# Patient Record
Sex: Female | Born: 1990
Health system: Southern US, Community
[De-identification: ages and names within clinical notes are randomized; demographics above are authoritative.]

## PROBLEM LIST (undated history)

## (undated) ENCOUNTER — Inpatient Hospital Stay (HOSPITAL_COMMUNITY): Payer: Self-pay

## (undated) DIAGNOSIS — L680 Hirsutism: Secondary | ICD-10-CM

## (undated) DIAGNOSIS — E249 Cushing's syndrome, unspecified: Secondary | ICD-10-CM

## (undated) DIAGNOSIS — E282 Polycystic ovarian syndrome: Secondary | ICD-10-CM

## (undated) DIAGNOSIS — A549 Gonococcal infection, unspecified: Secondary | ICD-10-CM

## (undated) HISTORY — PX: ABSCESS DRAINAGE: SHX1119

## (undated) HISTORY — DX: Hirsutism: L68.0

## (undated) HISTORY — PX: TOOTH EXTRACTION: SUR596

## (undated) HISTORY — PX: THERAPEUTIC ABORTION: SHX798

---
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
## 2009-10-31 ENCOUNTER — Emergency Department (HOSPITAL_COMMUNITY): Admission: EM | Admit: 2009-10-31 | Discharge: 2009-10-31 | Payer: Self-pay | Admitting: Emergency Medicine

## 2009-11-30 ENCOUNTER — Emergency Department (HOSPITAL_COMMUNITY): Admission: EM | Admit: 2009-11-30 | Discharge: 2009-11-30 | Payer: Self-pay | Admitting: Emergency Medicine

## 2010-08-02 ENCOUNTER — Emergency Department (HOSPITAL_COMMUNITY)
Admission: EM | Admit: 2010-08-02 | Discharge: 2010-08-02 | Payer: Self-pay | Source: Home / Self Care | Admitting: Emergency Medicine

## 2010-08-28 ENCOUNTER — Emergency Department (HOSPITAL_COMMUNITY)
Admission: EM | Admit: 2010-08-28 | Discharge: 2010-08-28 | Disposition: A | Discharge status: Home / Self Care | Payer: Self-pay | Attending: Emergency Medicine | Admitting: Emergency Medicine

## 2010-08-28 DIAGNOSIS — M549 Dorsalgia, unspecified: Secondary | ICD-10-CM | POA: Insufficient documentation

## 2010-08-28 DIAGNOSIS — E860 Dehydration: Secondary | ICD-10-CM | POA: Insufficient documentation

## 2010-08-28 DIAGNOSIS — R51 Headache: Secondary | ICD-10-CM | POA: Insufficient documentation

## 2010-08-28 DIAGNOSIS — E669 Obesity, unspecified: Secondary | ICD-10-CM | POA: Insufficient documentation

## 2010-08-28 LAB — URINALYSIS, ROUTINE W REFLEX MICROSCOPIC
Hgb urine dipstick: NEGATIVE
Nitrite: NEGATIVE
Specific Gravity, Urine: 1.029 (ref 1.005–1.030)
pH: 6 (ref 5.0–8.0)

## 2010-08-28 LAB — POCT PREGNANCY, URINE: Preg Test, Ur: NEGATIVE

## 2010-09-21 LAB — RAPID STREP SCREEN (MED CTR MEBANE ONLY): Streptococcus, Group A Screen (Direct): NEGATIVE

## 2010-09-21 LAB — STREP A DNA PROBE

## 2010-10-22 ENCOUNTER — Emergency Department (HOSPITAL_COMMUNITY)
Admission: EM | Admit: 2010-10-22 | Discharge: 2010-10-22 | Discharge status: Against Medical Advice | Payer: Self-pay | Attending: Emergency Medicine | Admitting: Emergency Medicine

## 2010-10-22 DIAGNOSIS — M549 Dorsalgia, unspecified: Secondary | ICD-10-CM | POA: Insufficient documentation

## 2010-10-22 DIAGNOSIS — M545 Low back pain, unspecified: Secondary | ICD-10-CM | POA: Insufficient documentation

## 2010-10-22 LAB — URINALYSIS, ROUTINE W REFLEX MICROSCOPIC
Bilirubin Urine: NEGATIVE
Glucose, UA: NEGATIVE mg/dL
Hgb urine dipstick: NEGATIVE
Ketones, ur: NEGATIVE mg/dL
Nitrite: NEGATIVE
pH: 6 (ref 5.0–8.0)

## 2010-10-22 LAB — POCT PREGNANCY, URINE: Preg Test, Ur: NEGATIVE

## 2010-11-17 NOTE — Op Note (Signed)
NAME:  EEVIE, LAPP               ACCOUNT NO.:  [id number]   MEDICAL RECORD NO.:  [id number]          PATIENT TYPE:  AMB   LOCATION:  DAY                          FACILITY:  [facility]   PHYSICIAN:  Anselm Pancoast. Weatherly, M.D.DATE OF BIRTH:  01-05-1991   DATE OF PROCEDURE:  05/02/2008  DATE OF DISCHARGE:                               OPERATIVE REPORT   PREOPERATIVE DIAGNOSIS:  Bilateral axillary hidradenitis, right worse  than left.   OPERATION:  Excision of bilateral hidradenitis with closure with Blake  drains.   ANESTHESIA:  General anesthesia.   SURGEON:  Dr. Anselm Pancoast. Weatherly.   HISTORY:  [name] [name] is an about 12 pound 20 year old black female  who I saw approximately a month ago.  She has had problems with drain  and hidradenitis on each axilla.  The area on the right has drained.  The area on the left has never actually drained, but has been tender and  swollen, and she has been on several rounds of antibiotics.  The last  culture that I did in the office screw a Proteus that was sensitive to  Keflex and basically all antibiotics.  I first saw her and she would not  let you do any type of I and D in the office, but I was able to get a  little culture that was spontaneously draining and this showed the  Proteus.  She has been on Septra.  The area has gotten better, but not  completely well and her mother desires that the area be completely  excised.  She is here today for the planned procedure.  I had her  scheduled for Tuesday, but because of an emergency case at [facility], I  had to cancel her and added her to the schedule for today.  She is  otherwise in good health with exception of being overweight.  She is not  a diabetic and her lab studies preoperatively showed a white count of  6800, hematocrit 36, and her sugar is normal.   DESCRIPTION OF PROCEDURE:  The patient preoperatively was given a gram  of Ancef and taken to the operative suite.  Induction of general  anesthesia, endotracheal tube.  We had her up with her arms extended  bilaterally.  She is difficult to position because of her size.  We  prepped both sides with Betadine solution and draped in a sterile  manner.  On the right, she has got 4-5 little spontaneously draining  areas with no actual big fluctuant areas.  On the left, there is an area  about the size of a walnut and then two smaller areas that look like  that it has drained in the past, but I have no history of exactly when  it actually had drained spontaneously in the past.  We started on the  right side.  I made sort of a smile incision.  The total length of the  incision was about 4 inches on the right, probably about 3 on the left,  and then with elevating the skin went ahead and excised all the palpable  area.  It is a deep incision, but it is only because of her size.  We  were not down in the true axillary contents, but just the skin and  subcutaneous tissue in the superficial areas.  Hemostasis was obtained  with cautery and also 4-0 Vicryl sutures.  After the area had been  completely excised, I then closed the skin after closing the  subcutaneous tissue with 4-0 Vicryl.  I placed a Blake drain that comes  out laterally and then closed the skin with mattress sutures of 3-0  nylon and then skin staples on the skin.  Will get the staples out in  the office next week.  The Blake drain was sutured to the skin with 3-0  nylon.  Next on the left side was similar, the incision is just a little  bit shorter and the area that was palpable was the size of a big walnut  area.  Then after it had been completely excised, there was a little  more vascularity on the left and more sutures of 3-0 Vicryl for  hemostasis, and then closed the skin in a similar manner with 4-0 Vicryl  subcutaneous.  Blake drain brought out laterally and then the skin  sutures, mattress of 3-0 nylon and then the staples.  Sterile occlusive  dressing  complied of Vaseline gauze, 4x4s and Hypafix was placed.  We  then opened the abscess on the back and there was probably an ounce of  frank pus that smells to me sort of like Proteus, which is what she has  cultured previously.  As far as on her antibiotics, if this is Proteus,  Keflex would probably be more sensitive.  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  We will pick the best antibiotic after we  see the results of this culture.  She will be released after a short  stay in the recovery room.  Her mother will be managing the drains.  She  was referred to [place] by [facility].           ______________________________  Anselm Pancoast. Zachery Dakins, M.D.     WJW/MEDQ  D:  05/02/2008  T:  05/02/2008  Job:  [id number]

## 2011-01-21 ENCOUNTER — Emergency Department (HOSPITAL_COMMUNITY)
Admission: EM | Admit: 2011-01-21 | Discharge: 2011-01-21 | Disposition: A | Discharge status: Home / Self Care | Payer: Self-pay | Attending: Emergency Medicine | Admitting: Emergency Medicine

## 2011-01-21 DIAGNOSIS — B9689 Other specified bacterial agents as the cause of diseases classified elsewhere: Secondary | ICD-10-CM | POA: Insufficient documentation

## 2011-01-21 DIAGNOSIS — N760 Acute vaginitis: Secondary | ICD-10-CM | POA: Insufficient documentation

## 2011-01-21 DIAGNOSIS — A499 Bacterial infection, unspecified: Secondary | ICD-10-CM | POA: Insufficient documentation

## 2011-01-21 LAB — URINALYSIS, ROUTINE W REFLEX MICROSCOPIC
Glucose, UA: NEGATIVE mg/dL
Leukocytes, UA: NEGATIVE
Nitrite: NEGATIVE
Specific Gravity, Urine: 1.014 (ref 1.005–1.030)
pH: 7.5 (ref 5.0–8.0)

## 2011-01-21 LAB — WET PREP, GENITAL
Trich, Wet Prep: NONE SEEN
Yeast Wet Prep HPF POC: NONE SEEN

## 2011-04-06 LAB — CBC
HCT: 36
MCHC: 33.7
MCV: 93.1
Platelets: 226

## 2011-04-06 LAB — CULTURE, ROUTINE-ABSCESS

## 2011-04-06 LAB — DIFFERENTIAL
Basophils Relative: 1
Eosinophils Absolute: 0.1
Eosinophils Relative: 2
Neutrophils Relative %: 57

## 2011-04-06 LAB — PREGNANCY, URINE: Preg Test, Ur: NEGATIVE

## 2011-04-09 LAB — RAPID STREP SCREEN (MED CTR MEBANE ONLY): Streptococcus, Group A Screen (Direct): NEGATIVE

## 2011-07-15 ENCOUNTER — Encounter (HOSPITAL_COMMUNITY): Payer: Self-pay | Admitting: *Deleted

## 2011-07-15 ENCOUNTER — Emergency Department (HOSPITAL_COMMUNITY)
Admission: EM | Admit: 2011-07-15 | Discharge: 2011-07-15 | Disposition: A | Discharge status: Home / Self Care | Payer: Self-pay | Attending: Emergency Medicine | Admitting: Emergency Medicine

## 2011-07-15 DIAGNOSIS — N390 Urinary tract infection, site not specified: Secondary | ICD-10-CM | POA: Insufficient documentation

## 2011-07-15 DIAGNOSIS — M545 Low back pain, unspecified: Secondary | ICD-10-CM | POA: Insufficient documentation

## 2011-07-15 LAB — URINALYSIS, ROUTINE W REFLEX MICROSCOPIC
Glucose, UA: NEGATIVE mg/dL
Ketones, ur: NEGATIVE mg/dL
Nitrite: NEGATIVE
Protein, ur: NEGATIVE mg/dL
pH: 6 (ref 5.0–8.0)

## 2011-07-15 LAB — URINE MICROSCOPIC-ADD ON

## 2011-07-15 LAB — POCT PREGNANCY, URINE: Preg Test, Ur: NEGATIVE

## 2011-07-15 MED ORDER — NAPROXEN 500 MG PO TABS: 500.0000 mg | ORAL_TABLET | Freq: Two times a day (BID) | ORAL | Status: DC

## 2011-07-15 MED ORDER — KETOROLAC TROMETHAMINE 60 MG/2ML IM SOLN
60.0000 mg | Freq: Once | INTRAMUSCULAR | Status: AC
  Administered 2011-07-15: 60 mg via INTRAMUSCULAR
  Filled 2011-07-15: qty 2

## 2011-07-15 MED ORDER — METHOCARBAMOL 500 MG PO TABS: 500.0000 mg | ORAL_TABLET | Freq: Two times a day (BID) | ORAL | Status: AC | PRN

## 2011-07-15 MED ORDER — CIPROFLOXACIN HCL 500 MG PO TABS: 500.0000 mg | ORAL_TABLET | Freq: Two times a day (BID) | ORAL | Status: AC

## 2011-07-15 NOTE — ED Provider Notes (Signed)
History     CSN: [id number]  Arrival date & time 07/15/11  0244   First MD Initiated Contact with Patient 07/15/11 0544      Chief Complaint  Patient presents with  . Back Pain    (Consider location/radiation/quality/duration/timing/severity/associated sxs/prior treatment) HPI Comments: 21 year old female with no significant past medical history presents after having approximately 3 days of gradual onset, persistent low back pain. This is worse when she bends over and worse when she tries to sit up straight but is able to ambulate without difficulty. She has no red flags for pathologic back pain including IV drug use, fevers, numbness weakness or incontinence. Symptoms are persistent, mild at this time. She has had over the counter medication such as Tylenol and ibuprofen prior to arrival with minimal relief.    Patient is a 21 y.o. female presenting with back pain. The history is provided by the patient.  Back Pain  Pertinent negatives include no fever, no numbness and no weakness.    History reviewed. No pertinent past medical history.  History reviewed. No pertinent past surgical history.  No family history on file.  History  Substance Use Topics  . Smoking status: Passive Smoker  . Smokeless tobacco: Not on file  . Alcohol Use: Yes     occasionally    OB History    Grav Para Term Preterm Abortions TAB SAB Ect Mult Living                  Review of Systems  Constitutional: Negative for fever and chills.  HENT: Negative for neck pain.   Gastrointestinal: Negative for nausea, vomiting and diarrhea.  Genitourinary: Negative for difficulty urinating.  Musculoskeletal: Positive for back pain.  Skin: Negative for rash.  Neurological: Negative for weakness and numbness.    Allergies  Review of patient's allergies indicates no known allergies.  Home Medications   Current Outpatient Rx  Name Route Sig Dispense Refill  . METHOCARBAMOL 500 MG PO TABS Oral Take 1  tablet (500 mg total) by mouth 2 (two) times daily as needed. 20 tablet 0  . NAPROXEN 500 MG PO TABS Oral Take 1 tablet (500 mg total) by mouth 2 (two) times daily with a meal. 30 tablet 0    BP 125/68  Pulse 95  Temp(Src) 99.5 F (37.5 C) (Oral)  Resp 16  LMP 03/06/2011  Physical Exam  Nursing note and vitals reviewed. Constitutional: She appears well-developed and well-nourished. No distress.  HENT:  Head: Normocephalic and atraumatic.  Eyes: Conjunctivae are normal. No scleral icterus.  Cardiovascular: Normal rate, regular rhythm and intact distal pulses.   Pulmonary/Chest: Effort normal and breath sounds normal.  Musculoskeletal: She exhibits tenderness ( Minimal tenderness in the lower back paraspinal muscles.). She exhibits no edema.  Neurological: She is alert.       Normal gait, reflexes of the lower extremities, sensation and motor of the lower extremities.  Skin: Skin is warm and dry. No rash noted. She is not diaphoretic.    ED Course  Procedures (including critical care time)  Labs Reviewed  URINALYSIS, ROUTINE W REFLEX MICROSCOPIC - Abnormal; Notable for the following:    Leukocytes, UA SMALL (*)    All other components within normal limits  URINE MICROSCOPIC-ADD ON - Abnormal; Notable for the following:    Squamous Epithelial / LPF FEW (*)    Bacteria, UA FEW (*)    All other components within normal limits  POCT PREGNANCY, URINE  POCT PREGNANCY, URINE  No results found.   1. Low back pain       MDM  Patient has ongoing mild low back pain, no significant abnormal vital signs,  also has what appears to be a mild urinary tract infection. I will treat for UTI, have patient followup as needed. Intramuscular Toradol given prior to discharge  Discharge prescriptions  #1 ciprofloxacin #2 Robaxin #3 Naprosyn  Patient appears stable for discharge        Vida Roller, MD 07/15/11 [phone]

## 2011-07-15 NOTE — ED Notes (Signed)
Pt states her middle/lower back started hurting 2-3 days ago. Denies nausea, vomiting with the pain. Denies burning or painful urination. Does admit to unprotected sex and was diagnosed with an STD back in July. However, pt states back pain feels different than when she was here in July. States pain is 10/10 at this time.

## 2011-07-15 NOTE — ED Notes (Signed)
Pt reports lower back pain x2-3 days - states she has been taking OTC pain medications w/o relief - pain is worse when she lays down or tries to get up. Pt in no acute distress on assessment. Pt denies any known mechanism of injury.

## 2011-09-20 ENCOUNTER — Emergency Department (HOSPITAL_COMMUNITY)
Admission: EM | Admit: 2011-09-20 | Discharge: 2011-09-20 | Disposition: A | Discharge status: Home / Self Care | Payer: BC Managed Care – PPO | Attending: Emergency Medicine | Admitting: Emergency Medicine

## 2011-09-20 ENCOUNTER — Encounter (HOSPITAL_COMMUNITY): Payer: Self-pay | Admitting: *Deleted

## 2011-09-20 DIAGNOSIS — L0291 Cutaneous abscess, unspecified: Secondary | ICD-10-CM

## 2011-09-20 DIAGNOSIS — N61 Mastitis without abscess: Secondary | ICD-10-CM | POA: Insufficient documentation

## 2011-09-20 MED ORDER — SULFAMETHOXAZOLE-TRIMETHOPRIM 800-160 MG PO TABS: 1.0000 | ORAL_TABLET | Freq: Two times a day (BID) | ORAL | Status: AC

## 2011-09-20 NOTE — Discharge Instructions (Signed)
WARM COMPRESSES TO THE AREA. TAKE ANTIBIOTICS AS PRESCRIBED. RETURN IN 48 HOURS FOR RECHECK AND ANY FURTHER NEEDED TREATMENT.   Abscess An abscess (boil or furuncle) is an infected area that contains a collection of pus.  SYMPTOMS Signs and symptoms of an abscess include pain, tenderness, redness, or hardness. You may feel a moveable soft area under your skin. An abscess can occur anywhere in the body.  TREATMENT  A surgical cut (incision) may be made over your abscess to drain the pus. Gauze may be packed into the space or a drain may be looped through the abscess cavity (pocket). This provides a drain that will allow the cavity to heal from the inside outwards. The abscess may be painful for a few days, but should feel much better if it was drained.  Your abscess, if seen early, may not have localized and may not have been drained. If not, another appointment may be required if it does not get better on its own or with medications. HOME CARE INSTRUCTIONS   Only take over-the-counter or prescription medicines for pain, discomfort, or fever as directed by your caregiver.   Take your antibiotics as directed if they were prescribed. Finish them even if you start to feel better.   Keep the skin and clothes clean around your abscess.   If the abscess was drained, you will need to use gauze dressing to collect any draining pus. Dressings will typically need to be changed 3 or more times a day.   The infection may spread by skin contact with others. Avoid skin contact as much as possible.   Practice good hygiene. This includes regular hand washing, cover any draining skin lesions, and do not share personal care items.   If you participate in sports, do not share athletic equipment, towels, whirlpools, or personal care items. Shower after every practice or tournament.   If a draining area cannot be adequately covered:   Do not participate in sports.   Children should not participate in day care  until the wound has healed or drainage stops.   If your caregiver has given you a follow-up appointment, it is very important to keep that appointment. Not keeping the appointment could result in a much worse infection, chronic or permanent injury, pain, and disability. If there is any problem keeping the appointment, you must call back to this facility for assistance.  SEEK MEDICAL CARE IF:   You develop increased pain, swelling, redness, drainage, or bleeding in the wound site.   You develop signs of generalized infection including muscle aches, chills, fever, or a general ill feeling.   You have an oral temperature above 102 F (38.9 C).  MAKE SURE YOU:   Understand these instructions.   Will watch your condition.   Will get help right away if you are not doing well or get worse.  Document Released: 03/31/2005 Document Revised: 06/10/2011 Document Reviewed: 01/23/2008 [facility] Patient Information 2012 [place], [place].

## 2011-09-20 NOTE — ED Provider Notes (Signed)
History     CSN: [id number]  Arrival date & time 09/20/11  1800   First MD Initiated Contact with Patient 09/20/11 2115      Chief Complaint  Patient presents with  . Abscess    (Consider location/radiation/quality/duration/timing/severity/associated sxs/prior treatment) Patient is a 21 y.o. female presenting with abscess. The history is provided by the patient.  Abscess  This is a new problem. The current episode started more than one week ago. Affected Location: Abscess to right breast that has intermittently opened and drained over two weeks. The abscess is characterized by painfulness and draining. Pertinent negatives include no fever. There were no sick contacts.    History reviewed. No pertinent past medical history.  History reviewed. No pertinent past surgical history.  No family history on file.  History  Substance Use Topics  . Smoking status: Passive Smoker  . Smokeless tobacco: Not on file  . Alcohol Use: Yes     occasionally    OB History    Grav Para Term Preterm Abortions TAB SAB Ect Mult Living                  Review of Systems  Constitutional: Negative for fever and chills.  HENT: Negative.   Respiratory: Negative.   Cardiovascular: Negative.   Gastrointestinal: Negative.  Negative for nausea.  Musculoskeletal: Negative.   Skin: Negative.        C/O Abscess.  Neurological: Negative.     Allergies  Review of patient's allergies indicates no known allergies.  Home Medications   Current Outpatient Rx  Name Route Sig Dispense Refill  . NAPROXEN 500 MG PO TABS Oral Take 1 tablet (500 mg total) by mouth 2 (two) times daily with a meal. 30 tablet 0    BP 119/70  Pulse 81  Temp(Src) 99.6 F (37.6 C) (Oral)  Resp 20  Wt 312 lb (141.522 kg)  SpO2 100%  LMP 07/16/2010  Physical Exam  Constitutional: She is oriented to person, place, and time. She appears well-developed and well-nourished.  Neck: Normal range of motion.  Pulmonary/Chest:  Effort normal.  Neurological: She is alert and oriented to person, place, and time.  Skin: Skin is warm and dry.       2cm circular area medial right breast at sternum with active drainage. Minimally tender. No induration.     ED Course  Procedures (including critical care time)  Labs Reviewed - No data to display No results found.   No diagnosis found.  1. Cutaneous abscess.    MDM  Site is opened and draining, minimal tenderness. Do not feel I&D would provide greater benefit. Abx, warm compresses and recheck if no better in 48 hours.        Rodena Medin, PA-C 09/20/11 2155

## 2011-09-20 NOTE — ED Notes (Signed)
Pt states she has an abscess on her right breast. Pt states abscess comes and goes. Redness and drainage noted to the abscess.

## 2011-09-20 NOTE — ED Notes (Signed)
Pt returned to window stating she had stepped outside.

## 2011-09-21 NOTE — ED Provider Notes (Signed)
Medical screening examination/treatment/procedure(s) were performed by non-physician practitioner and as supervising physician I was immediately available for consultation/collaboration. Devoria Albe, MD, FACEP   Ward Givens, MD 09/21/11 [phone]

## 2011-09-23 ENCOUNTER — Encounter (HOSPITAL_COMMUNITY): Payer: Self-pay | Admitting: Emergency Medicine

## 2011-09-23 ENCOUNTER — Emergency Department (HOSPITAL_COMMUNITY)
Admission: EM | Admit: 2011-09-23 | Discharge: 2011-09-23 | Disposition: A | Discharge status: Home / Self Care | Payer: BC Managed Care – PPO | Attending: Emergency Medicine | Admitting: Emergency Medicine

## 2011-09-23 DIAGNOSIS — F17200 Nicotine dependence, unspecified, uncomplicated: Secondary | ICD-10-CM | POA: Insufficient documentation

## 2011-09-23 DIAGNOSIS — K0889 Other specified disorders of teeth and supporting structures: Secondary | ICD-10-CM

## 2011-09-23 DIAGNOSIS — K089 Disorder of teeth and supporting structures, unspecified: Secondary | ICD-10-CM | POA: Insufficient documentation

## 2011-09-23 MED ORDER — PENICILLIN V POTASSIUM 500 MG PO TABS: 500.0000 mg | ORAL_TABLET | Freq: Four times a day (QID) | ORAL | Status: AC

## 2011-09-23 MED ORDER — OXYCODONE-ACETAMINOPHEN 5-325 MG PO TABS: 1.0000 | ORAL_TABLET | Freq: Four times a day (QID) | ORAL | Status: AC | PRN

## 2011-09-23 NOTE — ED Provider Notes (Signed)
Medical screening examination/treatment/procedure(s) were performed by non-physician practitioner and as supervising physician I was immediately available for consultation/collaboration.  Ethelda Chick, MD 09/23/11 [phone]

## 2011-09-23 NOTE — Discharge Instructions (Signed)
You have a dental injury. Use the resource guide listed below to help you find a dentist if you do not already have one to followup with. It is very important that you get evaluated by a dentist as soon as possible. Call tomorrow to schedule an appointment. Use your pain medication as prescribed and do not operate heavy machinery while on pain medication. Note that your pain medication contains acetaminophen (Tylenol) & its is not recommended that you use additional acetaminophen (Tylenol) while taking this medication. Take your full course of antibiotics. Read the instructions below. ° °Eat a soft or liquid diet and rinse your mouth out after meals with warm water. You should see a dentist or return here at once if you have increased swelling, increased pain or uncontrolled bleeding from the site of your injury. ° ° °SEEK MEDICAL CARE IF:  °· You have increased pain not controlled with medicines.  °· You have swelling around your tooth, in your face or neck.  °· You have bleeding which starts, continues, or gets worse.  °· You have a fever >101 °· If you are unable to open your mouth ° °RESOURCE GUIDE ° °Dental Problems ° °Patients with Medicaid: °[facility]                     [facility] °[address]                                           [address] °Phone:  [phone]                                                  Phone:  510-2600 ° °If unable to pay or uninsured, contact:  [facility] or [facility] to become qualified for the adult dental clinic. ° °Chronic Pain Problems °Contact [facility]  297-2271 °Patients need to be referred by their primary care doctor. ° °Insufficient Money for Medicine °Contact [employer]:  call "211" or [facility] 271-5999. ° °No Primary Care Doctor °Call [facility]  832-8000 °Other agencies that provide inexpensive medical care °   [facility]  [phone] °   [facility]  832-7272 °   [facility]  [phone] °   [facility]  832-4777 °   [facility]  373-0678 °   [facility]  272-1050 ° °Psychological Services °[facility]  [phone] °[facility]  [phone] °[facility]   800 853-5163 (emergency services 641-4993) ° °Substance Abuse Resources °[facility]  336-882-2125 °[facility] 336-784-9470 °[facility] 336-285-9073 °[facility] 336-845-3988 °Residential & Outpatient Substance Abuse Program  800-659-3381 ° °Abuse/Neglect °[place] [facility] (336) 641-3795 °[place] [facility] Hotline 800-378-5315 (After Hours) ° °Emergency Shelter °[facility] (336) 271-5985 ° °Maternity Homes °[facility] (336) 275-9566 °[facility] (704) 372-4663 ° °MRSA Hotline #:   832-7006 ° ° ° °[place] [facility] ° °[facility] [place]     [employer]                            [facility] °[address] [place]                       [address]      [address]  °[place]                                                [place]                            [place] °Phone:  349-3220                                   Phone:  [phone]                 Phone:  342-8140 ° °[facility] °Phone:  342-8316 ° °[place] [facility] Hotline °(336) 342-1394 °(336) 342-3537 (After Hours) ° ° ° ° °

## 2011-09-23 NOTE — ED Provider Notes (Signed)
History     CSN: [id number]  Arrival date & time 09/23/11  1448   First MD Initiated Contact with Patient 09/23/11 1508      Chief Complaint  Patient presents with  . Dental Injury    (Consider location/radiation/quality/duration/timing/severity/associated sxs/prior treatment) HPI Comments: Patient reports that her tooth had been broken for months.  Today when she was eating potato chips another piece of the tooth had broken off.  She is now having pain in that tooth.  Tooth pain located on the left upper molar tooth.  She describes the pain as a throbbing pain.  Patient is a 21 y.o. female presenting with dental injury. The history is provided by the patient.  Dental Injury This is a new problem. The current episode started today. The problem occurs constantly. Pertinent negatives include no chills, fever, neck pain, numbness or vomiting. Exacerbated by: palpation. She has tried nothing for the symptoms.    History reviewed. No pertinent past medical history.  History reviewed. No pertinent past surgical history.  No family history on file.  History  Substance Use Topics  . Smoking status: Passive Smoker  . Smokeless tobacco: Not on file  . Alcohol Use: Yes     occasionally    OB History    Grav Para Term Preterm Abortions TAB SAB Ect Mult Living                  Review of Systems  Constitutional: Negative for fever and chills.  HENT: Negative for facial swelling, drooling, trouble swallowing, neck pain and neck stiffness.   Gastrointestinal: Negative for vomiting.  Neurological: Negative for numbness.    Allergies  Review of patient's allergies indicates no known allergies.  Home Medications   Current Outpatient Rx  Name Route Sig Dispense Refill  . SULFAMETHOXAZOLE-TRIMETHOPRIM 800-160 MG PO TABS Oral Take 1 tablet by mouth every 12 (twelve) hours. 14 tablet 0    BP 123/77  Pulse 86  Temp(Src) 98.6 F (37 C) (Oral)  Resp 18  SpO2 95%  LMP  07/16/2010  Physical Exam  Nursing note and vitals reviewed. Constitutional: She appears well-developed and well-nourished. No distress.  HENT:  Head: Normocephalic and atraumatic. No trismus in the jaw.  Mouth/Throat: Uvula is midline, oropharynx is clear and moist and mucous membranes are normal. Abnormal dentition. No dental abscesses or uvula swelling. No oropharyngeal exudate, posterior oropharyngeal edema, posterior oropharyngeal erythema or tonsillar abscesses.         Poor dental hygiene. Pt able to open and close mouth with out difficulty. Airway intact. Uvula midline. Mild gingival swelling with tenderness over affected area, but no fluctuance. No swelling or tenderness of submental and submandibular regions. No facial swelling.  Eyes: Conjunctivae and EOM are normal.  Neck: Normal range of motion and full passive range of motion without pain. Neck supple.  Cardiovascular: Normal rate, regular rhythm and normal heart sounds.   Pulmonary/Chest: Effort normal and breath sounds normal. No respiratory distress. She has no wheezes.  Lymphadenopathy:       Head (right side): No submental, no submandibular, no tonsillar, no preauricular and no posterior auricular adenopathy present.       Head (left side): No submental, no submandibular, no tonsillar, no preauricular and no posterior auricular adenopathy present.    She has no cervical adenopathy.  Neurological: She is alert.  Skin: Skin is warm and dry. No rash noted. She is not diaphoretic.    ED Course  Procedures (including critical care  time)  Labs Reviewed - No data to display No results found.   1. Pain, dental       MDM  Patient with toothache.  No obvious distress.  No gross abscess.  Exam unconcerning for Ludwig's angina or spread of infection.  Will treat with penicillin and pain medicine.  Urged patient to follow-up with dentist.          Pascal Lux [place], PA-C 09/23/11 1554

## 2011-09-23 NOTE — ED Notes (Signed)
Pt was eating chips today and a tooth on her Upper R side that had a hole in it before broke off and now she is in pain because of this.

## 2011-11-29 ENCOUNTER — Emergency Department (HOSPITAL_COMMUNITY)
Admission: EM | Admit: 2011-11-29 | Discharge: 2011-11-29 | Disposition: A | Discharge status: Home / Self Care | Payer: BC Managed Care – PPO | Attending: Emergency Medicine | Admitting: Emergency Medicine

## 2011-11-29 ENCOUNTER — Encounter (HOSPITAL_COMMUNITY): Payer: Self-pay

## 2011-11-29 DIAGNOSIS — M545 Low back pain, unspecified: Secondary | ICD-10-CM | POA: Insufficient documentation

## 2011-11-29 DIAGNOSIS — E669 Obesity, unspecified: Secondary | ICD-10-CM | POA: Insufficient documentation

## 2011-11-29 DIAGNOSIS — N898 Other specified noninflammatory disorders of vagina: Secondary | ICD-10-CM

## 2011-11-29 DIAGNOSIS — A499 Bacterial infection, unspecified: Secondary | ICD-10-CM | POA: Insufficient documentation

## 2011-11-29 DIAGNOSIS — N760 Acute vaginitis: Secondary | ICD-10-CM | POA: Insufficient documentation

## 2011-11-29 DIAGNOSIS — B9689 Other specified bacterial agents as the cause of diseases classified elsewhere: Secondary | ICD-10-CM | POA: Insufficient documentation

## 2011-11-29 DIAGNOSIS — R109 Unspecified abdominal pain: Secondary | ICD-10-CM | POA: Insufficient documentation

## 2011-11-29 DIAGNOSIS — M549 Dorsalgia, unspecified: Secondary | ICD-10-CM

## 2011-11-29 LAB — URINALYSIS, ROUTINE W REFLEX MICROSCOPIC
Leukocytes, UA: NEGATIVE
Nitrite: NEGATIVE
Specific Gravity, Urine: 1.029 (ref 1.005–1.030)
Urobilinogen, UA: 2 mg/dL — ABNORMAL HIGH (ref 0.0–1.0)
pH: 6.5 (ref 5.0–8.0)

## 2011-11-29 LAB — COMPREHENSIVE METABOLIC PANEL
AST: 13 U/L (ref 0–37)
Albumin: 3.8 g/dL (ref 3.5–5.2)
Alkaline Phosphatase: 91 U/L (ref 39–117)
Chloride: 100 mEq/L (ref 96–112)
Potassium: 4.3 mEq/L (ref 3.5–5.1)
Sodium: 136 mEq/L (ref 135–145)
Total Bilirubin: 0.3 mg/dL (ref 0.3–1.2)
Total Protein: 8 g/dL (ref 6.0–8.3)

## 2011-11-29 LAB — CBC
HCT: 37.6 % (ref 36.0–46.0)
MCH: 30.7 pg (ref 26.0–34.0)
MCV: 93.1 fL (ref 78.0–100.0)
Platelets: 173 10*3/uL (ref 150–400)
RDW: 12.3 % (ref 11.5–15.5)

## 2011-11-29 LAB — WET PREP, GENITAL: Yeast Wet Prep HPF POC: NONE SEEN

## 2011-11-29 MED ORDER — NAPROXEN 500 MG PO TABS: 500.0000 mg | ORAL_TABLET | Freq: Two times a day (BID) | ORAL | Status: DC

## 2011-11-29 MED ORDER — KETOROLAC TROMETHAMINE 60 MG/2ML IM SOLN
60.0000 mg | Freq: Once | INTRAMUSCULAR | Status: AC
  Administered 2011-11-29: 60 mg via INTRAMUSCULAR
  Filled 2011-11-29: qty 2

## 2011-11-29 MED ORDER — METRONIDAZOLE 500 MG PO TABS: 500.0000 mg | ORAL_TABLET | Freq: Two times a day (BID) | ORAL | Status: AC

## 2011-11-29 NOTE — ED Notes (Signed)
Pt presents with no acute distress.  Rt flank pain and vaginal discharge for several days .  Denies pain with urination. Denies N/V/D and fever

## 2011-11-29 NOTE — Discharge Instructions (Signed)
Pelvic Infection ° °If you have been diagnosed with a pelvic infection such as a sexually transmitted disease, you will need to be treated with antibiotics. Please take the medicines as prescribed. Some of these tests do not come back for 1-2 days in which case if they turn positive you will receive a phone call to let you know. If you are contacted and do have an infection consistent with a sexually transmitted disease, then you will need to tell any and all sexual partners that you have had in the last 6 months no so that they can be tested and treated as well. If you should develop severe or worsening pain in your abdomen or the pelvis or develop severe fevers,nausea or vomiting that prevent you from taking your medications, return to the emergency department immediately. Otherwise contact your local physician or county health department for a follow up appointment to complete STD testing including HIV and syphilis.  See the list of phone numbers below. ° °RESOURCE GUIDE ° °Dental Problems ° °Patients with Medicaid: °[facility]                     [facility] °[address]                                           [address] °Phone:  [phone]                                                  Phone:  [phone] ° °If unable to pay or uninsured, contact:  [facility] or [facility] to become qualified for the adult dental clinic. ° °Chronic Pain Problems °Contact [facility]  [phone] °Patients need to be referred by their primary care doctor. ° °Insufficient Money for Medicine °Contact [employer]:  call "211" or [facility] 271-5999. ° °No Primary Care Doctor °Call [facility]  832-8000 °Other agencies that provide inexpensive medical care °   [facility]  [phone] °   [facility]  [phone] °   [facility]  [phone] °   [facility]  [phone] °   [facility]  [phone] ° [facility]  [phone] ° °Psychological Services °[facility]  [phone] °[facility]  [phone] °[facility]   [phone] (emergency services [phone]) ° °Substance Abuse Resources °[facility]  336-882-2125 °[facility] 336-784-9470 °[facility] 336-285-9073 °[facility] 336-845-3988 °Residential & Outpatient Substance Abuse Program  800-659-3381 ° °Abuse/Neglect °[place] [facility] (336) 641-3795 °[place] [facility] 800-378-5315 (After Hours) ° °Emergency Shelter °[facility] (336) 271-5985 ° °Maternity Homes °[facility] (336) 275-9566 °[facility] (704) 372-4663 ° °MRSA Hotline #:   832-7006 ° ° ° °[place] [facility] ° °[facility] [facility]     [employer]                          [facility] °[address] [place]                         [address]      [address]  °                                                [place]                            [place] °Phone:  [phone]                                   Phone:  [phone]                 Phone:  342-8140 ° °[facility] °Phone:  342-8316 ° °[place] [facility] °(336) 342-1394 °(336) 342-3537 (After Hours) ° ° ° °

## 2011-11-29 NOTE — ED Provider Notes (Signed)
History     CSN: [id number]  Arrival date & time 11/29/11  0003   First MD Initiated Contact with Patient 11/29/11 [phone]      Chief Complaint  Patient presents with  . Back Pain  . Flank Pain  . Vaginal Discharge    odor with clear discharge    (Consider location/radiation/quality/duration/timing/severity/associated sxs/prior treatment) HPI Comments: 21 year old female with history of obesity and intermittent back pain as well as a history of gonorrhea one year ago presents with 2 complaints  #1 back pain - 3 of waxing and waning back pain over the last several years, over the last 2 days she has had lower back pain which is nonspecific, not persistent, intermittent, worse with movement and palpation and not associated with red flags for bad back pain. She said no medication for this prior to arrival  #2 vaginal discharge - as noted approximately 4 days of increased vaginal discharge which is malodorous and light in color. She does have a sexual partner, is not using any protection and is not on any birth control. She denies fevers chills abdominal pain dysuria diarrhea.  Patient is a 21 y.o. female presenting with back pain, flank pain, and vaginal discharge. The history is provided by the patient.  Back Pain  Pertinent negatives include no fever and no dysuria.  Flank Pain  Vaginal Discharge    History reviewed. No pertinent past medical history.  History reviewed. No pertinent past surgical history.  No family history on file.  History  Substance Use Topics  . Smoking status: Passive Smoker  . Smokeless tobacco: Not on file  . Alcohol Use: Yes     occasionally    OB History    Grav Para Term Preterm Abortions TAB SAB Ect Mult Living                  Review of Systems  Constitutional: Negative for fever.  Gastrointestinal: Negative for nausea and vomiting.  Genitourinary: Positive for vaginal discharge. Negative for dysuria.  Musculoskeletal: Positive for  back pain.    Allergies  Review of patient's allergies indicates no known allergies.  Home Medications   Current Outpatient Rx  Name Route Sig Dispense Refill  . METRONIDAZOLE 500 MG PO TABS Oral Take 1 tablet (500 mg total) by mouth 2 (two) times daily. 14 tablet 0  . NAPROXEN 500 MG PO TABS Oral Take 1 tablet (500 mg total) by mouth 2 (two) times daily with a meal. 30 tablet 0    BP 121/70  Pulse 82  Temp(Src) 98.2 F (36.8 C) (Oral)  Resp 22  Ht 5\' 6"  (1.676 m)  Wt 318 lb (144.244 kg)  BMI 51.33 kg/m2  SpO2 99%  LMP 11/29/2011  Physical Exam  Nursing note and vitals reviewed. Constitutional: She appears well-developed and well-nourished. No distress.  HENT:  Head: Normocephalic and atraumatic.  Mouth/Throat: Oropharynx is clear and moist. No oropharyngeal exudate.  Eyes: Conjunctivae and EOM are normal. Pupils are equal, round, and reactive to light. Right eye exhibits no discharge. Left eye exhibits no discharge. No scleral icterus.  Neck: Normal range of motion. Neck supple. No JVD present. No thyromegaly present.  Cardiovascular: Normal rate, regular rhythm, normal heart sounds and intact distal pulses.  Exam reveals no gallop and no friction rub.   No murmur heard. Pulmonary/Chest: Effort normal and breath sounds normal. No respiratory distress. She has no wheezes. She has no rales.  Abdominal: Soft. Bowel sounds are normal. She exhibits no  distension and no mass. There is no tenderness.  Genitourinary:       Chaperone present for vaginal exam, no acute findings other than increased white colored vaginal discharge, cervix normal, no motion tenderness, no adnexal tenderness or masses, no bleeding,   Musculoskeletal: Normal range of motion. She exhibits no edema and no tenderness.  Lymphadenopathy:    She has no cervical adenopathy.  Neurological: She is alert. Coordination normal.  Skin: Skin is warm and dry. No rash noted. No erythema.  Psychiatric: She has a normal  mood and affect. Her behavior is normal.    ED Course  Procedures (including critical care time)  Labs Reviewed  URINALYSIS, ROUTINE W REFLEX MICROSCOPIC - Abnormal; Notable for the following:    APPearance CLOUDY (*)    Urobilinogen, UA 2.0 (*)    All other components within normal limits  COMPREHENSIVE METABOLIC PANEL - Abnormal; Notable for the following:    Glucose, Bld 221 (*)    All other components within normal limits  WET PREP, GENITAL - Abnormal; Notable for the following:    Clue Cells Wet Prep HPF POC FEW (*)    WBC, Wet Prep HPF POC FEW (*)    All other components within normal limits  CBC  POCT PREGNANCY, URINE  GC/CHLAMYDIA PROBE AMP, GENITAL   No results found.   1. Back pain   2. Vaginal Discharge   3. Bacterial vaginosis       MDM  Soft abdomen, normal vital signs, increased discharge, test for infection, urinalysis shows no signs of infection, not pregnant, normal labs otherwise appearance is benign. Patient informed to followup by phone for gonorrhea Chlamydia if positive.  Urinalysis is negative for infection, white blood cell 7.5, cooperative metabolic panel slight hyperglycemia otherwise normal and the wet prep shows clue cells but no signs of Trichomonas or yeast. Patient informed of need for followup, will discharge home with Flagyl and Naprosyn for back pain, red flags for back pain explained to the patient. The patient has expressed her understanding.  Discharge Prescriptions include:  Naprosyn Flagyl    Vida Roller, MD 11/29/11 [phone]

## 2011-11-29 NOTE — ED Notes (Signed)
Patient given discharge instructions, information, prescriptions, and diet order. Patient states that they adequately understand discharge information given and to return to ED if symptoms return or worsen.     

## 2011-11-29 NOTE — ED Notes (Signed)
Pt sts she has been having right flank pain for several months. Also c/o vaginal discharge starting 4 days ago. No pain, some odor. No new sexual partners or antibiotics. Sts no color to the discharge.

## 2011-11-30 LAB — GC/CHLAMYDIA PROBE AMP, GENITAL: Chlamydia, DNA Probe: NEGATIVE

## 2012-04-10 ENCOUNTER — Encounter (HOSPITAL_COMMUNITY): Payer: Self-pay | Admitting: *Deleted

## 2012-04-10 ENCOUNTER — Emergency Department (HOSPITAL_COMMUNITY)
Admission: EM | Admit: 2012-04-10 | Discharge: 2012-04-10 | Disposition: A | Discharge status: Home / Self Care | Payer: BC Managed Care – PPO | Attending: Emergency Medicine | Admitting: Emergency Medicine

## 2012-04-10 DIAGNOSIS — R739 Hyperglycemia, unspecified: Secondary | ICD-10-CM

## 2012-04-10 DIAGNOSIS — E119 Type 2 diabetes mellitus without complications: Secondary | ICD-10-CM | POA: Insufficient documentation

## 2012-04-10 DIAGNOSIS — R55 Syncope and collapse: Secondary | ICD-10-CM | POA: Insufficient documentation

## 2012-04-10 LAB — GLUCOSE, CAPILLARY
Glucose-Capillary: 401 mg/dL — ABNORMAL HIGH (ref 70–99)
Glucose-Capillary: 451 mg/dL — ABNORMAL HIGH (ref 70–99)

## 2012-04-10 LAB — COMPREHENSIVE METABOLIC PANEL
ALT: 35 U/L (ref 0–35)
AST: 21 U/L (ref 0–37)
Albumin: 3.8 g/dL (ref 3.5–5.2)
Alkaline Phosphatase: 77 U/L (ref 39–117)
CO2: 23 mEq/L (ref 19–32)
Chloride: 96 mEq/L (ref 96–112)
GFR calc non Af Amer: >90 mL/min (ref >90)
Potassium: 3.8 mEq/L (ref 3.5–5.1)
Total Bilirubin: 0.3 mg/dL (ref 0.3–1.2)

## 2012-04-10 LAB — CBC WITH DIFFERENTIAL/PLATELET
Basophils Absolute: 0 10*3/uL (ref 0.0–0.1)
Basophils Relative: 0 % (ref 0–1)
HCT: 35.1 % — ABNORMAL LOW (ref 36.0–46.0)
Hemoglobin: 12.1 g/dL (ref 12.0–15.0)
Lymphocytes Relative: 28 % (ref 12–46)
MCHC: 34.5 g/dL (ref 30.0–36.0)
Monocytes Absolute: 0.4 10*3/uL (ref 0.1–1.0)
Neutro Abs: 4.2 10*3/uL (ref 1.7–7.7)
Neutrophils Relative %: 63 % (ref 43–77)
RDW: 12.3 % (ref 11.5–15.5)
WBC: 6.6 10*3/uL (ref 4.0–10.5)

## 2012-04-10 LAB — URINALYSIS, ROUTINE W REFLEX MICROSCOPIC
Bilirubin Urine: NEGATIVE
Glucose, UA: >1000 mg/dL — AB
Ketones, ur: NEGATIVE mg/dL
Leukocytes, UA: NEGATIVE
Protein, ur: NEGATIVE mg/dL
pH: 6 (ref 5.0–8.0)

## 2012-04-10 LAB — URINE MICROSCOPIC-ADD ON

## 2012-04-10 MED ORDER — SODIUM CHLORIDE 0.9 % IV BOLUS (SEPSIS)
1000.0000 mL | Freq: Once | INTRAVENOUS | Status: AC
  Administered 2012-04-10: 1000 mL via INTRAVENOUS

## 2012-04-10 MED ORDER — INSULIN ASPART 100 UNIT/ML ~~LOC~~ SOLN
12.0000 [IU] | Freq: Once | SUBCUTANEOUS | Status: AC
  Administered 2012-04-10: 12 [IU] via SUBCUTANEOUS
  Filled 2012-04-10: qty 1

## 2012-04-10 MED ORDER — METFORMIN HCL 500 MG PO TABS
500.0000 mg | ORAL_TABLET | Freq: Once | ORAL | Status: AC
  Administered 2012-04-10: 500 mg via ORAL
  Filled 2012-04-10: qty 1

## 2012-04-10 MED ORDER — METFORMIN HCL 500 MG PO TABS: 500.0000 mg | ORAL_TABLET | Freq: Two times a day (BID) | ORAL | Status: DC

## 2012-04-10 MED ORDER — INSULIN ASPART 100 UNIT/ML ~~LOC~~ SOLN
10.0000 [IU] | Freq: Once | SUBCUTANEOUS | Status: AC
  Administered 2012-04-10: 10 [IU] via INTRAVENOUS
  Filled 2012-04-10: qty 1

## 2012-04-10 MED ORDER — INSULIN ASPART 100 UNIT/ML ~~LOC~~ SOLN
6.0000 [IU] | Freq: Once | SUBCUTANEOUS | Status: AC
  Administered 2012-04-10: 6 [IU] via SUBCUTANEOUS

## 2012-04-10 MED ORDER — INSULIN ASPART 100 UNIT/ML ~~LOC~~ SOLN
SUBCUTANEOUS | Status: AC
  Filled 2012-04-10: qty 1

## 2012-04-10 NOTE — ED Notes (Signed)
Pt c/o feeling over heated after work, near syncope, and dry-mouth. Pt is new-onset diabetic, dx x 5 days ago and has not seen a PCP yet, scheduled appt in 2 days. Pt presents to ED w/ additional c/o numbness in hands and feet and is hyperglycemic.

## 2012-04-10 NOTE — ED Provider Notes (Signed)
History     CSN: [id number]  Arrival date & time 04/10/12  0046   First MD Initiated Contact with Patient 04/10/12 0126      Chief Complaint  Patient presents with  . Near Syncope  . Hyperglycemia    (Consider location/radiation/quality/duration/timing/severity/associated sxs/prior treatment) HPI 21 year old female presents to emergency department with near syncope this evening. Patient reports when she got home from work she felt hot, took a cool shower but still felt hot afterwards, and felt like she was going to pass out. Patient was recently told that she was a diabetic. She was seen at her OB/GYN office earlier in the week, had blood work done. She was called at home and told that should her blood glucose was "very high" and followup with her primary care Dr. She has followup on Tuesday. She has not started any medication or started on a diabetic diet. She reports dry mouth, thirst and increased urination. She complains of numbness and tingling to her hands and feet. No family history of diabetes. Patient denies any recent weight loss or illnesses.  Past Medical History  Diagnosis Date  . Diabetes mellitus     Past Surgical History  Procedure Date  . Abscess drainage     History reviewed. No pertinent family history.  History  Substance Use Topics  . Smoking status: Passive Smoke Exposure - Never Smoker    Types: Cigarettes  . Smokeless tobacco: Not on file  . Alcohol Use: Yes     occasionally    OB History    Grav Para Term Preterm Abortions TAB SAB Ect Mult Living                  Review of Systems  All other systems reviewed and are negative.    Allergies  Review of patient's allergies indicates no known allergies.  Home Medications   Current Outpatient Rx  Name Route Sig Dispense Refill  . NAPROXEN 500 MG PO TABS Oral Take 1 tablet (500 mg total) by mouth 2 (two) times daily with a meal. 30 tablet 0    BP 121/69  Pulse 69  Temp 98.8 F (37.1 C)   Resp 20  SpO2 98%  LMP 03/19/2012  Physical Exam  Nursing note and vitals reviewed. Constitutional: She is oriented to person, place, and time. She appears well-developed and well-nourished.  HENT:  Head: Normocephalic and atraumatic.  Nose: Nose normal.  Mouth/Throat: Oropharynx is clear and moist.  Eyes: Conjunctivae normal and EOM are normal. Pupils are equal, round, and reactive to light.  Neck: Normal range of motion. Neck supple. No JVD present. No tracheal deviation present. No thyromegaly present.  Cardiovascular: Normal rate, regular rhythm, normal heart sounds and intact distal pulses.  Exam reveals no gallop and no friction rub.   No murmur heard. Pulmonary/Chest: Effort normal and breath sounds normal. No stridor. No respiratory distress. She has no wheezes. She has no rales. She exhibits no tenderness.  Abdominal: Soft. Bowel sounds are normal. She exhibits no distension and no mass. There is no tenderness. There is no rebound and no guarding.  Musculoskeletal: Normal range of motion. She exhibits no edema and no tenderness.  Lymphadenopathy:    She has no cervical adenopathy.  Neurological: She is alert and oriented to person, place, and time. She has normal reflexes. She exhibits normal muscle tone. Coordination normal.  Skin: Skin is warm and dry. No rash noted. No erythema. No pallor.  Psychiatric: She has a normal mood  and affect. Her behavior is normal. Judgment and thought content normal.    ED Course  Procedures (including critical care time)  Labs Reviewed  CBC WITH DIFFERENTIAL - Abnormal; Notable for the following:    HCT 35.1 (*)     All other components within normal limits  COMPREHENSIVE METABOLIC PANEL - Abnormal; Notable for the following:    Sodium 132 (*)     Glucose, Bld 501 (*)     All other components within normal limits  URINALYSIS, ROUTINE W REFLEX MICROSCOPIC - Abnormal; Notable for the following:    Specific Gravity, Urine 1.036 (*)      Glucose, UA >1000 (*)     Hgb urine dipstick TRACE (*)     All other components within normal limits  GLUCOSE, CAPILLARY - Abnormal; Notable for the following:    Glucose-Capillary 496 (*)     All other components within normal limits  GLUCOSE, CAPILLARY - Abnormal; Notable for the following:    Glucose-Capillary 461 (*)     All other components within normal limits  GLUCOSE, CAPILLARY - Abnormal; Notable for the following:    Glucose-Capillary 401 (*)     All other components within normal limits  GLUCOSE, CAPILLARY - Abnormal; Notable for the following:    Glucose-Capillary 366 (*)     All other components within normal limits  GLUCOSE, CAPILLARY - Abnormal; Notable for the following:    Glucose-Capillary 228 (*)     All other components within normal limits  PREGNANCY, URINE  URINE MICROSCOPIC-ADD ON   No results found.   1. Hyperglycemia without ketosis   2. Diabetes mellitus       MDM  21 yo female with recent dx of diabetes, now with near syncope.  No signs of DKA.  Will give fluid bolus, check orthostatics, start metformin.        Olivia Mackie, MD 04/10/12 [phone]

## 2012-04-10 NOTE — ED Notes (Signed)
Pt reports experiencing bilat hand and foot numbness after getting out of the shower, pt states at that time "she also felt like she was going to pass out." Pt denies syncopal episode or dizziness at present. Pt recently dx w/ diabetes by OBGYN and has follow up app scheduled w/ PCP to address new dx. Pt denies any pain or other associating symptoms at present. Pt in no acute distress, resting comfortably on bed - tearful and admits it's d/t being in the hospital. Pt A&Ox4

## 2012-06-13 DIAGNOSIS — E119 Type 2 diabetes mellitus without complications: Secondary | ICD-10-CM | POA: Insufficient documentation

## 2012-06-13 DIAGNOSIS — K089 Disorder of teeth and supporting structures, unspecified: Secondary | ICD-10-CM | POA: Insufficient documentation

## 2012-06-14 ENCOUNTER — Encounter (HOSPITAL_COMMUNITY): Payer: Self-pay | Admitting: Family Medicine

## 2012-06-14 ENCOUNTER — Emergency Department (HOSPITAL_COMMUNITY)
Admission: EM | Admit: 2012-06-14 | Discharge: 2012-06-14 | Discharge status: Against Medical Advice | Payer: BC Managed Care – PPO | Attending: Emergency Medicine | Admitting: Emergency Medicine

## 2012-06-14 NOTE — ED Notes (Signed)
Patient states that she has had a toothache for a week. States she has been taken Advil without relief of pain.

## 2012-06-22 ENCOUNTER — Encounter (HOSPITAL_COMMUNITY): Payer: Self-pay | Admitting: Emergency Medicine

## 2012-06-22 ENCOUNTER — Emergency Department (HOSPITAL_COMMUNITY)
Admission: EM | Admit: 2012-06-22 | Discharge: 2012-06-22 | Disposition: A | Discharge status: Home / Self Care | Payer: BC Managed Care – PPO | Attending: Emergency Medicine | Admitting: Emergency Medicine

## 2012-06-22 DIAGNOSIS — E119 Type 2 diabetes mellitus without complications: Secondary | ICD-10-CM | POA: Insufficient documentation

## 2012-06-22 DIAGNOSIS — K089 Disorder of teeth and supporting structures, unspecified: Secondary | ICD-10-CM | POA: Insufficient documentation

## 2012-06-22 DIAGNOSIS — K0889 Other specified disorders of teeth and supporting structures: Secondary | ICD-10-CM

## 2012-06-22 DIAGNOSIS — Z79899 Other long term (current) drug therapy: Secondary | ICD-10-CM | POA: Insufficient documentation

## 2012-06-22 MED ORDER — HYDROCODONE-ACETAMINOPHEN 5-325 MG PO TABS: 1.0000 | ORAL_TABLET | ORAL | Status: DC | PRN

## 2012-06-22 MED ORDER — PENICILLIN V POTASSIUM 500 MG PO TABS: 500.0000 mg | ORAL_TABLET | Freq: Three times a day (TID) | ORAL | Status: DC

## 2012-06-22 NOTE — ED Provider Notes (Signed)
Medical screening examination/treatment/procedure(s) were performed by non-physician practitioner and as supervising physician I was immediately available for consultation/collaboration. Devoria Albe, MD, [facility]   Ward Givens, MD 06/22/12 1520

## 2012-06-22 NOTE — ED Provider Notes (Signed)
History     CSN: [id number]  Arrival date & time 06/22/12  1217   First MD Initiated Contact with Patient 06/22/12 1442      Chief Complaint  Patient presents with  . Dental Pain    (Consider location/radiation/quality/duration/timing/severity/associated sxs/prior treatment) Patient is a 21 y.o. female presenting with tooth pain. The history is provided by the patient.  Dental PainThe primary symptoms include mouth pain. Primary symptoms do not include fever.  Additional symptoms do not include: facial swelling and trouble swallowing. Associated symptoms comments: Broken or decayed tooth for a long time, painful for 2-3 days. No facial swelling, fever, or drainage..    Past Medical History  Diagnosis Date  . Diabetes mellitus     Past Surgical History  Procedure Date  . Abscess drainage     History reviewed. No pertinent family history.  History  Substance Use Topics  . Smoking status: Passive Smoke Exposure - Never Smoker    Types: Cigarettes  . Smokeless tobacco: Not on file  . Alcohol Use: No    OB History    Grav Para Term Preterm Abortions TAB SAB Ect Mult Living                  Review of Systems  Constitutional: Negative for fever and chills.  HENT: Positive for dental problem. Negative for facial swelling and trouble swallowing.   Gastrointestinal: Negative.  Negative for nausea.    Allergies  Review of patient's allergies indicates no known allergies.  Home Medications   Current Outpatient Rx  Name  Route  Sig  Dispense  Refill  . GLIMEPIRIDE 4 MG PO TABS   Oral   Take 4 mg by mouth daily before breakfast.         . IBUPROFEN 200 MG PO TABS   Oral   Take 400 mg by mouth every 8 (eight) hours as needed. For pain.         Marland Kitchen METFORMIN HCL 500 MG PO TABS   Oral   Take 1 tablet (500 mg total) by mouth 2 (two) times daily with a meal.   30 tablet   0   . NORGESTIMATE-ETH ESTRADIOL 0.25-35 MG-MCG PO TABS   Oral   Take 1 tablet by mouth  daily.           BP 117/71  Pulse 73  Temp 98.7 F (37.1 C) (Oral)  Resp 18  Ht 5\' 7"  (1.702 m)  Wt 293 lb 9.6 oz (133.176 kg)  BMI 45.98 kg/m2  SpO2 97%  LMP 03/06/2012  Physical Exam  Constitutional: She is oriented to person, place, and time. She appears well-developed and well-nourished.  HENT:       Generally good dentition with cavity at lower 1st molar. No significant gum swelling or irritation. No active drainage.   Neck: Normal range of motion.  Pulmonary/Chest: Effort normal.  Neurological: She is alert and oriented to person, place, and time.  Skin: Skin is warm and dry.    ED Course  Procedures (including critical care time)  Labs Reviewed - No data to display No results found.   No diagnosis found.  1. toothache  MDM  Recommended dental follow up, use of dental wax        Rodena Medin, PA-C 06/22/12 1517

## 2012-06-22 NOTE — ED Notes (Addendum)
Patient left AMA last week for the same because she wasn't being seen in time.  Patient has a toothache in her second to last molar on the bottom left side that she has had for the last month.  Patient denies fevers.

## 2012-09-05 ENCOUNTER — Encounter (HOSPITAL_COMMUNITY): Payer: Self-pay | Admitting: *Deleted

## 2012-09-05 ENCOUNTER — Inpatient Hospital Stay (HOSPITAL_COMMUNITY): Payer: BC Managed Care – PPO

## 2012-09-05 ENCOUNTER — Inpatient Hospital Stay (HOSPITAL_COMMUNITY)
Admission: AD | Admit: 2012-09-05 | Discharge: 2012-09-05 | Disposition: A | Discharge status: Home / Self Care | Payer: BC Managed Care – PPO | Source: Ambulatory Visit | Attending: Obstetrics | Admitting: Obstetrics

## 2012-09-05 DIAGNOSIS — O4690 Antepartum hemorrhage, unspecified, unspecified trimester: Secondary | ICD-10-CM

## 2012-09-05 DIAGNOSIS — E119 Type 2 diabetes mellitus without complications: Secondary | ICD-10-CM | POA: Insufficient documentation

## 2012-09-05 DIAGNOSIS — O209 Hemorrhage in early pregnancy, unspecified: Secondary | ICD-10-CM | POA: Insufficient documentation

## 2012-09-05 DIAGNOSIS — E669 Obesity, unspecified: Secondary | ICD-10-CM | POA: Insufficient documentation

## 2012-09-05 DIAGNOSIS — O24919 Unspecified diabetes mellitus in pregnancy, unspecified trimester: Secondary | ICD-10-CM | POA: Insufficient documentation

## 2012-09-05 HISTORY — DX: Polycystic ovarian syndrome: E28.2

## 2012-09-05 HISTORY — DX: Cushing's syndrome, unspecified: E24.9

## 2012-09-05 HISTORY — DX: Gonococcal infection, unspecified: A54.9

## 2012-09-05 LAB — CBC
HCT: 35.6 % — ABNORMAL LOW (ref 36.0–46.0)
Hemoglobin: 11.8 g/dL — ABNORMAL LOW (ref 12.0–15.0)
MCH: 30.4 pg (ref 26.0–34.0)
MCHC: 33.1 g/dL (ref 30.0–36.0)
MCV: 91.8 fL (ref 78.0–100.0)
Platelets: 244 10*3/uL (ref 150–400)
RBC: 3.88 MIL/uL (ref 3.87–5.11)
RDW: 13.8 % (ref 11.5–15.5)
WBC: 6.5 10*3/uL (ref 4.0–10.5)

## 2012-09-05 LAB — HCG, SERUM, QUALITATIVE: Preg, Serum: POSITIVE — AB

## 2012-09-05 LAB — HCG, QUANTITATIVE, PREGNANCY: hCG, Beta Chain, Quant, S: 1968 m[IU]/mL — ABNORMAL HIGH (ref <5)

## 2012-09-05 LAB — ABO/RH: ABO/RH(D): O POS

## 2012-09-05 NOTE — MAU Provider Note (Signed)
History   Pt is a G0 with c/o onset heavy vaginal bleeding early this am.  Has been off COC's since 05/2012, due to non compliant dosing behavior and irregular menses.  No birth control.  Denies fever, dysuria.  Mild to mod low abd cramping.  Hx DM diagnosed 04/2012.  Currently being followed by endo and last visit on 08/23/12.  Metformin dose decreased.  CSN: [id number]  Arrival date and time: 09/05/12 1140    Chief Complaint  Patient presents with  . Vaginal Bleeding   HPI  Morbid obesity, DM, PCOS, and abnormal vaginal bleeding.    Past Medical History  Diagnosis Date  . Diabetes mellitus   . Gonorrhea     Past Surgical History  Procedure Laterality Date  . Abscess drainage    . Tooth extraction      History reviewed. No pertinent family history.  History  Substance Use Topics  . Smoking status: Current Some Day Smoker    Types: Cigarettes  . Smokeless tobacco: Never Used  . Alcohol Use: No    Allergies: No Known Allergies  Prescriptions prior to admission  Medication Sig Dispense Refill  . glimepiride (AMARYL) 4 MG tablet Take 4 mg by mouth daily before breakfast.      . metFORMIN (GLUCOPHAGE) 500 MG tablet Take 1 tablet (500 mg total) by mouth 2 (two) times daily with a meal.  30 tablet  0  . ibuprofen (ADVIL,MOTRIN) 200 MG tablet Take 400 mg by mouth every 8 (eight) hours as needed. For pain.        ROS Physical Exam   Blood pressure 118/64, pulse 93, temperature 98.2 F (36.8 C), temperature source Oral, resp. rate 20, height 5\' 5"  (1.651 m), weight 131.09 kg (289 lb).  Physical Exam General:  A and O x 3 HEENT: Normocephalic LUNGS: CTA HEART:  RRR ABD: morbidly obese  GYN EXAM: Speculum exam: mod blood noted in vault; cervix slightly open and small amt tissue removed from os and to pathology Cervix: soft, long Uterus: unable to assess size d/t habitus; tender on exam   History reviewed. No pertinent family history.  Social History:   reports that she has been smoking Cigarettes.  She has been smoking about 0.00 packs per day. She has never used smokeless tobacco. She reports that she does not drink alcohol or use illicit drugs.     Results for orders placed during the hospital encounter of 09/05/12 (from the past 48 hour(s))  CBC     Status: Abnormal   Collection Time    09/05/12 12:42 PM      Result Value Range   WBC 6.5  4.0 - 10.5 K/uL   RBC 3.88  3.87 - 5.11 MIL/uL   Hemoglobin 11.8 (*) 12.0 - 15.0 g/dL   HCT 40.9 (*) 81.1 - 91.4 %   MCV 91.8  78.0 - 100.0 fL   MCH 30.4  26.0 - 34.0 pg   MCHC 33.1  30.0 - 36.0 g/dL   RDW 78.2  95.6 - 21.3 %   Platelets 244  150 - 400 K/uL  HCG, SERUM, QUALITATIVE     Status: Abnormal   Collection Time    09/05/12 12:42 PM      Result Value Range   Preg, Serum POSITIVE (*) NEGATIVE   Comment:            THE SENSITIVITY OF THIS     METHODOLOGY IS >10 mIU/mL.     Blood pressure 118/64,  pulse 93, temperature 98.2 F (36.8 C), temperature source Oral, resp. rate 20, height 5\' 5"  (1.651 m), weight 131.09 kg (289 lb).   Assessment/Plan  Pregnant, bleeding.  Quant pending.  To u/s. DM, on metformin Morbid Obesity  [name] [name] K 09/05/2012, 1:50 PM     MAU Course  Procedures see above   Assessment and Plan

## 2012-09-05 NOTE — MAU Note (Signed)
This morning woke up, bleeding really heavy.  Passing large clots, lots of cramping.  Changed 6 times in last 2 hrs.

## 2012-09-05 NOTE — MAU Note (Signed)
Name and DOB verified.  Pt confirms spelling of name is correct on armband.

## 2012-09-05 NOTE — MAU Note (Signed)
No period since Nov.  Was told diabetic. Hx of irreg cycles. Was put on birthcontrol to stop/control bleeding; stopped when was having no bleeding at all. ( November)

## 2012-09-05 NOTE — MAU Provider Note (Signed)
Assessment/Plan:   U/S: no IUP or adnexal mass identified; DDx includes early IUP, too early to visualize, SAB, Ectopic.   Pain fever and bleeding precautions reviewed Ibuprofen prn for pain  Quant Bhcg pending Rh: Pos and CBC stable.  VSS.  Return to office on Thursday, 10/08/12 for labs and evaluation.  Will have office call to confirm time.   Arlana Lindau, [facility] 09/05/12 2:55pm

## 2012-09-06 LAB — GC/CHLAMYDIA PROBE AMP
CT Probe RNA: NEGATIVE
GC Probe RNA: NEGATIVE

## 2012-11-08 ENCOUNTER — Encounter (HOSPITAL_COMMUNITY): Payer: Self-pay

## 2012-11-08 ENCOUNTER — Emergency Department (HOSPITAL_COMMUNITY)
Admission: EM | Admit: 2012-11-08 | Discharge: 2012-11-08 | Disposition: A | Discharge status: Home / Self Care | Payer: BC Managed Care – PPO | Attending: Emergency Medicine | Admitting: Emergency Medicine

## 2012-11-08 DIAGNOSIS — E1169 Type 2 diabetes mellitus with other specified complication: Secondary | ICD-10-CM | POA: Insufficient documentation

## 2012-11-08 DIAGNOSIS — Z3202 Encounter for pregnancy test, result negative: Secondary | ICD-10-CM | POA: Insufficient documentation

## 2012-11-08 DIAGNOSIS — Z8742 Personal history of other diseases of the female genital tract: Secondary | ICD-10-CM | POA: Insufficient documentation

## 2012-11-08 DIAGNOSIS — R739 Hyperglycemia, unspecified: Secondary | ICD-10-CM

## 2012-11-08 DIAGNOSIS — F17200 Nicotine dependence, unspecified, uncomplicated: Secondary | ICD-10-CM | POA: Insufficient documentation

## 2012-11-08 DIAGNOSIS — Z8619 Personal history of other infectious and parasitic diseases: Secondary | ICD-10-CM | POA: Insufficient documentation

## 2012-11-08 LAB — POCT I-STAT, CHEM 8
BUN: 10 mg/dL (ref 6–23)
Chloride: 105 mEq/L (ref 96–112)
Creatinine, Ser: 0.6 mg/dL (ref 0.50–1.10)
Sodium: 140 mEq/L (ref 135–145)
TCO2: 24 mmol/L (ref 0–100)

## 2012-11-08 LAB — URINALYSIS, ROUTINE W REFLEX MICROSCOPIC
Glucose, UA: 250 mg/dL — AB
Hgb urine dipstick: NEGATIVE
Ketones, ur: NEGATIVE mg/dL
Protein, ur: NEGATIVE mg/dL
pH: 6 (ref 5.0–8.0)

## 2012-11-08 LAB — POCT PREGNANCY, URINE: Preg Test, Ur: NEGATIVE

## 2012-11-08 NOTE — ED Provider Notes (Signed)
Medical screening examination/treatment/procedure(s) were performed by non-physician practitioner and as supervising physician I was immediately available for consultation/collaboration.  Tamecka Milham M Samier Jaco, MD 11/08/12 0748 

## 2012-11-08 NOTE — ED Notes (Signed)
Pt states recently taken off dm meds (metformin and one other med).  Came off meds late February b/c md states she did not need them.  States she checked her cbg at home b/c she felt it was high.  Meter registered 400's.

## 2012-11-08 NOTE — ED Provider Notes (Signed)
History     CSN: [id number]  Arrival date & time 11/08/12  0116   First MD Initiated Contact with Patient 11/08/12 0400      Chief Complaint  Patient presents with  . Hyperglycemia   HPI  History provided by the patient. Patient is a 22 year old female with history of PCO S., Cushing syndrome, diabetes who presents with concerns for elevated blood sugar. Patient states she was previously taking metformin for blood sugar but had significant improvements and her PCP stopped her from taking this several months ago. She has not noticed any significant problems since that time until earlier today she was checking her blood sugar and her meter was reading very high above 400. Patient was concerned this evening and came for further evaluation. She denies having any other associated symptoms. No fever, chills or sweats. No dysuria or urinary frequency or polydipsia. Denies any night sweats or weight changes.    Past Medical History  Diagnosis Date  . Diabetes mellitus   . Gonorrhea   . PCOS (polycystic ovarian syndrome)   . Cushing syndrome     Past Surgical History  Procedure Laterality Date  . Abscess drainage    . Tooth extraction      History reviewed. No pertinent family history.  History  Substance Use Topics  . Smoking status: Current Some Day Smoker    Types: Cigarettes  . Smokeless tobacco: Never Used  . Alcohol Use: No    OB History   Grav Para Term Preterm Abortions TAB SAB Ect Mult Living   1               Review of Systems  Constitutional: Negative for fever.  Genitourinary: Negative for dysuria, frequency, hematuria and flank pain.  All other systems reviewed and are negative.    Allergies  Review of patient's allergies indicates no known allergies.  Home Medications  No current outpatient prescriptions on file.  BP 141/80  Pulse 99  Temp(Src) 99.2 F (37.3 C) (Oral)  Resp 18  SpO2 96%  LMP 07/09/2012  Breastfeeding? Unknown  Physical Exam   Nursing note and vitals reviewed. Constitutional: She is oriented to person, place, and time. She appears well-developed and well-nourished. No distress.  HENT:  Head: Normocephalic.  Cardiovascular: Normal rate and regular rhythm.   Pulmonary/Chest: Effort normal and breath sounds normal.  Abdominal: Soft.  Neurological: She is alert and oriented to person, place, and time.  Skin: Skin is warm and dry. No rash noted.  Psychiatric: She has a normal mood and affect. Her behavior is normal.    ED Course  Procedures   Results for orders placed during the hospital encounter of 11/08/12  GLUCOSE, CAPILLARY      Result Value Range   Glucose-Capillary 167 (*) 70 - 99 mg/dL  URINALYSIS, ROUTINE W REFLEX MICROSCOPIC      Result Value Range   Color, Urine YELLOW  YELLOW   APPearance CLOUDY (*) CLEAR   Specific Gravity, Urine 1.033 (*) 1.005 - 1.030   pH 6.0  5.0 - 8.0   Glucose, UA 250 (*) NEGATIVE mg/dL   Hgb urine dipstick NEGATIVE  NEGATIVE   Bilirubin Urine NEGATIVE  NEGATIVE   Ketones, ur NEGATIVE  NEGATIVE mg/dL   Protein, ur NEGATIVE  NEGATIVE mg/dL   Urobilinogen, UA 1.0  0.0 - 1.0 mg/dL   Nitrite NEGATIVE  NEGATIVE   Leukocytes, UA NEGATIVE  NEGATIVE  POCT I-STAT, CHEM 8      Result Value  Range   Sodium 140  135 - 145 mEq/L   Potassium 3.7  3.5 - 5.1 mEq/L   Chloride 105  96 - 112 mEq/L   BUN 10  6 - 23 mg/dL   Creatinine, Ser 7.82  0.50 - 1.10 mg/dL   Glucose, Bld 956 (*) 70 - 99 mg/dL   Calcium, Ion 2.13  0.86 - 1.23 mmol/L   TCO2 24  0 - 100 mmol/L   Hemoglobin 13.3  12.0 - 15.0 g/dL   HCT 57.8  46.9 - 62.9 %     1. Hyperglycemia       MDM  Patient seen and evaluated. Patient well-appearing in no acute distress.   Labs unremarkable. At this time we'll discharge patient with diet and exercise recommendations. Patient followup closely with PCP.     Angus Seller, PA-C 11/08/12 [phone]

## 2012-11-08 NOTE — ED Notes (Signed)
CBG 228 

## 2012-12-02 ENCOUNTER — Inpatient Hospital Stay (HOSPITAL_COMMUNITY)
Admission: AD | Admit: 2012-12-02 | Discharge: 2012-12-02 | Disposition: A | Discharge status: Home / Self Care | Payer: BC Managed Care – PPO | Source: Ambulatory Visit | Attending: Obstetrics and Gynecology | Admitting: Obstetrics and Gynecology

## 2012-12-02 DIAGNOSIS — N915 Oligomenorrhea, unspecified: Secondary | ICD-10-CM | POA: Insufficient documentation

## 2012-12-02 DIAGNOSIS — Z3201 Encounter for pregnancy test, result positive: Secondary | ICD-10-CM | POA: Insufficient documentation

## 2012-12-02 LAB — POCT PREGNANCY, URINE: Preg Test, Ur: POSITIVE — AB

## 2012-12-02 NOTE — MAU Note (Signed)
I had SAB in March and LMP first of April. Just want to know if I'm pregnant and would like my sugar checked. Denies any pain or problems

## 2012-12-02 NOTE — MAU Note (Signed)
Dr Billy Coast here to see pt. Pt not in lobby.

## 2012-12-02 NOTE — MAU Provider Note (Signed)
  History   Presents for pregnancy test. No complaints noted. No bleeding. History of SAb.  CSN: [id number]  Arrival date and time: 12/02/12 2119   None     Chief Complaint  Patient presents with  . Possible Pregnancy   HPI  OB History   Grav Para Term Preterm Abortions TAB SAB Ect Mult Living   1               Past Medical History  Diagnosis Date  . Diabetes mellitus   . Gonorrhea   . PCOS (polycystic ovarian syndrome)   . Cushing syndrome     Past Surgical History  Procedure Laterality Date  . Abscess drainage    . Tooth extraction      No family history on file.  History  Substance Use Topics  . Smoking status: Current Some Day Smoker    Types: Cigarettes  . Smokeless tobacco: Never Used  . Alcohol Use: No    Allergies: No Known Allergies  No prescriptions prior to admission    ROS Physical Exam   Blood pressure 131/73, pulse 84, temperature 98.7 F (37.1 C), resp. rate 18, height 5\' 7"  (1.702 m), weight 137.984 kg (304 lb 3.2 oz), last menstrual period 07/09/2012, SpO2 100.00%, unknown if currently breastfeeding.  Physical Exam HEENT nl Neck supple Lgs: CTA CV : RRR Abd: nt, no rebound or guarding Pelvic: deferred Ext : Neg c/c/e  MAU Course  Procedures  MDM na  Assessment and Plan  Oligomenorrhea Pos Urine pregnancy test DC home  Fu in office.  Magenta Schmiesing J 12/02/2012, 10:52 PM

## 2012-12-02 NOTE — Progress Notes (Signed)
Dr Billy Coast notified of pt's admission of wanting upt and not having any problems. Aware positive upt and pt will have to be seen before leaving. Is busy now but will call back.

## 2012-12-16 ENCOUNTER — Inpatient Hospital Stay (HOSPITAL_COMMUNITY): Payer: BC Managed Care – PPO

## 2012-12-16 ENCOUNTER — Inpatient Hospital Stay (HOSPITAL_COMMUNITY)
Admission: AD | Admit: 2012-12-16 | Discharge: 2012-12-16 | Disposition: A | Discharge status: Home / Self Care | Payer: BC Managed Care – PPO | Source: Ambulatory Visit | Attending: Obstetrics and Gynecology | Admitting: Obstetrics and Gynecology

## 2012-12-16 ENCOUNTER — Encounter (HOSPITAL_COMMUNITY): Payer: Self-pay

## 2012-12-16 DIAGNOSIS — N949 Unspecified condition associated with female genital organs and menstrual cycle: Secondary | ICD-10-CM | POA: Insufficient documentation

## 2012-12-16 DIAGNOSIS — O99891 Other specified diseases and conditions complicating pregnancy: Secondary | ICD-10-CM | POA: Insufficient documentation

## 2012-12-16 DIAGNOSIS — O9989 Other specified diseases and conditions complicating pregnancy, childbirth and the puerperium: Secondary | ICD-10-CM

## 2012-12-16 DIAGNOSIS — O26891 Other specified pregnancy related conditions, first trimester: Secondary | ICD-10-CM

## 2012-12-16 DIAGNOSIS — R109 Unspecified abdominal pain: Secondary | ICD-10-CM | POA: Insufficient documentation

## 2012-12-16 LAB — CBC
MCH: 32 pg (ref 26.0–34.0)
MCHC: 29.8 g/dL — ABNORMAL LOW (ref 30.0–36.0)
RDW: 16.6 % — ABNORMAL HIGH (ref 11.5–15.5)

## 2012-12-16 LAB — URINALYSIS, ROUTINE W REFLEX MICROSCOPIC
Bilirubin Urine: NEGATIVE
Ketones, ur: 15 mg/dL — AB
Leukocytes, UA: NEGATIVE
Nitrite: NEGATIVE
Protein, ur: NEGATIVE mg/dL
Urobilinogen, UA: 4 mg/dL — ABNORMAL HIGH (ref 0.0–1.0)

## 2012-12-16 LAB — HCG, QUANTITATIVE, PREGNANCY: hCG, Beta Chain, Quant, S: 43468 m[IU]/mL — ABNORMAL HIGH (ref <5)

## 2012-12-16 MED ORDER — PRENATAL VITAMINS 28-0.8 MG PO TABS: 1.0000 | ORAL_TABLET | Freq: Every day | ORAL | Status: DC

## 2012-12-16 NOTE — MAU Note (Signed)
Pt states sharp pain began while at work around 930pm. Denies vaginal bleeding.

## 2012-12-16 NOTE — MAU Provider Note (Signed)
History     CSN: [id number]  Arrival date and time: 12/16/12 0108   None     Chief Complaint  Patient presents with  . Abdominal Pain   HPI  [name] [name] is a 22 y.o. G2P0010 at [redacted]w[redacted]d who presents tonight with pain. She states that the pain became sharp and unbearable while at work. She had a +UPT here, but has not had a confirmed IUP via ultrasound. She states that the pain started about 2130. She states that since coming here the pain has gone away.   Past Medical History  Diagnosis Date  . Diabetes mellitus   . Gonorrhea   . PCOS (polycystic ovarian syndrome)   . Cushing syndrome     Past Surgical History  Procedure Laterality Date  . Abscess drainage    . Tooth extraction      History reviewed. No pertinent family history.  History  Substance Use Topics  . Smoking status: Current Some Day Smoker    Types: Cigarettes  . Smokeless tobacco: Never Used  . Alcohol Use: No    Allergies: No Known Allergies  Prescriptions prior to admission  Medication Sig Dispense Refill  . insulin aspart (NOVOLOG) 100 UNIT/ML injection Inject 3 Units into the skin 3 (three) times daily with meals.      . insulin glargine (LANTUS) 100 UNIT/ML injection Inject 15 Units into the skin at bedtime.      . metFORMIN (GLUCOPHAGE) 500 MG tablet Take 500 mg by mouth 2 (two) times daily with a meal.        ROS Physical Exam   Blood pressure 125/58, pulse 66, temperature 98 F (36.7 C), temperature source Oral, resp. rate 18, height 5\' 7"  (1.702 m), weight 138.438 kg (305 lb 3.2 oz), last menstrual period 10/04/2012, SpO2 99.00%.  Physical Exam  MAU Course  Procedures  Results for orders placed during the hospital encounter of 12/16/12 (from the past 24 hour(s))  URINALYSIS, ROUTINE W REFLEX MICROSCOPIC     Status: Abnormal   Collection Time    12/16/12  1:40 AM      Result Value Range   Color, Urine YELLOW  YELLOW   APPearance CLEAR  CLEAR   Specific Gravity, Urine 1.025   1.005 - 1.030   pH 6.0  5.0 - 8.0   Glucose, UA NEGATIVE  NEGATIVE mg/dL   Hgb urine dipstick NEGATIVE  NEGATIVE   Bilirubin Urine NEGATIVE  NEGATIVE   Ketones, ur 15 (*) NEGATIVE mg/dL   Protein, ur NEGATIVE  NEGATIVE mg/dL   Urobilinogen, UA 4.0 (*) 0.0 - 1.0 mg/dL   Nitrite NEGATIVE  NEGATIVE   Leukocytes, UA NEGATIVE  NEGATIVE  CBC     Status: Abnormal   Collection Time    12/16/12  1:42 AM      Result Value Range   WBC 7.9  4.0 - 10.5 K/uL   RBC 3.25 (*) 3.87 - 5.11 MIL/uL   Hemoglobin 10.4 (*) 12.0 - 15.0 g/dL   HCT 40.9 (*) 81.1 - 91.4 %   MCV 107.4 (*) 78.0 - 100.0 fL   MCH 32.0  26.0 - 34.0 pg   MCHC 29.8 (*) 30.0 - 36.0 g/dL   RDW 78.2 (*) 95.6 - 21.3 %   Platelets 136 (*) 150 - 400 K/uL  HCG, QUANTITATIVE, PREGNANCY     Status: Abnormal   Collection Time    12/16/12  1:42 AM      Result Value Range   hCG,  Beta Chain, Quant, [place] [address] (*) <5 mIU/mL     Assessment and Plan   1. Pelvic pain in pregnancy, antepartum, first trimester    First trimester danger signs reviewed Will start [facility] in [facility] as soon as possible  Tawnya Crook 12/16/2012, 2:07 AM

## 2012-12-20 NOTE — MAU Provider Note (Signed)
Attestation of Attending Supervision of Advanced Practitioner: Evaluation and management procedures were performed by the PA/NP/CNM/OB Fellow under my supervision/collaboration. Chart reviewed and agree with management and plan.  Lakera Viall V 12/20/2012 6:55 AM

## 2012-12-25 ENCOUNTER — Encounter: Payer: BC Managed Care – PPO | Attending: Endocrinology | Admitting: *Deleted

## 2012-12-25 ENCOUNTER — Encounter: Payer: Self-pay | Admitting: *Deleted

## 2012-12-25 VITALS — Ht 66.5 in | Wt 297.6 lb

## 2012-12-25 DIAGNOSIS — E119 Type 2 diabetes mellitus without complications: Secondary | ICD-10-CM | POA: Insufficient documentation

## 2012-12-25 DIAGNOSIS — Z713 Dietary counseling and surveillance: Secondary | ICD-10-CM | POA: Insufficient documentation

## 2012-12-25 NOTE — Patient Instructions (Addendum)
Goals:  Follow Diabetes Meal Plan as instructed  Eat 3 meals and 2-3 snacks, every 3 hrs  Limit carbohydrate intake to 30-45 grams carbohydrate/meal (depending on meal)  Limit carbohydrate intake to 30 grams carbohydrate/snack  Add lean protein foods to meals/snacks  Monitor glucose levels as instructed by your doctor  Aim for 30 mins of physical activity 5 days  Bring food record and glucose log to your next nutrition visit  Drink water throughout the day

## 2012-12-25 NOTE — Progress Notes (Signed)
  Medical Nutrition Therapy:  Appt start time: 1430 end time:  1530.   Assessment:  Primary concerns today: [name] [name] is here for diabetes education.  Diagnosed with D2M and PCOS in October of 2013.  She reports basic diabetes education, but nothing specific. She is also pregnant.  [facility] 07/26/13 She monitors her glucose 6 times a day.  She reports that her glucose ranges from 90-150 mg/dl/.  She has cut out carbohydrates completely.  She skips meals, has limited activity, and has limited nutrient variety  MEDICATIONS: see list   DIETARY INTAKE:  Usual eating pattern includes 2-3 meals and 0 snacks per day.  Everyday foods include lean proteins, vegetables.  Avoided foods include carbohydrates.    24-hr recall:  B ( AM): [place] bacon and eggs with water  Snk ( AM): none usually  L ( PM): usually skips.  Might have chicken and vegetable Snk ( PM): none usually D ( PM): chicken and vegetables Snk ( PM): none usually Beverages: water  Usual physical activity: walk 2 days a week for 15-20 minutes  Estimated energy needs: 1800 calories 200 g carbohydrates 135 g protein 50 g fat  Progress Towards Goal(s):  In progress.   Nutritional Diagnosis:  NB-1.1 Food and nutrition-related knowledge deficit As related to proper balance of fats, proteins, and carbohydrates pertaining to diabetes meal plan.  As evidenced by dietary recall.    Intervention:    The following learning objectives were met by the patient during this course:   States the definition of Gestational Diabetes  States why dietary management is important in controlling blood glucose  Describes the effects each nutrient has on blood glucose levels  Demonstrates ability to create a balanced meal plan  Demonstrates carbohydrate counting   States when to check blood glucose levels  Demonstrates proper blood glucose monitoring techniques  States the effect of stress and exercise on blood glucose levels  States the  importance of limiting caffeine and abstaining from alcohol and smoking   Patient instructed to monitor glucose levels: FBS: 60 - <90 1 hour: <140 2 hour: <120  *Patient received handouts:  Nutrition Diabetes and Pregnancy  Living Well with diabetes .  Monitoring/Evaluation:  Dietary intake, exercise, BGM, and body weight in 1 month(s).

## 2013-01-02 ENCOUNTER — Ambulatory Visit (INDEPENDENT_AMBULATORY_CARE_PROVIDER_SITE_OTHER): Payer: BC Managed Care – PPO | Admitting: General Practice

## 2013-01-02 ENCOUNTER — Encounter: Payer: Self-pay | Admitting: *Deleted

## 2013-01-02 VITALS — BP 114/73 | Temp 98.9°F | Wt 292.9 lb

## 2013-01-02 DIAGNOSIS — Z3480 Encounter for supervision of other normal pregnancy, unspecified trimester: Secondary | ICD-10-CM

## 2013-01-02 DIAGNOSIS — O0990 Supervision of high risk pregnancy, unspecified, unspecified trimester: Secondary | ICD-10-CM | POA: Insufficient documentation

## 2013-01-02 DIAGNOSIS — Z3491 Encounter for supervision of normal pregnancy, unspecified, first trimester: Secondary | ICD-10-CM

## 2013-01-02 LAB — POCT URINALYSIS DIP (DEVICE)
Hgb urine dipstick: NEGATIVE
Ketones, ur: NEGATIVE mg/dL
Protein, ur: 30 mg/dL — AB
Specific Gravity, Urine: 1.025 (ref 1.005–1.030)
pH: 6 (ref 5.0–8.0)

## 2013-01-02 NOTE — Progress Notes (Signed)
Pulse- 80 

## 2013-01-04 LAB — HEMOGLOBINOPATHY EVALUATION
Hemoglobin Other: 0 %
Hgb A: 97.2 % (ref 96.8–97.8)
Hgb F Quant: 0 % (ref 0.0–2.0)
Hgb S Quant: 0 %

## 2013-01-04 LAB — OBSTETRIC PANEL
Antibody Screen: NEGATIVE
Basophils Absolute: 0 10*3/uL (ref 0.0–0.1)
Basophils Relative: 0 % (ref 0–1)
Eosinophils Relative: 1 % (ref 0–5)
HCT: 30.8 % — ABNORMAL LOW (ref 36.0–46.0)
Lymphocytes Relative: 31 % (ref 12–46)
MCHC: 34.7 g/dL (ref 30.0–36.0)
MCV: 89.3 fL (ref 78.0–100.0)
Monocytes Absolute: 0.5 10*3/uL (ref 0.1–1.0)
Neutro Abs: 3.3 10*3/uL (ref 1.7–7.7)
Platelets: 246 10*3/uL (ref 150–400)
RDW: 13.5 % (ref 11.5–15.5)
Rubella: 1.78 Index — ABNORMAL HIGH (ref <0.90)
WBC: 5.6 10*3/uL (ref 4.0–10.5)

## 2013-01-08 ENCOUNTER — Ambulatory Visit (INDEPENDENT_AMBULATORY_CARE_PROVIDER_SITE_OTHER): Payer: BC Managed Care – PPO | Admitting: Family Medicine

## 2013-01-08 ENCOUNTER — Encounter: Payer: Self-pay | Admitting: Family Medicine

## 2013-01-08 VITALS — BP 118/64 | Wt 295.0 lb

## 2013-01-08 DIAGNOSIS — E249 Cushing's syndrome, unspecified: Secondary | ICD-10-CM | POA: Insufficient documentation

## 2013-01-08 DIAGNOSIS — E282 Polycystic ovarian syndrome: Secondary | ICD-10-CM | POA: Insufficient documentation

## 2013-01-08 DIAGNOSIS — O24911 Unspecified diabetes mellitus in pregnancy, first trimester: Secondary | ICD-10-CM

## 2013-01-08 DIAGNOSIS — O0990 Supervision of high risk pregnancy, unspecified, unspecified trimester: Secondary | ICD-10-CM

## 2013-01-08 DIAGNOSIS — E240 Pituitary-dependent Cushing's disease: Secondary | ICD-10-CM

## 2013-01-08 DIAGNOSIS — R8271 Bacteriuria: Secondary | ICD-10-CM

## 2013-01-08 DIAGNOSIS — O24919 Unspecified diabetes mellitus in pregnancy, unspecified trimester: Secondary | ICD-10-CM

## 2013-01-08 LAB — POCT URINALYSIS DIP (DEVICE)
Hgb urine dipstick: NEGATIVE
Ketones, ur: NEGATIVE mg/dL
Protein, ur: NEGATIVE mg/dL
Specific Gravity, Urine: 1.025 (ref 1.005–1.030)
Urobilinogen, UA: >=8 mg/dL (ref 0.0–1.0)
pH: 6.5 (ref 5.0–8.0)

## 2013-01-08 MED ORDER — CEPHALEXIN 500 MG PO CAPS: 500.0000 mg | ORAL_CAPSULE | Freq: Four times a day (QID) | ORAL | Status: DC

## 2013-01-08 NOTE — Progress Notes (Signed)
Subjective:     Objective:    BP 118/64  Wt 295 lb (133.811 kg)  BMI 46.91 kg/m2  LMP 10/04/2012  Physical Exam  Exam  FHT:  150  Uterine Size:    Presentation:          Subjective:    [name] [name] is a G2P0010 [redacted]w[redacted]d being seen today for her first obstetrical visit.  Her obstetrical history is significant for obesity and pre-existing DM, Cushing's disease, PCOS.  Pregnancy history fully reviewed.  Patient reports no complaints.  Filed Vitals:   01/08/13 0822  BP: 118/64  Weight: 295 lb (133.811 kg)    HISTORY: OB History   Grav Para Term Preterm Abortions TAB SAB Ect Mult Living   2 0 0 0 1 0 1 0 0 0      # Outc Date GA Lbr Len/2nd Wgt Sex Del Anes PTL Lv   1 SAB 3/14 [redacted]w[redacted]d          Comments: System Generated. Please review and update pregnancy details.   2 CUR              Past Medical History  Diagnosis Date  . Diabetes mellitus   . Gonorrhea   . PCOS (polycystic ovarian syndrome)   . Cushing syndrome    Past Surgical History  Procedure Laterality Date  . Abscess drainage    . Tooth extraction     Family History  Problem Relation Age of Onset  . COPD Maternal Grandmother   . Hypertension Maternal Grandmother   . Heart disease Maternal Grandmother   . Epilepsy Father      Exam    Uterus: 12 wk size    Pelvic Exam:    Perineum: Normal Perineum   Vulva: Bartholin's, Urethra, Skene's normal   Vagina:  normal mucosa   Cervix: no bleeding following Pap and no cervical motion tenderness   Adnexa: normal adnexa   Bony Pelvis: average  System: Breast:  normal appearance, no masses or tenderness   Skin: normal coloration and turgor, no rashes    Neurologic: oriented   Extremities: normal strength, tone, and muscle mass   HEENT sclera clear, anicteric   Mouth/Teeth mucous membranes moist, pharynx normal without lesions   Neck supple   Cardiovascular: regular rate and rhythm, no murmurs or gallops   Respiratory:  appears well, vitals  normal, no respiratory distress, acyanotic, normal RR, ear and throat exam is normal, neck free of mass or lymphadenopathy, chest clear, no wheezing, crepitations, rhonchi, normal symmetric air entry   Abdomen: soft, non-tender; bowel sounds normal; no masses,  no organomegaly      Assessment:    Pregnancy: G2P0010 Patient Active Problem List   Diagnosis Date Noted  . Supervision of high-risk pregnancy 01/02/2013    Priority: High  . Diabetes mellitus, antepartum 01/08/2013    Priority: Medium  . Cushing's disease 01/08/2013  . PCOS (polycystic ovarian syndrome) 01/08/2013  . Asymptomatic bacteriuria in pregnancy in first trimester 01/08/2013        Plan:     Initial labs drawn. Prenatal vitamins. Problem list reviewed and updated. Genetic Screening discussed First Screen: declined.  Ultrasound discussed; fetal survey: discussed + need for ECHO.  Follow up in 2 weeks. Saw Dr. Talmage Nap this pregnancy, who started her on insulin, was on metformin.  Had HgbA1C and likely TSH there.  Needs baseline labs and 24 hour urine, + Optho referral.  Discussed tight BS control.   Ehsan Corvin S 01/08/2013

## 2013-01-08 NOTE — Progress Notes (Signed)
Pulse: 77

## 2013-01-08 NOTE — Patient Instructions (Addendum)
Following an appropriate diet and keeping your blood sugar under control is the most important thing to do for your health and that of your unborn baby.  Please check your blood sugar 4 times daily.  Please keep accurate BS logs and bring them with you to every visit.  Please bring your meter also.  Goals for Blood sugar should be: 1. Fasting (first thing in the morning before eating) should be less than 90.   2.  2 hours after meals should be less than 120. 3.  At 1 hour after meals should be less than 140.  Please eat 3 meals and 3 snacks.  Include protein (meat, dairy-cheese, eggs, nuts) with all meals.  Be mindful that carbohydrates increase your blood sugar.  Not just sweet food (cookies, cake, donuts, fruit, juice, soda) but also bread, pasta, rice, and potatoes.  You have to limit how many carbs you are eating.  Adding exercise, as little as 30 minutes a day can decrease your blood sugar.  Pregnancy - First Trimester During sexual intercourse, millions of sperm go into the vagina. Only 1 sperm will penetrate and fertilize the female egg while it is in the Fallopian tube. One week later, the fertilized egg implants into the wall of the uterus. An embryo begins to develop into a baby. At 6 to 8 weeks, the eyes and face are formed and the heartbeat can be seen on ultrasound. At the end of 12 weeks (first trimester), all the baby's organs are formed. Now that you are pregnant, you will want to do everything you can to have a healthy baby. Two of the most important things are to get good prenatal care and follow your caregiver's instructions. Prenatal care is all the medical care you receive before the baby's birth. It is given to prevent, find, and treat problems during the pregnancy and childbirth. PRENATAL EXAMS  During prenatal visits, your weight, blood pressure, and urine are checked. This is done to make sure you are healthy and progressing normally during the pregnancy.  A pregnant  woman should gain 25 to 35 pounds during the pregnancy. However, if you are overweight or underweight, your caregiver will advise you regarding your weight.  Your caregiver will ask and answer questions for you.  Blood work, cervical cultures, other necessary tests, and a Pap test are done during your prenatal exams. These tests are done to check on your health and the probable health of your baby. Tests are strongly recommended and done for HIV with your permission. This is the virus that causes AIDS. These tests are done because medicines can be given to help prevent your baby from being born with this infection should you have been infected without knowing it. Blood work is also used to find out your blood type, previous infections, and follow your blood levels (hemoglobin).  Low hemoglobin (anemia) is common during pregnancy. Iron and vitamins are given to help prevent this. Later in the pregnancy, blood tests for diabetes will be done along with any other tests if any problems develop.  You may need other tests to make sure you and the baby are doing well. CHANGES DURING THE FIRST TRIMESTER  Your body goes through many changes during pregnancy. They vary from person to person. Talk to your caregiver about changes you notice and are concerned about. Changes can include:  Your menstrual period stops.  The egg and sperm carry the genes that determine what you look like. Genes from you and your  partner are forming a baby. The female genes determine whether the baby is a boy or a girl.  Your body increases in girth and you may feel bloated.  Feeling sick to your stomach (nauseous) and throwing up (vomiting). If the vomiting is uncontrollable, call your caregiver.  Your breasts will begin to enlarge and become tender.  Your nipples may stick out more and become darker.  The need to urinate more. Painful urination may mean you have a bladder infection.  Tiring easily.  Loss of  appetite.  Cravings for certain kinds of food.  At first, you may gain or lose a couple of pounds.  You may have changes in your emotions from day to day (excited to be pregnant or concerned something may go wrong with the pregnancy and baby).  You may have more vivid and strange dreams. HOME CARE INSTRUCTIONS   It is very important to avoid all smoking, alcohol and non-prescribed drugs during your pregnancy. These affect the formation and growth of the baby. Avoid chemicals while pregnant to ensure the delivery of a healthy infant.  Start your prenatal visits by the 12th week of pregnancy. They are usually scheduled monthly at first, then more often in the last 2 months before delivery. Keep your caregiver's appointments. Follow your caregiver's instructions regarding medicine use, blood and lab tests, exercise, and diet.  During pregnancy, you are providing food for you and your baby. Eat regular, well-balanced meals. Choose foods such as meat, fish, milk and other low fat dairy products, vegetables, fruits, and whole-grain breads and cereals. Your caregiver will tell you of the ideal weight gain.  You can help morning sickness by keeping soda crackers at the bedside. Eat a couple before arising in the morning. You may want to use the crackers without salt on them.  Eating 4 to 5 small meals rather than 3 large meals a day also may help the nausea and vomiting.  Drinking liquids between meals instead of during meals also seems to help nausea and vomiting.  A physical sexual relationship may be continued throughout pregnancy if there are no other problems. Problems may be early (premature) leaking of amniotic fluid from the membranes, vaginal bleeding, or belly (abdominal) pain.  Exercise regularly if there are no restrictions. Check with your caregiver or physical therapist if you are unsure of the safety of some of your exercises. Greater weight gain will occur in the last 2 trimesters of  pregnancy. Exercising will help:  Control your weight.  Keep you in shape.  Prepare you for labor and delivery.  Help you lose your pregnancy weight after you deliver your baby.  Wear a good support or jogging bra for breast tenderness during pregnancy. This may help if worn during sleep too.  Ask when prenatal classes are available. Begin classes when they are offered.  Do not use hot tubs, steam rooms, or saunas.  Wear your seat belt when driving. This protects you and your baby if you are in an accident.  Avoid raw meat, uncooked cheese, cat litter boxes, and soil used by cats throughout the pregnancy. These carry germs that can cause birth defects in the baby.  The first trimester is a good time to visit your dentist for your dental health. Getting your teeth cleaned is okay. Use a softer toothbrush and brush gently during pregnancy.  Ask for help if you have financial, counseling, or nutritional needs during pregnancy. Your caregiver will be able to offer counseling for these needs  as well as refer you for other special needs.  Do not take any medicines or herbs unless told by your caregiver.  Inform your caregiver if there is any mental or physical domestic violence.  Make a list of emergency phone numbers of family, friends, hospital, and police and fire departments.  Write down your questions. Take them to your prenatal visit.  Do not douche.  Do not cross your legs.  If you have to stand for long periods of time, rotate you feet or take small steps in a circle.  You may have more vaginal secretions that may require a sanitary pad. Do not use tampons or scented sanitary pads. MEDICINES AND DRUG USE IN PREGNANCY  Take prenatal vitamins as directed. The vitamin should contain 1 milligram of folic acid. Keep all vitamins out of reach of children. Only a couple vitamins or tablets containing iron may be fatal to a baby or young child when ingested.  Avoid use of all  medicines, including herbs, over-the-counter medicines, not prescribed or suggested by your caregiver. Only take over-the-counter or prescription medicines for pain, discomfort, or fever as directed by your caregiver. Do not use aspirin, ibuprofen, or naproxen unless directed by your caregiver.  Let your caregiver also know about herbs you may be using.  Alcohol is related to a number of birth defects. This includes fetal alcohol syndrome. All alcohol, in any form, should be avoided completely. Smoking will cause low birth rate and premature babies.  Street or illegal drugs are very harmful to the baby. They are absolutely forbidden. A baby born to an addicted mother will be addicted at birth. The baby will go through the same withdrawal an adult does.  Let your caregiver know about any medicines that you have to take and for what reason you take them. SEEK MEDICAL CARE IF:  You have any concerns or worries during your pregnancy. It is better to call with your questions if you feel they cannot wait, rather than worry about them. SEEK IMMEDIATE MEDICAL CARE IF:   An unexplained oral temperature above 102 F (38.9 C) develops, or as your caregiver suggests.  You have leaking of fluid from the vagina (birth canal). If leaking membranes are suspected, take your temperature and inform your caregiver of this when you call.  There is vaginal spotting or bleeding. Notify your caregiver of the amount and how many pads are used.  You develop a bad smelling vaginal discharge with a change in the color.  You continue to feel sick to your stomach (nauseated) and have no relief from remedies suggested. You vomit blood or coffee ground-like materials.  You lose more than 2 pounds of weight in 1 week.  You gain more than 2 pounds of weight in 1 week and you notice swelling of your face, hands, feet, or legs.  You gain 5 pounds or more in 1 week (even if you do not have swelling of your hands, face, legs,  or feet).  You get exposed to [place] measles and have never had them.  You are exposed to fifth disease or chickenpox.  You develop belly (abdominal) pain. Round ligament discomfort is a common non-cancerous (benign) cause of abdominal pain in pregnancy. Your caregiver still must evaluate this.  You develop headache, fever, diarrhea, pain with urination, or shortness of breath.  You fall or are in a car accident or have any kind of trauma.  There is mental or physical violence in your home. Document Released: 06/15/2001  Document Revised: 03/15/2012 Document Reviewed: 12/17/2008 [facility] Patient Information 2014 [place], [place].  Gestational Diabetes Mellitus Gestational diabetes mellitus, often simply referred to as gestational diabetes, is a type of diabetes that some women develop during pregnancy. In gestational diabetes, the pancreas does not make enough insulin (a hormone), the cells are less responsive to the insulin that is made (insulin resistance), or both.Normally, insulin moves sugars from food into the tissue cells. The tissue cells use the sugars for energy. The lack of insulin or the lack of normal response to insulin causes excess sugars to build up in the blood instead of going into the tissue cells. As a result, high blood sugar (hyperglycemia) develops. The effect of high sugar (glucose) levels can cause many complications.  RISK FACTORS You have an increased chance of developing gestational diabetes if you have a family history of diabetes and also have one or more of the following risk factors:  A body mass index over 30 (obesity).  A previous pregnancy with gestational diabetes.  An older age at the time of pregnancy. If blood glucose levels are kept in the normal range during pregnancy, women can have a healthy pregnancy. If your blood glucose levels are not well controlled, there may be risks to you, your unborn baby (fetus), your labor and delivery, or your newborn  baby.  SYMPTOMS  If symptoms are experienced, they are much like symptoms you would normally expect during pregnancy. The symptoms of gestational diabetes include:   Increased thirst (polydipsia).  Increased urination (polyuria).  Increased urination during the night (nocturia).  Weight loss. This weight loss may be rapid.  Frequent, recurring infections.  Tiredness (fatigue).  Weakness.  Vision changes, such as blurred vision.  Fruity smell to your breath.  Abdominal pain. DIAGNOSIS Diabetes is diagnosed when blood glucose levels are increased. Your blood glucose level may be checked by one or more of the following blood tests:  A fasting blood glucose test. You will not be allowed to eat for at least 8 hours before a blood sample is taken.  A random blood glucose test. Your blood glucose is checked at any time of the day regardless of when you ate.  A hemoglobin A1c blood glucose test. A hemoglobin A1c test provides information about blood glucose control over the previous 3 months.  An oral glucose tolerance test (OGTT). Your blood glucose is measured after you have not eaten (fasted) for 1 3 hours and then after you drink a glucose-containing beverage. Since the hormones that cause insulin resistance are highest at about 24 28 weeks of a pregnancy, an OGTT is usually performed during that time. If you have risk factors for gestational diabetes, your caregiver may test you for gestational diabetes earlier than 24 weeks of pregnancy. TREATMENT   You will need to take diabetes medicine or insulin daily to keep blood glucose levels in the desired range.  You will need to match insulin dosing with exercise and healthy food choices. The treatment goal is to maintain the before meal (preprandial), bedtime, and overnight blood glucose level at 60 99 mg/dL during pregnancy. The treatment goal is to further maintain peak after meal blood sugar (postprandial glucose) level at 100 140  mg/dL.  HOME CARE INSTRUCTIONS   Have your hemoglobin A1c level checked twice a year.  Perform daily blood glucose monitoring as directed by your caregiver. It is common to perform frequent blood glucose monitoring.  Monitor urine ketones when you are ill and as directed by your  caregiver.  Take your diabetes medicine and insulin as directed by your caregiver to maintain your blood glucose level in the desired range.  Never run out of diabetes medicine or insulin. It is needed every day.  Adjust insulin based on your intake of carbohydrates. Carbohydrates can raise blood glucose levels but need to be included in your diet. Carbohydrates provide vitamins, minerals, and fiber which are an essential part of a healthy diet. Carbohydrates are found in fruits, vegetables, whole grains, dairy products, legumes, and foods containing added sugars.    Eat healthy foods. Alternate 3 meals with 3 snacks.  Maintain a healthy weight gain. The usual total expected weight gain varies according to your prepregnancy body mass index (BMI).  Carry a medical alert card or wear your medical alert jewelry.  Carry a 15 gram carbohydrate snack with you at all times to treat low blood glucose (hypoglycemia). Some examples of 15 gram carbohydrate snacks include:  Glucose tablets, 3 or 4   Glucose gel, 15 gram tube  Raisins, 2 tablespoons (24 g)  Jelly beans, 6  Animal crackers, 8  Fruit juice, regular soda, or low fat milk, 4 ounces (120 mL)  Gummy treats, 9    Recognize hypoglycemia. Hypoglycemia during pregnancy occurs with blood glucose levels of 60 mg/dL and below. The risk for hypoglycemia increases when fasting or skipping meals, during or after intense exercise, and during sleep. Hypoglycemia symptoms can include:  Tremors or shakes.  Decreased ability to concentrate.  Sweating.  Increased heart rate.  Headache.  Dry mouth.  Hunger.  Irritability.  Anxiety.  Restless  sleep.  Altered speech or coordination.  Confusion.  Treat hypoglycemia promptly. If you are alert and able to safely swallow, follow the 15:15 rule:  Take 15 20 grams of rapid-acting glucose or carbohydrate. Rapid-acting options include glucose gel, glucose tablets, or 4 ounces (120 mL) of fruit juice, regular soda, or low fat milk.  Check your blood glucose level 15 minutes after taking the glucose.   Take 15 20 grams more of glucose if the repeat blood glucose level is still 70 mg/dL or below.  Eat a meal or snack within 1 hour once blood glucose levels return to normal.  Be alert to polyuria and polydipsia which are early signs of hyperglycemia. An early awareness of hyperglycemia allows for prompt treatment. Treat hyperglycemia as directed by your caregiver.  Engage in at least 30 minutes of physical activity a day or as directed by your caregiver. Ten minutes of physical activity timed 30 minutes after each meal is encouraged to control postprandial blood glucose levels.  Adjust your insulin dosing and food intake as needed if you start a new exercise or sport.  Follow your sick day plan at any time you are unable to eat or drink as usual.  Avoid tobacco and alcohol use.  Follow up with your caregiver regularly.  Follow the advice of your caregiver regarding your prenatal and post-delivery (postpartum) appointments, meal planning, exercise, medicines, vitamins, blood tests, other medical tests, and physical activities.  Perform daily skin and foot care. Examine your skin and feet daily for cuts, bruises, redness, nail problems, bleeding, blisters, or sores.  Brush your teeth and gums at least twice a day and floss at least once a day. Follow up with your dentist regularly.  Schedule an eye exam during the first trimester of your pregnancy or as directed by your caregiver.  Share your diabetes management plan with your workplace or school.  Stay  up-to-date with  immunizations.  Learn to manage stress.  Obtain ongoing diabetes education and support as needed. SEEK MEDICAL CARE IF:   You are unable to eat food or drink fluids for more than 6 hours.  You have nausea and vomiting for more than 6 hours.  You have a blood glucose level of 200 mg/dL and you have ketones in your urine.  There is a change in mental status.  You develop vision problems.  You have a persistent headache.  You have upper abdominal pain or discomfort.  You develop an additional serious illness.  You have diarrhea for more than 6 hours.  You have been sick or have had a fever for a couple of days and are not getting better. SEEK IMMEDIATE MEDICAL CARE IF:   You have difficulty breathing.  You no longer feel the baby moving.  You are bleeding or have discharge from your vagina.  You start having premature contractions or labor. MAKE SURE YOU:  Understand these instructions.  Will watch your condition.  Will get help right away if you are not doing well or get worse. Document Released: 09/27/2000 Document Revised: 03/15/2012 Document Reviewed: 01/18/2012 [facility] Patient Information 2014 [place], [place].  Breastfeeding A change in hormones during your pregnancy causes growth of your breast tissue and an increase in number and size of milk ducts. The hormone prolactin allows proteins, sugars, and fats from your blood supply to make breast milk in your milk-producing glands. The hormone progesterone prevents breast milk from being released before the birth of your baby. After the birth of your baby, your progesterone level decreases allowing breast milk to be released. Thoughts of your baby, as well as his or her sucking or crying, can stimulate the release of milk from the milk-producing glands. Deciding to breastfeed (nurse) is one of the best choices you can make for you and your baby. The information that follows gives a brief review of the benefits, as well  as other important skills to know about breastfeeding. BENEFITS OF BREASTFEEDING For your baby  The first milk (colostrum) helps your baby's digestive system function better.   There are antibodies in your milk that help your baby fight off infections.   Your baby has a lower incidence of asthma, allergies, and sudden infant death syndrome (SIDS).   The nutrients in breast milk are better for your baby than infant formulas.  Breast milk improves your baby's brain development.   Your baby will have less gas, colic, and constipation.  Your baby is less likely to develop other conditions, such as childhood obesity, asthma, or diabetes mellitus. For you  Breastfeeding helps develop a very special bond between you and your baby.   Breastfeeding is convenient, always available at the correct temperature, and costs nothing.   Breastfeeding helps to burn calories and helps you lose the weight gained during pregnancy.   Breastfeeding makes your uterus contract back down to normal size faster and slows bleeding following delivery.   Breastfeeding mothers have a lower risk of developing osteoporosis or breast or ovarian cancer later in life.  BREASTFEEDING FREQUENCY  A healthy, full-term baby may breastfeed as often as every hour or space his or her feedings to every 3 hours. Breastfeeding frequency will vary from baby to baby.   Newborns should be fed no less than every 2 3 hours during the day and every 4 5 hours during the night. You should breastfeed a minimum of 8 feedings in a 24 hour  period.  Awaken your baby to breastfeed if it has been 3 4 hours since the last feeding.  Breastfeed when you feel the need to reduce the fullness of your breasts or when your newborn shows signs of hunger. Signs that your baby may be hungry include:  Increased alertness or activity.  Stretching.  Movement of the head from side to side.  Movement of the head and opening of the mouth when  the corner of the mouth or cheek is stroked (rooting).  Increased sucking sounds, smacking lips, cooing, sighing, or squeaking.  Hand-to-mouth movements.  Increased sucking of fingers or hands.  Fussing.  Intermittent crying.  Signs of extreme hunger will require calming and consoling before you try to feed your baby. Signs of extreme hunger may include:  Restlessness.  A loud, strong cry.  Screaming.  Frequent feeding will help you make more milk and will help prevent problems, such as sore nipples and engorgement of the breasts.  BREASTFEEDING   Whether lying down or sitting, be sure that the baby's abdomen is facing your abdomen.   Support your breast with 4 fingers under your breast and your thumb above your nipple. Make sure your fingers are well away from your nipple and your baby's mouth.   Stroke your baby's lips gently with your finger or nipple.   When your baby's mouth is open wide enough, place all of your nipple and as much of the colored area around your nipple (areola) as possible into your baby's mouth.  More areola should be visible above his or her upper lip than below his or her lower lip.  Your baby's tongue should be between his or her lower gum and your breast.  Ensure that your baby's mouth is correctly positioned around the nipple (latched). Your baby's lips should create a seal on your breast.  Signs that your baby has effectively latched onto your nipple include:  Tugging or sucking without pain.  Swallowing heard between sucks.  Absent click or smacking sound.  Muscle movement above and in front of his or her ears with sucking.  Your baby must suck about 2 3 minutes in order to get your milk. Allow your baby to feed on each breast as long as he or she wants. Nurse your baby until he or she unlatches or falls asleep at the first breast, then offer the second breast.  Signs that your baby is full and satisfied include:  A gradual  decrease in the number of sucks or complete cessation of sucking.  Falling asleep.  Extension or relaxation of his or her body.  Retention of a small amount of milk in his or her mouth.  Letting go of your breast by himself or herself.  Signs of effective breastfeeding in you include:  Breasts that have increased firmness, weight, and size prior to feeding.  Breasts that are softer after nursing.  Increased milk volume, as well as a change in milk consistency and color by the 5th day of breastfeeding.  Breast fullness relieved by breastfeeding.  Nipples are not sore, cracked, or bleeding.  If needed, break the suction by putting your finger into the corner of your baby's mouth and sliding your finger between his or her gums. Then, remove your breast from his or her mouth.  It is common for babies to spit up a small amount after a feeding.  Babies often swallow air during feeding. This can make babies fussy. Burping your baby between breasts can help  with this.  Vitamin D supplements are recommended for babies who get only breast milk.  Avoid using a pacifier during your baby's first 4 6 weeks.  Avoid supplemental feedings of water, formula, or juice in place of breastfeeding. Breast milk is all the food your baby needs. It is not necessary for your baby to have water or formula. Your breasts will make more milk if supplemental feedings are avoided during the early weeks. HOW TO TELL WHETHER YOUR BABY IS GETTING ENOUGH BREAST MILK Wondering whether or not your baby is getting enough milk is a common concern among mothers. You can be assured that your baby is getting enough milk if:   Your baby is actively sucking and you hear swallowing.   Your baby seems relaxed and satisfied after a feeding.   Your baby nurses at least 8 12 times in a 24 hour time period.  During the first 56 93 days of age:  Your baby is wetting at least 3 5 diapers in a 24 hour period. The urine should  be clear and pale yellow.  Your baby is having at least 3 4 stools in a 24 hour period. The stool should be soft and yellow.  At 2 63 days of age, your baby is having at least 3 6 stools in a 24 hour period. The stool should be seedy and yellow by 62 days of age.  Your baby has a weight loss less than 7 10% during the first 66 days of age.  Your baby does not lose weight after 70 54 days of age.  Your baby gains 4 7 ounces each week after he or she is 37 days of age.  Your baby gains weight by 35 days of age and is back to birth weight within 2 weeks. ENGORGEMENT In the first week after your baby is born, you may experience extremely full breasts (engorgement). When engorged, your breasts may feel heavy, warm, or tender to the touch. Engorgement peaks within 24 48 hours after delivery of your baby.  Engorgement may be reduced by:  Continuing to breastfeed.  Increasing the frequency of breastfeeding.  Taking warm showers or applying warm, moist heat to your breasts just before each feeding. This increases circulation and helps the milk flow.   Gently massaging your breast before and during the feedings. With your fingertips, massage from your chest wall towards your nipple in a circular motion.   Ensuring that your baby empties at least one breast at every feeding. It also helps to start the next feeding on the opposite breast.   Expressing breast milk by hand or by using a breast pump to empty the breasts if your baby is sleepy, or not nursing well. You may also want to express milk if you are returning to work oryou feel you are getting engorged.  Ensuring your baby is latched on and positioned properly while breastfeeding. If you follow these suggestions, your engorgement should improve in 24 48 hours. If you are still experiencing difficulty, call your lactation consultant or caregiver.  CARING FOR YOURSELF Take care of your breasts.  Bathe or shower daily.   Avoid using soap on  your nipples.   Wear a supportive bra. Avoid wearing underwire style bras.  Air dry your nipples for a 3 after each feeding.   Use only cotton bra pads to absorb breast milk leakage. Leaking of breast milk between feedings is normal.   Use only pure lanolin on your nipples after  nursing. You do not need to wash it off before feeding your baby again. Another option is to express a few drops of breast milk and gently massage that milk into your nipples.  Continue breast self-awareness checks. Take care of yourself.  Eat healthy foods. Alternate 3 meals with 3 snacks.  Avoid foods that you notice affect your baby in a bad way.  Drink milk, fruit juice, and water to satisfy your thirst (about 8 glasses a day).   Rest often, relax, and take your prenatal vitamins to prevent fatigue, stress, and anemia.  Avoid chewing and smoking tobacco.  Avoid alcohol and drug use.  Take over-the-counter and prescribed medicine only as directed by your caregiver or pharmacist. You should always check with your caregiver or pharmacist before taking any new medicine, vitamin, or herbal supplement.  Know that pregnancy is possible while breastfeeding. If desired, talk to your caregiver about family planning and safe birth control methods that may be used while breastfeeding. SEEK MEDICAL CARE IF:   You feel like you want to stop breastfeeding or have become frustrated with breastfeeding.  You have painful breasts or nipples.  Your nipples are cracked or bleeding.  Your breasts are red, tender, or warm.  You have a swollen area on either breast.  You have a fever or chills.  You have nausea or vomiting.  You have drainage from your nipples.  Your breasts do not become full before feedings by the 5th day after delivery.  You feel sad and depressed.  Your baby is too sleepy to eat well.  Your baby is having trouble sleeping.   Your baby is wetting less than 3 diapers in a 24  hour period.  Your baby has less than 3 stools in a 24 hour period.  Your baby's skin or the white part of his or her eyes becomes more yellow.   Your baby is not gaining weight by 13 days of age. MAKE SURE YOU:   Understand these instructions.  Will watch your condition.  Will get help right away if you are not doing well or get worse. Document Released: 06/21/2005 Document Revised: 03/15/2012 Document Reviewed: 01/26/2012 [facility] Patient Information 2014 [place], [place].

## 2013-01-15 ENCOUNTER — Encounter: Payer: Self-pay | Admitting: Endocrinology

## 2013-01-16 ENCOUNTER — Telehealth: Payer: Self-pay | Admitting: *Deleted

## 2013-01-16 NOTE — Telephone Encounter (Signed)
I returned pt's call and discussed her concern. She states that she has been having itching and a slight rash ever since she became pregnant. She is not sure if this may be caused by her medication. She denies having changed laundry detergent, soap, etc. She further states that the itching became a little bit worse after starting the antibiotic (keflex) last week but confirms that she had already been having itching for about 5-6 wks prior. She has approximately 4 pills of keflex left. I advised pt to stop the keflex. She may take Benadryl for the itching and rash (it may make her drowsy). We will evaluate further at her next clinic appt on 7/21. She should call us back if her condition worsens.  Pt agreed and voiced understanding.

## 2013-01-16 NOTE — Telephone Encounter (Signed)
Patient left a message that she is having problems with her medicine.

## 2013-01-17 ENCOUNTER — Emergency Department (HOSPITAL_COMMUNITY)
Admission: EM | Admit: 2013-01-17 | Discharge: 2013-01-17 | Disposition: A | Discharge status: Home / Self Care | Payer: BC Managed Care – PPO | Attending: Emergency Medicine | Admitting: Emergency Medicine

## 2013-01-17 ENCOUNTER — Encounter (HOSPITAL_COMMUNITY): Payer: Self-pay | Admitting: *Deleted

## 2013-01-17 DIAGNOSIS — B86 Scabies: Secondary | ICD-10-CM | POA: Insufficient documentation

## 2013-01-17 DIAGNOSIS — Z8619 Personal history of other infectious and parasitic diseases: Secondary | ICD-10-CM | POA: Insufficient documentation

## 2013-01-17 DIAGNOSIS — Z8639 Personal history of other endocrine, nutritional and metabolic disease: Secondary | ICD-10-CM | POA: Insufficient documentation

## 2013-01-17 DIAGNOSIS — Z79899 Other long term (current) drug therapy: Secondary | ICD-10-CM | POA: Insufficient documentation

## 2013-01-17 DIAGNOSIS — R21 Rash and other nonspecific skin eruption: Secondary | ICD-10-CM | POA: Insufficient documentation

## 2013-01-17 DIAGNOSIS — O24919 Unspecified diabetes mellitus in pregnancy, unspecified trimester: Secondary | ICD-10-CM | POA: Insufficient documentation

## 2013-01-17 DIAGNOSIS — IMO0002 Reserved for concepts with insufficient information to code with codable children: Secondary | ICD-10-CM | POA: Insufficient documentation

## 2013-01-17 DIAGNOSIS — Z862 Personal history of diseases of the blood and blood-forming organs and certain disorders involving the immune mechanism: Secondary | ICD-10-CM | POA: Insufficient documentation

## 2013-01-17 DIAGNOSIS — O9989 Other specified diseases and conditions complicating pregnancy, childbirth and the puerperium: Secondary | ICD-10-CM | POA: Insufficient documentation

## 2013-01-17 DIAGNOSIS — Z794 Long term (current) use of insulin: Secondary | ICD-10-CM | POA: Insufficient documentation

## 2013-01-17 DIAGNOSIS — Z8742 Personal history of other diseases of the female genital tract: Secondary | ICD-10-CM | POA: Insufficient documentation

## 2013-01-17 MED ORDER — PERMETHRIN 5 % EX CREA: TOPICAL_CREAM | CUTANEOUS | Status: DC

## 2013-01-17 NOTE — ED Notes (Signed)
rx x 1 for permethrin given

## 2013-01-17 NOTE — ED Provider Notes (Signed)
History    This chart was scribed for a non-physician practitioner working with Raeford Razor, MD by Jiles Prows, ED scribe. This patient was seen in room WTR5/WTR5 and the patient's care was started at 10:33 PM.  CSN: [id number] Arrival date & time 01/17/13  2200    Chief Complaint  Patient presents with  . Rash   The history is provided by the patient and medical records. No language interpreter was used.   HPI Comments: [name] [name] is a 22 y.o. female with a h/o DM, and PCOS who presents to the Emergency Department complaining of itching and rash to skin onset a 2 weeks ago.  Pt reports that it itches most at night and localizes rash and itching to arms, webs of fingers, torso, and thighs.  She has not taken any medications such as benadryl for the itching because she is pregnant and is unsure of what she can take. Pt denies headache, diaphoresis, fever, chills, nausea, vomiting, diarrhea, weakness, cough, SOB and any other pain.   Past Medical History  Diagnosis Date  . Diabetes mellitus   . Gonorrhea   . PCOS (polycystic ovarian syndrome)   . Cushing syndrome    Past Surgical History  Procedure Laterality Date  . Abscess drainage    . Tooth extraction     Family History  Problem Relation Age of Onset  . COPD Maternal Grandmother   . Hypertension Maternal Grandmother   . Heart disease Maternal Grandmother   . Epilepsy Father    History  Substance Use Topics  . Smoking status: Former Smoker    Quit date: 12/13/2012  . Smokeless tobacco: Never Used  . Alcohol Use: No   OB History   Grav Para Term Preterm Abortions TAB SAB Ect Mult Living   2 0 0 0 1 0 1 0 0 0      Review of Systems  Skin: Positive for rash.  All other systems reviewed and are negative.    Allergies  Review of patient's allergies indicates no known allergies.  Home Medications   Current Outpatient Rx  Name  Route  Sig  Dispense  Refill  . cephALEXin (KEFLEX) 500 MG capsule   Oral    Take 1 capsule (500 mg total) by mouth 4 (four) times daily.   28 capsule   2   . insulin aspart (NOVOLOG) 100 UNIT/ML injection   Subcutaneous   Inject 3 Units into the skin 3 (three) times daily with meals.         . insulin glargine (LANTUS) 100 UNIT/ML injection   Subcutaneous   Inject 15 Units into the skin at bedtime.         . metFORMIN (GLUCOPHAGE) 500 MG tablet   Oral   Take 500 mg by mouth 2 (two) times daily with a meal.         . Prenatal Vit-Fe Fumarate-FA (PRENATAL VITAMINS) 28-0.8 MG TABS   Oral   Take 1 tablet by mouth daily.   30 tablet   11    BP 114/69  Pulse 74  Temp(Src) 98.6 F (37 C) (Oral)  Resp 20  SpO2 98%  LMP 10/04/2012 Physical Exam  Nursing note and vitals reviewed. Constitutional: She is oriented to person, place, and time. She appears well-developed and well-nourished. No distress.  HENT:  Head: Normocephalic and atraumatic.  Right Ear: External ear normal.  Left Ear: External ear normal.  Nose: Nose normal.  Mouth/Throat: Oropharynx is clear and moist.  Eyes: Conjunctivae are normal.  Neck: Normal range of motion.  Cardiovascular: Normal rate, regular rhythm and normal heart sounds.   Pulmonary/Chest: Effort normal and breath sounds normal. No stridor. No respiratory distress. She has no wheezes. She has no rales.  Abdominal: Soft. She exhibits no distension.  Musculoskeletal: Normal range of motion.  Neurological: She is alert and oriented to person, place, and time. She has normal strength.  Skin: Skin is warm and dry. She is not diaphoretic. No erythema.  Small bumps diffusely on arms bilaterally.  Small burrows between webs of fingers.    Psychiatric: She has a normal mood and affect. Her behavior is normal.    ED Course  Procedures (including critical care time) DIAGNOSTIC STUDIES: Oxygen Saturation is 98% on RA, normal by my interpretation.    COORDINATION OF CARE: 10:36 PM - Discussed ED treatment with pt at  bedside including cream for scabies and benadryl and pt agrees. Pt advised to spray couches and bed and wash everything well.  Labs Reviewed - No data to display No results found. 1. Scabies     MDM  Discussed diagnosis & treatment of scabies.  She has been advised to followup with her primary care doctor 2 weeks after treatment.  She has also been advised to clean entire household including washing sheets and using R.I.D. spray in the car and on sofa.   The use of permethrin cream was discussed as well, she was told to use cream from head to toe & leave on for 8-12 hours.  She has been advised to repeat treatment if new eruptions occur. Patient's parents verbalized understanding.    I personally performed the services described in this documentation, which was scribed in my presence. The recorded information has been reviewed and is accurate.     Mora Bellman, PA-C 01/18/13 [phone]

## 2013-01-17 NOTE — ED Notes (Signed)
Rash on arms/thighs; itching; x few wks

## 2013-01-19 NOTE — ED Provider Notes (Signed)
Medical screening examination/treatment/procedure(s) were performed by non-physician practitioner and as supervising physician I was immediately available for consultation/collaboration.  Aldena Worm, MD 01/19/13 1455 

## 2013-01-22 ENCOUNTER — Ambulatory Visit (INDEPENDENT_AMBULATORY_CARE_PROVIDER_SITE_OTHER): Payer: BC Managed Care – PPO | Admitting: Family Medicine

## 2013-01-22 ENCOUNTER — Encounter: Payer: Self-pay | Admitting: Obstetrics and Gynecology

## 2013-01-22 VITALS — BP 129/76 | Temp 98.2°F | Wt 294.0 lb

## 2013-01-22 DIAGNOSIS — O24112 Pre-existing diabetes mellitus, type 2, in pregnancy, second trimester: Secondary | ICD-10-CM

## 2013-01-22 DIAGNOSIS — O24919 Unspecified diabetes mellitus in pregnancy, unspecified trimester: Secondary | ICD-10-CM

## 2013-01-22 LAB — US OB LIMITED

## 2013-01-22 LAB — POCT URINALYSIS DIP (DEVICE)
Glucose, UA: NEGATIVE mg/dL
Hgb urine dipstick: NEGATIVE
Protein, ur: NEGATIVE mg/dL
Specific Gravity, Urine: 1.025 (ref 1.005–1.030)
Urobilinogen, UA: 4 mg/dL — ABNORMAL HIGH (ref 0.0–1.0)
pH: 6.5 (ref 5.0–8.0)

## 2013-01-22 NOTE — Progress Notes (Signed)
Pt doing well without complaints today. No LOF, no VB, no Ctx, No FM @[redacted]w[redacted]d  Unable to ID FHR, sent to clinic [place] for confirmation - 150 by pulse wave -Pt is getting her diabetes care w/ endocrinologist. Reports on lantus 15U qhs and insulin 3U qac and metformin with Fastings < 90 and PP <120 but did not have log at todays visit. Endocrinologist is aware she is pregnant. -Pt states she now wants Prenatal screening - quad screen/anatomy scan. Scheduled today

## 2013-01-22 NOTE — Progress Notes (Signed)
Pulse 72 Edema trace in hands.

## 2013-01-22 NOTE — Patient Instructions (Signed)
Type 1 or Type 2 Diabetes Mellitus During Pregnancy Diabetes mellitus, often simply referred to as diabetes, is a long-term (chronic) disease. Type 1 diabetes occurs when the islet cells in the pancreas that make insulin (a hormone) are destroyed and can no longer make insulin. Type 2 diabetes occurs when the pancreas does not make enough insulin, the cells are less responsive to the insulin that is made (insulin resistance), or both. Insulin is needed to move sugars from food into the tissue cells. The tissue cells use the sugars for energy. The lack of insulin or the lack of normal response to insulin causes excess sugars to build up in the blood instead of going into the tissue cells. As a result, high blood sugar (hyperglycemia) develops.  If blood glucose levels are kept in the normal range both before and during pregnancy, women can have a healthy pregnancy. If your blood glucose levels are not well controlled, there may be risks to you, your unborn baby (fetus), your labor and delivery, or your newborn baby.  RISK FACTORS  You are predisposed to developing type 1 diabetes if someone in your family has diabetes and you are exposed to certain environmental triggers.  You have an increased chance of developing type 2 diabetes if you have a family history of diabetes and also have one or more of the following risk factors:  Being overweight.  Having an inactive lifestyle.  Having a history of consistently eating high-calorie foods. SYMPTOMS The symptoms of diabetes include:  Increased thirst (polydipsia).  Increased urination (polyuria).  Increased urination during the night (nocturia).  Weight loss. This weight loss may be rapid.  Frequent, recurring infections.  Tiredness (fatigue).  Weakness.  Vision changes, such as blurred vision.  Fruity smell to your breath.  Abdominal pain.  Nausea or vomiting. DIAGNOSIS  Diabetes is diagnosed when blood glucose levels are  increased. Your blood glucose level may be checked by one or more of the following blood tests:  A fasting blood glucose test. You will not be allowed to eat for at least 8 hours before a blood sample is taken.  A random blood glucose test. Your blood glucose is checked at any time of the day regardless of when you ate.  A hemoglobin A1c blood glucose test. A hemoglobin A1c test provides information about blood glucose control over the previous 3 months.  An oral glucose tolerance test (OGTT). Your blood glucose is measured after you have not eaten (fasted) for 1 3 hours and then after you drink a glucose-containing beverage. An OGTT is usually performed during weeks 24 28 of your pregnancy. TREATMENT   You will need to take diabetes medicine or insulin daily to keep blood glucose levels in the desired range.  You will need to match insulin dosing with exercise and healthy food choices. The treatment goal is to maintain the before-meal (preprandial), bedtime, and overnight blood glucose level at 60 99 mg/dL during pregnancy. The treatment goal is to further maintain the peak after-meal blood sugar (postprandial glucose) level at 100 140 mg/dL.  HOME CARE INSTRUCTIONS   Have your hemoglobin A1c level checked twice a year.  Perform daily blood glucose monitoring as directed by your caregiver. It is common to perform frequent blood glucose monitoring.  Monitor urine ketones when you are ill and as directed by your caregiver.  Take your diabetes medicine and insulin as directed by your caregiver to maintain your blood glucose level in the desired range.  Never run out  of diabetes medicine or insulin. It is needed every day.  Adjust insulin based on your intake of carbohydrates. Carbohydrates can raise blood glucose levels but need to be included in your diet. Carbohydrates provide vitamins, minerals, and fiber, which are an essential part of a healthy diet. Carbohydrates are found in  fruits, vegetables, whole grains, dairy products, legumes, and foods containing added sugars.  Eat healthy foods. Alternate 3 meals with 3 snacks.  Maintain a healthy weight gain. The usual total expected weight gain varies according to your prepregnancy body mass index (BMI).  Carry a medical alert card or wear medical alert jewelry.  Carry a 15 gram carbohydrate snack with you at all times to treat low blood sugar (hypoglycemia). Some examples of 15 gram carbohydrate snacks include:  Glucose tablets, 3 or 4.  Glucose gel, 15 gram tube.  Raisins, 2 tablespoons (24 grams).  Jelly beans, 6.  Animal crackers, 8.  Fruit juice, regular soda, or low fat milk, 4 ounces (120 mL).  Gummy treats, 9.  Recognize hypoglycemia. Hypoglycemia during pregnancy occurs with blood glucose levels of 60 mg/dL and below. The risk for hypoglycemia increases when fasting or skipping meals, during or after intense exercise, and during sleep. Hypoglycemia symptoms can include:  Tremors or shakes.  Decreased ability to concentrate.  Sweating.  Increased heart rate.  Headache.  Dry mouth.  Hunger.  Irritability.  Anxiety.  Restless sleep.  Altered speech or coordination.  Confusion.  Treat hypoglycemia promptly. If you are alert and able to safely swallow, follow the 15:15 rule:  Take 15 20 grams of rapid-acting glucose or carbohydrate. Rapid-acting options include glucose gel, glucose tablets, or 4 ounces (120 mL) of fruit juice, regular soda, or low-fat milk.  Check your blood glucose level 15 minutes after taking the glucose.  Take 15 20 grams more of glucose if the repeat blood glucose level is still 70 mg/dL or below.  Eat a meal or snack within 1 hour once blood glucose levels return to normal.  Engage in at least 30 minutes of physical activity a day or as directed by your caregiver. Ten minutes of physical activity timed 30 minutes after each meal is encouraged to control  postprandial blood glucose levels.   Be alert to polyuria and polydipsia, which are early signs of hyperglycemia. An early awareness of hyperglycemia allows for prompt treatment. Treat hyperglycemia as directed by your caregiver.  Adjust your insulin dosing and food intake as needed if you start a new exercise or sport.  Follow your sick day plan at any time you are unable to eat or drink as usual.  Avoid tobacco and alcohol use.  Follow up with your caregiver regularly.  Follow the advice of your caregiver regarding your prenatal and post-delivery (postpartum) appointments, meal planning, exercise, medicines, vitamins, blood tests, other medical tests, and physical activities.  Continue daily skin and foot care. Examine your skin and feet daily for cuts, bruises, redness, nail problems, bleeding, blisters, or sores. A foot exam by a caregiver should be done annually.  Brush your teeth and gums at least twice a day and floss at least once a day. Follow up with your dentist regularly.  Schedule an eye exam during the first trimester of your pregnancy or as directed by your caregiver.  Share your diabetes management plan with your workplace or school.  Stay up-to-date with immunizations.  Learn to manage stress.  Obtain ongoing diabetes education and support as needed. SEEK MEDICAL CARE IF:  You are unable to eat food or drink fluids for more than 6 hours.  You have nausea and vomiting for more than 6 hours.  You have a blood glucose level of 200 mg/dL and you have ketones in your urine.  There is a change in mental status.  You develop vision problems.  You have a persistent headache.  You have upper abdominal pain or discomfort.  You develop an additional serious illness.  You have diarrhea for more than 6 hours.  You have been sick or have had a fever for a couple of days and are not getting better. SEEK IMMEDIATE MEDICAL CARE IF:  You have difficulty  breathing.  You no longer feel the baby moving.  You are bleeding or have discharge from your vagina.  You start having premature contractions or labor. MAKE SURE YOU:  Understand these instructions.  Will watch your condition.  Will get help right away if you are not doing well or get worse. Document Released: 03/15/2012 Document Revised: 06/07/2012 Document Reviewed: 03/15/2012 [facility] Patient Information 2014 [place], [place]. Pregnancy - Second Trimester The second trimester is the period between 13 to 27 weeks of your pregnancy. It is important to follow your doctor's instructions. HOME CARE   Do not smoke.  Do not drink alcohol or use drugs.  Only take medicine as told by your doctor.  Take prenatal vitamins as told. The vitamin should contain 1 milligram of folic acid.  Exercise.  Eat healthy foods. Eat regular, well-balanced meals.  You can have sex (intercourse) if there are no other problems with the pregnancy.  Do not use hot tubs, steam rooms, or saunas.  Wear a seat belt while driving.  Avoid raw meat, uncooked cheese, and litter boxes and soil used by cats.  Visit your dentist. Shirlee [place] are okay. GET HELP RIGHT AWAY IF:   You have a temperature by mouth above 102 F (38.9 C), not controlled by medicine.  Fluid is coming from your vagina.  Blood is coming from your vagina. Light spotting is common, especially after sex (intercourse).  You have a bad smelling fluid (discharge) coming from the vagina. The fluid changes from clear to white.  You still feel sick to your stomach (nauseous).  You throw up (vomit) blood.  You lose or gain more than 2 pounds (0.9 kilograms) of weight in a week, or as suggested by your doctor.  Your face, hands, feet, or legs get puffy (swell).  You get exposed to [place] measles and have never had them.  You get exposed to fifth disease or chickenpox.  You have belly (abdominal) pain.  You have a bad headache  that will not go away.  You have watery poop (diarrhea), pain when you pee (urinate), or have shortness of breath.  You start to have problems seeing (blurry or double vision).  You fall, are in a car accident, or have any kind of trauma.  There is mental or physical violence at home.  You have any concerns or worries during your pregnancy. MAKE SURE YOU:   Understand these instructions.  Will watch your condition.  Will get help right away if you are not doing well or get worse. Document Released: 09/15/2009 Document Revised: 09/13/2011 Document Reviewed: 09/15/2009 [facility] Patient Information 2014 [place], [place].

## 2013-01-24 ENCOUNTER — Ambulatory Visit: Payer: BC Managed Care – PPO | Admitting: *Deleted

## 2013-01-25 ENCOUNTER — Encounter: Payer: Self-pay | Admitting: *Deleted

## 2013-01-25 DIAGNOSIS — O24919 Unspecified diabetes mellitus in pregnancy, unspecified trimester: Secondary | ICD-10-CM

## 2013-01-27 ENCOUNTER — Other Ambulatory Visit (HOSPITAL_COMMUNITY): Payer: Self-pay | Admitting: Advanced Practice Midwife

## 2013-01-30 ENCOUNTER — Encounter: Payer: Self-pay | Admitting: Obstetrics & Gynecology

## 2013-02-01 ENCOUNTER — Inpatient Hospital Stay (HOSPITAL_COMMUNITY)
Admission: AD | Admit: 2013-02-01 | Discharge: 2013-02-01 | Disposition: A | Discharge status: Home / Self Care | Payer: Medicaid Other | Source: Ambulatory Visit | Attending: Obstetrics & Gynecology | Admitting: Obstetrics & Gynecology

## 2013-02-01 ENCOUNTER — Encounter: Payer: Self-pay | Admitting: *Deleted

## 2013-02-01 ENCOUNTER — Encounter (HOSPITAL_COMMUNITY): Payer: Self-pay | Admitting: *Deleted

## 2013-02-01 DIAGNOSIS — O99891 Other specified diseases and conditions complicating pregnancy: Secondary | ICD-10-CM | POA: Insufficient documentation

## 2013-02-01 DIAGNOSIS — O24919 Unspecified diabetes mellitus in pregnancy, unspecified trimester: Secondary | ICD-10-CM

## 2013-02-01 DIAGNOSIS — R109 Unspecified abdominal pain: Secondary | ICD-10-CM | POA: Insufficient documentation

## 2013-02-01 DIAGNOSIS — O24912 Unspecified diabetes mellitus in pregnancy, second trimester: Secondary | ICD-10-CM

## 2013-02-01 DIAGNOSIS — R8271 Bacteriuria: Secondary | ICD-10-CM

## 2013-02-01 DIAGNOSIS — O0990 Supervision of high risk pregnancy, unspecified, unspecified trimester: Secondary | ICD-10-CM

## 2013-02-01 DIAGNOSIS — O26899 Other specified pregnancy related conditions, unspecified trimester: Secondary | ICD-10-CM

## 2013-02-01 LAB — URINALYSIS, ROUTINE W REFLEX MICROSCOPIC
Bilirubin Urine: NEGATIVE
Glucose, UA: NEGATIVE mg/dL
Hgb urine dipstick: NEGATIVE
Protein, ur: NEGATIVE mg/dL

## 2013-02-01 LAB — WET PREP, GENITAL
Trich, Wet Prep: NONE SEEN
Yeast Wet Prep HPF POC: NONE SEEN

## 2013-02-01 NOTE — MAU Provider Note (Signed)
History     CSN: [id number]  Arrival date and time: 02/01/13 1331   None     Chief Complaint  Patient presents with  . Abdominal Cramping   HPIpt is [redacted]w[redacted]d pregnant and presents with sharp abdominal cramping for a couple of days.  Pt denies constipation or diarrhea or UTI sx, spotting, bleeding or LOF. Pt is diabetic and has appointment with Dr. Clearance Coots on 02/07/2013 for her care with him after having initial care with [facility].    MAU Note Service date: 02/01/2013 2:00 PM   Pt reports having sharp abd cramping on and off for the past few days. Denies vaginal discharge and reports pain gets worse when she changes position.     Past Medical History  Diagnosis Date  . Diabetes mellitus   . Gonorrhea   . PCOS (polycystic ovarian syndrome)   . Cushing syndrome     Past Surgical History  Procedure Laterality Date  . Abscess drainage    . Tooth extraction      Family History  Problem Relation Age of Onset  . COPD Maternal Grandmother   . Hypertension Maternal Grandmother   . Heart disease Maternal Grandmother   . Epilepsy Father     History  Substance Use Topics  . Smoking status: Former Smoker    Quit date: 12/13/2012  . Smokeless tobacco: Never Used  . Alcohol Use: No    Allergies: No Known Allergies  Prescriptions prior to admission  Medication Sig Dispense Refill  . insulin aspart (NOVOLOG) 100 UNIT/ML injection Inject 3 Units into the skin 3 (three) times daily with meals.      . insulin glargine (LANTUS) 100 UNIT/ML injection Inject 15 Units into the skin at bedtime.      . metFORMIN (GLUCOPHAGE) 500 MG tablet Take 500 mg by mouth 2 (two) times daily with a meal.      . Prenatal Vit-Fe Fumarate-FA (PRENATAL VITAMINS) 28-0.8 MG TABS Take 1 tablet by mouth daily.  30 tablet  11  . cephALEXin (KEFLEX) 500 MG capsule Take 1 capsule (500 mg total) by mouth 4 (four) times daily.  28 capsule  2  . permethrin (ELIMITE) 5 % cream Apply to entire body other than face - let  sit for 14 hours then wash off, may repeat in 1 week if still having symptoms  60 g  1    Review of Systems  Constitutional: Negative for fever and chills.  Gastrointestinal: Positive for abdominal pain. Negative for nausea, vomiting, diarrhea and constipation.  Genitourinary: Negative for urgency.   Physical Exam   Blood pressure 124/60, pulse 89, temperature 98.4 F (36.9 C), temperature source Oral, resp. rate 18, height 5\' 7"  (1.702 m), weight 135.263 kg (298 lb 3.2 oz), last menstrual period 10/04/2012.  Physical Exam  Nursing note and vitals reviewed. Constitutional: She is oriented to person, place, and time. She appears well-developed and well-nourished. No distress.  HENT:  Head: Normocephalic.  Eyes: Pupils are equal, round, and reactive to light.  Neck: Normal range of motion. Neck supple.  Cardiovascular: Normal rate.   Respiratory: Effort normal.  GI: Soft. She exhibits no distension. There is tenderness. There is no rebound and no guarding.  FHT audible with doppler  Genitourinary: Vagina normal.  Cervix closed, long; uterus gravid NSSC   Musculoskeletal: Normal range of motion.  Neurological: She is alert and oriented to person, place, and time.  Skin: Skin is warm and dry.  Psychiatric: She has a normal mood and  affect.    MAU Course  Procedures Results for orders placed during the hospital encounter of 02/01/13 (from the past 24 hour(s))  URINALYSIS, ROUTINE W REFLEX MICROSCOPIC     Status: Abnormal   Collection Time    02/01/13  2:26 PM      Result Value Range   Color, Urine YELLOW  YELLOW   APPearance CLEAR  CLEAR   Specific Gravity, Urine 1.025  1.005 - 1.030   pH 6.0  5.0 - 8.0   Glucose, UA NEGATIVE  NEGATIVE mg/dL   Hgb urine dipstick NEGATIVE  NEGATIVE   Bilirubin Urine NEGATIVE  NEGATIVE   Ketones, ur 15 (*) NEGATIVE mg/dL   Protein, ur NEGATIVE  NEGATIVE mg/dL   Urobilinogen, UA 2.0 (*) 0.0 - 1.0 mg/dL   Nitrite NEGATIVE  NEGATIVE    Leukocytes, UA NEGATIVE  NEGATIVE  WET PREP, GENITAL     Status: Abnormal   Collection Time    02/01/13  3:10 PM      Result Value Range   Yeast Wet Prep HPF POC NONE SEEN  NONE SEEN   Trich, Wet Prep NONE SEEN  NONE SEEN   Clue Cells Wet Prep HPF POC NONE SEEN  NONE SEEN   WBC, Wet Prep HPF POC MODERATE (*) NONE SEEN   Pt declines anything for pain Assessment and Plan  abd pain in pregnancy ?round ligament F/u with Dr. Verdell Carmine office scheduled  Pamelia Hoit 02/01/2013, 3:01 PM

## 2013-02-01 NOTE — MAU Provider Note (Signed)
Attestation of Attending Supervision of Obstetric Fellow: Evaluation and management procedures were performed by the Obstetric Fellow under my supervision and collaboration.  I have reviewed the Obstetric Fellow's note and chart, and I agree with the management and plan.  Dave Mergen, MD, FACOG Attending Obstetrician & Gynecologist Faculty Practice, [facility]   

## 2013-02-01 NOTE — MAU Note (Signed)
Pt reports having sharp abd cramping on and off for the past few days. Denies vaginal discharge and reports pain gets worse when she changes position.

## 2013-02-02 ENCOUNTER — Encounter: Payer: Self-pay | Admitting: *Deleted

## 2013-02-02 LAB — GC/CHLAMYDIA PROBE AMP
CT Probe RNA: NEGATIVE
GC Probe RNA: NEGATIVE

## 2013-02-05 ENCOUNTER — Emergency Department (HOSPITAL_COMMUNITY)
Admission: EM | Admit: 2013-02-05 | Discharge: 2013-02-05 | Disposition: A | Discharge status: Home / Self Care | Payer: BC Managed Care – PPO | Attending: Emergency Medicine | Admitting: Emergency Medicine

## 2013-02-05 ENCOUNTER — Encounter (HOSPITAL_COMMUNITY): Payer: Self-pay | Admitting: Emergency Medicine

## 2013-02-05 DIAGNOSIS — K089 Disorder of teeth and supporting structures, unspecified: Secondary | ICD-10-CM | POA: Insufficient documentation

## 2013-02-05 DIAGNOSIS — O24919 Unspecified diabetes mellitus in pregnancy, unspecified trimester: Secondary | ICD-10-CM | POA: Insufficient documentation

## 2013-02-05 DIAGNOSIS — Z872 Personal history of diseases of the skin and subcutaneous tissue: Secondary | ICD-10-CM | POA: Insufficient documentation

## 2013-02-05 DIAGNOSIS — Z794 Long term (current) use of insulin: Secondary | ICD-10-CM | POA: Insufficient documentation

## 2013-02-05 DIAGNOSIS — E119 Type 2 diabetes mellitus without complications: Secondary | ICD-10-CM | POA: Insufficient documentation

## 2013-02-05 DIAGNOSIS — Z8619 Personal history of other infectious and parasitic diseases: Secondary | ICD-10-CM | POA: Insufficient documentation

## 2013-02-05 DIAGNOSIS — K0889 Other specified disorders of teeth and supporting structures: Secondary | ICD-10-CM

## 2013-02-05 DIAGNOSIS — O9989 Other specified diseases and conditions complicating pregnancy, childbirth and the puerperium: Secondary | ICD-10-CM | POA: Insufficient documentation

## 2013-02-05 DIAGNOSIS — Z8639 Personal history of other endocrine, nutritional and metabolic disease: Secondary | ICD-10-CM | POA: Insufficient documentation

## 2013-02-05 DIAGNOSIS — Z79899 Other long term (current) drug therapy: Secondary | ICD-10-CM | POA: Insufficient documentation

## 2013-02-05 DIAGNOSIS — Z87891 Personal history of nicotine dependence: Secondary | ICD-10-CM | POA: Insufficient documentation

## 2013-02-05 DIAGNOSIS — Z862 Personal history of diseases of the blood and blood-forming organs and certain disorders involving the immune mechanism: Secondary | ICD-10-CM | POA: Insufficient documentation

## 2013-02-05 MED ORDER — HYDROCODONE-ACETAMINOPHEN 5-325 MG PO TABS: 2.0000 | ORAL_TABLET | ORAL | Status: DC | PRN

## 2013-02-05 MED ORDER — PENICILLIN V POTASSIUM 500 MG PO TABS
500.0000 mg | ORAL_TABLET | Freq: Once | ORAL | Status: AC
  Administered 2013-02-05: 500 mg via ORAL
  Filled 2013-02-05: qty 1

## 2013-02-05 MED ORDER — PENICILLIN V POTASSIUM 500 MG PO TABS: 500.0000 mg | ORAL_TABLET | Freq: Three times a day (TID) | ORAL | Status: DC

## 2013-02-05 NOTE — ED Notes (Signed)
Pt c/o l side dental pain x3 days.

## 2013-02-05 NOTE — ED Provider Notes (Signed)
CSN: [id number]     Arrival date & time 02/05/13  1723 History  This chart was scribed for non-physician practitioner, Fayrene Helper, PA-C working with Claudean Kinds, MD by Greggory Stallion, ED scribe. This patient was seen in room WTR6/WTR6 and the patient's care was started at 5:29 PM.   Chief Complaint  Patient presents with  . Dental Pain   Patient is a 22 y.o. female presenting with tooth pain. The history is provided by the patient. No language interpreter was used.  Dental Pain Location:  Lower Severity:  Moderate Onset quality:  Gradual Duration:  3 days Timing:  Constant Progression:  Unchanged Chronicity:  New Context: not abscess   Relieved by:  Nothing Ineffective treatments:  Acetaminophen Associated symptoms: no fever     HPI Comments: [name] [name] is a 22 y.o. female who presents to the Emergency Department complaining of gradual onset, constant lower left dental pain that started 3 days ago. Pt states had tried Tylenol with no relief. Pt is [redacted] weeks pregnant. Pt denies fever as an associated symptom. She states she tried to call a dentist but couldn't get an appointment.   Past Medical History  Diagnosis Date  . Diabetes mellitus   . Gonorrhea   . PCOS (polycystic ovarian syndrome)   . Cushing syndrome    Past Surgical History  Procedure Laterality Date  . Abscess drainage    . Tooth extraction     Family History  Problem Relation Age of Onset  . COPD Maternal Grandmother   . Hypertension Maternal Grandmother   . Heart disease Maternal Grandmother   . Epilepsy Father    History  Substance Use Topics  . Smoking status: Former Smoker    Quit date: 12/13/2012  . Smokeless tobacco: Never Used  . Alcohol Use: No   OB History   Grav Para Term Preterm Abortions TAB SAB Ect Mult Living   2 0 0 0 1 0 1 0 0 0      Review of Systems  Constitutional: Negative for fever.  HENT: Positive for dental problem.   All other systems reviewed and are  negative.    Allergies  Review of patient's allergies indicates no known allergies.  Home Medications   Current Outpatient Rx  Name  Route  Sig  Dispense  Refill  . cephALEXin (KEFLEX) 500 MG capsule   Oral   Take 1 capsule (500 mg total) by mouth 4 (four) times daily.   28 capsule   2   . insulin aspart (NOVOLOG) 100 UNIT/ML injection   Subcutaneous   Inject 3 Units into the skin 3 (three) times daily with meals.         . insulin glargine (LANTUS) 100 UNIT/ML injection   Subcutaneous   Inject 15 Units into the skin at bedtime.         . metFORMIN (GLUCOPHAGE) 500 MG tablet   Oral   Take 500 mg by mouth 2 (two) times daily with a meal.         . permethrin (ELIMITE) 5 % cream      Apply to entire body other than face - let sit for 14 hours then wash off, may repeat in 1 week if still having symptoms   60 g   1   . Prenatal Vit-Fe Fumarate-FA (PRENATAL VITAMINS) 28-0.8 MG TABS   Oral   Take 1 tablet by mouth daily.   30 tablet   11    BP 125/81  Pulse 90  Temp(Src) 98.8 F (37.1 C) (Oral)  Resp 18  Ht 5\' 7"  (1.702 m)  Wt 298 lb (135.172 kg)  BMI 46.66 kg/m2  SpO2 100%  LMP 10/04/2012  Physical Exam  Nursing note and vitals reviewed. Constitutional: She is oriented to person, place, and time. She appears well-developed and well-nourished. No distress.  HENT:  Head: Normocephalic and atraumatic.  Tenderness upon palpation to left lower canine without evidence of dental decay, gingivitis, or abscess.  Eyes: EOM are normal.  Neck: Neck supple. No tracheal deviation present.  Cardiovascular: Normal rate.   Pulmonary/Chest: Effort normal. No respiratory distress.  Musculoskeletal: Normal range of motion.  Neurological: She is alert and oriented to person, place, and time.  Skin: Skin is warm and dry.  Psychiatric: She has a normal mood and affect. Her behavior is normal.    ED Course   Procedures (including critical care time)  DIAGNOSTIC  STUDIES: Oxygen Saturation is 100% on RA, normal by my interpretation.    COORDINATION OF CARE: 5:40 PM-Discussed treatment plan which includes antibiotic and salt water gargles with pt at bedside and pt agreed to plan. Advised pt to follow up with dentist this week.   6:01 PM Pt request for pain medication.  i discussed risk/benefit, and pt voice understanding and acknowledge risk.  vicodin prescribed along with penicillin.    Labs Reviewed - No data to display No results found. 1. Pain, dental     MDM  BP 125/62  Pulse 86  Temp(Src) 98 F (36.7 C) (Oral)  Resp 16  Ht 5\' 7"  (1.702 m)  Wt 298 lb (135.172 kg)  BMI 46.66 kg/m2  SpO2 99%  LMP 10/04/2012   I personally performed the services described in this documentation, which was scribed in my presence. The recorded information has been reviewed and is accurate.    Fayrene Helper, PA-C 02/05/13 1754  Fayrene Helper, PA-C 02/05/13 1801

## 2013-02-06 NOTE — ED Provider Notes (Signed)
Medical screening examination/treatment/procedure(s) were performed by non-physician practitioner and as supervising physician I was immediately available for consultation/collaboration.   Dajanay Northrup Joseph Laiklynn Raczynski, MD 02/06/13 1642 

## 2013-02-07 ENCOUNTER — Encounter: Payer: Self-pay | Admitting: Obstetrics

## 2013-02-07 ENCOUNTER — Ambulatory Visit (INDEPENDENT_AMBULATORY_CARE_PROVIDER_SITE_OTHER): Payer: Medicaid Other | Admitting: Obstetrics

## 2013-02-07 VITALS — BP 123/71 | Temp 98.8°F | Wt 291.2 lb

## 2013-02-07 DIAGNOSIS — O24912 Unspecified diabetes mellitus in pregnancy, second trimester: Secondary | ICD-10-CM

## 2013-02-07 DIAGNOSIS — O24919 Unspecified diabetes mellitus in pregnancy, unspecified trimester: Secondary | ICD-10-CM

## 2013-02-07 DIAGNOSIS — Z3201 Encounter for pregnancy test, result positive: Secondary | ICD-10-CM

## 2013-02-07 DIAGNOSIS — Z3402 Encounter for supervision of normal first pregnancy, second trimester: Secondary | ICD-10-CM

## 2013-02-07 LAB — POCT URINALYSIS DIPSTICK
Bilirubin, UA: NEGATIVE
Blood, UA: NEGATIVE
Glucose, UA: NEGATIVE
Nitrite, UA: NEGATIVE
Spec Grav, UA: 1.025
pH, UA: 5

## 2013-02-07 MED ORDER — VITAFOL ULTRA 29-0.6-0.4-200 MG PO CAPS: 1.0000 | ORAL_CAPSULE | Freq: Every day | ORAL | Status: DC

## 2013-02-07 NOTE — Progress Notes (Signed)
Pulse-102 Pt requesting refill of PVI.     Subjective:    [name] [name] is being seen today for her first obstetrical visit.  This is a planned pregnancy. She is at [redacted]w[redacted]d gestation. Her obstetrical history is significant for obesity. Relationship with FOB: significant other, not living together. Patient does intend to breast feed. Pregnancy history fully reviewed.  Menstrual History: OB History   Grav Para Term Preterm Abortions TAB SAB Ect Mult Living   2 0 0 0 1 0 1 0 0 0       Menarche age: 67  Patient's last menstrual period was 10/04/2012.    The following portions of the patient's history were reviewed and updated as appropriate: allergies, current medications, past family history, past medical history, past social history, past surgical history and problem list.  Review of Systems Pertinent items are noted in HPI.    Objective:    General appearance: alert and no distress Abdomen: normal findings: soft, non-tender Pelvic: cervix normal in appearance, external genitalia normal, no adnexal masses or tenderness, no cervical motion tenderness, uterus normal size, shape, and consistency and vagina normal without discharge    Assessment:    Pregnancy at [redacted]w[redacted]d weeks    Plan:    Initial labs drawn. Prenatal vitamins.  Counseling provided regarding continued use of seat belts, cessation of alcohol consumption, smoking or use of illicit drugs; infection precautions i.e., influenza/TDAP immunizations, toxoplasmosis,CMV, parvovirus, listeria and varicella; workplace safety, exercise during pregnancy; routine dental care, safe medications, sexual activity, hot tubs, saunas, pools, travel, caffeine use, fish and methlymercury, potential toxins, hair treatments, varicose veins Weight gain recommendations reviewed: underweight/BMI< 18.5--> gain 28 - 40 lbs; normal weight/BMI 18.5 - 24.9--> gain 25 - 35 lbs; overweight/BMI 25 - 29.9--> gain 15 - 25 lbs; obese/BMI >30->gain  11 - 20  lbs Problem list reviewed and updated. AFP3 discussed: requested. Role of ultrasound in pregnancy discussed; fetal survey: requested. Amniocentesis discussed: not indicated. Follow up in 4 weeks. 50% of 20 min visit spent on counseling and coordination of care.

## 2013-02-08 ENCOUNTER — Encounter: Payer: Self-pay | Admitting: Obstetrics

## 2013-02-08 LAB — AFP, QUAD SCREEN
Down Syndrome Scr Risk Est: 1:22900 {titer}
HCG, Total: 15304 m[IU]/mL
INH: 108.9 pg/mL
Interpretation-AFP: NEGATIVE
MoM for INH: 0.84
MoM for hCG: 0.89
Open Spina bifida: NEGATIVE
Osb Risk: 1:8750 {titer}
Tri 18 Scr Risk Est: NEGATIVE
uE3 Mom: 1.22
uE3 Value: 0.5 ng/mL

## 2013-02-08 LAB — VARICELLA ZOSTER ANTIBODY, IGG: Varicella IgG: 690.8 Index — ABNORMAL HIGH (ref <135.00)

## 2013-02-11 ENCOUNTER — Encounter (HOSPITAL_COMMUNITY): Payer: Self-pay | Admitting: Emergency Medicine

## 2013-02-11 ENCOUNTER — Emergency Department (HOSPITAL_COMMUNITY)
Admission: EM | Admit: 2013-02-11 | Discharge: 2013-02-11 | Disposition: A | Discharge status: Home / Self Care | Payer: Medicaid Other | Attending: Emergency Medicine | Admitting: Emergency Medicine

## 2013-02-11 DIAGNOSIS — K047 Periapical abscess without sinus: Secondary | ICD-10-CM

## 2013-02-11 DIAGNOSIS — E119 Type 2 diabetes mellitus without complications: Secondary | ICD-10-CM | POA: Insufficient documentation

## 2013-02-11 DIAGNOSIS — E249 Cushing's syndrome, unspecified: Secondary | ICD-10-CM | POA: Insufficient documentation

## 2013-02-11 DIAGNOSIS — Z87891 Personal history of nicotine dependence: Secondary | ICD-10-CM | POA: Insufficient documentation

## 2013-02-11 DIAGNOSIS — H9209 Otalgia, unspecified ear: Secondary | ICD-10-CM | POA: Insufficient documentation

## 2013-02-11 DIAGNOSIS — R209 Unspecified disturbances of skin sensation: Secondary | ICD-10-CM | POA: Insufficient documentation

## 2013-02-11 DIAGNOSIS — Z794 Long term (current) use of insulin: Secondary | ICD-10-CM | POA: Insufficient documentation

## 2013-02-11 DIAGNOSIS — Z79899 Other long term (current) drug therapy: Secondary | ICD-10-CM | POA: Insufficient documentation

## 2013-02-11 MED ORDER — PENICILLIN V POTASSIUM 500 MG PO TABS: 500.0000 mg | ORAL_TABLET | Freq: Three times a day (TID) | ORAL | Status: DC

## 2013-02-11 NOTE — ED Provider Notes (Signed)
CSN: [id number]     Arrival date & time 02/11/13  1456 History  This chart was scribed for [name], non-physician practitioner working with [name], MD by [name], ED Scribe. This patient was seen in room TR06C/TR06C and the patient's care was started at 1456.    Chief Complaint  Patient presents with  . Dental Pain    The history is provided by the patient. No language interpreter was used.    HPI Comments: [name] [name] is a 22 y.o. female who presents to the Emergency Department complaining of ongoing, constant, gradually worsening left sided dental pain starting 1 week ago. She describes the dental pain as throbbing. Pt also has associated left ear and left sided facial pain. Pt was seen last week for similar symptoms and was prescribed antibiotics which she has been compliant with. Pt denies fevers.    Past Medical History  Diagnosis Date  . Diabetes mellitus   . Gonorrhea   . PCOS (polycystic ovarian syndrome)   . Cushing syndrome   . Hirsutism   . Diabetes mellitus, antepartum    Past Surgical History  Procedure Laterality Date  . Abscess drainage    . Tooth extraction     Family History  Problem Relation Age of Onset  . COPD Maternal Grandmother   . Hypertension Maternal Grandmother   . Heart disease Maternal Grandmother   . Epilepsy Father   . Cancer Paternal Grandfather    History  Substance Use Topics  . Smoking status: Former Smoker    Quit date: 12/13/2012  . Smokeless tobacco: Never Used  . Alcohol Use: No   OB History   Grav Para Term Preterm Abortions TAB SAB Ect Mult Living   2 0 0 0 1 0 1 0 0 0      Review of Systems  HENT: Positive for ear pain (left ear pain), facial swelling and dental problem.   All other systems reviewed and are negative.    Allergies  Review of patient's allergies indicates no known allergies.  Home Medications   Current Outpatient Rx  Name  Route  Sig  Dispense  Refill  . acetaminophen  (TYLENOL) 500 MG tablet   Oral   Take 500 mg by mouth every 6 (six) hours as needed for pain.         Marland Kitchen insulin glargine (LANTUS) 100 UNIT/ML injection   Subcutaneous   Inject 15 Units into the skin at bedtime.         . insulin lispro (HUMALOG KWIKPEN) 100 UNIT/ML SOPN   Subcutaneous   Inject 3 Units into the skin 3 (three) times daily with meals.         . metFORMIN (GLUCOPHAGE) 500 MG tablet   Oral   Take 500 mg by mouth 2 (two) times daily with a meal.         . penicillin v potassium (VEETID) 500 MG tablet   Oral   Take 1 tablet (500 mg total) by mouth 3 (three) times daily.   30 tablet   0   . Prenat-Fe Poly-Methfol-FA-DHA (VITAFOL ULTRA) 29-0.6-0.4-200 MG CAPS   Oral   Take 1 capsule by mouth daily before breakfast.   90 capsule   3   . HYDROcodone-acetaminophen (NORCO/VICODIN) 5-325 MG per tablet   Oral   Take 2 tablets by mouth every 4 (four) hours as needed for pain.   10 tablet   0    BP 109/67  Pulse 98  Temp(Src) 98.5 F (36.9 C) (Oral)  Resp 18  SpO2 100%  LMP 10/04/2012 Physical Exam  Nursing note and vitals reviewed. Constitutional: She is oriented to person, place, and time. She appears well-developed and well-nourished.  HENT:  Head: Normocephalic and atraumatic.  Nondraining abscess adjacent to number 21. Minimal left facial swelling. Left TM clear. No pre or post auricular adenopathy.   Eyes: Conjunctivae and EOM are normal. Pupils are equal, round, and reactive to light.  Neck: Normal range of motion. Neck supple.  Pulmonary/Chest: Effort normal.  Musculoskeletal: Normal range of motion.  Neurological: She is alert and oriented to person, place, and time.  Skin: Skin is warm and dry.  Psychiatric: She has a normal mood and affect.    ED Course   Procedures (including critical care time) Marcaine used by local injection to numb tooth for pain relief with good analgesia. Abscess successfully drained using 18G needle. DIAGNOSTIC  STUDIES: Oxygen Saturation is 100% on RA, normal by my interpretation.    COORDINATION OF CARE: 3:44 PM Discussed treatment plan with pt at bedside and pt agreed to plan.   Labs Reviewed - No data to display No results found. No diagnosis found.  MDM  Persistent dental abscess, successfully drained in ED.  I personally performed the services described in this documentation, which was scribed in my presence. The recorded information has been reviewed and is accurate.     Arnoldo Hooker, PA-C 02/11/13 1711

## 2013-02-11 NOTE — ED Notes (Signed)
Patient presents to ED today with c/o left side of mouth hurting and left ear. Patient was seen last week for this and took antibiotics.

## 2013-02-12 NOTE — ED Provider Notes (Signed)
Medical screening examination/treatment/procedure(s) were performed by non-physician practitioner and as supervising physician I was immediately available for consultation/collaboration.   Danniel Tones H Johneisha Broaden, MD 02/12/13 0004 

## 2013-02-16 ENCOUNTER — Encounter (HOSPITAL_COMMUNITY): Payer: Self-pay | Admitting: Obstetrics

## 2013-02-19 ENCOUNTER — Encounter: Payer: BC Managed Care – PPO | Admitting: Obstetrics & Gynecology

## 2013-02-22 ENCOUNTER — Encounter (HOSPITAL_COMMUNITY): Payer: Self-pay | Admitting: *Deleted

## 2013-02-22 ENCOUNTER — Ambulatory Visit (HOSPITAL_COMMUNITY)
Admission: RE | Admit: 2013-02-22 | Discharge: 2013-02-22 | Disposition: A | Discharge status: Home / Self Care | Payer: Medicaid Other | Source: Ambulatory Visit | Attending: Obstetrics | Admitting: Obstetrics

## 2013-02-22 NOTE — Consult Note (Signed)
[name] [name] comes to MFM accompanied by her family. Patients is very upset that she is here to have Diabetes Educations since she already sees Dr. Talmage Nap hand has received education through [facility]. She met with Denny Levy RD . She is testing 3Xdaily, complying with carbohydrate modifications. Prior to conception, patients was Diet controlled T2DM. Per patient, after conception patient was seen by Dr. Talmage Nap and was placed on Metformin, Humalog and Lantus.. I questioned if possibly she was here to have her Lantus switched to NPH but she stated that Lantus was Dr.Balan's choice. I assured her that Dr. Talmage Nap is the expert and she would choose the appropriate medication regimen. After a brief discussion about the patients DM knowledged I determined that no further DM Education was warranted at this time.

## 2013-02-26 ENCOUNTER — Other Ambulatory Visit: Payer: Self-pay | Admitting: Obstetrics

## 2013-02-26 ENCOUNTER — Ambulatory Visit (INDEPENDENT_AMBULATORY_CARE_PROVIDER_SITE_OTHER): Payer: BC Managed Care – PPO | Admitting: Obstetrics

## 2013-02-26 ENCOUNTER — Encounter: Payer: Self-pay | Admitting: Obstetrics

## 2013-02-26 VITALS — BP 109/71 | Temp 98.4°F | Wt 299.2 lb

## 2013-02-26 DIAGNOSIS — O24919 Unspecified diabetes mellitus in pregnancy, unspecified trimester: Secondary | ICD-10-CM

## 2013-02-26 DIAGNOSIS — E240 Pituitary-dependent Cushing's disease: Secondary | ICD-10-CM

## 2013-02-26 DIAGNOSIS — Z3402 Encounter for supervision of normal first pregnancy, second trimester: Secondary | ICD-10-CM

## 2013-02-26 DIAGNOSIS — Z3400 Encounter for supervision of normal first pregnancy, unspecified trimester: Secondary | ICD-10-CM

## 2013-02-26 LAB — POCT URINALYSIS DIPSTICK
Blood, UA: NEGATIVE
Glucose, UA: NEGATIVE
Spec Grav, UA: 1.01

## 2013-02-26 NOTE — Progress Notes (Signed)
Pulse: 86

## 2013-02-27 ENCOUNTER — Encounter: Payer: Self-pay | Admitting: Obstetrics

## 2013-02-27 ENCOUNTER — Ambulatory Visit (HOSPITAL_COMMUNITY)
Admission: RE | Admit: 2013-02-27 | Discharge: 2013-02-27 | Disposition: A | Discharge status: Home / Self Care | Payer: BC Managed Care – PPO | Source: Ambulatory Visit | Attending: Obstetrics | Admitting: Obstetrics

## 2013-02-27 VITALS — BP 127/66 | HR 90 | Wt 299.0 lb

## 2013-02-27 DIAGNOSIS — R8271 Bacteriuria: Secondary | ICD-10-CM

## 2013-02-27 DIAGNOSIS — Z1389 Encounter for screening for other disorder: Secondary | ICD-10-CM | POA: Insufficient documentation

## 2013-02-27 DIAGNOSIS — O24919 Unspecified diabetes mellitus in pregnancy, unspecified trimester: Secondary | ICD-10-CM | POA: Insufficient documentation

## 2013-02-27 DIAGNOSIS — E240 Pituitary-dependent Cushing's disease: Secondary | ICD-10-CM

## 2013-02-27 DIAGNOSIS — O24912 Unspecified diabetes mellitus in pregnancy, second trimester: Secondary | ICD-10-CM

## 2013-02-27 DIAGNOSIS — O0990 Supervision of high risk pregnancy, unspecified, unspecified trimester: Secondary | ICD-10-CM

## 2013-02-27 DIAGNOSIS — Z363 Encounter for antenatal screening for malformations: Secondary | ICD-10-CM | POA: Insufficient documentation

## 2013-02-27 DIAGNOSIS — E669 Obesity, unspecified: Secondary | ICD-10-CM | POA: Insufficient documentation

## 2013-02-27 DIAGNOSIS — O358XX0 Maternal care for other (suspected) fetal abnormality and damage, not applicable or unspecified: Secondary | ICD-10-CM | POA: Insufficient documentation

## 2013-02-27 LAB — US OB DETAIL + 14 WK

## 2013-02-27 NOTE — Progress Notes (Signed)
MATERNAL FETAL MEDICINE CONSULT  Patient Name: [name] [name] Medical Record Number:  [id number] Date of Birth: 02-24-1991 Requesting Physician Name:  Brock Bad, MD Date of Service: 02/27/2013  Chief Complaint Diabetes mellitus in pregnancy  History of Present Illness [name] [name] was seen today secondary to diabetes mellitus in pregnancy at the request of Brock Bad, MD.  The patient is a 22 y.o. G2P0010,at [redacted]w[redacted]d with an EDD of 07/25/2013, by Ultrasound dating method.  She was diagnosed with DM slightly less than one year ago.  She has been under the care of Dr. Talmage Nap in Endocrinology.  She currently takes Lantus 15 units [place] daily, Humalog 3 units  qac, and metformin 500 mg po bid.  She has already had diabetic and dietary counseling.  [name] [name] also carries the diagnosis of Cushing Syndrome which was also discovered slightly less than one year ago.  However, she has not any further workup since diagnosis nor has she discussed this diagnosis with Dr. Talmage Nap in some time or receiving treatment for it.  Review of Systems Pertinent items are noted in HPI.  Patient History OB History  Gravida Para Term Preterm AB SAB TAB Ectopic Multiple Living  2 0 0 0 1 1 0 0 0 0     # Outcome Date GA Lbr Len/2nd Weight Sex Delivery Anes PTL Lv  2 CUR           1 SAB 09/2012 [redacted]w[redacted]d            Comments: System Generated. Please review and update pregnancy details.      Past Medical History  Diagnosis Date  . Diabetes mellitus   . Gonorrhea   . PCOS (polycystic ovarian syndrome)   . Cushing syndrome   . Hirsutism   . Diabetes mellitus, antepartum     Past Surgical History  Procedure Laterality Date  . Abscess drainage    . Tooth extraction      History   Social History  . Marital Status: Single    Spouse Name: N/A    Number of Children: N/A  . Years of Education: N/A   Social History Main Topics  . Smoking status: Former Smoker    Quit date: 12/13/2012  . Smokeless  tobacco: Never Used  . Alcohol Use: No  . Drug Use: No  . Sexual Activity: Yes    Partners: Male    Birth Control/ Protection: None   Other Topics Concern  . Not on file   Social History Narrative  . No narrative on file    Family History  Problem Relation Age of Onset  . COPD Maternal Grandmother   . Hypertension Maternal Grandmother   . Heart disease Maternal Grandmother   . Epilepsy Father   . Cancer Paternal Grandfather    In addition, the patient has no family history of mental retardation, birth defects, or genetic diseases.  Physical Examination Vitals - BP 127/66, Pulse 90, Weight 299 lbs General appearance - alert, well appearing, and in no distress  Assessment and Recommendations 1.  Diabetes mellitus in pregnancy.  [name] [name] is already under Dr. Willeen Cass care, so I will defer medication management to her.  I reviewed the goals of therapy including fasting blood sugars less than 90 mg/dl and 2-hour postprandial less than 120 mg/dl with avoidance of hypoglycemia.  We discussed that pre-gestational DM is associated with an increase in birth defects.  She had a detailed fetal anatomic survey performed today, which remains incomplete  due to fetal position.  However, no gross abnormalities were identified.  As DM is associated with fetal growth disturbance [name] [name] should have serial growth scans every 4 weeks.  The first of these follow-up scans was scheduled in the [facility] today.  A fetal echocardiogram has also been ordered to fully assess for fetal cardiac anomalies.  In addition, to determine a baseline and assess for occult proteinuria [name] [name] should have a 24 hour urine collection for protein.  Finally, twice weekly fetal surveillance should be started at 32 weeks, with delivery at 39 weeks provided glycemic control remains adequate and no other problems arise. 2.  Possible Cushing Syndrome.  [name] [name] reports of the diagnosis are rather vague.  In addition, the lack of  further workup and treatment from Dr. Talmage Nap raises doubts that she actually has Cushing Syndrome and even if she does it is likely very mild and will not impact pregnancy beyond the risks associated with DM discussed above.  If you have questions about this diagnosis, I recommend you contact Dr. Talmage Nap and inquire further.  I spent 30 minutes with [name] [name] today of which 50% was face-to-face counseling.  Thank you for referring [name] [name] to the [facility].  Please do not hesitate to contact us with questions.   Rema Fendt, MD

## 2013-03-12 ENCOUNTER — Other Ambulatory Visit: Payer: Self-pay

## 2013-03-12 ENCOUNTER — Encounter: Payer: Self-pay | Admitting: Obstetrics

## 2013-03-23 ENCOUNTER — Encounter: Payer: Self-pay | Admitting: *Deleted

## 2013-03-26 ENCOUNTER — Encounter: Payer: Self-pay | Admitting: Obstetrics

## 2013-03-26 ENCOUNTER — Ambulatory Visit (INDEPENDENT_AMBULATORY_CARE_PROVIDER_SITE_OTHER): Payer: Medicaid Other | Admitting: Obstetrics

## 2013-03-26 ENCOUNTER — Ambulatory Visit (HOSPITAL_COMMUNITY)
Admission: RE | Admit: 2013-03-26 | Discharge: 2013-03-26 | Disposition: A | Discharge status: Home / Self Care | Payer: BC Managed Care – PPO | Source: Ambulatory Visit | Attending: Maternal and Fetal Medicine | Admitting: Maternal and Fetal Medicine

## 2013-03-26 VITALS — BP 126/72 | HR 93 | Wt 299.0 lb

## 2013-03-26 VITALS — BP 116/76 | Temp 98.2°F | Wt 299.8 lb

## 2013-03-26 DIAGNOSIS — O24912 Unspecified diabetes mellitus in pregnancy, second trimester: Secondary | ICD-10-CM

## 2013-03-26 DIAGNOSIS — E669 Obesity, unspecified: Secondary | ICD-10-CM | POA: Insufficient documentation

## 2013-03-26 DIAGNOSIS — Z3402 Encounter for supervision of normal first pregnancy, second trimester: Secondary | ICD-10-CM

## 2013-03-26 DIAGNOSIS — O24919 Unspecified diabetes mellitus in pregnancy, unspecified trimester: Secondary | ICD-10-CM | POA: Insufficient documentation

## 2013-03-26 DIAGNOSIS — Z3689 Encounter for other specified antenatal screening: Secondary | ICD-10-CM | POA: Insufficient documentation

## 2013-03-26 DIAGNOSIS — Z3400 Encounter for supervision of normal first pregnancy, unspecified trimester: Secondary | ICD-10-CM

## 2013-03-26 MED ORDER — PRENATAL PLUS 27-1 MG PO TABS
1.0000 | ORAL_TABLET | Freq: Every day | ORAL | Status: DC
Start: 2013-03-26 — End: 2013-05-07

## 2013-03-26 NOTE — Progress Notes (Signed)
Pulse: 93

## 2013-03-26 NOTE — Progress Notes (Signed)
[name] [name]  was seen today for an ultrasound appointment.  See full report in [facility].  Impression: Single IUP at 22 5/7 weeks Normal interval anatomy - somewhat limited views of the fetal face obtained (profile) Interval growth is appropriate (52nd %tile) Normal amniotic fluid volume  Recommendations: Recommend follow-up ultrasound examination in 4 weeks.  Alpha Gula, MD

## 2013-03-31 ENCOUNTER — Encounter (HOSPITAL_COMMUNITY): Payer: Self-pay | Admitting: *Deleted

## 2013-03-31 ENCOUNTER — Inpatient Hospital Stay (HOSPITAL_COMMUNITY)
Admission: AD | Admit: 2013-03-31 | Discharge: 2013-03-31 | Disposition: A | Discharge status: Home / Self Care | Payer: BC Managed Care – PPO | Source: Ambulatory Visit | Attending: Obstetrics | Admitting: Obstetrics

## 2013-03-31 DIAGNOSIS — O24919 Unspecified diabetes mellitus in pregnancy, unspecified trimester: Secondary | ICD-10-CM

## 2013-03-31 DIAGNOSIS — O24912 Unspecified diabetes mellitus in pregnancy, second trimester: Secondary | ICD-10-CM

## 2013-03-31 DIAGNOSIS — O0990 Supervision of high risk pregnancy, unspecified, unspecified trimester: Secondary | ICD-10-CM

## 2013-03-31 DIAGNOSIS — O209 Hemorrhage in early pregnancy, unspecified: Secondary | ICD-10-CM | POA: Insufficient documentation

## 2013-03-31 DIAGNOSIS — R8271 Bacteriuria: Secondary | ICD-10-CM

## 2013-03-31 DIAGNOSIS — O4692 Antepartum hemorrhage, unspecified, second trimester: Secondary | ICD-10-CM

## 2013-03-31 LAB — URINALYSIS, ROUTINE W REFLEX MICROSCOPIC
Glucose, UA: NEGATIVE mg/dL
Ketones, ur: NEGATIVE mg/dL
Leukocytes, UA: NEGATIVE
Nitrite: NEGATIVE
Protein, ur: NEGATIVE mg/dL

## 2013-03-31 NOTE — MAU Provider Note (Signed)
History     CSN: [id number]  Arrival date and time: 03/31/13 2003   First Provider Initiated Contact with Patient 03/31/13 2110      Chief Complaint  Patient presents with  . Vaginal Bleeding   HPI  Pt is G2P0010 at [redacted]w[redacted]d here with report of spotting of blood with wiping tonight around 1930.  No report of recent intercourse.  Denies any complications of pregnancy.  Reviewed ultrasound report of 03/26/13 - placenta above cervical os.  Denies cramping.    Past Medical History  Diagnosis Date  . Diabetes mellitus   . Gonorrhea   . PCOS (polycystic ovarian syndrome)   . Cushing syndrome   . Hirsutism   . Diabetes mellitus, antepartum     Past Surgical History  Procedure Laterality Date  . Abscess drainage    . Tooth extraction      Family History  Problem Relation Age of Onset  . COPD Maternal Grandmother   . Hypertension Maternal Grandmother   . Heart disease Maternal Grandmother   . Epilepsy Father   . Cancer Paternal Grandfather     History  Substance Use Topics  . Smoking status: Former Smoker    Quit date: 12/13/2012  . Smokeless tobacco: Never Used  . Alcohol Use: No    Allergies: No Known Allergies  Prescriptions prior to admission  Medication Sig Dispense Refill  . acetaminophen (TYLENOL) 500 MG tablet Take 500 mg by mouth every 6 (six) hours as needed for pain.      Marland Kitchen HYDROcodone-acetaminophen (NORCO/VICODIN) 5-325 MG per tablet Take 2 tablets by mouth every 4 (four) hours as needed for pain.  10 tablet  0  . insulin glargine (LANTUS) 100 UNIT/ML injection Inject 15 Units into the skin at bedtime.      . insulin lispro (HUMALOG KWIKPEN) 100 UNIT/ML SOPN Inject 3 Units into the skin 3 (three) times daily with meals.      . metFORMIN (GLUCOPHAGE) 500 MG tablet Take 500 mg by mouth 2 (two) times daily with a meal.      . penicillin v potassium (VEETID) 500 MG tablet Take 1 tablet (500 mg total) by mouth 3 (three) times daily.  15 tablet  0  . Prenat-Fe  Poly-Methfol-FA-DHA (VITAFOL ULTRA) 29-0.6-0.4-200 MG CAPS Take 1 capsule by mouth daily before breakfast.  90 capsule  3  . prenatal vitamin w/FE, FA (PRENATAL 1 + 1) 27-1 MG TABS tablet Take 1 tablet by mouth daily at 12 noon.  30 each  0    Review of Systems  Genitourinary:       Spotting of blood  All other systems reviewed and are negative.   Physical Exam   Blood pressure 124/89, pulse 97, temperature 98.1 F (36.7 C), resp. rate 18, height 5\' 6"  (1.676 m), weight 136.896 kg (301 lb 12.8 oz), last menstrual period 10/04/2012.  Physical Exam  Constitutional: She is oriented to person, place, and time. She appears well-developed and well-nourished. No distress.  HENT:  Head: Normocephalic.  Neck: Normal range of motion. Neck supple.  Cardiovascular: Normal rate, regular rhythm and normal heart sounds.   Respiratory: Effort normal and breath sounds normal.  GI: Soft. There is no tenderness.  Genitourinary: No bleeding around the vagina. Vaginal discharge (mucusy) found.  Neurological: She is alert and oriented to person, place, and time.  Skin: Skin is warm and dry.   FHR 150's, 10x10  MAU Course  Procedures Results for orders placed during the hospital encounter of  03/31/13 (from the past 24 hour(s))  URINALYSIS, ROUTINE W REFLEX MICROSCOPIC     Status: Abnormal   Collection Time    03/31/13  9:20 PM      Result Value Range   Color, Urine YELLOW  YELLOW   APPearance CLEAR  CLEAR   Specific Gravity, Urine >1.030 (*) 1.005 - 1.030   pH 6.0  5.0 - 8.0   Glucose, UA NEGATIVE  NEGATIVE mg/dL   Hgb urine dipstick NEGATIVE  NEGATIVE   Bilirubin Urine NEGATIVE  NEGATIVE   Ketones, ur NEGATIVE  NEGATIVE mg/dL   Protein, ur NEGATIVE  NEGATIVE mg/dL   Urobilinogen, UA 1.0  0.0 - 1.0 mg/dL   Nitrite NEGATIVE  NEGATIVE   Leukocytes, UA NEGATIVE  NEGATIVE   2205 Dr. Tamela Oddi notified > Reviewed HPI/Exam/NST/cervical exam/ultrasound report from 03/26/13 > discharge home with  follow-up in office next week.   Assessment and Plan  22 yo G2P0010 at [redacted]w[redacted]d with Bleeding in Pregnancy - Normal Exam  Plan: Discharge to home Bleeding precautions given. Schedule appointment in office next week.  [facility] 03/31/2013, 9:12 PM

## 2013-03-31 NOTE — MAU Note (Signed)
About 1930 saw bright red on tissue when wiped. None since. No pain. No intercourse in last 24hours.

## 2013-04-09 ENCOUNTER — Encounter: Payer: Self-pay | Admitting: Obstetrics

## 2013-04-09 ENCOUNTER — Ambulatory Visit (INDEPENDENT_AMBULATORY_CARE_PROVIDER_SITE_OTHER): Payer: Medicaid Other | Admitting: Obstetrics

## 2013-04-09 VITALS — BP 118/75 | Temp 98.9°F | Wt 300.0 lb

## 2013-04-09 DIAGNOSIS — Z3402 Encounter for supervision of normal first pregnancy, second trimester: Secondary | ICD-10-CM

## 2013-04-09 DIAGNOSIS — O0990 Supervision of high risk pregnancy, unspecified, unspecified trimester: Secondary | ICD-10-CM

## 2013-04-09 NOTE — Progress Notes (Signed)
P 97 Patient reports she had a little bleeding last week - she was seen at [facility] and reports she was normal.

## 2013-04-23 ENCOUNTER — Ambulatory Visit (HOSPITAL_COMMUNITY)
Admission: RE | Admit: 2013-04-23 | Discharge: 2013-04-23 | Disposition: A | Discharge status: Home / Self Care | Payer: BC Managed Care – PPO | Source: Ambulatory Visit | Attending: Obstetrics | Admitting: Obstetrics

## 2013-04-23 ENCOUNTER — Encounter: Payer: Self-pay | Admitting: Obstetrics

## 2013-04-23 ENCOUNTER — Ambulatory Visit (INDEPENDENT_AMBULATORY_CARE_PROVIDER_SITE_OTHER): Payer: Medicaid Other | Admitting: Obstetrics

## 2013-04-23 ENCOUNTER — Encounter: Payer: Medicaid Other | Admitting: Obstetrics

## 2013-04-23 VITALS — BP 133/76 | Temp 96.4°F | Wt 307.0 lb

## 2013-04-23 VITALS — BP 124/66 | HR 92 | Wt 306.0 lb

## 2013-04-23 DIAGNOSIS — O24919 Unspecified diabetes mellitus in pregnancy, unspecified trimester: Secondary | ICD-10-CM | POA: Insufficient documentation

## 2013-04-23 DIAGNOSIS — O24912 Unspecified diabetes mellitus in pregnancy, second trimester: Secondary | ICD-10-CM

## 2013-04-23 DIAGNOSIS — R8271 Bacteriuria: Secondary | ICD-10-CM

## 2013-04-23 DIAGNOSIS — E669 Obesity, unspecified: Secondary | ICD-10-CM | POA: Insufficient documentation

## 2013-04-23 DIAGNOSIS — Z3402 Encounter for supervision of normal first pregnancy, second trimester: Secondary | ICD-10-CM

## 2013-04-23 DIAGNOSIS — Z3400 Encounter for supervision of normal first pregnancy, unspecified trimester: Secondary | ICD-10-CM

## 2013-04-23 DIAGNOSIS — O0990 Supervision of high risk pregnancy, unspecified, unspecified trimester: Secondary | ICD-10-CM

## 2013-04-23 NOTE — Progress Notes (Signed)
[name] [name]  was seen today for an ultrasound appointment.  See full report in [facility].  Impression: Single IUP at 26 5/7 weeks Pregestational diabetes - had normal fetal echo Normal interal growth (60th %tile) Normal amniotic fluid volume  Recommendations: Recommend follow-up ultrasound examination in 4 weeks for interval growth Recommend beginning 2x weekly NSTs with weekly AFIs at 32 weeks.  Alpha Gula, MD

## 2013-04-23 NOTE — Progress Notes (Signed)
Pulse: 92

## 2013-04-24 NOTE — Addendum Note (Signed)
Encounter addended by: Alessandra Bevels. Chase Picket, RN on: 04/24/2013  4:44 PM<BR>     Documentation filed: Charges VN, OB Prenatal Vitals, Episodes, Chief Complaint Section

## 2013-05-07 ENCOUNTER — Ambulatory Visit (INDEPENDENT_AMBULATORY_CARE_PROVIDER_SITE_OTHER): Payer: Medicaid Other | Admitting: Obstetrics

## 2013-05-07 VITALS — BP 122/68 | Temp 98.7°F | Wt 306.0 lb

## 2013-05-07 DIAGNOSIS — Z3400 Encounter for supervision of normal first pregnancy, unspecified trimester: Secondary | ICD-10-CM

## 2013-05-07 DIAGNOSIS — Z3403 Encounter for supervision of normal first pregnancy, third trimester: Secondary | ICD-10-CM

## 2013-05-07 LAB — POCT URINALYSIS DIPSTICK
Blood, UA: NEGATIVE
Leukocytes, UA: NEGATIVE
Nitrite, UA: NEGATIVE
Protein, UA: NEGATIVE
Urobilinogen, UA: NEGATIVE
pH, UA: 6

## 2013-05-07 NOTE — Progress Notes (Signed)
Pulse- 94 

## 2013-05-08 ENCOUNTER — Encounter (HOSPITAL_COMMUNITY): Payer: Self-pay | Admitting: *Deleted

## 2013-05-08 ENCOUNTER — Encounter: Payer: Self-pay | Admitting: Obstetrics

## 2013-05-08 ENCOUNTER — Inpatient Hospital Stay (HOSPITAL_COMMUNITY)
Admission: AD | Admit: 2013-05-08 | Discharge: 2013-05-09 | Disposition: A | Discharge status: Home / Self Care | Payer: BC Managed Care – PPO | Source: Ambulatory Visit | Attending: Obstetrics | Admitting: Obstetrics

## 2013-05-08 DIAGNOSIS — E1165 Type 2 diabetes mellitus with hyperglycemia: Secondary | ICD-10-CM | POA: Insufficient documentation

## 2013-05-08 DIAGNOSIS — O24919 Unspecified diabetes mellitus in pregnancy, unspecified trimester: Secondary | ICD-10-CM | POA: Insufficient documentation

## 2013-05-08 DIAGNOSIS — R109 Unspecified abdominal pain: Secondary | ICD-10-CM | POA: Insufficient documentation

## 2013-05-08 DIAGNOSIS — IMO0002 Reserved for concepts with insufficient information to code with codable children: Secondary | ICD-10-CM | POA: Insufficient documentation

## 2013-05-08 DIAGNOSIS — O24113 Pre-existing diabetes mellitus, type 2, in pregnancy, third trimester: Secondary | ICD-10-CM

## 2013-05-08 DIAGNOSIS — O99891 Other specified diseases and conditions complicating pregnancy: Secondary | ICD-10-CM | POA: Insufficient documentation

## 2013-05-08 DIAGNOSIS — O26899 Other specified pregnancy related conditions, unspecified trimester: Secondary | ICD-10-CM

## 2013-05-08 LAB — WET PREP, GENITAL: Clue Cells Wet Prep HPF POC: NONE SEEN

## 2013-05-08 LAB — URINE MICROSCOPIC-ADD ON

## 2013-05-08 LAB — URINALYSIS, ROUTINE W REFLEX MICROSCOPIC
Bilirubin Urine: NEGATIVE
Hgb urine dipstick: NEGATIVE
Ketones, ur: 15 mg/dL — AB
Nitrite: NEGATIVE
Protein, ur: NEGATIVE mg/dL
Urobilinogen, UA: 1 mg/dL (ref 0.0–1.0)

## 2013-05-08 NOTE — MAU Note (Signed)
Pt states she started having abdominal pain earlier when she was at work about 1500

## 2013-05-08 NOTE — MAU Provider Note (Signed)
Chief Complaint:  Abdominal Pain   First Provider Initiated Contact with Patient 05/08/13 2147     HPI: [name] [name] is a 22 y.o. G2P0010 at [redacted]w[redacted]d who presents to maternity admissions reporting low abd pain since this afternoon at work. Unsure if she is having contractions. Denies leakage of fluid or vaginal bleeding. Good fetal movement.   Pregnancy Course: Complicated by type 2 diabetes.  Past Medical History: Past Medical History  Diagnosis Date  . Diabetes mellitus   . Gonorrhea   . PCOS (polycystic ovarian syndrome)   . Cushing syndrome   . Hirsutism   . Diabetes mellitus, antepartum     Past obstetric history: OB History  Gravida Para Term Preterm AB SAB TAB Ectopic Multiple Living  2 0 0 0 1 1 0 0 0 0     # Outcome Date GA Lbr Len/2nd Weight Sex Delivery Anes PTL Lv  2 CUR           1 SAB 09/2012 [redacted]w[redacted]d            Comments: System Generated. Please review and update pregnancy details.      Past Surgical History: Past Surgical History  Procedure Laterality Date  . Abscess drainage    . Tooth extraction       Family History: Family History  Problem Relation Age of Onset  . COPD Maternal Grandmother   . Hypertension Maternal Grandmother   . Heart disease Maternal Grandmother   . Epilepsy Father   . Cancer Paternal Grandfather     Social History: History  Substance Use Topics  . Smoking status: Former Smoker    Quit date: 12/13/2012  . Smokeless tobacco: Never Used  . Alcohol Use: No    Allergies: No Known Allergies  Meds:  No prescriptions prior to admission    ROS: Pertinent findings in history of present illness.  Physical Exam  Blood pressure 122/69, pulse 84, temperature 97.9 F (36.6 C), temperature source Oral, resp. rate 18, last menstrual period 10/04/2012. GENERAL: Well-developed, well-nourished female in no acute distress.  HEENT: normocephalic HEART: normal rate RESP: normal effort ABDOMEN: Soft, mild, bilateral groin tenderness,  gravid appropriate for gestational age. No CVA tenderness. EXTREMITIES: Nontender, no edema NEURO: alert and oriented SPECULUM EXAM: NEFG except for nontender 2 mm ulcerated lesion on the right labia majora and what appears to be folliculitis in various stages of healing on bilateral labia majora and mons. No vesicular lesions seen. physiologic discharge, no blood, cervix clean. Dilation: Closed Effacement (%): Thick Cervical Position: Posterior Presentation: Undeterminable Exam by:: Dorathy Kinsman CNM  FHT:  Baseline 140 , moderate variability, accelerations present, no decelerations Contractions: UI   Labs: Results for orders placed during the hospital encounter of 05/08/13 (from the past 24 hour(s))  URINALYSIS, ROUTINE W REFLEX MICROSCOPIC     Status: Abnormal   Collection Time    05/08/13  9:35 PM      Result Value Range   Color, Urine YELLOW  YELLOW   APPearance CLEAR  CLEAR   Specific Gravity, Urine 1.025  1.005 - 1.030   pH 6.0  5.0 - 8.0   Glucose, UA >1000 (*) NEGATIVE mg/dL   Hgb urine dipstick NEGATIVE  NEGATIVE   Bilirubin Urine NEGATIVE  NEGATIVE   Ketones, ur 15 (*) NEGATIVE mg/dL   Protein, ur NEGATIVE  NEGATIVE mg/dL   Urobilinogen, UA 1.0  0.0 - 1.0 mg/dL   Nitrite NEGATIVE  NEGATIVE   Leukocytes, UA NEGATIVE  NEGATIVE  URINE MICROSCOPIC-ADD ON     Status: Abnormal   Collection Time    05/08/13  9:35 PM      Result Value Range   Squamous Epithelial / LPF RARE  RARE   WBC, UA 0-2  <3 WBC/hpf   RBC / HPF 3-6  <3 RBC/hpf   Bacteria, UA FEW (*) RARE  WET PREP, GENITAL     Status: Abnormal   Collection Time    05/08/13  9:55 PM      Result Value Range   Yeast Wet Prep HPF POC NONE SEEN  NONE SEEN   Trich, Wet Prep NONE SEEN  NONE SEEN   Clue Cells Wet Prep HPF POC NONE SEEN  NONE SEEN   WBC, Wet Prep HPF POC FEW (*) NONE SEEN  GLUCOSE, CAPILLARY     Status: Abnormal   Collection Time    05/08/13 11:49 PM      Result Value Range   Glucose-Capillary 267  (*) 70 - 99 mg/dL  CBC     Status: Abnormal   Collection Time    05/09/13 12:15 AM      Result Value Range   WBC 9.6  4.0 - 10.5 K/uL   RBC 3.39 (*) 3.87 - 5.11 MIL/uL   Hemoglobin 10.4 (*) 12.0 - 15.0 g/dL   HCT 62.1 (*) 30.8 - 65.7 %   MCV 88.5  78.0 - 100.0 fL   MCH 30.7  26.0 - 34.0 pg   MCHC 34.7  30.0 - 36.0 g/dL   RDW 84.6  96.2 - 95.2 %   Platelets 205  150 - 400 K/uL  COMPREHENSIVE METABOLIC PANEL     Status: Abnormal   Collection Time    05/09/13 12:15 AM      Result Value Range   Sodium 133 (*) 135 - 145 mEq/L   Potassium 3.8  3.5 - 5.1 mEq/L   Chloride 100  96 - 112 mEq/L   CO2 23  19 - 32 mEq/L   Glucose, Bld 276 (*) 70 - 99 mg/dL   BUN 9  6 - 23 mg/dL   Creatinine, Ser 8.41  0.50 - 1.10 mg/dL   Calcium 9.6  8.4 - 32.4 mg/dL   Total Protein 6.9  6.0 - 8.3 g/dL   Albumin 3.0 (*) 3.5 - 5.2 g/dL   AST 19  0 - 37 U/L   ALT 45 (*) 0 - 35 U/L   Alkaline Phosphatase 89  39 - 117 U/L   Total Bilirubin 0.3  0.3 - 1.2 mg/dL   GFR calc non Af Amer >90  >90 mL/min   GFR calc Af Amer >90  >90 mL/min  GLUCOSE, CAPILLARY     Status: Abnormal   Collection Time    05/09/13  3:37 AM      Result Value Range   Glucose-Capillary 182 (*) 70 - 99 mg/dL   Comment 1 Notify RN      Imaging:  N/A  MAU Course: Discussed presenting symptom of low abdominal pain with Dr. Clearance Coots. Likely round ligament pain versus uterine irritability. No further intervention needed. Comfort measures. Also notified Dr. Clearance Coots of glycosuria, ketonuria and hyperglycemia. Patient initially reported that she was taking insulin and metformin as prescribed by Dr. Horald Pollen, but then admitted that she had not taken any diabetes medications for over 2 months because she stated that someone from maternal fetal medicine had questioned the safety of them. Patient states she did not discuss her concerns about  these medications with her obstetrician or endocrinologist. MFM notes reviewed. No instructions to change diabetes  medications. Medication management deferred to Dr. Romero Belling.  CBC, CMET, IV fluids and Humalog 5 units ordered per Dr. Clearance Coots. Dr. Clearance Coots recommends that patient be referred to high-risk clinic. Patient agrees. Appointment requested. Instructed patient to resume previous medication regimen. May discontinue Lantus for now if she is concerned about the safety.  Assessment: 1. Pain of round ligament complicating pregnancy, antepartum   2. Pregnancy complicated by pre-existing type 2 diabetes in third trimester   3. Uncontrolled diabetes mellitus without complication    Plan: Discharge home in stable condition. Preterm labor precautions and fetal kick counts. Comfort measures, maternity belt. Herpes culture, urine culture and GC Chlamydia cultures pending. Follow-up Information   Follow up with [facility]. (will call you to schedule appointment as soon as possible)    Specialty:  Obstetrics and Gynecology   Contact information:   [address] [place] [address] [phone]      Follow up with [facility] [place] MATERNITY ADMISSIONS. (As needed in emergencies)    Contact information:   [address] [id number] [place] [address] [phone]    Lengthy discussion with patient about the dangers to mother and baby of uncontrolled diabetes including increased risk of stillbirth, shoulder dystocia and delayed lung maturity. Strongly recommend the patient is here to medication recommendations. Patient verbalizes understanding.     Follow-up Information   Follow up with [facility]. (will call you to schedule appointment as soon as possible)    Specialty:  Obstetrics and Gynecology   Contact information:   [address] [place] [address] [phone]      Follow up with [facility] [place] MATERNITY ADMISSIONS. (As needed in emergencies)    Contact information:   [address] [id number] [place] [address] [phone]        Medication List         insulin glargine 100 UNIT/ML injection  Commonly known as:  LANTUS  Inject 15 Units into the skin at bedtime.     insulin lispro 100 UNIT/ML Sopn  Commonly known as:  HUMALOG KWIKPEN  Inject 3 Units into the skin 3 (three) times daily with meals.     metFORMIN 500 MG tablet  Commonly known as:  GLUCOPHAGE  Take 1 tablet (500 mg total) by mouth 2 (two) times daily with a meal.     prenatal multivitamin Tabs tablet  Take 1 tablet by mouth daily at 12 noon.        [place], CNM 05/09/2013 5:16 AM

## 2013-05-09 DIAGNOSIS — N949 Unspecified condition associated with female genital organs and menstrual cycle: Secondary | ICD-10-CM

## 2013-05-09 DIAGNOSIS — O9989 Other specified diseases and conditions complicating pregnancy, childbirth and the puerperium: Secondary | ICD-10-CM

## 2013-05-09 LAB — COMPREHENSIVE METABOLIC PANEL
ALT: 45 U/L — ABNORMAL HIGH (ref 0–35)
AST: 19 U/L (ref 0–37)
Albumin: 3 g/dL — ABNORMAL LOW (ref 3.5–5.2)
Alkaline Phosphatase: 89 U/L (ref 39–117)
BUN: 9 mg/dL (ref 6–23)
Calcium: 9.6 mg/dL (ref 8.4–10.5)
Chloride: 100 mEq/L (ref 96–112)
GFR calc Af Amer: >90 mL/min (ref >90)
Glucose, Bld: 276 mg/dL — ABNORMAL HIGH (ref 70–99)
Potassium: 3.8 mEq/L (ref 3.5–5.1)
Sodium: 133 mEq/L — ABNORMAL LOW (ref 135–145)
Total Bilirubin: 0.3 mg/dL (ref 0.3–1.2)
Total Protein: 6.9 g/dL (ref 6.0–8.3)

## 2013-05-09 LAB — CBC
HCT: 30 % — ABNORMAL LOW (ref 36.0–46.0)
Hemoglobin: 10.4 g/dL — ABNORMAL LOW (ref 12.0–15.0)
MCH: 30.7 pg (ref 26.0–34.0)
RDW: 13.3 % (ref 11.5–15.5)
WBC: 9.6 10*3/uL (ref 4.0–10.5)

## 2013-05-09 LAB — GLUCOSE, CAPILLARY

## 2013-05-09 LAB — GC/CHLAMYDIA PROBE AMP
CT Probe RNA: NEGATIVE
GC Probe RNA: NEGATIVE

## 2013-05-09 LAB — HEMOGLOBIN A1C: Hgb A1c MFr Bld: 5.9 % — ABNORMAL HIGH (ref <5.7)

## 2013-05-09 MED ORDER — METFORMIN HCL 500 MG PO TABS: 500.0000 mg | ORAL_TABLET | Freq: Two times a day (BID) | ORAL | Status: DC

## 2013-05-09 MED ORDER — IBUPROFEN 600 MG PO TABS: 600.0000 mg | ORAL_TABLET | Freq: Once | ORAL | Status: DC

## 2013-05-09 MED ORDER — INSULIN ASPART 100 UNIT/ML ~~LOC~~ SOLN
5.0000 [IU] | Freq: Once | SUBCUTANEOUS | Status: AC
  Administered 2013-05-09: 5 [IU] via SUBCUTANEOUS
  Filled 2013-05-09: qty 1

## 2013-05-09 MED ORDER — INSULIN LISPRO 100 UNIT/ML (KWIKPEN): 3.0000 [IU] | PEN_INJECTOR | Freq: Three times a day (TID) | SUBCUTANEOUS | Status: DC

## 2013-05-09 MED ORDER — LACTATED RINGERS IV BOLUS (SEPSIS)
1000.0000 mL | Freq: Once | INTRAVENOUS | Status: AC
  Administered 2013-05-09 (×2): 1000 mL via INTRAVENOUS

## 2013-05-09 MED ORDER — LACTATED RINGERS IV BOLUS (SEPSIS)
1000.0000 mL | Freq: Once | INTRAVENOUS | Status: AC
  Administered 2013-05-09: 1000 mL via INTRAVENOUS

## 2013-05-10 LAB — HERPES SIMPLEX VIRUS CULTURE: Culture: NOT DETECTED

## 2013-05-21 ENCOUNTER — Encounter: Payer: Self-pay | Admitting: Obstetrics and Gynecology

## 2013-05-21 ENCOUNTER — Encounter (HOSPITAL_COMMUNITY): Payer: Self-pay

## 2013-05-21 ENCOUNTER — Ambulatory Visit (INDEPENDENT_AMBULATORY_CARE_PROVIDER_SITE_OTHER): Payer: BC Managed Care – PPO | Admitting: Obstetrics and Gynecology

## 2013-05-21 ENCOUNTER — Ambulatory Visit (HOSPITAL_COMMUNITY)
Admission: RE | Admit: 2013-05-21 | Discharge: 2013-05-21 | Disposition: A | Discharge status: Home / Self Care | Payer: BC Managed Care – PPO | Source: Ambulatory Visit | Attending: Obstetrics and Gynecology | Admitting: Obstetrics and Gynecology

## 2013-05-21 VITALS — BP 117/72 | Temp 98.1°F | Wt 300.6 lb

## 2013-05-21 VITALS — BP 132/75 | HR 98 | Wt 300.0 lb

## 2013-05-21 DIAGNOSIS — E669 Obesity, unspecified: Secondary | ICD-10-CM | POA: Insufficient documentation

## 2013-05-21 DIAGNOSIS — O24919 Unspecified diabetes mellitus in pregnancy, unspecified trimester: Secondary | ICD-10-CM

## 2013-05-21 DIAGNOSIS — O0990 Supervision of high risk pregnancy, unspecified, unspecified trimester: Secondary | ICD-10-CM

## 2013-05-21 DIAGNOSIS — E119 Type 2 diabetes mellitus without complications: Secondary | ICD-10-CM

## 2013-05-21 DIAGNOSIS — R8271 Bacteriuria: Secondary | ICD-10-CM

## 2013-05-21 DIAGNOSIS — O99213 Obesity complicating pregnancy, third trimester: Secondary | ICD-10-CM

## 2013-05-21 DIAGNOSIS — O24912 Unspecified diabetes mellitus in pregnancy, second trimester: Secondary | ICD-10-CM

## 2013-05-21 LAB — POCT URINALYSIS DIP (DEVICE)
Bilirubin Urine: NEGATIVE
Ketones, ur: NEGATIVE mg/dL
Protein, ur: NEGATIVE mg/dL
Specific Gravity, Urine: 1.025 (ref 1.005–1.030)

## 2013-05-21 LAB — CBC
HCT: 30.3 % — ABNORMAL LOW (ref 36.0–46.0)
Hemoglobin: 10.6 g/dL — ABNORMAL LOW (ref 12.0–15.0)
MCH: 30.6 pg (ref 26.0–34.0)
MCV: 87.6 fL (ref 78.0–100.0)
RDW: 13.8 % (ref 11.5–15.5)
WBC: 7.7 10*3/uL (ref 4.0–10.5)

## 2013-05-21 NOTE — Patient Instructions (Signed)

## 2013-05-21 NOTE — Progress Notes (Signed)
Type 2 Dm diagnosed 1 yr ago. Was seen by Drs. Ballen and Clearance Coots this pregnancy but is no longer having DM managed by Dr. Lurene Shadow. No log book. States she checks CBGs before each meal and thay are all <100. Taking Metformin 1000mg  q hs and Humalog 3 u with each meal. Stopped the Lantus. Unable to stay to see DM Educator today due to [place] appointment, however will return after [place] if Educator still here. Discussed need to record CBGs fasting and 2 hr PP meals. C/W Dr. Macon Large If DM Educator not here after the [place] add on to see Dr. Jolayne Panther this afternoon to decide on med regimen.   Did not return to clinic. Irven Coe will call to schedule her for this week.

## 2013-05-21 NOTE — Progress Notes (Signed)
P= 96 Pt. C/o of intermittent lower abdominal pressure.

## 2013-05-22 ENCOUNTER — Encounter: Payer: BC Managed Care – PPO | Admitting: Obstetrics

## 2013-05-22 LAB — PRESCRIPTION MONITORING PROFILE (19 PANEL)
Amphetamine/Meth: NEGATIVE ng/mL
Barbiturate Screen, Urine: NEGATIVE ng/mL
Benzodiazepine Screen, Urine: NEGATIVE ng/mL
Buprenorphine, Urine: NEGATIVE ng/mL
Cannabinoid Scrn, Ur: NEGATIVE ng/mL
Carisoprodol, Urine: NEGATIVE ng/mL
Cocaine Metabolites: NEGATIVE ng/mL
Creatinine, Urine: 100.29 mg/dL
Fentanyl, Ur: NEGATIVE ng/mL
MDMA URINE: NEGATIVE ng/mL
Meperidine, Ur: NEGATIVE ng/mL
Methadone Screen, Urine: NEGATIVE ng/mL
Methaqualone: NEGATIVE ng/mL
Nitrites, Initial: NEGATIVE ug/mL
Opiate Screen, Urine: NEGATIVE ng/mL
Oxycodone Screen, Ur: NEGATIVE ng/mL
Phencyclidine, Ur: NEGATIVE ng/mL
Propoxyphene: NEGATIVE ng/mL
Tapentadol, urine: NEGATIVE ng/mL
Tramadol Scrn, Ur: NEGATIVE ng/mL
Zolpidem, Urine: NEGATIVE ng/mL
pH, Initial: 6.9 pH (ref 4.5–8.9)

## 2013-05-22 LAB — ALCOHOL METABOLITE (ETG), URINE: Ethyl Glucuronide (EtG): NEGATIVE ng/mL

## 2013-05-28 ENCOUNTER — Ambulatory Visit (INDEPENDENT_AMBULATORY_CARE_PROVIDER_SITE_OTHER): Payer: BC Managed Care – PPO | Admitting: Advanced Practice Midwife

## 2013-05-28 VITALS — BP 128/85 | Temp 97.1°F | Wt 298.7 lb

## 2013-05-28 DIAGNOSIS — O24919 Unspecified diabetes mellitus in pregnancy, unspecified trimester: Secondary | ICD-10-CM

## 2013-05-28 DIAGNOSIS — O24913 Unspecified diabetes mellitus in pregnancy, third trimester: Secondary | ICD-10-CM

## 2013-05-28 LAB — POCT URINALYSIS DIP (DEVICE)
Bilirubin Urine: NEGATIVE
Glucose, UA: 100 mg/dL — AB
Protein, ur: 30 mg/dL — AB
Specific Gravity, Urine: 1.025 (ref 1.005–1.030)
pH: 6 (ref 5.0–8.0)

## 2013-05-28 MED ORDER — INSULIN NPH (HUMAN) (ISOPHANE) 100 UNIT/ML ~~LOC~~ SUSP: 10.0000 [IU] | Freq: Every day | SUBCUTANEOUS | Status: DC

## 2013-05-28 MED ORDER — INSULIN REGULAR HUMAN 100 UNIT/ML IJ SOLN: 10.0000 [IU] | Freq: Every day | INTRAMUSCULAR | Status: DC

## 2013-05-28 MED ORDER — INSULIN REGULAR HUMAN 100 UNIT/ML IJ SOLN: 15.0000 [IU] | Freq: Every day | INTRAMUSCULAR | Status: DC

## 2013-05-28 MED ORDER — INSULIN NPH (HUMAN) (ISOPHANE) 100 UNIT/ML ~~LOC~~ SUSP: 35.0000 [IU] | Freq: Every day | SUBCUTANEOUS | Status: DC

## 2013-05-28 NOTE — Progress Notes (Signed)
Doing well.  Good fetal movement, denies vaginal bleeding, LOF, regular contractions.  No BS Log but pt reports fastings in 200s and some PP in 200s as well.  Reviewed with diabetic educator and with Dr Erin Fulling.  Pt is to make the following changes:  Stop taking Metformin Stop taking Lispro insulin Start taking NPH 35 units and 15 units Regular insulin before breakfast Take no insulin with lunch Take 10 units Regular insulin with supper Take 10 units NPH at bedtime Pt MUST bring glucose log to her appointment on Monday in [facility].

## 2013-05-28 NOTE — Progress Notes (Signed)
Pulse- 113 Patient reports pelvic pressure

## 2013-05-29 ENCOUNTER — Telehealth: Payer: Self-pay

## 2013-05-29 MED ORDER — "INSULIN SYRINGE-NEEDLE U-100 30G X 3/8"" 1 ML MISC": 1.0000 | Freq: Three times a day (TID) | Status: DC

## 2013-05-29 NOTE — Telephone Encounter (Signed)
Called pt. Who stated she does not have needles to draw up her new regular and NPH insulin. Informed pt. I have ordered them and she will be able to pick them up at [facility]. Discussed with pt. The new changes to her insulin regimen. Pt. Verbalized understanding. Checked understanding by having pt. Read back to me the instructions. Pt. Stated new instructions perfectly. Informed pt. She must bring glucose log to her appointment on Monday. Pt. Verbalized understanding and had no other questions or concerns.

## 2013-05-29 NOTE — Telephone Encounter (Signed)
Pt. Called stating that [name] [name] was supposed to have ordered needles for her insulin for her but had not. Called pt. And left message stating we are returning her call and she can call the clinic back.   [name] [name] also left a staff message asking to inform pt. Of these changes with her insulin: Pt is to make the following changes:  Stop taking Metformin  Stop taking Lispro insulin  Start taking NPH 35 units and 15 units Regular insulin before breakfast  Take no insulin with lunch  Take 10 units Regular insulin with supper  Take 10 units NPH at bedtime  Pt MUST bring glucose log to her appointment on Monday in [facility].

## 2013-06-01 ENCOUNTER — Inpatient Hospital Stay (HOSPITAL_COMMUNITY): Admission: AD | Admit: 2013-06-01 | Payer: BC Managed Care – PPO

## 2013-06-01 ENCOUNTER — Inpatient Hospital Stay (HOSPITAL_COMMUNITY): Payer: BC Managed Care – PPO

## 2013-06-01 ENCOUNTER — Inpatient Hospital Stay (HOSPITAL_COMMUNITY)
Admission: AD | Admit: 2013-06-01 | Discharge: 2013-06-01 | Disposition: A | Discharge status: Home / Self Care | Payer: BC Managed Care – PPO | Source: Ambulatory Visit | Attending: Obstetrics and Gynecology | Admitting: Obstetrics and Gynecology

## 2013-06-01 ENCOUNTER — Encounter (HOSPITAL_COMMUNITY): Payer: Self-pay | Admitting: *Deleted

## 2013-06-01 DIAGNOSIS — O368390 Maternal care for abnormalities of the fetal heart rate or rhythm, unspecified trimester, not applicable or unspecified: Secondary | ICD-10-CM | POA: Insufficient documentation

## 2013-06-01 DIAGNOSIS — Z87891 Personal history of nicotine dependence: Secondary | ICD-10-CM | POA: Insufficient documentation

## 2013-06-01 DIAGNOSIS — O212 Late vomiting of pregnancy: Secondary | ICD-10-CM | POA: Insufficient documentation

## 2013-06-01 DIAGNOSIS — O99891 Other specified diseases and conditions complicating pregnancy: Secondary | ICD-10-CM | POA: Insufficient documentation

## 2013-06-01 DIAGNOSIS — O289 Unspecified abnormal findings on antenatal screening of mother: Secondary | ICD-10-CM

## 2013-06-01 DIAGNOSIS — R630 Anorexia: Secondary | ICD-10-CM | POA: Insufficient documentation

## 2013-06-01 DIAGNOSIS — K529 Noninfective gastroenteritis and colitis, unspecified: Secondary | ICD-10-CM

## 2013-06-01 DIAGNOSIS — R109 Unspecified abdominal pain: Secondary | ICD-10-CM | POA: Insufficient documentation

## 2013-06-01 LAB — WET PREP, GENITAL
Clue Cells Wet Prep HPF POC: NONE SEEN
Trich, Wet Prep: NONE SEEN
Yeast Wet Prep HPF POC: NONE SEEN

## 2013-06-01 LAB — URINALYSIS, ROUTINE W REFLEX MICROSCOPIC
Bilirubin Urine: NEGATIVE
Glucose, UA: 500 mg/dL — AB
Ketones, ur: NEGATIVE mg/dL
Nitrite: NEGATIVE
Protein, ur: NEGATIVE mg/dL
pH: 6.5 (ref 5.0–8.0)

## 2013-06-01 LAB — GLUCOSE, CAPILLARY: Glucose-Capillary: 117 mg/dL — ABNORMAL HIGH (ref 70–99)

## 2013-06-01 LAB — URINE MICROSCOPIC-ADD ON

## 2013-06-01 MED ORDER — ONDANSETRON 4 MG PO TBDP: 4.0000 mg | ORAL_TABLET | Freq: Three times a day (TID) | ORAL | Status: DC | PRN

## 2013-06-01 MED ORDER — ONDANSETRON 4 MG PO TBDP
4.0000 mg | ORAL_TABLET | Freq: Once | ORAL | Status: AC
  Administered 2013-06-01: 4 mg via ORAL
  Filled 2013-06-01: qty 1

## 2013-06-01 NOTE — MAU Note (Signed)
Cramping in upper am since 1100. Went home and took nap and was hurting off and on. When woke up hurt worse and was throwing up really bad. Have vomited x 2. No diarrhea

## 2013-06-01 NOTE — MAU Provider Note (Signed)
History     CSN: [id number]  Arrival date and time: 06/01/13 1957   None     Chief Complaint  Patient presents with  . Abdominal Cramping  . Emesis During Pregnancy   HPI [name] [name] is a 22 y.o. G2P0010 at [redacted]w[redacted]d presents with complaints of upper abdominal pain that occurs every 30 minutes since noon. Pt reports the last 2 have been associated with emesis. Pt has not had any sick exposures or change in regular diet. Pt reports decreased appetite and not taking her insulin today.   +FM no LOF, no VB, ?Ctx  +HA, no vision changes, SOB, d/c, urinary symptoms, c/d, weakness or other complaints.   OB History   Grav Para Term Preterm Abortions TAB SAB Ect Mult Living   2 0 0 0 1 0 1 0 0 0       Past Medical History  Diagnosis Date  . Diabetes mellitus   . Gonorrhea   . PCOS (polycystic ovarian syndrome)   . Cushing syndrome   . Hirsutism   . Diabetes mellitus, antepartum     Past Surgical History  Procedure Laterality Date  . Abscess drainage    . Tooth extraction      Family History  Problem Relation Age of Onset  . COPD Maternal Grandmother   . Hypertension Maternal Grandmother   . Heart disease Maternal Grandmother   . Epilepsy Father   . Cancer Paternal Grandfather     History  Substance Use Topics  . Smoking status: Former Smoker    Quit date: 12/13/2012  . Smokeless tobacco: Never Used  . Alcohol Use: No    Allergies: No Known Allergies  Prescriptions prior to admission  Medication Sig Dispense Refill  . insulin NPH (HUMULIN N,NOVOLIN N) 100 UNIT/ML injection Inject 35 Units into the skin daily before breakfast.  10 mL  3  . insulin NPH (HUMULIN N,NOVOLIN N) 100 UNIT/ML injection Inject 10 Units into the skin at bedtime.  10 mL  3  . insulin regular (HUMULIN R) 100 units/mL injection Inject 0.15 mLs (15 Units total) into the skin daily before breakfast.  10 mL  12  . insulin regular (HUMULIN R) 100 units/mL injection Inject 0.1 mLs (10 Units  total) into the skin daily before supper.  10 mL  12  . Prenatal Vit-Fe Fumarate-FA (PRENATAL MULTIVITAMIN) TABS tablet Take 1 tablet by mouth daily at 12 noon.      . Insulin Syringe-Needle U-100 30G X 3/8" 1 ML MISC Inject 1 Syringe into the skin 3 (three) times daily.  100 each  3    ROS Physical Exam   Blood pressure 123/61, pulse 80, temperature 98 F (36.7 C), resp. rate 20, height 5\' 7"  (1.702 m), weight 136.714 kg (301 lb 6.4 oz), last menstrual period 10/04/2012.  Physical Exam  VSS NAD CTAB no wrc RRR no mgt Gravid NTTP No c/c/e SSE: mild white discharge SVE: Dilation: Fingertip Effacement (%): 10 Cervical Position: Posterior Station: -3 Exam by:: Dr. Ike Bene  FHT: 150s, mod var, mult 15x15 accels, 1 variable decel with a ctx Toco: 1 ctx  FSG 117  MAU Course  Procedures  MDM FSG. UA unremarkable other than 500 glucose in known diabetic and trace Hb, FFN negative. NST with a few questionable variables and one deep variable in relation to contraction. BBP 8/8 and symptoms otherwise improved with Zofran.  Assessment and Plan  [name] [name] is a 22 y.o. G2P0010 at [redacted]w[redacted]d presenting with abdominal  pain and associated emesis that improved with zofran. Pt had no emesis in MAU and FSG is <120. -NST with questionable variables as above, BPP 8/8 -plan discharge with Rx for Zofran and f/u in clinic at [facility] as needed -preterm labor precautions reviewed  Note and care started by Dr. Ike Bene, completed by me. Discussed with Dr. Emelda Fear.  Bobbye Morton, MD PGY-2, [facility] 06/01/2013, 11:12 PM

## 2013-06-02 NOTE — MAU Provider Note (Signed)
Attestation of Attending Supervision of Advanced Practitioner: Evaluation and management procedures were performed by the PA/NP/CNM/OB Fellow under my supervision/collaboration. Chart reviewed and agree with management and plan.  Merdith Boyd V 06/02/2013 10:05 PM

## 2013-06-03 LAB — URINE CULTURE

## 2013-06-04 ENCOUNTER — Inpatient Hospital Stay (HOSPITAL_COMMUNITY)
Admission: AD | Admit: 2013-06-04 | Discharge: 2013-06-06 | DRG: 781 | Disposition: A | Discharge status: Home / Self Care | Payer: BC Managed Care – PPO | Source: Ambulatory Visit | Attending: Obstetrics & Gynecology | Admitting: Obstetrics & Gynecology

## 2013-06-04 ENCOUNTER — Ambulatory Visit (INDEPENDENT_AMBULATORY_CARE_PROVIDER_SITE_OTHER): Payer: BC Managed Care – PPO | Admitting: Obstetrics & Gynecology

## 2013-06-04 ENCOUNTER — Encounter (HOSPITAL_COMMUNITY): Payer: Self-pay | Admitting: *Deleted

## 2013-06-04 VITALS — BP 118/74 | Temp 97.5°F | Wt 304.3 lb

## 2013-06-04 DIAGNOSIS — E669 Obesity, unspecified: Secondary | ICD-10-CM | POA: Diagnosis present

## 2013-06-04 DIAGNOSIS — Z23 Encounter for immunization: Secondary | ICD-10-CM

## 2013-06-04 DIAGNOSIS — E249 Cushing's syndrome, unspecified: Secondary | ICD-10-CM

## 2013-06-04 DIAGNOSIS — E1165 Type 2 diabetes mellitus with hyperglycemia: Secondary | ICD-10-CM | POA: Diagnosis present

## 2013-06-04 DIAGNOSIS — R8271 Bacteriuria: Secondary | ICD-10-CM

## 2013-06-04 DIAGNOSIS — O24919 Unspecified diabetes mellitus in pregnancy, unspecified trimester: Secondary | ICD-10-CM

## 2013-06-04 DIAGNOSIS — O99213 Obesity complicating pregnancy, third trimester: Secondary | ICD-10-CM | POA: Diagnosis present

## 2013-06-04 DIAGNOSIS — Z6841 Body Mass Index (BMI) 40.0 and over, adult: Secondary | ICD-10-CM

## 2013-06-04 DIAGNOSIS — O24913 Unspecified diabetes mellitus in pregnancy, third trimester: Secondary | ICD-10-CM

## 2013-06-04 DIAGNOSIS — Z87891 Personal history of nicotine dependence: Secondary | ICD-10-CM

## 2013-06-04 DIAGNOSIS — O0990 Supervision of high risk pregnancy, unspecified, unspecified trimester: Secondary | ICD-10-CM

## 2013-06-04 DIAGNOSIS — IMO0002 Reserved for concepts with insufficient information to code with codable children: Secondary | ICD-10-CM | POA: Diagnosis present

## 2013-06-04 DIAGNOSIS — E240 Pituitary-dependent Cushing's disease: Secondary | ICD-10-CM

## 2013-06-04 DIAGNOSIS — Z794 Long term (current) use of insulin: Secondary | ICD-10-CM

## 2013-06-04 DIAGNOSIS — O212 Late vomiting of pregnancy: Secondary | ICD-10-CM | POA: Diagnosis present

## 2013-06-04 DIAGNOSIS — O4700 False labor before 37 completed weeks of gestation, unspecified trimester: Secondary | ICD-10-CM | POA: Diagnosis present

## 2013-06-04 LAB — CBC
HCT: 31 % — ABNORMAL LOW (ref 36.0–46.0)
Hemoglobin: 10.5 g/dL — ABNORMAL LOW (ref 12.0–15.0)
MCH: 30.6 pg (ref 26.0–34.0)
MCHC: 33.9 g/dL (ref 30.0–36.0)
Platelets: 219 10*3/uL (ref 150–400)
RDW: 13.8 % (ref 11.5–15.5)
WBC: 7.8 10*3/uL (ref 4.0–10.5)

## 2013-06-04 LAB — GLUCOSE, CAPILLARY
Glucose-Capillary: 284 mg/dL — ABNORMAL HIGH (ref 70–99)
Glucose-Capillary: 226 mg/dL — ABNORMAL HIGH (ref 70–99)

## 2013-06-04 LAB — COMPREHENSIVE METABOLIC PANEL
AST: 37 U/L (ref 0–37)
Albumin: 2.8 g/dL — ABNORMAL LOW (ref 3.5–5.2)
Alkaline Phosphatase: 115 U/L (ref 39–117)
BUN: 8 mg/dL (ref 6–23)
CO2: 22 mEq/L (ref 19–32)
Chloride: 102 mEq/L (ref 96–112)
GFR calc Af Amer: >90 mL/min (ref >90)
Glucose, Bld: 215 mg/dL — ABNORMAL HIGH (ref 70–99)
Potassium: 4.1 mEq/L (ref 3.5–5.1)
Total Bilirubin: 0.4 mg/dL (ref 0.3–1.2)

## 2013-06-04 LAB — URINALYSIS, ROUTINE W REFLEX MICROSCOPIC
Bilirubin Urine: NEGATIVE
Glucose, UA: >1000 mg/dL — AB
Hgb urine dipstick: NEGATIVE
Specific Gravity, Urine: 1.02 (ref 1.005–1.030)
pH: 6 (ref 5.0–8.0)

## 2013-06-04 LAB — HEMOGLOBIN A1C: Hgb A1c MFr Bld: 6.4 % — ABNORMAL HIGH (ref <5.7)

## 2013-06-04 LAB — URINE MICROSCOPIC-ADD ON

## 2013-06-04 LAB — TYPE AND SCREEN

## 2013-06-04 MED ORDER — SODIUM CHLORIDE 0.9 % IJ SOLN
3.0000 mL | INTRAMUSCULAR | Status: DC | PRN
  Administered 2013-06-05: 3 mL via INTRAVENOUS

## 2013-06-04 MED ORDER — PRENATAL MULTIVITAMIN CH
1.0000 | ORAL_TABLET | Freq: Every day | ORAL | Status: DC
  Administered 2013-06-04 – 2013-06-06 (×3): 1 via ORAL
  Filled 2013-06-04 (×3): qty 1

## 2013-06-04 MED ORDER — ACETAMINOPHEN 325 MG PO TABS: 650.0000 mg | ORAL_TABLET | ORAL | Status: DC | PRN

## 2013-06-04 MED ORDER — ONDANSETRON 4 MG PO TBDP
4.0000 mg | ORAL_TABLET | Freq: Three times a day (TID) | ORAL | Status: DC | PRN
  Filled 2013-06-04: qty 1

## 2013-06-04 MED ORDER — SODIUM CHLORIDE 0.9 % IV SOLN: 250.0000 mL | INTRAVENOUS | Status: DC | PRN

## 2013-06-04 MED ORDER — CALCIUM CARBONATE ANTACID 500 MG PO CHEW: 2.0000 | CHEWABLE_TABLET | ORAL | Status: DC | PRN

## 2013-06-04 MED ORDER — INSULIN NPH (HUMAN) (ISOPHANE) 100 UNIT/ML ~~LOC~~ SUSP
10.0000 [IU] | Freq: Every day | SUBCUTANEOUS | Status: DC
  Administered 2013-06-04: 10 [IU] via SUBCUTANEOUS

## 2013-06-04 MED ORDER — INSULIN REGULAR HUMAN 100 UNIT/ML IJ SOLN
10.0000 [IU] | Freq: Every day | INTRAMUSCULAR | Status: DC
  Administered 2013-06-04: 10 [IU] via SUBCUTANEOUS
  Filled 2013-06-04 (×9): qty 0.1

## 2013-06-04 MED ORDER — INSULIN NPH (HUMAN) (ISOPHANE) 100 UNIT/ML ~~LOC~~ SUSP
35.0000 [IU] | Freq: Every day | SUBCUTANEOUS | Status: DC
  Filled 2013-06-04: qty 10

## 2013-06-04 MED ORDER — INSULIN REGULAR HUMAN 100 UNIT/ML IJ SOLN: 15.0000 [IU] | Freq: Every day | INTRAMUSCULAR | Status: DC

## 2013-06-04 MED ORDER — SODIUM CHLORIDE 0.9 % IJ SOLN
3.0000 mL | Freq: Two times a day (BID) | INTRAMUSCULAR | Status: DC
  Administered 2013-06-04 – 2013-06-06 (×4): 3 mL via INTRAVENOUS

## 2013-06-04 MED ORDER — ZOLPIDEM TARTRATE 5 MG PO TABS: 5.0000 mg | ORAL_TABLET | Freq: Every evening | ORAL | Status: DC | PRN

## 2013-06-04 MED ORDER — TETANUS-DIPHTH-ACELL PERTUSSIS 5-2.5-18.5 LF-MCG/0.5 IM SUSP: 0.5000 mL | Freq: Once | INTRAMUSCULAR | Status: DC

## 2013-06-04 MED ORDER — DOCUSATE SODIUM 100 MG PO CAPS
100.0000 mg | ORAL_CAPSULE | Freq: Every day | ORAL | Status: DC
  Administered 2013-06-04: 100 mg via ORAL
  Filled 2013-06-04 (×2): qty 1

## 2013-06-04 NOTE — Progress Notes (Signed)
Inpatient Diabetes Program Recommendations  AACE/ADA: New Consensus Statement on Inpatient Glycemic Control (2013)  Target Ranges:  Prepandial:   less than 140 mg/dL      Peak postprandial:   less than 180 mg/dL (1-2 hours)      Critically ill patients:  140 - 180 mg/dL   Results for BREYA, CASS (MRN [id number]) as of 06/04/2013 12:07  Ref. Range 06/04/2013 10:43  Glucose-Capillary Latest Range: 70-99 mg/dL 562 (H)    Reason for Visit: Consult  Inpatient Diabetes Program Recommendations Correction (SSI): Please consider ordering Diabetic Pregnant Glycemic control order set for blood glucose correction with Novolog.  Note: Patient has a history of diabetes (according to the chart, diagnosed about 1 year ago) and is prescribed NPH 35 units every morning before breakfast, NPH 10 units at bedtime, Humulin R 15 units before breakfast, and Humulin R 10 units before supper for outpatient management of diabetes.  In reviewing the chart, on 05/09/13 discharge note by Dorathy Kinsman, CNM patient admitted she had not been taking medications for diabetes due to safety concerns and patient was referred to the high risk clinic.  According to note by Sharen Counter, CNM on 05/28/13 diabetes medications were prescribed as:  "Start taking NPH 35 units and 15 units Regular insulin before breakfast  Take no insulin with lunch  Take 10 units Regular insulin with supper  Take 10 units NPH at bedtime  Pt MUST bring glucose log to her appointment on Monday in [facility]"  Initial glucose noted to be 284 mg/dl today at 13:08 am.  Patient is currently 32 weeks 5 days and is ordered NPH 35 units before breakfast, NPH 10 units at bedtime, Novolin R 15 units before breakfast, and Novolin R 10 units before supper for inpatient glycemic control. While inpatient, please consider ordering Diabetic Pregnant Glycemic control order set for blood glucose correction.    Note consult for diabetes coordinator.  Will request Lenor Coffin, RN, Inpatient Diabetes Coordinator talk with patient in the morning on 12/2 to discuss diabetes control and safety of medications prescribed for diabetes during pregnancy.  Thanks, Orlando Penner, RN, MSN, CCRN Diabetes Coordinator Inpatient Diabetes Program [phone] (Team Pager) [phone] ([facility] office) [phone] [facility] office)

## 2013-06-04 NOTE — Progress Notes (Addendum)
This note also relates to the following rows which could not be included: CBG Lab Component - View only - Cannot attach notes to extension row

## 2013-06-04 NOTE — Progress Notes (Signed)
Pt able to draw up NPH Insulin correctly but unwilling to give own injections.  Stated that her mother gives her injections.  Pt was asked if she has ever tried to give her own injections using her thighs or abdomen and have not.  Pt guide book given with instructions on how to use video channel for Diabetes education, Breastfeeding and infant safety.

## 2013-06-04 NOTE — Progress Notes (Signed)
Antenatal Nutrition Assessment:  Currently  32 5/[redacted] weeks gestation, with Hyperglycemia . Height  67"  Weight 304 lbs  pre-pregnancy weight 295 lbs .Pre-pregnancy  BMI 46.3   IBW 135 lbs Total weight gain 9.lbs Weight gain goals 11-20 lbs Estimated needs: 21-2300 kcal/day, 90-100 grams protein/day, 2.5 liters fluid/day  Carbohydrate modified gestational diet tolerated well, appetite poor for past week. Reports Hx of n/v Current diet prescription will provide for increased needs.  Nutrition related labs CBG (last 3)   Recent Labs  06/01/13 2125 06/04/13 1043  GLUCAP 117* 284*      Nutrition Dx: Food and nutrition-related knowledge deficit r/t limited comprehension of previous education aeb pt report. Pt had education in June of this year at [facility]. Reports being noncompliant to diet  Nutrition education consult for Carbohydrate Modified Gestational Diabetic Diet completed.  "Meal  plan for gestational diabetics" handout given to patient.  Basic concepts reviewed.  Questions answered.  Patient verbalizes understanding.   Elisabeth Cara M.Odis Luster LDN Neonatal Nutrition Support Specialist Pager [phone]

## 2013-06-04 NOTE — Progress Notes (Signed)
P= 92 Pt. C/o of having upper abdominal pain in both quadrants and vomiting on Friday-- had gone to [facility] and was given phenergan. Has not had pain since.  C.o of lower abdominal/pelvic pressure.

## 2013-06-04 NOTE — Progress Notes (Signed)
UR completed 

## 2013-06-04 NOTE — H&P (Signed)
FACULTY PRACTICE ANTEPARTUM ADMISSION HISTORY AND PHYSICAL NOTE   History of Present Illness: [name] [name] is a 22 y.o. G2P0010 at [redacted]w[redacted]d dated by 8 week scan with uncontrolled Type II DM admitted for glycemic control.  Patient also has nausea and vomiting for the past week, treated with antiemetics but patient did not pick up prescribed antiemetics.  She reports decreased urination that she attributed to dehydration.  She was seen in [facility] today and found to have a fasting BS of 284 in clinic and was advised to have inpatient admission for glycemic control.  Patient reports the fetal movement as active. Patient reports uterine contraction  activity as none. Patient reports vaginal bleeding as scant staining. Patient describes fluid per vagina as none.  Fetal presentation is cephalic as of 06/01/13 ultrasound.  Patient Active Problem List   Diagnosis Date Noted  . Uncontrolled diabetes mellitus 06/04/2013  . Obesity complicating pregnancy in third trimester 05/21/2013  . Diabetes mellitus, antepartum 01/08/2013  . Cushing's syndrome**(see comments below) 01/08/2013  . PCOS (polycystic ovarian syndrome) 01/08/2013  . Asymptomatic bacteriuria in pregnancy in first trimester 01/08/2013  . Supervision of high-risk pregnancy 01/02/2013  ** Cushing's syndrome comments: Patient reports she was diagnosed prior to pregnancy after evaluation for irregular menses at [facility] by Arlana Lindau, NP.  She was told she had PCOS, Diabetes and Cushing's.  She has been treated by Dr. Talmage Nap (Endocrinology) who only treated her DM, and did not do anything for her Cushing's.  Of note, on review of UpToDate, patients with obesity and PCOS could have pseudo-Cushing's syndrome due to elevated cortisol levels (physiologic hypercortisolism) with these conditions.  ROI form signed to get information from Dr. Willeen Cass office and [facility]  Past Medical History  Diagnosis Date  . Diabetes mellitus   .  Gonorrhea   . PCOS (polycystic ovarian syndrome)   . Cushing syndrome   . Hirsutism   . Diabetes mellitus, antepartum    Past Surgical History  Procedure Laterality Date  . Abscess drainage    . Tooth extraction      OB History   Grav Para Term Preterm Abortions TAB SAB Ect Mult Living   2 0 0 0 1 0 1 0 0 0       History   Social History  . Marital Status: Single    Spouse Name: N/A    Number of Children: N/A  . Years of Education: N/A   Social History Main Topics  . Smoking status: Former Smoker    Quit date: 12/13/2012  . Smokeless tobacco: Never Used  . Alcohol Use: No  . Drug Use: No  . Sexual Activity: Yes    Partners: Male    Birth Control/ Protection: None   Other Topics Concern  . Not on file   Social History Narrative  . No narrative on file    Family History  Problem Relation Age of Onset  . COPD Maternal Grandmother   . Hypertension Maternal Grandmother   . Heart disease Maternal Grandmother   . Epilepsy Father   . Cancer Paternal Grandfather     No Known Allergies  Facility-administered medications prior to admission  Medication Dose Route Frequency Provider Last Rate Last Dose  . Tdap (BOOSTRIX) injection 0.5 mL  0.5 mL Intramuscular Once Lesly Dukes, MD       Prescriptions prior to admission  Medication Sig Dispense Refill  . insulin NPH (HUMULIN N,NOVOLIN N) 100 UNIT/ML injection Inject 35 Units into the  skin daily before breakfast.  10 mL  3  . insulin NPH (HUMULIN N,NOVOLIN N) 100 UNIT/ML injection Inject 10 Units into the skin at bedtime.  10 mL  3  . insulin regular (HUMULIN R) 100 units/mL injection Inject 0.15 mLs (15 Units total) into the skin daily before breakfast.  10 mL  12  . insulin regular (HUMULIN R) 100 units/mL injection Inject 0.1 mLs (10 Units total) into the skin daily before supper.  10 mL  12  . Insulin Syringe-Needle U-100 30G X 3/8" 1 ML MISC Inject 1 Syringe into the skin 3 (three) times daily.  100 each  3  .  ondansetron (ZOFRAN ODT) 4 MG disintegrating tablet Take 1 tablet (4 mg total) by mouth every 8 (eight) hours as needed for nausea or vomiting.  20 tablet  0  . Prenatal Vit-Fe Fumarate-FA (PRENATAL MULTIVITAMIN) TABS tablet Take 1 tablet by mouth daily at 12 noon.        . docusate sodium  100 mg Oral Daily  . insulin NPH  10 Units Subcutaneous QHS  . [START ON 06/05/2013] insulin NPH  35 Units Subcutaneous QAC breakfast  . insulin regular  10 Units Subcutaneous QAC supper  . [START ON 06/05/2013] insulin regular  15 Units Subcutaneous QAC breakfast  . prenatal multivitamin  1 tablet Oral Q1200  . sodium chloride  3 mL Intravenous Q12H   I have reviewed the patient's current medications.  Review of Systems - Negative except what is mentioned in HPI  Vitals:  BP 114/68  Pulse 87  Temp(Src) 98.2 F (36.8 C) (Oral)  Resp 20  Ht 5\' 7"  (1.702 m)  Wt 304 lb 4.8 oz (138.03 kg)  BMI 47.65 kg/m2  LMP 10/04/2012 Physical Examination:  General appearance - alert, well appearing, and in no distress Chest - clear to auscultation, no wheezes, rales or rhonchi, symmetric air entry Heart - normal rate and regular rhythm Abdomen - soft, nontender, nondistended, no masses or organomegaly Pelvic - examination not indicated Extremities - peripheral pulses normal, no pedal edema, no clubbing or cyanosis and DTRs 2+ bilaterally Membranes:intact Fetal Monitoring:Baseline: 140 bpm, Variability: Moderate, Accelerations: Reactive, Decelerations: Absent and tocometer flat  Labs:  Results for orders placed during the hospital encounter of 06/04/13 (from the past 24 hour(s))  URINALYSIS, ROUTINE W REFLEX MICROSCOPIC   Collection Time    06/04/13 12:05 PM      Result Value Range   Color, Urine YELLOW  YELLOW   APPearance CLEAR  CLEAR   Specific Gravity, Urine 1.020  1.005 - 1.030   pH 6.0  5.0 - 8.0   Glucose, UA >1000 (*) NEGATIVE mg/dL   Hgb urine dipstick NEGATIVE  NEGATIVE   Bilirubin Urine  NEGATIVE  NEGATIVE   Ketones, ur NEGATIVE  NEGATIVE mg/dL   Protein, ur NEGATIVE  NEGATIVE mg/dL   Urobilinogen, UA 0.2  0.0 - 1.0 mg/dL   Nitrite NEGATIVE  NEGATIVE   Leukocytes, UA NEGATIVE  NEGATIVE  URINE MICROSCOPIC-ADD ON   Collection Time    06/04/13 12:05 PM      Result Value Range   Squamous Epithelial / LPF FEW (*) RARE   WBC, UA 3-6  <3 WBC/hpf   RBC / HPF 3-6  <3 RBC/hpf   Bacteria, UA FEW (*) RARE  GLUCOSE, CAPILLARY   Collection Time    06/04/13 10:43 AM      Result Value Range   Glucose-Capillary 284 (*) 70 - 99 mg/dL  Imaging Studies: 05/21/2013   OBSTETRICAL ULTRASOUND  Normal interval growth (1020g 60th %tile), normal amniotic fluid volume, cephalic 05/22/2013    BPP 8/8   Assessment and Plan: Patient Active Problem List   Diagnosis Date Noted  . Uncontrolled diabetes mellitus 06/04/2013  . Obesity complicating pregnancy in third trimester 05/21/2013  . Diabetes mellitus, antepartum 01/08/2013  . Cushing's disease 01/08/2013  . PCOS (polycystic ovarian syndrome) 01/08/2013  . Asymptomatic bacteriuria in pregnancy in first trimester 01/08/2013  . Supervision of high-risk pregnancy 01/02/2013   Admit to Antenatal Unit Check CBGs fasting and 2 hr PP for now.  Will order usual insulin regimen and titrate regimen as needed.   Consulted Diabetic Education and Nutrition services Antiemetics as needed Will obtain records from Dr. Willeen Cass office and [facility] regarding ?Cushing's syndrome BPP ordered for 06/08/13 if patient is still admitted then; if not, she will get as outpatient Routine antenatal care   Jaynie Collins, MD, FACOG Attending Obstetrician & Gynecologist Faculty Practice, [facility] [place]

## 2013-06-04 NOTE — Progress Notes (Signed)
Pt did not bring CBG log.  Pt states fastings are about 100.  Pt has been vomiting for several days adn she is only taking her night time insulin.  Pt did not pick up antiemetic.  Pt not checking CBGs since seh is not taking her insulin or eating.  STressed importance of checking CBGs even when she is not eating.  Explained that Zofran will hep her with her nausea and vomiting.  Pt will pick prescription today.   [place] for growth on 06/18/13 Pt in 2x week testing. Fasting today is 284.  Will admit for fluids, meds and glycemic control. Pt hasn' turinated yet.  Will get UA as inpatient.

## 2013-06-05 LAB — GLUCOSE, CAPILLARY
Glucose-Capillary: 117 mg/dL — ABNORMAL HIGH (ref 70–99)
Glucose-Capillary: 174 mg/dL — ABNORMAL HIGH (ref 70–99)

## 2013-06-05 LAB — URINE CULTURE: Colony Count: 70000

## 2013-06-05 MED ORDER — INSULIN REGULAR HUMAN 100 UNIT/ML IJ SOLN
20.0000 [IU] | Freq: Every day | INTRAMUSCULAR | Status: DC
  Administered 2013-06-05: 20 [IU] via SUBCUTANEOUS

## 2013-06-05 MED ORDER — INSULIN NPH (HUMAN) (ISOPHANE) 100 UNIT/ML ~~LOC~~ SUSP: 15.0000 [IU] | Freq: Every day | SUBCUTANEOUS | Status: DC

## 2013-06-05 MED ORDER — INSULIN ASPART 100 UNIT/ML ~~LOC~~ SOLN
1.0000 [IU] | Freq: Three times a day (TID) | SUBCUTANEOUS | Status: DC
  Administered 2013-06-06: 12 [IU] via SUBCUTANEOUS
  Administered 2013-06-06: 6 [IU] via SUBCUTANEOUS

## 2013-06-05 MED ORDER — INSULIN REGULAR HUMAN 100 UNIT/ML IJ SOLN: 15.0000 [IU] | Freq: Every day | INTRAMUSCULAR | Status: DC

## 2013-06-05 MED ORDER — INSULIN NPH (HUMAN) (ISOPHANE) 100 UNIT/ML ~~LOC~~ SUSP
40.0000 [IU] | Freq: Every day | SUBCUTANEOUS | Status: DC
  Administered 2013-06-05: 40 [IU] via SUBCUTANEOUS
  Filled 2013-06-05: qty 10

## 2013-06-05 MED ORDER — INSULIN ASPART 100 UNIT/ML ~~LOC~~ SOLN
15.0000 [IU] | Freq: Once | SUBCUTANEOUS | Status: AC
  Administered 2013-06-05: 100 [IU] via SUBCUTANEOUS

## 2013-06-05 MED ORDER — INSULIN NPH (HUMAN) (ISOPHANE) 100 UNIT/ML ~~LOC~~ SUSP
20.0000 [IU] | Freq: Two times a day (BID) | SUBCUTANEOUS | Status: DC
  Administered 2013-06-05 – 2013-06-06 (×2): 20 [IU] via SUBCUTANEOUS

## 2013-06-05 NOTE — Progress Notes (Signed)
FACULTY PRACTICE ANTEPARTUM COMPREHENSIVE PROGRESS NOTE  [name] [name] is a 22 y.o. G2P0010 at [redacted]w[redacted]d who is admitted for poorly controlled Type II DM. Estimated Date of Delivery: 07/25/13 Fetal presentation is cephalic.  Length of Stay:  1 Days. 06/04/2013  Subjective: Resolved nausea and vomiting, eating with no problems. Patient reports good fetal movement.  She reports no uterine contractions, no bleeding and no loss of fluid per vagina.  Vitals:  Blood pressure 116/64, pulse 89, temperature 98.6 F (37 C), temperature source Oral, resp. rate 20, height 5\' 7"  (1.702 m), weight 304 lb 4.8 oz (138.03 kg), last menstrual period 10/04/2012. Physical Examination: General appearance: alert, well appearing, and in no distress Abdomen: soft, gravid, nontender, nondistended, no masses or organomegaly Cervical Exam: Not evaluated Extremities: extremities normal, atraumatic, no cyanosis or edema and Homans sign is negative, no sign of DVT with DTRs 2+ bilaterally Membranes:intact  Fetal Monitoring:  Baseline: 150 bpm, Variability: Moderate, Accelerations: Reactive and Decelerations: Absent Tocometry: rare contractions  Results for orders placed during the hospital encounter of 06/04/13 (from the past 48 hour(s))  URINALYSIS, ROUTINE W REFLEX MICROSCOPIC     Status: Abnormal   Collection Time    06/04/13 12:05 PM      Result Value Range   Color, Urine YELLOW  YELLOW   APPearance CLEAR  CLEAR   Specific Gravity, Urine 1.020  1.005 - 1.030   pH 6.0  5.0 - 8.0   Glucose, UA >1000 (*) NEGATIVE mg/dL   Hgb urine dipstick NEGATIVE  NEGATIVE   Bilirubin Urine NEGATIVE  NEGATIVE   Ketones, ur NEGATIVE  NEGATIVE mg/dL   Protein, ur NEGATIVE  NEGATIVE mg/dL   Urobilinogen, UA 0.2  0.0 - 1.0 mg/dL   Nitrite NEGATIVE  NEGATIVE   Leukocytes, UA NEGATIVE  NEGATIVE  URINE MICROSCOPIC-ADD ON     Status: Abnormal   Collection Time    06/04/13 12:05 PM      Result Value Range   Squamous Epithelial  / LPF FEW (*) RARE   WBC, UA 3-6  <3 WBC/hpf   RBC / HPF 3-6  <3 RBC/hpf   Bacteria, UA FEW (*) RARE  CBC     Status: Abnormal   Collection Time    06/04/13 12:40 PM      Result Value Range   WBC 7.8  4.0 - 10.5 K/uL   RBC 3.43 (*) 3.87 - 5.11 MIL/uL   Hemoglobin 10.5 (*) 12.0 - 15.0 g/dL   HCT 16.1 (*) 09.6 - 04.5 %   MCV 90.4  78.0 - 100.0 fL   MCH 30.6  26.0 - 34.0 pg   MCHC 33.9  30.0 - 36.0 g/dL   RDW 40.9  81.1 - 91.4 %   Platelets 219  150 - 400 K/uL  TYPE AND SCREEN     Status: None   Collection Time    06/04/13 12:40 PM      Result Value Range   ABO/RH(D) O POS     Antibody Screen NEG     Sample Expiration 06/07/2013    HEMOGLOBIN A1C     Status: Abnormal   Collection Time    06/04/13 12:40 PM      Result Value Range   Hemoglobin A1C 6.4 (*) <5.7 %   Comment: (NOTE)  According to the ADA Clinical Practice Recommendations for 2011, when     HbA1c is used as a screening test:      >=6.5%   Diagnostic of Diabetes Mellitus               (if abnormal result is confirmed)     5.7-6.4%   Increased risk of developing Diabetes Mellitus     References:Diagnosis and Classification of Diabetes Mellitus,Diabetes     Care,2011,34(Suppl 1):S62-S69 and Standards of Medical Care in             Diabetes - 2011,Diabetes Care,2011,34 (Suppl 1):S11-S61.   Mean Plasma Glucose 137 (*) <117 mg/dL   Comment: Performed at [facility]  COMPREHENSIVE METABOLIC PANEL     Status: Abnormal   Collection Time    06/04/13 12:40 PM      Result Value Range   Sodium 134 (*) 135 - 145 mEq/L   Potassium 4.1  3.5 - 5.1 mEq/L   Chloride 102  96 - 112 mEq/L   CO2 22  19 - 32 mEq/L   Glucose, Bld 215 (*) 70 - 99 mg/dL   BUN 8  6 - 23 mg/dL   Creatinine, Ser 1.30  0.50 - 1.10 mg/dL   Calcium 9.4  8.4 - 86.5 mg/dL   Total Protein 7.7  6.0 - 8.3 g/dL   Albumin 2.8 (*) 3.5 - 5.2 g/dL   AST 37  0 - 37 U/L   ALT 124  (*) 0 - 35 U/L   Alkaline Phosphatase 115  39 - 117 U/L   Total Bilirubin 0.4  0.3 - 1.2 mg/dL   GFR calc non Af Amer >90  >90 mL/min   GFR calc Af Amer >90  >90 mL/min   Comment: (NOTE)     The eGFR has been calculated using the CKD EPI equation.     This calculation has not been validated in all clinical situations.     eGFR's persistently <90 mL/min signify possible Chronic Kidney     Disease.  GLUCOSE, CAPILLARY     Status: Abnormal   Collection Time    06/04/13  5:01 PM      Result Value Range   Glucose-Capillary 187 (*) 70 - 99 mg/dL   Comment 1 Notify RN     Comment 2 Documented in Chart    GLUCOSE, CAPILLARY     Status: Abnormal   Collection Time    06/04/13 11:08 PM      Result Value Range   Glucose-Capillary 226 (*) 70 - 99 mg/dL  GLUCOSE, CAPILLARY     Status: Abnormal   Collection Time    06/05/13  6:39 AM      Result Value Range   Glucose-Capillary 157 (*) 70 - 99 mg/dL    Current scheduled medications . docusate sodium  100 mg Oral Daily  . insulin NPH  15 Units Subcutaneous QHS  . insulin NPH  40 Units Subcutaneous QAC breakfast  . insulin regular  15 Units Subcutaneous QAC supper  . insulin regular  20 Units Subcutaneous QAC breakfast  . prenatal multivitamin  1 tablet Oral Q1200  . sodium chloride  3 mL Intravenous Q12H   I have reviewed the patient's current medications.  ASSESSMENT: Patient Active Problem List   Diagnosis Date Noted  . Uncontrolled diabetes mellitus 06/04/2013  . Obesity complicating pregnancy in third trimester 05/21/2013  . Diabetes mellitus, antepartum 01/08/2013  . Cushings syndrome 01/08/2013  . PCOS (polycystic  ovarian syndrome) 01/08/2013  . Asymptomatic bacteriuria in pregnancy in first trimester 01/08/2013  . Supervision of high-risk pregnancy 01/02/2013    PLAN: Increased all insulin dosages by 4 units; new regimen NPH 40/15; Regular 20/15. Will await Lenor Coffin, CDE recommendations and continue to titrate regimen as  needed Follow up pending lab results; records about ?Cushing's syndrome Continue routine inpatient antenatal care for now.   Jaynie Collins, MD, FACOG Attending Obstetrician & Gynecologist Faculty Practice, [facility] [place]

## 2013-06-05 NOTE — Progress Notes (Signed)
Inpatient Diabetes Program Recommendations  Diabetes Treatment Program Recommendations  ADA Standards of Care 2012 Diabetes in Pregnancy Target Glucose Ranges:  Fasting: 60 - 90 mg/dL Preprandial: 60 - 161 mg/dL 1 hr postprandial: Less than 140mg /dL (from first bite of meal) 2 hr postprandial: Less than 120 mg/dL (from first bit of meal)   Consult for "uncontrolled Diabetes in pregnancy: Have spoken at length with this patient regarding her insulin regimen presently and in the past over the past year. Pt states she was diagnosed only approximately a year ago.  States she was on Metformin, and then Lantus and Humalog.  Pt states she was confused as to what insulin she should be taking, emphasizing that she is concerned about her baby more than herself.  She states that her insulin regimen has been changed several times from Lantus to NPH and from Humalog to Regular.  She states her glucose was still uncontrolled after being switched to NPH and Regular. At one point, she states she 'quit taking everything because none of it was doing any good".  I explained to her the need for the NPH vs the lantus as well as the benefit of taking the rapid acting Novolog three times per day vs the Regular twice per day. We discussed her counting carbohydrates and she states she thinks she is able to eat according to the directions given to her by the RD, i.e. 30 grams for BF, 45-60 for lunch and supper, and 30 grams for snacks between meals. Pt states she is most willing to use the Novolog meal coverage three times per day and follow the meal plan.  She does not feel comfortable basing her meal coverage (novolog) on what she is going to eat, but she is willing to take a set amount for each meal, following the meal pattern.  She understands that this dosage may be adjusted each week and that her log book is a must for each clinic visit in order to adjust her doses.  I emphasized to patient that she needs to take her  basal insulin on this regimen regardless of being able to eat.   It is my suggestion that pt be prescribed the antenatal regimen in the order set, the Diabetic Pregnant order set using the calculations outlined according to wt and gestational age. I have left a message for Dr. Macon Large, however I am now told that Dr Debroah Loop is now on call. Will write recommendations and assist with entering orders as needed/requested. Calculations from the table are as follows: NPH 20 units before breakfast and 20 units at HS Novolog meal coverage is 1 unit per 4 grams carbohydrate:  Breakfast- 30 grams (no juices or fruit in am)-7 units Novolog           Lunch and Dinner/supper: 45 grams-11 units (if increases to 60 grams, use 15 units No coverage for snacks, and pt still to check her glucose fasting and 2 hrs post-prandial.  (Could use the correction scale while here based on total daily dose of 122 units which begins with 5 units for fasting glucose of 91-120, and 6 units for post-prandial glucose of 121-160 and increase by 1-2 units per 40 mg/dL greater than goal. Again, I would not expect pt to be able to do this at home as it sounds as if it may be overwhelming to her).  Please feel free to call for assistance.  I do strongly recommend using the 4 injections per day using the Diabetic Pregnant order  set. Thank you, Lenor Coffin, RN, Diabetes CNS [phone])

## 2013-06-06 ENCOUNTER — Encounter: Payer: Self-pay | Admitting: *Deleted

## 2013-06-06 LAB — CREATININE, URINE, 24 HOUR
Collection Interval-UCRE24: 24 hours
Creatinine, 24H Ur: 1965 mg/d — ABNORMAL HIGH (ref 700–1800)
Creatinine, Urine: 80.2 mg/dL
Urine Total Volume-UCRE24: 2450 mL

## 2013-06-06 LAB — PROTEIN, URINE, 24 HOUR
Collection Interval-UPROT: 24 hours
Protein, 24H Urine: 270 mg/d — ABNORMAL HIGH (ref 50–100)
Protein, Urine: 11 mg/dL
Urine Total Volume-UPROT: 2450 mL

## 2013-06-06 LAB — GLUCOSE, CAPILLARY: Glucose-Capillary: 137 mg/dL — ABNORMAL HIGH (ref 70–99)

## 2013-06-06 MED ORDER — INSULIN NPH (HUMAN) (ISOPHANE) 100 UNIT/ML ~~LOC~~ SUSP: 20.0000 [IU] | Freq: Two times a day (BID) | SUBCUTANEOUS | Status: DC

## 2013-06-06 MED ORDER — INSULIN REGULAR HUMAN 100 UNIT/ML IJ SOLN: 7.0000 [IU] | Freq: Every day | INTRAMUSCULAR | Status: DC

## 2013-06-06 MED ORDER — INSULIN REGULAR HUMAN 100 UNIT/ML IJ SOLN: 10.0000 [IU] | Freq: Two times a day (BID) | INTRAMUSCULAR | Status: DC

## 2013-06-06 MED ORDER — INSULIN REGULAR HUMAN 100 UNIT/ML IJ SOLN: 11.0000 [IU] | Freq: Two times a day (BID) | INTRAMUSCULAR | Status: DC

## 2013-06-06 MED ORDER — INSULIN NPH (HUMAN) (ISOPHANE) 100 UNIT/ML ~~LOC~~ SUSP: 20.0000 [IU] | Freq: Every day | SUBCUTANEOUS | Status: DC

## 2013-06-06 MED ORDER — INSULIN NPH (HUMAN) (ISOPHANE) 100 UNIT/ML ~~LOC~~ SUSP: 23.0000 [IU] | Freq: Every evening | SUBCUTANEOUS | Status: DC

## 2013-06-06 NOTE — Progress Notes (Addendum)
Patient ID: [name] [name], female   DOB: 06-Dec-1990, 22 y.o.   MRN: [id number] FACULTY PRACTICE ANTEPARTUM(COMPREHENSIVE) NOTE  [name] [name] is a 22 y.o. G2P0010 at [redacted]w[redacted]d by best clinical estimate who is admitted for blood glucose control.   Fetal presentation is cephalic. Length of Stay:  2  Days  Subjective: No nausea Patient reports the fetal movement as active. Patient reports uterine contraction  activity as none. Patient reports  vaginal bleeding as none. Patient describes fluid per vagina as None.  Vitals:  Blood pressure 118/64, pulse 109, temperature 98.5 F (36.9 C), temperature source Oral, resp. rate 20, height 5\' 7"  (1.702 m), weight 304 lb 4.8 oz (138.03 kg), last menstrual period 10/04/2012. Physical Examination:  General appearance - alert, well appearing, and in no distress Heart - normal rate and regular rhythm Abdomen - soft, nontender, nondistended Fundal Height:  size equals dates Cervical Exam: Not evaluated. Extremities: extremities normal, atraumatic, no cyanosis or edema Membranes:intact  Fetal Monitoring:  Baseline: 150 bpm, Variability: Good {> 6 bpm), Accelerations: Reactive, Decelerations: Absent and    Labs: FBS 157, 2 hr pp 117, 174, 176  Results for orders placed during the hospital encounter of 06/04/13 (from the past 24 hour(s))  GLUCOSE, CAPILLARY   Collection Time    06/05/13 12:02 PM      Result Value Range   Glucose-Capillary 117 (*) 70 - 99 mg/dL   Comment 1 Notify RN     Comment 2 Documented in Chart    GLUCOSE, CAPILLARY   Collection Time    06/05/13  7:43 PM      Result Value Range   Glucose-Capillary 174 (*) 70 - 99 mg/dL  GLUCOSE, CAPILLARY   Collection Time    06/06/13 12:14 AM      Result Value Range   Glucose-Capillary 176 (*) 70 - 99 mg/dL  GLUCOSE, CAPILLARY   Collection Time    06/06/13  6:41 AM      Result Value Range   Glucose-Capillary 137 (*) 70 - 99 mg/dL    Imaging Studies:     Currently EPIC will not  allow sonographic studies to automatically populate into notes.  In the meantime, copy and paste results into note or free text.  Medications:  Scheduled . docusate sodium  100 mg Oral Daily  . insulin aspart  1-12 Units Subcutaneous TID WC  . insulin NPH  20 Units Subcutaneous BID AC & HS  . prenatal multivitamin  1 tablet Oral Q1200  . sodium chloride  3 mL Intravenous Q12H   I have reviewed the patient's current medications.  ASSESSMENT: Patient Active Problem List   Diagnosis Date Noted  . Uncontrolled diabetes mellitus 06/04/2013  . Obesity complicating pregnancy in third trimester 05/21/2013  . Diabetes mellitus, antepartum 01/08/2013  . Cushings syndrome 01/08/2013  . PCOS (polycystic ovarian syndrome) 01/08/2013  . Asymptomatic bacteriuria in pregnancy in first trimester 01/08/2013  . Supervision of high-risk pregnancy 01/02/2013    PLAN: Follow blood glucose QID with insulin regimen recommended by Lenor Coffin  Carollee Nussbaumer 06/06/2013,6:44 AM

## 2013-06-06 NOTE — Discharge Summary (Signed)
Physician Discharge Summary  Patient ID: [name] [name] MRN: [id number] DOB/AGE: 1991/05/16 22 y.o.  Admit date: 06/04/2013 Discharge date: 06/06/2013  Admission Diagnoses: Nausea and vomiting, poorly controlled Diabetes  Discharge Diagnoses:  Principal Problem:   Uncontrolled diabetes mellitus Active Problems:   Diabetes mellitus, antepartum   Cushings syndrome   Obesity complicating pregnancy in third trimester   Discharged Condition: fair  Hospital Course: Pt was admitted for nausea and vomiting and poor control of her diabetes. Pt has met with the diabetic education nurse and established a new regimen for her glucose control. Pt started on regimen on 12/2 and has had improved PP sugars this morning. But will continue to adjust in clinic. Emphasized importance of regular appointments to make adjustments.  Consults: Diabetic nurse  Discharge Exam: Blood pressure 109/53, pulse 86, temperature 98.5 F (36.9 C), temperature source Oral, resp. rate 18, height 5\' 7"  (1.702 m), weight 136.986 kg (302 lb), last menstrual period 10/04/2012.  General appearance - alert, well appearing, and in no distress  Heart - normal rate and regular rhythm  Abdomen - soft, nontender, nondistended  Fundal Height: size equals dates  Cervical Exam: Not evaluated.  Extremities: extremities normal, atraumatic, no cyanosis or edema  Membranes:intact Baseline: 150 bpm, Variability: Good {> 6 bpm), Accelerations: Reactive, Decelerations: Absent and    Disposition: 01-Home or Self Care   Future Appointments Provider Department Dept Phone   06/07/2013 2:00 PM [facility] [phone]   06/08/2013 8:30 AM [facility] [place] [facility] [place] [facility] [phone]   06/11/2013 10:30 AM [facility] [facility] [phone]   06/18/2013 10:30 AM [facility] [place] [facility] [place] [facility] [phone]       Medication List         insulin  NPH 100 UNIT/ML injection  Commonly known as:  HUMULIN N,NOVOLIN N  Inject 20 Units into the skin 2 (two) times daily at 8 am and 10 pm.     insulin regular 100 units/mL injection  Commonly known as:  HUMULIN R  Inject 0.07 mLs (7 Units total) into the skin daily before breakfast.     insulin regular 100 units/mL injection  Commonly known as:  HUMULIN R  Inject 0.11-0.15 mLs (11-15 Units total) into the skin 2 (two) times daily before lunch and supper.     Insulin Syringe-Needle U-100 30G X 3/8" 1 ML Misc  Inject 1 Syringe into the skin 3 (three) times daily.     ondansetron 4 MG disintegrating tablet  Commonly known as:  ZOFRAN ODT  Take 1 tablet (4 mg total) by mouth every 8 (eight) hours as needed for nausea or vomiting.     prenatal multivitamin Tabs tablet  Take 1 tablet by mouth daily at 12 noon.           Follow-up Information   Follow up with [facility]. (you have a NST scheduled for 12/4 at 2:00pm)    Specialty:  Obstetrics and Gynecology   Contact information:   [address] [place] [address] [phone]      Signed: Tawana Scale 06/06/2013, 5:50 PM

## 2013-06-07 ENCOUNTER — Ambulatory Visit (INDEPENDENT_AMBULATORY_CARE_PROVIDER_SITE_OTHER): Payer: Medicaid Other | Admitting: *Deleted

## 2013-06-07 VITALS — BP 118/62

## 2013-06-07 DIAGNOSIS — O24919 Unspecified diabetes mellitus in pregnancy, unspecified trimester: Secondary | ICD-10-CM

## 2013-06-07 DIAGNOSIS — O24913 Unspecified diabetes mellitus in pregnancy, third trimester: Secondary | ICD-10-CM

## 2013-06-07 NOTE — Progress Notes (Signed)
P = 100  Pt was d/c'd from hospital yesterday. Has only checked CBG 3 times since d/c- all elevated.  Strict adherence to diet discussed.  Pt has BPP tomorrow @ [facility].

## 2013-06-08 ENCOUNTER — Ambulatory Visit (HOSPITAL_COMMUNITY)
Admit: 2013-06-08 | Discharge: 2013-06-08 | Disposition: A | Discharge status: Home / Self Care | Payer: Medicaid Other | Attending: Obstetrics & Gynecology | Admitting: Obstetrics & Gynecology

## 2013-06-08 ENCOUNTER — Other Ambulatory Visit: Payer: Self-pay | Admitting: Obstetrics & Gynecology

## 2013-06-08 DIAGNOSIS — O24919 Unspecified diabetes mellitus in pregnancy, unspecified trimester: Secondary | ICD-10-CM | POA: Insufficient documentation

## 2013-06-08 DIAGNOSIS — E119 Type 2 diabetes mellitus without complications: Secondary | ICD-10-CM

## 2013-06-08 DIAGNOSIS — E669 Obesity, unspecified: Secondary | ICD-10-CM | POA: Insufficient documentation

## 2013-06-09 LAB — CORTISOL, URINE, FREE
Creatinine, Urine mg/day-CORTUR: 1.95 g/(24.h) (ref 0.63–2.50)
Volume, Urine-CORTUR: 2450 mL

## 2013-06-11 ENCOUNTER — Ambulatory Visit (INDEPENDENT_AMBULATORY_CARE_PROVIDER_SITE_OTHER): Payer: Medicaid Other | Admitting: Family Medicine

## 2013-06-11 ENCOUNTER — Encounter: Payer: Self-pay | Admitting: Family Medicine

## 2013-06-11 VITALS — BP 118/75 | Temp 98.3°F | Wt 304.0 lb

## 2013-06-11 DIAGNOSIS — O24913 Unspecified diabetes mellitus in pregnancy, third trimester: Secondary | ICD-10-CM

## 2013-06-11 DIAGNOSIS — O24919 Unspecified diabetes mellitus in pregnancy, unspecified trimester: Secondary | ICD-10-CM

## 2013-06-11 MED ORDER — INSULIN REGULAR HUMAN 100 UNIT/ML IJ SOLN: 8.0000 [IU] | Freq: Every day | INTRAMUSCULAR | Status: DC

## 2013-06-11 MED ORDER — INSULIN NPH (HUMAN) (ISOPHANE) 100 UNIT/ML ~~LOC~~ SUSP: 26.0000 [IU] | Freq: Every evening | SUBCUTANEOUS | Status: DC

## 2013-06-11 MED ORDER — INSULIN REGULAR HUMAN 100 UNIT/ML IJ SOLN: 16.0000 [IU] | Freq: Two times a day (BID) | INTRAMUSCULAR | Status: DC

## 2013-06-11 MED ORDER — INSULIN NPH (HUMAN) (ISOPHANE) 100 UNIT/ML ~~LOC~~ SUSP: 23.0000 [IU] | Freq: Every day | SUBCUTANEOUS | Status: DC

## 2013-06-11 NOTE — Progress Notes (Signed)
P-84  

## 2013-06-11 NOTE — Patient Instructions (Addendum)
Following an appropriate diet and keeping your blood sugar under control is the most important thing to do for your health and that of your unborn baby.  Please check your blood sugar 4 times daily.  Please keep accurate BS logs and bring them with you to every visit.  Please bring your meter also.  Goals for Blood sugar should be: 1. Fasting (first thing in the morning before eating) should be less than 90.   2.  2 hours after meals should be less than 120.  Please eat 3 meals and 3 snacks.  Include protein (meat, dairy-cheese, eggs, nuts) with all meals.  Be mindful that carbohydrates increase your blood sugar.  Not just sweet food (cookies, cake, donuts, fruit, juice, soda) but also bread, pasta, rice, and potatoes.  You have to limit how many carbs you are eating.  Adding exercise, as little as 30 minutes a day can decrease your blood sugar.  Gestational Diabetes Mellitus Gestational diabetes mellitus, often simply referred to as gestational diabetes, is a type of diabetes that some women develop during pregnancy. In gestational diabetes, the pancreas does not make enough insulin (a hormone), the cells are less responsive to the insulin that is made (insulin resistance), or both.Normally, insulin moves sugars from food into the tissue cells. The tissue cells use the sugars for energy. The lack of insulin or the lack of normal response to insulin causes excess sugars to build up in the blood instead of going into the tissue cells. As a result, high blood sugar (hyperglycemia) develops. The effect of high sugar (glucose) levels can cause many complications.  RISK FACTORS You have an increased chance of developing gestational diabetes if you have a family history of diabetes and also have one or more of the following risk factors:  A body mass index over 30 (obesity).  A previous pregnancy with gestational diabetes.  An older age at the time of pregnancy. If blood glucose levels are kept  in the normal range during pregnancy, women can have a healthy pregnancy. If your blood glucose levels are not well controlled, there may be risks to you, your unborn baby (fetus), your labor and delivery, or your newborn baby.  SYMPTOMS  If symptoms are experienced, they are much like symptoms you would normally expect during pregnancy. The symptoms of gestational diabetes include:   Increased thirst (polydipsia).  Increased urination (polyuria).  Increased urination during the night (nocturia).  Weight loss. This weight loss may be rapid.  Frequent, recurring infections.  Tiredness (fatigue).  Weakness.  Vision changes, such as blurred vision.  Fruity smell to your breath.  Abdominal pain. DIAGNOSIS Diabetes is diagnosed when blood glucose levels are increased. Your blood glucose level may be checked by one or more of the following blood tests:  A fasting blood glucose test. You will not be allowed to eat for at least 8 hours before a blood sample is taken.  A random blood glucose test. Your blood glucose is checked at any time of the day regardless of when you ate.  A hemoglobin A1c blood glucose test. A hemoglobin A1c test provides information about blood glucose control over the previous 3 months.  An oral glucose tolerance test (OGTT). Your blood glucose is measured after you have not eaten (fasted) for 1 3 hours and then after you drink a glucose-containing beverage. Since the hormones that cause insulin resistance are highest at about 24 28 weeks of a pregnancy, an OGTT is usually performed during that   time. If you have risk factors for gestational diabetes, your caregiver may test you for gestational diabetes earlier than 24 weeks of pregnancy. TREATMENT   You will need to take diabetes medicine or insulin daily to keep blood glucose levels in the desired range.  You will need to match insulin dosing with exercise and healthy food choices. The treatment goal is to  maintain the before meal (preprandial), bedtime, and overnight blood glucose level at 60 99 mg/dL during pregnancy. The treatment goal is to further maintain peak after meal blood sugar (postprandial glucose) level at 100 140 mg/dL. HOME CARE INSTRUCTIONS   Have your hemoglobin A1c level checked twice a year.  Perform daily blood glucose monitoring as directed by your caregiver. It is common to perform frequent blood glucose monitoring.  Monitor urine ketones when you are ill and as directed by your caregiver.  Take your diabetes medicine and insulin as directed by your caregiver to maintain your blood glucose level in the desired range.  Never run out of diabetes medicine or insulin. It is needed every day.  Adjust insulin based on your intake of carbohydrates. Carbohydrates can raise blood glucose levels but need to be included in your diet. Carbohydrates provide vitamins, minerals, and fiber which are an essential part of a healthy diet. Carbohydrates are found in fruits, vegetables, whole grains, dairy products, legumes, and foods containing added sugars.    Eat healthy foods. Alternate 3 meals with 3 snacks.  Maintain a healthy weight gain. The usual total expected weight gain varies according to your prepregnancy body mass index (BMI).  Carry a medical alert card or wear your medical alert jewelry.  Carry a 15 gram carbohydrate snack with you at all times to treat low blood glucose (hypoglycemia). Some examples of 15 gram carbohydrate snacks include:  Glucose tablets, 3 or 4   Glucose gel, 15 gram tube  Raisins, 2 tablespoons (24 g)  Jelly beans, 6  Animal crackers, 8  Fruit juice, regular soda, or low fat milk, 4 ounces (120 mL)  Gummy treats, 9    Recognize hypoglycemia. Hypoglycemia during pregnancy occurs with blood glucose levels of 60 mg/dL and below. The risk for hypoglycemia increases when fasting or skipping meals, during or after intense exercise, and during  sleep. Hypoglycemia symptoms can include:  Tremors or shakes.  Decreased ability to concentrate.  Sweating.  Increased heart rate.  Headache.  Dry mouth.  Hunger.  Irritability.  Anxiety.  Restless sleep.  Altered speech or coordination.  Confusion.  Treat hypoglycemia promptly. If you are alert and able to safely swallow, follow the 15:15 rule:  Take 15 20 grams of rapid-acting glucose or carbohydrate. Rapid-acting options include glucose gel, glucose tablets, or 4 ounces (120 mL) of fruit juice, regular soda, or low fat milk.  Check your blood glucose level 15 minutes after taking the glucose.   Take 15 20 grams more of glucose if the repeat blood glucose level is still 70 mg/dL or below.  Eat a meal or snack within 1 hour once blood glucose levels return to normal.  Be alert to polyuria and polydipsia which are early signs of hyperglycemia. An early awareness of hyperglycemia allows for prompt treatment. Treat hyperglycemia as directed by your caregiver.  Engage in at least 30 minutes of physical activity a day or as directed by your caregiver. Ten minutes of physical activity timed 30 minutes after each meal is encouraged to control postprandial blood glucose levels.  Adjust your insulin dosing and   food intake as needed if you start a new exercise or sport.  Follow your sick day plan at any time you are unable to eat or drink as usual.  Avoid tobacco and alcohol use.  Follow up with your caregiver regularly.  Follow the advice of your caregiver regarding your prenatal and post-delivery (postpartum) appointments, meal planning, exercise, medicines, vitamins, blood tests, other medical tests, and physical activities.  Perform daily skin and foot care. Examine your skin and feet daily for cuts, bruises, redness, nail problems, bleeding, blisters, or sores.  Brush your teeth and gums at least twice a day and floss at least once a day. Follow up with your dentist  regularly.  Schedule an eye exam during the first trimester of your pregnancy or as directed by your caregiver.  Share your diabetes management plan with your workplace or school.  Stay up-to-date with immunizations.  Learn to manage stress.  Obtain ongoing diabetes education and support as needed. SEEK MEDICAL CARE IF:   You are unable to eat food or drink fluids for more than 6 hours.  You have nausea and vomiting for more than 6 hours.  You have a blood glucose level of 200 mg/dL and you have ketones in your urine.  There is a change in mental status.  You develop vision problems.  You have a persistent headache.  You have upper abdominal pain or discomfort.  You develop an additional serious illness.  You have diarrhea for more than 6 hours.  You have been sick or have had a fever for a couple of days and are not getting better. SEEK IMMEDIATE MEDICAL CARE IF:   You have difficulty breathing.  You no longer feel the baby moving.  You are bleeding or have discharge from your vagina.  You start having premature contractions or labor. MAKE SURE YOU:  Understand these instructions.  Will watch your condition.  Will get help right away if you are not doing well or get worse. Document Released: 09/27/2000 Document Revised: 10/16/2012 Document Reviewed: 01/18/2012 [employer] Patient Information 2014 [employer].  Breastfeeding Deciding to breastfeed is one of the best choices you can make for you and your baby. A change in hormones during pregnancy causes your breast tissue to grow and increases the number and size of your milk ducts. These hormones also allow proteins, sugars, and fats from your blood supply to make breast milk in your milk-producing glands. Hormones prevent breast milk from being released before your baby is born as well as prompt milk flow after birth. Once breastfeeding has begun, thoughts of your baby, as well as his or her sucking or crying,  can stimulate the release of milk from your milk-producing glands.  BENEFITS OF BREASTFEEDING For Your Baby  Your first milk (colostrum) helps your baby's digestive system function better.   There are antibodies in your milk that help your baby fight off infections.   Your baby has a lower incidence of asthma, allergies, and sudden infant death syndrome.   The nutrients in breast milk are better for your baby than infant formulas and are designed uniquely for your baby's needs.   Breast milk improves your baby's brain development.   Your baby is less likely to develop other conditions, such as childhood obesity, asthma, or type 2 diabetes mellitus.  For You   Breastfeeding helps to create a very special bond between you and your baby.   Breastfeeding is convenient. Breast milk is always available at the correct temperature   and costs nothing.   Breastfeeding helps to burn calories and helps you lose the weight gained during pregnancy.   Breastfeeding makes your uterus contract to its prepregnancy size faster and slows bleeding (lochia) after you give birth.   Breastfeeding helps to lower your risk of developing type 2 diabetes mellitus, osteoporosis, and breast or ovarian cancer later in life. SIGNS THAT YOUR BABY IS HUNGRY Early Signs of Hunger  Increased alertness or activity.  Stretching.  Movement of the head from side to side.  Movement of the head and opening of the mouth when the corner of the mouth or cheek is stroked (rooting).  Increased sucking sounds, smacking lips, cooing, sighing, or squeaking.  Hand-to-mouth movements.  Increased sucking of fingers or hands. Late Signs of Hunger  Fussing.  Intermittent crying. Extreme Signs of Hunger Signs of extreme hunger will require calming and consoling before your baby will be able to breastfeed successfully. Do not wait for the following signs of extreme hunger to occur before you initiate breastfeeding:    Restlessness.  A loud, strong cry.   Screaming. BREASTFEEDING BASICS Breastfeeding Initiation  Find a comfortable place to sit or lie down, with your neck and back well supported.  Place a pillow or rolled up blanket under your baby to bring him or her to the level of your breast (if you are seated). Nursing pillows are specially designed to help support your arms and your baby while you breastfeed.  Make sure that your baby's abdomen is facing your abdomen.   Gently massage your breast. With your fingertips, massage from your chest wall toward your nipple in a circular motion. This encourages milk flow. You may need to continue this action during the feeding if your milk flows slowly.  Support your breast with 4 fingers underneath and your thumb above your nipple. Make sure your fingers are well away from your nipple and your baby's mouth.   Stroke your baby's lips gently with your finger or nipple.   When your baby's mouth is open wide enough, quickly bring your baby to your breast, placing your entire nipple and as much of the colored area around your nipple (areola) as possible into your baby's mouth.   More areola should be visible above your baby's upper lip than below the lower lip.   Your baby's tongue should be between his or her lower gum and your breast.   Ensure that your baby's mouth is correctly positioned around your nipple (latched). Your baby's lips should create a seal on your breast and be turned out (everted).  It is common for your baby to suck about 2 3 minutes in order to start the flow of breast milk. Latching Teaching your baby how to latch on to your breast properly is very important. An improper latch can cause nipple pain and decreased milk supply for you and poor weight gain in your baby. Also, if your baby is not latched onto your nipple properly, he or she may swallow some air during feeding. This can make your baby fussy. Burping your baby when  you switch breasts during the feeding can help to get rid of the air. However, teaching your baby to latch on properly is still the best way to prevent fussiness from swallowing air while breastfeeding. Signs that your baby has successfully latched on to your nipple:    Silent tugging or silent sucking, without causing you pain.   Swallowing heard between every 3 4 sucks.      Muscle movement above and in front of his or her ears while sucking.  Signs that your baby has not successfully latched on to nipple:   Sucking sounds or smacking sounds from your baby while breastfeeding.  Nipple pain. If you think your baby has not latched on correctly, slip your finger into the corner of your baby's mouth to break the suction and place it between your baby's gums. Attempt breastfeeding initiation again. Signs of Successful Breastfeeding Signs from your baby:   A gradual decrease in the number of sucks or complete cessation of sucking.   Falling asleep.   Relaxation of his or her body.   Retention of a small amount of milk in his or her mouth.   Letting go of your breast by himself or herself. Signs from you:  Breasts that have increased in firmness, weight, and size 1 3 hours after feeding.   Breasts that are softer immediately after breastfeeding.  Increased milk volume, as well as a change in milk consistency and color by the 5th day of breastfeeding.   Nipples that are not sore, cracked, or bleeding. Signs That Your Baby is Getting Enough Milk  Wetting at least 3 diapers in a 24-hour period. The urine should be clear and pale yellow by age 5 days.  At least 3 stools in a 24-hour period by age 5 days. The stool should be soft and yellow.  At least 3 stools in a 24-hour period by age 7 days. The stool should be seedy and yellow.  No loss of weight greater than 10% of birth weight during the first 3 days of age.  Average weight gain of 4 7 ounces (120 210 mL) per week  after age 4 days.  Consistent daily weight gain by age 5 days, without weight loss after the age of 2 weeks. After a feeding, your baby may spit up a small amount. This is common. BREASTFEEDING FREQUENCY AND DURATION Frequent feeding will help you make more milk and can prevent sore nipples and breast engorgement. Breastfeed when you feel the need to reduce the fullness of your breasts or when your baby shows signs of hunger. This is called "breastfeeding on demand." Avoid introducing a pacifier to your baby while you are working to establish breastfeeding (the first 4 6 weeks after your baby is born). After this time you may choose to use a pacifier. Research has shown that pacifier use during the first year of a baby's life decreases the risk of sudden infant death syndrome (SIDS). Allow your baby to feed on each breast as long as he or she wants. Breastfeed until your baby is finished feeding. When your baby unlatches or falls asleep while feeding from the first breast, offer the second breast. Because newborns are often sleepy in the first few weeks of life, you may need to awaken your baby to get him or her to feed. Breastfeeding times will vary from baby to baby. However, the following rules can serve as a guide to help you ensure that your baby is properly fed:  Newborns (babies 4 weeks of age or younger) may breastfeed every 1 3 hours.  Newborns should not go longer than 3 hours during the day or 5 hours during the night without breastfeeding.  You should breastfeed your baby a minimum of 8 times in a 24-hour period until you begin to introduce solid foods to your baby at around 6 months of age. BREAST MILK PUMPING Pumping and storing breast milk   allows you to ensure that your baby is exclusively fed your breast milk, even at times when you are unable to breastfeed. This is especially important if you are going back to work while you are still breastfeeding or when you are not able to be  present during feedings. Your lactation consultant can give you guidelines on how long it is safe to store breast milk.  A breast pump is a machine that allows you to pump milk from your breast into a sterile bottle. The pumped breast milk can then be stored in a refrigerator or freezer. Some breast pumps are operated by hand, while others use electricity. Ask your lactation consultant which type will work best for you. Breast pumps can be purchased, but some hospitals and breastfeeding support groups lease breast pumps on a monthly basis. A lactation consultant can teach you how to hand express breast milk, if you prefer not to use a pump.  CARING FOR YOUR BREASTS WHILE YOU BREASTFEED Nipples can become dry, cracked, and sore while breastfeeding. The following recommendations can help keep your breasts moisturized and healthy:  Avoid using soap on your nipples.   Wear a supportive bra. Although not required, special nursing bras and tank tops are designed to allow access to your breasts for breastfeeding without taking off your entire bra or top. Avoid wearing underwire style bras or extremely tight bras.  Air dry your nipples for 3 4minutes after each feeding.   Use only cotton bra pads to absorb leaked breast milk. Leaking of breast milk between feedings is normal.   Use lanolin on your nipples after breastfeeding. Lanolin helps to maintain your skin's normal moisture barrier. If you use pure lanolin you do not need to wash it off before feeding your baby again. Pure lanolin is not toxic to your baby. You may also hand express a few drops of breast milk and gently massage that milk into your nipples and allow the milk to air dry. In the first few weeks after giving birth, some women experience extremely full breasts (engorgement). Engorgement can make your breasts feel heavy, warm, and tender to the touch. Engorgement peaks within 3 5 days after you give birth. The following recommendations can  help ease engorgement:  Completely empty your breasts while breastfeeding or pumping. You may want to start by applying warm, moist heat (in the shower or with warm water-soaked hand towels) just before feeding or pumping. This increases circulation and helps the milk flow. If your baby does not completely empty your breasts while breastfeeding, pump any extra milk after he or she is finished.  Wear a snug bra (nursing or regular) or tank top for 1 2 days to signal your body to slightly decrease milk production.  Apply ice packs to your breasts, unless this is too uncomfortable for you.  Make sure that your baby is latched on and positioned properly while breastfeeding. If engorgement persists after 48 hours of following these recommendations, contact your health care provider or a lactation consultant. OVERALL HEALTH CARE RECOMMENDATIONS WHILE BREASTFEEDING  Eat healthy foods. Alternate between meals and snacks, eating 3 of each per day. Because what you eat affects your breast milk, some of the foods may make your baby more irritable than usual. Avoid eating these foods if you are sure that they are negatively affecting your baby.  Drink milk, fruit juice, and water to satisfy your thirst (about 10 glasses a day).   Rest often, relax, and continue to take your   prenatal vitamins to prevent fatigue, stress, and anemia.  Continue breast self-awareness checks.  Avoid chewing and smoking tobacco.  Avoid alcohol and drug use. Some medicines that may be harmful to your baby can pass through breast milk. It is important to ask your health care provider before taking any medicine, including all over-the-counter and prescription medicine as well as vitamin and herbal supplements. It is possible to become pregnant while breastfeeding. If birth control is desired, ask your health care provider about options that will be safe for your baby. SEEK MEDICAL CARE IF:   You feel like you want to stop  breastfeeding or have become frustrated with breastfeeding.  You have painful breasts or nipples.  Your nipples are cracked or bleeding.  Your breasts are red, tender, or warm.  You have a swollen area on either breast.  You have a fever or chills.  You have nausea or vomiting.  You have drainage other than breast milk from your nipples.  Your breasts do not become full before feedings by the 5th day after you give birth.  You feel sad and depressed.  Your baby is too sleepy to eat well.  Your baby is having trouble sleeping.   Your baby is wetting less than 3 diapers in a 24-hour period.  Your baby has less than 3 stools in a 24-hour period.  Your baby's skin or the white part of his or her eyes becomes yellow.   Your baby is not gaining weight by 5 days of age. SEEK IMMEDIATE MEDICAL CARE IF:   Your baby is overly tired (lethargic) and does not want to wake up and feed.  Your baby develops an unexplained fever. Document Released: 06/21/2005 Document Revised: 02/21/2013 Document Reviewed: 12/13/2012 [employer] Patient Information 2014 [employer].  

## 2013-06-11 NOTE — Progress Notes (Signed)
12/4 NST reviewed and reactive. 

## 2013-06-11 NOTE — Progress Notes (Signed)
NST reviewed and reactive. FBS 108-142 2 hour after meals 121-256 Adjusted insulin.  She is on regular tid.  She did not want to switch back to short acting today.  All BS are out of range.  Pt. Admitted for glycemic control. D/c'd on 12/3.  Still without good control.  Willing to increase insulin.

## 2013-06-13 ENCOUNTER — Telehealth: Payer: Self-pay | Admitting: General Practice

## 2013-06-13 NOTE — Telephone Encounter (Signed)
Dr Penne Lash requesting full fetal echo report. Called [facility] (patient transferred care from here), no answer on prescriber line- left message that we are requesting a full report and to call us back.

## 2013-06-13 NOTE — Telephone Encounter (Signed)
Message copied by Kathee Delton on Wed Jun 13, 2013  3:54 PM ------      Message from: Lesly Dukes      Created: Mon Jun 11, 2013 11:22 AM       The pdf does not contain the whole report.  Can you re-request the report.                  ----- Message -----         From: Kathee Delton, RN         Sent: 06/04/2013  11:33 AM           To: Lesly Dukes, MD            Hey Dr Penne Lash,       It's scanned in under the media tab from 03/13/13. Let us know if this is not what you need.            Thanks,      Lyla Son, RN       ----- Message -----         From: Lesly Dukes, MD         Sent: 06/04/2013  10:41 AM           To: [facility]            We need result of echo from [facility].             ------

## 2013-06-13 NOTE — Discharge Summary (Signed)
Attestation of Attending Supervision of Advanced Practitioner (CNM/NP): Evaluation and management procedures were performed by the Advanced Practitioner under my supervision and collaboration. I have reviewed the Advanced Practitioner's note and chart, and I agree with the management and plan.  Iisha Soyars H. 9:40 AM   

## 2013-06-14 ENCOUNTER — Other Ambulatory Visit: Payer: Self-pay | Admitting: Family Medicine

## 2013-06-14 DIAGNOSIS — E1165 Type 2 diabetes mellitus with hyperglycemia: Secondary | ICD-10-CM

## 2013-06-14 NOTE — Telephone Encounter (Signed)
Called [facility] again today and left another message requesting full fetal echo report and a call back.

## 2013-06-15 ENCOUNTER — Ambulatory Visit (INDEPENDENT_AMBULATORY_CARE_PROVIDER_SITE_OTHER): Payer: BC Managed Care – PPO | Admitting: *Deleted

## 2013-06-15 VITALS — BP 120/80

## 2013-06-15 DIAGNOSIS — O24919 Unspecified diabetes mellitus in pregnancy, unspecified trimester: Secondary | ICD-10-CM

## 2013-06-15 DIAGNOSIS — O24913 Unspecified diabetes mellitus in pregnancy, third trimester: Secondary | ICD-10-CM

## 2013-06-15 NOTE — Progress Notes (Signed)
P = 82      [place] for growth @ [facility] on 12/15 @ 1030

## 2013-06-18 ENCOUNTER — Encounter: Payer: Self-pay | Admitting: Obstetrics & Gynecology

## 2013-06-18 ENCOUNTER — Ambulatory Visit (INDEPENDENT_AMBULATORY_CARE_PROVIDER_SITE_OTHER): Payer: Medicaid Other | Admitting: Family Medicine

## 2013-06-18 ENCOUNTER — Ambulatory Visit (HOSPITAL_COMMUNITY)
Admission: RE | Admit: 2013-06-18 | Discharge: 2013-06-18 | Disposition: A | Discharge status: Home / Self Care | Payer: Medicaid Other | Source: Ambulatory Visit | Attending: Obstetrics & Gynecology | Admitting: Obstetrics & Gynecology

## 2013-06-18 VITALS — BP 116/72 | Wt 307.0 lb

## 2013-06-18 VITALS — BP 111/60 | Wt 307.0 lb

## 2013-06-18 DIAGNOSIS — O24919 Unspecified diabetes mellitus in pregnancy, unspecified trimester: Secondary | ICD-10-CM

## 2013-06-18 DIAGNOSIS — O24913 Unspecified diabetes mellitus in pregnancy, third trimester: Secondary | ICD-10-CM

## 2013-06-18 DIAGNOSIS — E669 Obesity, unspecified: Secondary | ICD-10-CM | POA: Insufficient documentation

## 2013-06-18 DIAGNOSIS — R8271 Bacteriuria: Secondary | ICD-10-CM

## 2013-06-18 DIAGNOSIS — E1165 Type 2 diabetes mellitus with hyperglycemia: Secondary | ICD-10-CM

## 2013-06-18 DIAGNOSIS — O0990 Supervision of high risk pregnancy, unspecified, unspecified trimester: Secondary | ICD-10-CM

## 2013-06-18 NOTE — Progress Notes (Signed)
P = 87 

## 2013-06-18 NOTE — Patient Instructions (Signed)
Gestational Diabetes Mellitus Gestational diabetes mellitus, often simply referred to as gestational diabetes, is a type of diabetes that some women develop during pregnancy. In gestational diabetes, the pancreas does not make enough insulin (a hormone), the cells are less responsive to the insulin that is made (insulin resistance), or both.Normally, insulin moves sugars from food into the tissue cells. The tissue cells use the sugars for energy. The lack of insulin or the lack of normal response to insulin causes excess sugars to build up in the blood instead of going into the tissue cells. As a result, high blood sugar (hyperglycemia) develops. The effect of high sugar (glucose) levels can cause many complications.  RISK FACTORS You have an increased chance of developing gestational diabetes if you have a family history of diabetes and also have one or more of the following risk factors:  A body mass index over 30 (obesity).  A previous pregnancy with gestational diabetes.  An older age at the time of pregnancy. If blood glucose levels are kept in the normal range during pregnancy, women can have a healthy pregnancy. If your blood glucose levels are not well controlled, there may be risks to you, your unborn baby (fetus), your labor and delivery, or your newborn baby.  SYMPTOMS  If symptoms are experienced, they are much like symptoms you would normally expect during pregnancy. The symptoms of gestational diabetes include:   Increased thirst (polydipsia).  Increased urination (polyuria).  Increased urination during the night (nocturia).  Weight loss. This weight loss may be rapid.  Frequent, recurring infections.  Tiredness (fatigue).  Weakness.  Vision changes, such as blurred vision.  Fruity smell to your breath.  Abdominal pain. DIAGNOSIS Diabetes is diagnosed when blood glucose levels are increased. Your blood glucose level may be checked by one or more of the following  blood tests:  A fasting blood glucose test. You will not be allowed to eat for at least 8 hours before a blood sample is taken.  A random blood glucose test. Your blood glucose is checked at any time of the day regardless of when you ate.  A hemoglobin A1c blood glucose test. A hemoglobin A1c test provides information about blood glucose control over the previous 3 months.  An oral glucose tolerance test (OGTT). Your blood glucose is measured after you have not eaten (fasted) for 1 3 hours and then after you drink a glucose-containing beverage. Since the hormones that cause insulin resistance are highest at about 24 28 weeks of a pregnancy, an OGTT is usually performed during that time. If you have risk factors for gestational diabetes, your caregiver may test you for gestational diabetes earlier than 24 weeks of pregnancy. TREATMENT   You will need to take diabetes medicine or insulin daily to keep blood glucose levels in the desired range.  You will need to match insulin dosing with exercise and healthy food choices. The treatment goal is to maintain the before meal (preprandial), bedtime, and overnight blood glucose level at 60 99 mg/dL during pregnancy. The treatment goal is to further maintain peak after meal blood sugar (postprandial glucose) level at 100 140 mg/dL. HOME CARE INSTRUCTIONS   Have your hemoglobin A1c level checked twice a year.  Perform daily blood glucose monitoring as directed by your caregiver. It is common to perform frequent blood glucose monitoring.  Monitor urine ketones when you are ill and as directed by your caregiver.  Take your diabetes medicine and insulin as directed by your caregiver to maintain   your blood glucose level in the desired range.  Never run out of diabetes medicine or insulin. It is needed every day.  Adjust insulin based on your intake of carbohydrates. Carbohydrates can raise blood glucose levels but need to be included in your diet.  Carbohydrates provide vitamins, minerals, and fiber which are an essential part of a healthy diet. Carbohydrates are found in fruits, vegetables, whole grains, dairy products, legumes, and foods containing added sugars.    Eat healthy foods. Alternate 3 meals with 3 snacks.  Maintain a healthy weight gain. The usual total expected weight gain varies according to your prepregnancy body mass index (BMI).  Carry a medical alert card or wear your medical alert jewelry.  Carry a 15 gram carbohydrate snack with you at all times to treat low blood glucose (hypoglycemia). Some examples of 15 gram carbohydrate snacks include:  Glucose tablets, 3 or 4   Glucose gel, 15 gram tube  Raisins, 2 tablespoons (24 g)  Jelly beans, 6  Animal crackers, 8  Fruit juice, regular soda, or low fat milk, 4 ounces (120 mL)  Gummy treats, 9    Recognize hypoglycemia. Hypoglycemia during pregnancy occurs with blood glucose levels of 60 mg/dL and below. The risk for hypoglycemia increases when fasting or skipping meals, during or after intense exercise, and during sleep. Hypoglycemia symptoms can include:  Tremors or shakes.  Decreased ability to concentrate.  Sweating.  Increased heart rate.  Headache.  Dry mouth.  Hunger.  Irritability.  Anxiety.  Restless sleep.  Altered speech or coordination.  Confusion.  Treat hypoglycemia promptly. If you are alert and able to safely swallow, follow the 15:15 rule:  Take 15 20 grams of rapid-acting glucose or carbohydrate. Rapid-acting options include glucose gel, glucose tablets, or 4 ounces (120 mL) of fruit juice, regular soda, or low fat milk.  Check your blood glucose level 15 minutes after taking the glucose.   Take 15 20 grams more of glucose if the repeat blood glucose level is still 70 mg/dL or below.  Eat a meal or snack within 1 hour once blood glucose levels return to normal.  Be alert to polyuria and polydipsia which are early  signs of hyperglycemia. An early awareness of hyperglycemia allows for prompt treatment. Treat hyperglycemia as directed by your caregiver.  Engage in at least 30 minutes of physical activity a day or as directed by your caregiver. Ten minutes of physical activity timed 30 minutes after each meal is encouraged to control postprandial blood glucose levels.  Adjust your insulin dosing and food intake as needed if you start a new exercise or sport.  Follow your sick day plan at any time you are unable to eat or drink as usual.  Avoid tobacco and alcohol use.  Follow up with your caregiver regularly.  Follow the advice of your caregiver regarding your prenatal and post-delivery (postpartum) appointments, meal planning, exercise, medicines, vitamins, blood tests, other medical tests, and physical activities.  Perform daily skin and foot care. Examine your skin and feet daily for cuts, bruises, redness, nail problems, bleeding, blisters, or sores.  Brush your teeth and gums at least twice a day and floss at least once a day. Follow up with your dentist regularly.  Schedule an eye exam during the first trimester of your pregnancy or as directed by your caregiver.  Share your diabetes management plan with your workplace or school.  Stay up-to-date with immunizations.  Learn to manage stress.  Obtain ongoing diabetes education and   support as needed. SEEK MEDICAL CARE IF:   You are unable to eat food or drink fluids for more than 6 hours.  You have nausea and vomiting for more than 6 hours.  You have a blood glucose level of 200 mg/dL and you have ketones in your urine.  There is a change in mental status.  You develop vision problems.  You have a persistent headache.  You have upper abdominal pain or discomfort.  You develop an additional serious illness.  You have diarrhea for more than 6 hours.  You have been sick or have had a fever for a couple of days and are not getting  better. SEEK IMMEDIATE MEDICAL CARE IF:   You have difficulty breathing.  You no longer feel the baby moving.  You are bleeding or have discharge from your vagina.  You start having premature contractions or labor. MAKE SURE YOU:  Understand these instructions.  Will watch your condition.  Will get help right away if you are not doing well or get worse. Document Released: 09/27/2000 Document Revised: 10/16/2012 Document Reviewed: 01/18/2012 [employer] Patient Information 2014 [employer]  Third Trimester of Pregnancy The third trimester is from week 29 through week 42, months 7 through 9. The third trimester is a time when the fetus is growing rapidly. At the end of the ninth month, the fetus is about 20 inches in length and weighs 6 10 pounds.  BODY CHANGES Your body goes through many changes during pregnancy. The changes vary from woman to woman.   Your weight will continue to increase. You can expect to gain 25 35 pounds (11 16 kg) by the end of the pregnancy.  You may begin to get stretch marks on your hips, abdomen, and breasts.  You may urinate more often because the fetus is moving lower into your pelvis and pressing on your bladder.  You may develop or continue to have heartburn as a result of your pregnancy.  You may develop constipation because certain hormones are causing the muscles that push waste through your intestines to slow down.  You may develop hemorrhoids or swollen, bulging veins (varicose veins).  You may have pelvic pain because of the weight gain and pregnancy hormones relaxing your joints between the bones in your pelvis. Back aches may result from over exertion of the muscles supporting your posture.  Your breasts will continue to grow and be tender. A yellow discharge may leak from your breasts called colostrum.  Your belly button may stick out.  You may feel short of breath because of your expanding uterus.  You may notice the fetus  "dropping," or moving lower in your abdomen.  You may have a bloody mucus discharge. This usually occurs a few days to a week before labor begins.  Your cervix becomes thin and soft (effaced) near your due date. WHAT TO EXPECT AT YOUR PRENATAL EXAMS  You will have prenatal exams every 2 weeks until week 36. Then, you will have weekly prenatal exams. During a routine prenatal visit:  You will be weighed to make sure you and the fetus are growing normally.  Your blood pressure is taken.  Your abdomen will be measured to track your baby's growth.  The fetal heartbeat will be listened to.  Any test results from the previous visit will be discussed.  You may have a cervical check near your due date to see if you have effaced. At around 36 weeks, your caregiver will check your cervix. At   the same time, your caregiver will also perform a test on the secretions of the vaginal tissue. This test is to determine if a type of bacteria, Group B streptococcus, is present. Your caregiver will explain this further. Your caregiver may ask you:  What your birth plan is.  How you are feeling.  If you are feeling the baby move.  If you have had any abnormal symptoms, such as leaking fluid, bleeding, severe headaches, or abdominal cramping.  If you have any questions. Other tests or screenings that may be performed during your third trimester include:  Blood tests that check for low iron levels (anemia).  Fetal testing to check the health, activity level, and growth of the fetus. Testing is done if you have certain medical conditions or if there are problems during the pregnancy. FALSE LABOR You may feel small, irregular contractions that eventually go away. These are called Braxton Hicks contractions, or false labor. Contractions may last for hours, days, or even weeks before true labor sets in. If contractions come at regular intervals, intensify, or become painful, it is best to be seen by your  caregiver.  SIGNS OF LABOR   Menstrual-like cramps.  Contractions that are 5 minutes apart or less.  Contractions that start on the top of the uterus and spread down to the lower abdomen and back.  A sense of increased pelvic pressure or back pain.  A watery or bloody mucus discharge that comes from the vagina. If you have any of these signs before the 37th week of pregnancy, call your caregiver right away. You need to go to the hospital to get checked immediately. HOME CARE INSTRUCTIONS   Avoid all smoking, herbs, alcohol, and unprescribed drugs. These chemicals affect the formation and growth of the baby.  Follow your caregiver's instructions regarding medicine use. There are medicines that are either safe or unsafe to take during pregnancy.  Exercise only as directed by your caregiver. Experiencing uterine cramps is a good sign to stop exercising.  Continue to eat regular, healthy meals.  Wear a good support bra for breast tenderness.  Do not use hot tubs, steam rooms, or saunas.  Wear your seat belt at all times when driving.  Avoid raw meat, uncooked cheese, cat litter boxes, and soil used by cats. These carry germs that can cause birth defects in the baby.  Take your prenatal vitamins.  Try taking a stool softener (if your caregiver approves) if you develop constipation. Eat more high-fiber foods, such as fresh vegetables or fruit and whole grains. Drink plenty of fluids to keep your urine clear or pale yellow.  Take warm sitz baths to soothe any pain or discomfort caused by hemorrhoids. Use hemorrhoid cream if your caregiver approves.  If you develop varicose veins, wear support hose. Elevate your feet for 15 minutes, 3 4 times a day. Limit salt in your diet.  Avoid heavy lifting, wear low heal shoes, and practice good posture.  Rest a lot with your legs elevated if you have leg cramps or low back pain.  Visit your dentist if you have not gone during your pregnancy.  Use a soft toothbrush to brush your teeth and be gentle when you floss.  A sexual relationship may be continued unless your caregiver directs you otherwise.  Do not travel far distances unless it is absolutely necessary and only with the approval of your caregiver.  Take prenatal classes to understand, practice, and ask questions about the labor and delivery.  Make   a trial run to the hospital.  Pack your hospital bag.  Prepare the baby's nursery.  Continue to go to all your prenatal visits as directed by your caregiver. SEEK MEDICAL CARE IF:  You are unsure if you are in labor or if your water has broken.  You have dizziness.  You have mild pelvic cramps, pelvic pressure, or nagging pain in your abdominal area.  You have persistent nausea, vomiting, or diarrhea.  You have a bad smelling vaginal discharge.  You have pain with urination. SEEK IMMEDIATE MEDICAL CARE IF:   You have a fever.  You are leaking fluid from your vagina.  You have spotting or bleeding from your vagina.  You have severe abdominal cramping or pain.  You have rapid weight loss or gain.  You have shortness of breath with chest pain.  You notice sudden or extreme swelling of your face, hands, ankles, feet, or legs.  You have not felt your baby move in over an hour.  You have severe headaches that do not go away with medicine.  You have vision changes. Document Released: 06/15/2001 Document Revised: 02/21/2013 Document Reviewed: 08/22/2012 [employer] Patient Information 2014 [employer].  Breastfeeding Deciding to breastfeed is one of the best choices you can make for you and your baby. A change in hormones during pregnancy causes your breast tissue to grow and increases the number and size of your milk ducts. These hormones also allow proteins, sugars, and fats from your blood supply to make breast milk in your milk-producing glands. Hormones prevent breast milk from being released before your  baby is born as well as prompt milk flow after birth. Once breastfeeding has begun, thoughts of your baby, as well as his or her sucking or crying, can stimulate the release of milk from your milk-producing glands.  BENEFITS OF BREASTFEEDING For Your Baby  Your first milk (colostrum) helps your baby's digestive system function better.   There are antibodies in your milk that help your baby fight off infections.   Your baby has a lower incidence of asthma, allergies, and sudden infant death syndrome.   The nutrients in breast milk are better for your baby than infant formulas and are designed uniquely for your baby's needs.   Breast milk improves your baby's brain development.   Your baby is less likely to develop other conditions, such as childhood obesity, asthma, or type 2 diabetes mellitus.  For You   Breastfeeding helps to create a very special bond between you and your baby.   Breastfeeding is convenient. Breast milk is always available at the correct temperature and costs nothing.   Breastfeeding helps to burn calories and helps you lose the weight gained during pregnancy.   Breastfeeding makes your uterus contract to its prepregnancy size faster and slows bleeding (lochia) after you give birth.   Breastfeeding helps to lower your risk of developing type 2 diabetes mellitus, osteoporosis, and breast or ovarian cancer later in life. SIGNS THAT YOUR BABY IS HUNGRY Early Signs of Hunger  Increased alertness or activity.  Stretching.  Movement of the head from side to side.  Movement of the head and opening of the mouth when the corner of the mouth or cheek is stroked (rooting).  Increased sucking sounds, smacking lips, cooing, sighing, or squeaking.  Hand-to-mouth movements.  Increased sucking of fingers or hands. Late Signs of Hunger  Fussing.  Intermittent crying. Extreme Signs of Hunger Signs of extreme hunger will require calming and consoling before  your   baby will be able to breastfeed successfully. Do not wait for the following signs of extreme hunger to occur before you initiate breastfeeding:   Restlessness.  A loud, strong cry.   Screaming. BREASTFEEDING BASICS Breastfeeding Initiation  Find a comfortable place to sit or lie down, with your neck and back well supported.  Place a pillow or rolled up blanket under your baby to bring him or her to the level of your breast (if you are seated). Nursing pillows are specially designed to help support your arms and your baby while you breastfeed.  Make sure that your baby's abdomen is facing your abdomen.   Gently massage your breast. With your fingertips, massage from your chest wall toward your nipple in a circular motion. This encourages milk flow. You may need to continue this action during the feeding if your milk flows slowly.  Support your breast with 4 fingers underneath and your thumb above your nipple. Make sure your fingers are well away from your nipple and your baby's mouth.   Stroke your baby's lips gently with your finger or nipple.   When your baby's mouth is open wide enough, quickly bring your baby to your breast, placing your entire nipple and as much of the colored area around your nipple (areola) as possible into your baby's mouth.   More areola should be visible above your baby's upper lip than below the lower lip.   Your baby's tongue should be between his or her lower gum and your breast.   Ensure that your baby's mouth is correctly positioned around your nipple (latched). Your baby's lips should create a seal on your breast and be turned out (everted).  It is common for your baby to suck about 2 3 minutes in order to start the flow of breast milk. Latching Teaching your baby how to latch on to your breast properly is very important. An improper latch can cause nipple pain and decreased milk supply for you and poor weight gain in your baby. Also, if  your baby is not latched onto your nipple properly, he or she may swallow some air during feeding. This can make your baby fussy. Burping your baby when you switch breasts during the feeding can help to get rid of the air. However, teaching your baby to latch on properly is still the best way to prevent fussiness from swallowing air while breastfeeding. Signs that your baby has successfully latched on to your nipple:    Silent tugging or silent sucking, without causing you pain.   Swallowing heard between every 3 4 sucks.    Muscle movement above and in front of his or her ears while sucking.  Signs that your baby has not successfully latched on to nipple:   Sucking sounds or smacking sounds from your baby while breastfeeding.  Nipple pain. If you think your baby has not latched on correctly, slip your finger into the corner of your baby's mouth to break the suction and place it between your baby's gums. Attempt breastfeeding initiation again. Signs of Successful Breastfeeding Signs from your baby:   A gradual decrease in the number of sucks or complete cessation of sucking.   Falling asleep.   Relaxation of his or her body.   Retention of a small amount of milk in his or her mouth.   Letting go of your breast by himself or herself. Signs from you:  Breasts that have increased in firmness, weight, and size 1 3 hours after feeding.     Breasts that are softer immediately after breastfeeding.  Increased milk volume, as well as a change in milk consistency and color by the 5th day of breastfeeding.   Nipples that are not sore, cracked, or bleeding. Signs That Your Baby is Getting Enough Milk  Wetting at least 3 diapers in a 24-hour period. The urine should be clear and pale yellow by age 5 days.  At least 3 stools in a 24-hour period by age 5 days. The stool should be soft and yellow.  At least 3 stools in a 24-hour period by age 7 days. The stool should be seedy and  yellow.  No loss of weight greater than 10% of birth weight during the first 3 days of age.  Average weight gain of 4 7 ounces (120 210 mL) per week after age 4 days.  Consistent daily weight gain by age 5 days, without weight loss after the age of 2 weeks. After a feeding, your baby may spit up a small amount. This is common. BREASTFEEDING FREQUENCY AND DURATION Frequent feeding will help you make more milk and can prevent sore nipples and breast engorgement. Breastfeed when you feel the need to reduce the fullness of your breasts or when your baby shows signs of hunger. This is called "breastfeeding on demand." Avoid introducing a pacifier to your baby while you are working to establish breastfeeding (the first 4 6 weeks after your baby is born). After this time you may choose to use a pacifier. Research has shown that pacifier use during the first year of a baby's life decreases the risk of sudden infant death syndrome (SIDS). Allow your baby to feed on each breast as long as he or she wants. Breastfeed until your baby is finished feeding. When your baby unlatches or falls asleep while feeding from the first breast, offer the second breast. Because newborns are often sleepy in the first few weeks of life, you may need to awaken your baby to get him or her to feed. Breastfeeding times will vary from baby to baby. However, the following rules can serve as a guide to help you ensure that your baby is properly fed:  Newborns (babies 4 weeks of age or younger) may breastfeed every 1 3 hours.  Newborns should not go longer than 3 hours during the day or 5 hours during the night without breastfeeding.  You should breastfeed your baby a minimum of 8 times in a 24-hour period until you begin to introduce solid foods to your baby at around 6 months of age. BREAST MILK PUMPING Pumping and storing breast milk allows you to ensure that your baby is exclusively fed your breast milk, even at times when you  are unable to breastfeed. This is especially important if you are going back to work while you are still breastfeeding or when you are not able to be present during feedings. Your lactation consultant can give you guidelines on how long it is safe to store breast milk.  A breast pump is a machine that allows you to pump milk from your breast into a sterile bottle. The pumped breast milk can then be stored in a refrigerator or freezer. Some breast pumps are operated by hand, while others use electricity. Ask your lactation consultant which type will work best for you. Breast pumps can be purchased, but some hospitals and breastfeeding support groups lease breast pumps on a monthly basis. A lactation consultant can teach you how to hand express breast milk, if you   prefer not to use a pump.  CARING FOR YOUR BREASTS WHILE YOU BREASTFEED Nipples can become dry, cracked, and sore while breastfeeding. The following recommendations can help keep your breasts moisturized and healthy:  Avoid using soap on your nipples.   Wear a supportive bra. Although not required, special nursing bras and tank tops are designed to allow access to your breasts for breastfeeding without taking off your entire bra or top. Avoid wearing underwire style bras or extremely tight bras.  Air dry your nipples for 3 4minutes after each feeding.   Use only cotton bra pads to absorb leaked breast milk. Leaking of breast milk between feedings is normal.   Use lanolin on your nipples after breastfeeding. Lanolin helps to maintain your skin's normal moisture barrier. If you use pure lanolin you do not need to wash it off before feeding your baby again. Pure lanolin is not toxic to your baby. You may also hand express a few drops of breast milk and gently massage that milk into your nipples and allow the milk to air dry. In the first few weeks after giving birth, some women experience extremely full breasts (engorgement). Engorgement can  make your breasts feel heavy, warm, and tender to the touch. Engorgement peaks within 3 5 days after you give birth. The following recommendations can help ease engorgement:  Completely empty your breasts while breastfeeding or pumping. You may want to start by applying warm, moist heat (in the shower or with warm water-soaked hand towels) just before feeding or pumping. This increases circulation and helps the milk flow. If your baby does not completely empty your breasts while breastfeeding, pump any extra milk after he or she is finished.  Wear a snug bra (nursing or regular) or tank top for 1 2 days to signal your body to slightly decrease milk production.  Apply ice packs to your breasts, unless this is too uncomfortable for you.  Make sure that your baby is latched on and positioned properly while breastfeeding. If engorgement persists after 48 hours of following these recommendations, contact your health care provider or a lactation consultant. OVERALL HEALTH CARE RECOMMENDATIONS WHILE BREASTFEEDING  Eat healthy foods. Alternate between meals and snacks, eating 3 of each per day. Because what you eat affects your breast milk, some of the foods may make your baby more irritable than usual. Avoid eating these foods if you are sure that they are negatively affecting your baby.  Drink milk, fruit juice, and water to satisfy your thirst (about 10 glasses a day).   Rest often, relax, and continue to take your prenatal vitamins to prevent fatigue, stress, and anemia.  Continue breast self-awareness checks.  Avoid chewing and smoking tobacco.  Avoid alcohol and drug use. Some medicines that may be harmful to your baby can pass through breast milk. It is important to ask your health care provider before taking any medicine, including all over-the-counter and prescription medicine as well as vitamin and herbal supplements. It is possible to become pregnant while breastfeeding. If birth control  is desired, ask your health care provider about options that will be safe for your baby. SEEK MEDICAL CARE IF:   You feel like you want to stop breastfeeding or have become frustrated with breastfeeding.  You have painful breasts or nipples.  Your nipples are cracked or bleeding.  Your breasts are red, tender, or warm.  You have a swollen area on either breast.  You have a fever or chills.  You have nausea   or vomiting.  You have drainage other than breast milk from your nipples.  Your breasts do not become full before feedings by the 5th day after you give birth.  You feel sad and depressed.  Your baby is too sleepy to eat well.  Your baby is having trouble sleeping.   Your baby is wetting less than 3 diapers in a 24-hour period.  Your baby has less than 3 stools in a 24-hour period.  Your baby's skin or the white part of his or her eyes becomes yellow.   Your baby is not gaining weight by 5 days of age. SEEK IMMEDIATE MEDICAL CARE IF:   Your baby is overly tired (lethargic) and does not want to wake up and feed.  Your baby develops an unexplained fever. Document Released: 06/21/2005 Document Revised: 02/21/2013 Document Reviewed: 12/13/2012 [employer] Patient Information 2014 [employer].  

## 2013-06-18 NOTE — Progress Notes (Signed)
NST reviewed and reactive. Reports BS seem better--no book--she reports that they have improved with insulin changes made last week.

## 2013-06-18 NOTE — Telephone Encounter (Signed)
Called [facility], obtained report- same as what is in media tab already

## 2013-06-19 ENCOUNTER — Encounter: Payer: Self-pay | Admitting: Family Medicine

## 2013-06-20 NOTE — Progress Notes (Signed)
NST reviewed and reactive.  

## 2013-06-22 ENCOUNTER — Ambulatory Visit (INDEPENDENT_AMBULATORY_CARE_PROVIDER_SITE_OTHER): Payer: Medicaid Other | Admitting: *Deleted

## 2013-06-22 VITALS — BP 117/72

## 2013-06-22 DIAGNOSIS — O24913 Unspecified diabetes mellitus in pregnancy, third trimester: Secondary | ICD-10-CM

## 2013-06-22 DIAGNOSIS — O24919 Unspecified diabetes mellitus in pregnancy, unspecified trimester: Secondary | ICD-10-CM

## 2013-06-22 NOTE — Progress Notes (Signed)
12/19 NST reviewed and reactive 

## 2013-06-22 NOTE — Progress Notes (Signed)
P-89 

## 2013-06-25 ENCOUNTER — Encounter: Payer: Self-pay | Admitting: Obstetrics and Gynecology

## 2013-06-25 ENCOUNTER — Ambulatory Visit (INDEPENDENT_AMBULATORY_CARE_PROVIDER_SITE_OTHER): Payer: Medicaid Other | Admitting: Obstetrics and Gynecology

## 2013-06-25 VITALS — BP 128/72 | Wt 312.9 lb

## 2013-06-25 DIAGNOSIS — O24919 Unspecified diabetes mellitus in pregnancy, unspecified trimester: Secondary | ICD-10-CM

## 2013-06-25 DIAGNOSIS — E1165 Type 2 diabetes mellitus with hyperglycemia: Secondary | ICD-10-CM

## 2013-06-25 DIAGNOSIS — O0990 Supervision of high risk pregnancy, unspecified, unspecified trimester: Secondary | ICD-10-CM

## 2013-06-25 DIAGNOSIS — O99213 Obesity complicating pregnancy, third trimester: Secondary | ICD-10-CM

## 2013-06-25 DIAGNOSIS — E669 Obesity, unspecified: Secondary | ICD-10-CM

## 2013-06-25 DIAGNOSIS — O24913 Unspecified diabetes mellitus in pregnancy, third trimester: Secondary | ICD-10-CM

## 2013-06-25 DIAGNOSIS — E249 Cushing's syndrome, unspecified: Secondary | ICD-10-CM

## 2013-06-25 LAB — POCT URINALYSIS DIP (DEVICE)
Bilirubin Urine: NEGATIVE
Glucose, UA: 500 mg/dL — AB
Ketones, ur: NEGATIVE mg/dL
Protein, ur: 30 mg/dL — AB
Specific Gravity, Urine: 1.02 (ref 1.005–1.030)
Urobilinogen, UA: 2 mg/dL — ABNORMAL HIGH (ref 0.0–1.0)

## 2013-06-25 NOTE — Progress Notes (Signed)
Patient doing well without complaints. FM/labor precautions reviewed. Patient is not documenting in log book. Meter reviewed and all out of range with highest postprandial 221. Will increase NPH 25 in am and 28 in pm. Diet and portion control reviewed with patient F/U growth ultrasound scheduled NST reviewed and reactive

## 2013-06-25 NOTE — Progress Notes (Signed)
P-83 

## 2013-06-27 ENCOUNTER — Ambulatory Visit (INDEPENDENT_AMBULATORY_CARE_PROVIDER_SITE_OTHER): Payer: Medicaid Other | Admitting: *Deleted

## 2013-06-27 VITALS — BP 122/61

## 2013-06-27 DIAGNOSIS — O24919 Unspecified diabetes mellitus in pregnancy, unspecified trimester: Secondary | ICD-10-CM

## 2013-06-27 DIAGNOSIS — O24913 Unspecified diabetes mellitus in pregnancy, third trimester: Secondary | ICD-10-CM

## 2013-06-27 NOTE — Progress Notes (Signed)
P = 87 

## 2013-06-27 NOTE — Progress Notes (Signed)
NST reviewed and reactive.  Navi Erber L. Harraway-Smith, M.D., FACOG    

## 2013-07-02 ENCOUNTER — Other Ambulatory Visit: Payer: Self-pay | Admitting: Family Medicine

## 2013-07-02 ENCOUNTER — Encounter: Payer: Self-pay | Admitting: Obstetrics and Gynecology

## 2013-07-02 ENCOUNTER — Ambulatory Visit (INDEPENDENT_AMBULATORY_CARE_PROVIDER_SITE_OTHER): Payer: Medicaid Other | Admitting: Obstetrics and Gynecology

## 2013-07-02 ENCOUNTER — Ambulatory Visit (HOSPITAL_COMMUNITY)
Admission: RE | Admit: 2013-07-02 | Discharge: 2013-07-02 | Disposition: A | Discharge status: Home / Self Care | Payer: Medicaid Other | Source: Ambulatory Visit | Attending: Family Medicine | Admitting: Family Medicine

## 2013-07-02 VITALS — BP 113/78 | Temp 97.0°F | Wt 313.0 lb

## 2013-07-02 DIAGNOSIS — O24919 Unspecified diabetes mellitus in pregnancy, unspecified trimester: Secondary | ICD-10-CM

## 2013-07-02 DIAGNOSIS — E669 Obesity, unspecified: Secondary | ICD-10-CM | POA: Insufficient documentation

## 2013-07-02 DIAGNOSIS — O0990 Supervision of high risk pregnancy, unspecified, unspecified trimester: Secondary | ICD-10-CM

## 2013-07-02 DIAGNOSIS — O99213 Obesity complicating pregnancy, third trimester: Secondary | ICD-10-CM

## 2013-07-02 DIAGNOSIS — O24913 Unspecified diabetes mellitus in pregnancy, third trimester: Secondary | ICD-10-CM

## 2013-07-02 DIAGNOSIS — R8271 Bacteriuria: Secondary | ICD-10-CM

## 2013-07-02 LAB — OB RESULTS CONSOLE GC/CHLAMYDIA
Chlamydia: NEGATIVE
Gonorrhea: NEGATIVE

## 2013-07-02 LAB — OB RESULTS CONSOLE GBS: STREP GROUP B AG: NEGATIVE

## 2013-07-02 NOTE — Progress Notes (Signed)
Patient doing well without complaints. Cultures collected today. CBGs reviewed fasting most within range. 2hr pp vary from 119 to 220. Patient admits to not always following the diet. Reviewed importance of glycemic control at this stage of pregnancy. Will schedule IOL on 1/14 at 39 weeks

## 2013-07-02 NOTE — Progress Notes (Signed)
IOL scheduled 07/18/13 at 730 pm.

## 2013-07-02 NOTE — Progress Notes (Signed)
Pulse: 79

## 2013-07-03 LAB — GC/CHLAMYDIA PROBE AMP
CT Probe RNA: NEGATIVE
GC Probe RNA: NEGATIVE

## 2013-07-04 ENCOUNTER — Ambulatory Visit (INDEPENDENT_AMBULATORY_CARE_PROVIDER_SITE_OTHER): Payer: Medicaid Other | Admitting: *Deleted

## 2013-07-04 VITALS — BP 125/79 | Temp 98.0°F | Wt 316.0 lb

## 2013-07-04 DIAGNOSIS — O24919 Unspecified diabetes mellitus in pregnancy, unspecified trimester: Secondary | ICD-10-CM

## 2013-07-04 DIAGNOSIS — E1165 Type 2 diabetes mellitus with hyperglycemia: Secondary | ICD-10-CM

## 2013-07-04 DIAGNOSIS — O24913 Unspecified diabetes mellitus in pregnancy, third trimester: Secondary | ICD-10-CM

## 2013-07-04 LAB — CULTURE, BETA STREP (GROUP B ONLY)

## 2013-07-04 NOTE — Progress Notes (Signed)
Reactive NST on 07/04/13

## 2013-07-04 NOTE — Progress Notes (Signed)
P=88 NST today, scheduled for IOL on 07/18/2013.

## 2013-07-05 NOTE — L&D Delivery Note (Signed)
Attestation of Attending Supervision of Advanced Practitioner (CNM/NP): Evaluation and management procedures were performed by the Advanced Practitioner under my supervision and collaboration.  I have reviewed the Advanced Practitioner's note and chart, and I agree with the management and plan.  Hulda Reddix 07/24/2013 9:21 AM

## 2013-07-05 NOTE — L&D Delivery Note (Signed)
Delivery Note Pt pushed between 2.5-3hrs and at 9:42 PM a viable female was delivered via Vaginal, Spontaneous Delivery (Presentation: Occiput Anterior).  APGAR: 2, 3, 4; weight 9 lb 9.1 oz (4340 g).  No difficulty with delivery of shoulders; loose nuchal cord x 1 easily reduced prior to del. Infant not responsive; cord quickly clamped and cut and infant to warmer and awaiting peds team.  Placenta status: Intact, Spontaneous.  Cord: 3 vessels with the following complications: Cord pH: 7.124  Anesthesia: Epidural  Episiotomy: Midline- 2nd deg Lacerations: None Suture Repair: 3.0 Monocryl Est. Blood Loss (mL): 350  Mom to postpartum.  Baby to NICU.  Selena LesserBraimah, Tina 07/20/2013, 10:42 PM  I have seen and examined this patient and I agree with the above. SHAW, KIMBERLY 3:06 AM 07/21/2013

## 2013-07-06 ENCOUNTER — Encounter: Payer: Self-pay | Admitting: Obstetrics and Gynecology

## 2013-07-08 ENCOUNTER — Encounter (HOSPITAL_COMMUNITY): Payer: Self-pay

## 2013-07-08 ENCOUNTER — Inpatient Hospital Stay (HOSPITAL_COMMUNITY)
Admission: AD | Admit: 2013-07-08 | Discharge: 2013-07-08 | Disposition: A | Discharge status: Home / Self Care | Payer: Medicaid Other | Source: Ambulatory Visit | Attending: Family Medicine | Admitting: Family Medicine

## 2013-07-08 DIAGNOSIS — R8271 Bacteriuria: Secondary | ICD-10-CM

## 2013-07-08 DIAGNOSIS — O0990 Supervision of high risk pregnancy, unspecified, unspecified trimester: Secondary | ICD-10-CM

## 2013-07-08 DIAGNOSIS — O24913 Unspecified diabetes mellitus in pregnancy, third trimester: Secondary | ICD-10-CM

## 2013-07-08 DIAGNOSIS — O99891 Other specified diseases and conditions complicating pregnancy: Secondary | ICD-10-CM | POA: Insufficient documentation

## 2013-07-08 DIAGNOSIS — O9989 Other specified diseases and conditions complicating pregnancy, childbirth and the puerperium: Principal | ICD-10-CM

## 2013-07-08 DIAGNOSIS — N949 Unspecified condition associated with female genital organs and menstrual cycle: Secondary | ICD-10-CM | POA: Insufficient documentation

## 2013-07-08 LAB — URINALYSIS, ROUTINE W REFLEX MICROSCOPIC
BILIRUBIN URINE: NEGATIVE
Glucose, UA: NEGATIVE mg/dL
Hgb urine dipstick: NEGATIVE
Ketones, ur: 15 mg/dL — AB
Leukocytes, UA: NEGATIVE
NITRITE: NEGATIVE
Protein, ur: NEGATIVE mg/dL
SPECIFIC GRAVITY, URINE: 1.025 (ref 1.005–1.030)
UROBILINOGEN UA: 4 mg/dL — AB (ref 0.0–1.0)
pH: 6 (ref 5.0–8.0)

## 2013-07-08 LAB — WET PREP, GENITAL
CLUE CELLS WET PREP: NONE SEEN
Trich, Wet Prep: NONE SEEN
Yeast Wet Prep HPF POC: NONE SEEN

## 2013-07-08 NOTE — MAU Note (Signed)
Vaginal pain x few days, got worse tonight.  Denies bleeding.  Reports tightnings, started at 8 pm.  Reports good FM.

## 2013-07-08 NOTE — Discharge Instructions (Signed)

## 2013-07-08 NOTE — MAU Note (Signed)
I'm having a lot of vaginal pain and tonight my stomach started tightening and is crampy

## 2013-07-09 ENCOUNTER — Ambulatory Visit (INDEPENDENT_AMBULATORY_CARE_PROVIDER_SITE_OTHER): Payer: Medicaid Other | Admitting: Obstetrics & Gynecology

## 2013-07-09 VITALS — BP 120/74 | Temp 97.0°F | Wt 320.2 lb

## 2013-07-09 DIAGNOSIS — O24913 Unspecified diabetes mellitus in pregnancy, third trimester: Secondary | ICD-10-CM

## 2013-07-09 DIAGNOSIS — O24919 Unspecified diabetes mellitus in pregnancy, unspecified trimester: Secondary | ICD-10-CM

## 2013-07-09 DIAGNOSIS — O0990 Supervision of high risk pregnancy, unspecified, unspecified trimester: Secondary | ICD-10-CM

## 2013-07-09 NOTE — Progress Notes (Signed)
P= 76 Edema in feet.  Intermittent moderate lower abdominal/pelvic pain and pressure, "hurts to lay down,"  braxton hicks.

## 2013-07-09 NOTE — Progress Notes (Signed)
Patient did not bring log book, reports normal range fasting and a few abnormal postprandials.  Will bring it in later this week.   06/18/13 EFW 87%, next scan scheduled on 07/13/13.  Normal NST yesterday when she was seen in MAU for labor check, not needed today.  No other complaints or concerns.  Fetal movement and labor precautions reviewed.

## 2013-07-09 NOTE — Patient Instructions (Signed)
Return to clinic for any obstetric concerns or go to MAU for evaluation  

## 2013-07-09 NOTE — Progress Notes (Signed)
Pt seen @ MAU yesterday for vaginal pain- had reactive FHR tracing, NST not needed today.   IOL scheduled on 07/18/13.

## 2013-07-13 ENCOUNTER — Telehealth (HOSPITAL_COMMUNITY): Payer: Self-pay | Admitting: *Deleted

## 2013-07-13 ENCOUNTER — Encounter: Payer: Self-pay | Admitting: *Deleted

## 2013-07-13 ENCOUNTER — Ambulatory Visit (HOSPITAL_COMMUNITY)
Admission: RE | Admit: 2013-07-13 | Discharge: 2013-07-13 | Disposition: A | Discharge status: Home / Self Care | Payer: Medicaid Other | Source: Ambulatory Visit | Attending: Obstetrics and Gynecology | Admitting: Obstetrics and Gynecology

## 2013-07-13 ENCOUNTER — Ambulatory Visit (INDEPENDENT_AMBULATORY_CARE_PROVIDER_SITE_OTHER): Payer: Medicaid Other | Admitting: *Deleted

## 2013-07-13 VITALS — BP 120/69

## 2013-07-13 DIAGNOSIS — O24913 Unspecified diabetes mellitus in pregnancy, third trimester: Secondary | ICD-10-CM

## 2013-07-13 DIAGNOSIS — O24919 Unspecified diabetes mellitus in pregnancy, unspecified trimester: Secondary | ICD-10-CM

## 2013-07-13 DIAGNOSIS — E669 Obesity, unspecified: Secondary | ICD-10-CM | POA: Insufficient documentation

## 2013-07-13 DIAGNOSIS — O99210 Obesity complicating pregnancy, unspecified trimester: Secondary | ICD-10-CM

## 2013-07-13 MED ORDER — INSULIN REGULAR HUMAN 100 UNIT/ML IJ SOLN: INTRAMUSCULAR | Status: DC

## 2013-07-13 NOTE — Progress Notes (Signed)
NST 07/13/13 reactive 

## 2013-07-13 NOTE — Progress Notes (Signed)
P = 81     US for growth done today.  Pt brought CBG log book- reviewed by Dr. Marice Potterove- increase regular insulin @ breakfast to 12 units SQ.

## 2013-07-13 NOTE — Telephone Encounter (Signed)
Preadmission screen  

## 2013-07-16 ENCOUNTER — Encounter: Payer: Self-pay | Admitting: Obstetrics and Gynecology

## 2013-07-16 ENCOUNTER — Ambulatory Visit (INDEPENDENT_AMBULATORY_CARE_PROVIDER_SITE_OTHER): Payer: Medicaid Other | Admitting: Obstetrics and Gynecology

## 2013-07-16 VITALS — BP 111/63 | Temp 98.8°F | Wt 324.0 lb

## 2013-07-16 DIAGNOSIS — IMO0001 Reserved for inherently not codable concepts without codable children: Secondary | ICD-10-CM

## 2013-07-16 DIAGNOSIS — O99210 Obesity complicating pregnancy, unspecified trimester: Secondary | ICD-10-CM

## 2013-07-16 DIAGNOSIS — O24913 Unspecified diabetes mellitus in pregnancy, third trimester: Secondary | ICD-10-CM

## 2013-07-16 DIAGNOSIS — O99213 Obesity complicating pregnancy, third trimester: Secondary | ICD-10-CM

## 2013-07-16 DIAGNOSIS — O0990 Supervision of high risk pregnancy, unspecified, unspecified trimester: Secondary | ICD-10-CM

## 2013-07-16 DIAGNOSIS — E1165 Type 2 diabetes mellitus with hyperglycemia: Secondary | ICD-10-CM

## 2013-07-16 DIAGNOSIS — E669 Obesity, unspecified: Secondary | ICD-10-CM

## 2013-07-16 DIAGNOSIS — IMO0002 Reserved for concepts with insufficient information to code with codable children: Secondary | ICD-10-CM

## 2013-07-16 DIAGNOSIS — O24919 Unspecified diabetes mellitus in pregnancy, unspecified trimester: Secondary | ICD-10-CM

## 2013-07-16 LAB — POCT URINALYSIS DIP (DEVICE)
Bilirubin Urine: NEGATIVE
Glucose, UA: NEGATIVE mg/dL
HGB URINE DIPSTICK: NEGATIVE
Ketones, ur: NEGATIVE mg/dL
Nitrite: NEGATIVE
PROTEIN: NEGATIVE mg/dL
Specific Gravity, Urine: 1.015 (ref 1.005–1.030)
UROBILINOGEN UA: 0.2 mg/dL (ref 0.0–1.0)
pH: 7 (ref 5.0–8.0)

## 2013-07-16 NOTE — Progress Notes (Signed)
US for growth done on 07/13/13.

## 2013-07-16 NOTE — Progress Notes (Signed)
Patient doing well without complaints. FM/labor precautions reviewed. CBG majority within range since AM NPH has been increased on 07/13/2013. EFW- 3820 gm (>90%tile) at 38 weeks. Patient scheduled for IOL at 39 weeks on 07/18/2013. Discussed risks of shoulder dystocia given AC >97%tile. Also discussed low threshold for cesarean section in the presence of protracted active phase of labor. Patient verbalized understanding and all questions were answered.  NST reviewed and reactive

## 2013-07-16 NOTE — Progress Notes (Signed)
P=86  Pt here today for NST/OB follow up.  States she is having abd/vaginal pressure at all times., denies any contractions.  IOL scheduled on Wed @ 1930.

## 2013-07-18 ENCOUNTER — Inpatient Hospital Stay (HOSPITAL_COMMUNITY)
Admission: RE | Admit: 2013-07-18 | Discharge: 2013-07-22 | DRG: 775 | Disposition: A | Discharge status: Home / Self Care | Payer: Medicaid Other | Source: Ambulatory Visit | Attending: Obstetrics & Gynecology | Admitting: Obstetrics & Gynecology

## 2013-07-18 ENCOUNTER — Encounter (HOSPITAL_COMMUNITY): Payer: Self-pay

## 2013-07-18 DIAGNOSIS — O99891 Other specified diseases and conditions complicating pregnancy: Secondary | ICD-10-CM

## 2013-07-18 DIAGNOSIS — O99214 Obesity complicating childbirth: Secondary | ICD-10-CM

## 2013-07-18 DIAGNOSIS — O9989 Other specified diseases and conditions complicating pregnancy, childbirth and the puerperium: Secondary | ICD-10-CM

## 2013-07-18 DIAGNOSIS — R8271 Bacteriuria: Secondary | ICD-10-CM

## 2013-07-18 DIAGNOSIS — E282 Polycystic ovarian syndrome: Secondary | ICD-10-CM | POA: Diagnosis present

## 2013-07-18 DIAGNOSIS — O99814 Abnormal glucose complicating childbirth: Principal | ICD-10-CM | POA: Diagnosis present

## 2013-07-18 DIAGNOSIS — E669 Obesity, unspecified: Secondary | ICD-10-CM | POA: Diagnosis present

## 2013-07-18 DIAGNOSIS — O0990 Supervision of high risk pregnancy, unspecified, unspecified trimester: Secondary | ICD-10-CM

## 2013-07-18 DIAGNOSIS — Z794 Long term (current) use of insulin: Secondary | ICD-10-CM

## 2013-07-18 DIAGNOSIS — O34599 Maternal care for other abnormalities of gravid uterus, unspecified trimester: Secondary | ICD-10-CM | POA: Diagnosis present

## 2013-07-18 DIAGNOSIS — O24919 Unspecified diabetes mellitus in pregnancy, unspecified trimester: Secondary | ICD-10-CM

## 2013-07-18 LAB — CBC
HEMATOCRIT: 32.8 % — AB (ref 36.0–46.0)
Hemoglobin: 10.9 g/dL — ABNORMAL LOW (ref 12.0–15.0)
MCH: 31.5 pg (ref 26.0–34.0)
MCHC: 33.2 g/dL (ref 30.0–36.0)
MCV: 94.8 fL (ref 78.0–100.0)
PLATELETS: 160 10*3/uL (ref 150–400)
RBC: 3.46 MIL/uL — ABNORMAL LOW (ref 3.87–5.11)
RDW: 13.8 % (ref 11.5–15.5)
WBC: 6.4 10*3/uL (ref 4.0–10.5)

## 2013-07-18 LAB — TYPE AND SCREEN
ABO/RH(D): O POS
ANTIBODY SCREEN: NEGATIVE

## 2013-07-18 LAB — GLUCOSE, CAPILLARY: Glucose-Capillary: 155 mg/dL — ABNORMAL HIGH (ref 70–99)

## 2013-07-18 MED ORDER — OXYTOCIN 40 UNITS IN LACTATED RINGERS INFUSION - SIMPLE MED: 62.5000 mL/h | INTRAVENOUS | Status: DC

## 2013-07-18 MED ORDER — LACTATED RINGERS IV SOLN
INTRAVENOUS | Status: DC
  Administered 2013-07-18: 22:00:00 via INTRAVENOUS
  Administered 2013-07-19: 125 mL/h via INTRAVENOUS
  Administered 2013-07-19 – 2013-07-20 (×2): via INTRAVENOUS

## 2013-07-18 MED ORDER — INSULIN ASPART 100 UNIT/ML ~~LOC~~ SOLN: 16.0000 [IU] | SUBCUTANEOUS | Status: DC

## 2013-07-18 MED ORDER — LACTATED RINGERS IV SOLN
500.0000 mL | INTRAVENOUS | Status: DC | PRN
  Administered 2013-07-19: 500 mL via INTRAVENOUS

## 2013-07-18 MED ORDER — INSULIN REGULAR HUMAN 100 UNIT/ML IJ SOLN: 12.0000 [IU] | Freq: Three times a day (TID) | INTRAMUSCULAR | Status: DC

## 2013-07-18 MED ORDER — OXYTOCIN BOLUS FROM INFUSION: 500.0000 mL | INTRAVENOUS | Status: DC

## 2013-07-18 MED ORDER — ONDANSETRON HCL 4 MG/2ML IJ SOLN
4.0000 mg | Freq: Four times a day (QID) | INTRAMUSCULAR | Status: DC | PRN
  Administered 2013-07-20: 4 mg via INTRAVENOUS
  Filled 2013-07-18: qty 2

## 2013-07-18 MED ORDER — ACETAMINOPHEN 325 MG PO TABS: 650.0000 mg | ORAL_TABLET | ORAL | Status: DC | PRN

## 2013-07-18 MED ORDER — IBUPROFEN 600 MG PO TABS
600.0000 mg | ORAL_TABLET | Freq: Four times a day (QID) | ORAL | Status: DC | PRN
  Administered 2013-07-21: 600 mg via ORAL
  Filled 2013-07-18: qty 1

## 2013-07-18 MED ORDER — INSULIN REGULAR HUMAN 100 UNIT/ML IJ SOLN: 16.0000 [IU] | Freq: Two times a day (BID) | INTRAMUSCULAR | Status: DC

## 2013-07-18 MED ORDER — TERBUTALINE SULFATE 1 MG/ML IJ SOLN: 0.2500 mg | Freq: Once | INTRAMUSCULAR | Status: AC | PRN

## 2013-07-18 MED ORDER — CITRIC ACID-SODIUM CITRATE 334-500 MG/5ML PO SOLN
30.0000 mL | ORAL | Status: DC | PRN
  Administered 2013-07-20: 30 mL via ORAL
  Filled 2013-07-18: qty 15

## 2013-07-18 MED ORDER — MISOPROSTOL 25 MCG QUARTER TABLET
25.0000 ug | ORAL_TABLET | ORAL | Status: DC
  Administered 2013-07-18 – 2013-07-19 (×2): 25 ug via ORAL
  Filled 2013-07-18 (×3): qty 0.25

## 2013-07-18 MED ORDER — INSULIN ASPART 100 UNIT/ML ~~LOC~~ SOLN: 12.0000 [IU] | Freq: Every day | SUBCUTANEOUS | Status: DC

## 2013-07-18 MED ORDER — LIDOCAINE HCL (PF) 1 % IJ SOLN
30.0000 mL | INTRAMUSCULAR | Status: DC | PRN
  Filled 2013-07-18: qty 30

## 2013-07-18 MED ORDER — INSULIN NPH (HUMAN) (ISOPHANE) 100 UNIT/ML ~~LOC~~ SUSP
25.0000 [IU] | Freq: Every day | SUBCUTANEOUS | Status: DC
  Filled 2013-07-18: qty 10

## 2013-07-18 MED ORDER — ZOLPIDEM TARTRATE 5 MG PO TABS: 5.0000 mg | ORAL_TABLET | Freq: Every evening | ORAL | Status: DC | PRN

## 2013-07-18 MED ORDER — INSULIN NPH (HUMAN) (ISOPHANE) 100 UNIT/ML ~~LOC~~ SUSP
28.0000 [IU] | Freq: Every day | SUBCUTANEOUS | Status: DC
  Administered 2013-07-19: 28 [IU] via SUBCUTANEOUS
  Filled 2013-07-18: qty 10

## 2013-07-18 MED ORDER — INSULIN NPH (HUMAN) (ISOPHANE) 100 UNIT/ML ~~LOC~~ SUSP: 25.0000 [IU] | Freq: Two times a day (BID) | SUBCUTANEOUS | Status: DC

## 2013-07-18 MED ORDER — OXYCODONE-ACETAMINOPHEN 5-325 MG PO TABS: 1.0000 | ORAL_TABLET | ORAL | Status: DC | PRN

## 2013-07-18 NOTE — H&P (Signed)
[name] [name] is a 23 y.o. female G2P0010 at 39.0wks by 8wk U/S presenting for IOL for Class B DM. Denies leaking or bldg; reports +FM.  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 Diabetes mellitus   . Gonorrhea   . PCOS (polycystic ovarian syndrome)   . Cushing syndrome   . Hirsutism   . 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 Abscess drainage    . Tooth extraction     Family History: family history includes COPD in her maternal grandmother; Cancer in her paternal grandfather; Epilepsy in her father; Heart disease in her maternal grandmother; Hypertension in her maternal grandmother. Social History:  reports that she quit smoking about 7 months ago. She has never used smokeless tobacco. She reports that she does not drink alcohol or use illicit drugs.   Prenatal Transfer Tool  Maternal Diabetes: Yes:  Diabetes Type:  Insulin/Medication controlled Genetic Screening: Normal Maternal Ultrasounds/Referrals: Normal Fetal Ultrasounds or other Referrals:  Other:  nl fetal echo; EFW 3820gm Maternal Substance Abuse:  No Significant Maternal Medications:  None Significant Maternal Lab Results:  Lab values include: Group B Strep negative Other Comments:  None  ROS  Dilation: Fingertip Effacement (%): 50 Exam by:: Clelia CroftShaw CNM Blood pressure 120/61, pulse 85, temperature 98.2 F (36.8 C), temperature source Oral, resp. rate 18, height 5' 6.5" (1.689 m), weight 146.965 kg (324 lb), last menstrual period 10/04/2012. Exam Physical Exam  Constitutional: She is oriented to person, place, and  time. She appears well-developed.  Obese habitus  HENT:  Head: Normocephalic.  Neck: Normal range of motion.  Cardiovascular: Normal rate.   Respiratory: Effort normal.  GI:  EFM 150s +accels, no decels Irreg ctx  Genitourinary: Vagina normal.  Musculoskeletal: Normal range of motion.  Neurological: She is alert and oriented to person, place, and time.  Skin: Skin is warm and dry.  Psychiatric: She has a normal mood and affect. Her behavior is normal. Thought content normal.    Prenatal labs: ABO, Rh: --/--/O POS (01/14 2149) Antibody: PENDING (01/14 2149) Rubella: 1.78 (07/01 1447) RPR: NON REAC (11/17 1034)  HBsAg: NEGATIVE (07/01 1447)  HIV: NON REACTIVE (07/01 1447)  GBS: Negative (12/29 0000)   Assessment/Plan: IUP at 39.0wks Class B DM- uncontrolled EFW >90% Unfavorable cx  [facility] Rev'd IOL process including length and plan for C/S if active labor becomes protracted Will use Cytotec for cx ripening Home insulin and carb mod diet for now, and glucostabilizer with active labor    Cam HaiSHAW, KIMBERLY 07/18/2013, 10:41 PM

## 2013-07-19 LAB — GLUCOSE, CAPILLARY
GLUCOSE-CAPILLARY: 104 mg/dL — AB (ref 70–99)
GLUCOSE-CAPILLARY: 126 mg/dL — AB (ref 70–99)
GLUCOSE-CAPILLARY: 145 mg/dL — AB (ref 70–99)
GLUCOSE-CAPILLARY: 149 mg/dL — AB (ref 70–99)
GLUCOSE-CAPILLARY: 181 mg/dL — AB (ref 70–99)
GLUCOSE-CAPILLARY: 89 mg/dL (ref 70–99)
Glucose-Capillary: 75 mg/dL (ref 70–99)
Glucose-Capillary: 86 mg/dL (ref 70–99)
Glucose-Capillary: 151 mg/dL — ABNORMAL HIGH (ref 70–99)
Glucose-Capillary: 127 mg/dL — ABNORMAL HIGH (ref 70–99)
Glucose-Capillary: 101 mg/dL — ABNORMAL HIGH (ref 70–99)

## 2013-07-19 LAB — RPR: RPR Ser Ql: NONREACTIVE

## 2013-07-19 MED ORDER — INSULIN REGULAR HUMAN 100 UNIT/ML IJ SOLN: INTRAMUSCULAR | Status: DC

## 2013-07-19 MED ORDER — INSULIN REGULAR BOLUS VIA INFUSION
0.0000 [IU] | Freq: Three times a day (TID) | INTRAVENOUS | Status: DC
  Filled 2013-07-19: qty 20

## 2013-07-19 MED ORDER — OXYTOCIN 40 UNITS IN LACTATED RINGERS INFUSION - SIMPLE MED
1.0000 m[IU]/min | INTRAVENOUS | Status: DC
  Administered 2013-07-20: 2 m[IU]/min via INTRAVENOUS
  Administered 2013-07-20 (×2): 6 m[IU]/min via INTRAVENOUS
  Filled 2013-07-19: qty 1000

## 2013-07-19 MED ORDER — DEXTROSE 50 % IV SOLN: 25.0000 mL | INTRAVENOUS | Status: DC | PRN

## 2013-07-19 MED ORDER — FENTANYL CITRATE 0.05 MG/ML IJ SOLN: 100.0000 ug | INTRAMUSCULAR | Status: DC | PRN

## 2013-07-19 MED ORDER — FENTANYL CITRATE 0.05 MG/ML IJ SOLN: 50.0000 ug | INTRAMUSCULAR | Status: DC | PRN

## 2013-07-19 MED ORDER — SODIUM CHLORIDE 0.9 % IV SOLN
INTRAVENOUS | Status: DC
  Administered 2013-07-19: 0.3 [IU]/h via INTRAVENOUS
  Filled 2013-07-19: qty 1

## 2013-07-19 MED ORDER — DEXTROSE-NACL 5-0.45 % IV SOLN
INTRAVENOUS | Status: DC
  Administered 2013-07-19 – 2013-07-20 (×3): via INTRAVENOUS

## 2013-07-19 MED ORDER — SODIUM CHLORIDE 0.9 % IV SOLN: INTRAVENOUS | Status: DC

## 2013-07-19 MED ORDER — INSULIN REGULAR BOLUS VIA INFUSION: 0.0000 [IU] | Freq: Three times a day (TID) | INTRAVENOUS | Status: DC

## 2013-07-19 NOTE — Progress Notes (Signed)
[name] [name] is a 23 y.o. G2P0010 at 7766w1d admitted for induction of labor due to Gestational diabetes.  Subjective:   Objective: BP 126/76  Pulse 91  Temp(Src) 97.9 F (36.6 C) (Oral)  Resp 16  Ht 5' 6.5" (1.689 m)  Wt 146.965 kg (324 lb)  BMI 51.52 kg/m2  LMP 10/04/2012      FHT:  FHR: 140 bpm, variability: minimal ,  accelerations:  Present,  decelerations:  Present occasional small variables with quick return to baseline UC:   regular, every 2 minutes SVE:   Dilation: 1.5 Effacement (%): 30 Station: -3 Exam by:: Dr. Ike Benedom  Labs: Lab Results  Component Value Date   WBC 6.4 07/18/2013   HGB 10.9* 07/18/2013   HCT 32.8* 07/18/2013   MCV 94.8 07/18/2013   PLT 160 07/18/2013    Assessment / Plan: Induction of labor for gestational diabetes. S/p cytotec x3. Foley bulb in place. Contractions becoming more regular and painful. Will start glucomander for optimal glucose control during labor.  Labor: Progressing normally Preeclampsia:  no signs or symptoms of toxicity Fetal Wellbeing:  Category II Pain Control:  Labor support without medications I/D:  n/a Anticipated MOD:  NSVD  Beverely Lowdamo, Elena 07/19/2013, 2:25 PM

## 2013-07-19 NOTE — Progress Notes (Signed)
Pt doing well. No acute issues.  I spoke with and examined patient and agree with resident's note and plan of care.  [name] ScaleMichael Ryan Karanveer Ramakrishnan, MD OB Fellow 07/19/2013 5:19 PM

## 2013-07-19 NOTE — Progress Notes (Signed)
[name] [name] is a 23 y.o. G2P0010 at 6733w1d admitted for induction of labor due to Gestational diabetes.  Subjective: Pt has rec'd 3 doses of cytotec, FB placed at 1137 am on 1/15. Sugars at goal.  Objective: BP 123/60  Pulse 68  Temp(Src) 98.1 F (36.7 C) (Oral)  Resp 20  Ht 5' 6.5" (1.689 m)  Wt 146.965 kg (324 lb)  BMI 51.52 kg/m2  LMP 10/04/2012      FHT:  FHR: 140s bpm, variability: minimal ,  accelerations:  Abscent,  decelerations:  Absent UC:   irregular, every 5-10 minutes SVE:   Dilation: 1.5 Effacement (%): 30 Station: -3 Exam by:: Dr. Ike Benedom  Labs: Lab Results  Component Value Date   WBC 6.4 07/18/2013   HGB 10.9* 07/18/2013   HCT 32.8* 07/18/2013   MCV 94.8 07/18/2013   PLT 160 07/18/2013    Assessment / Plan: Induction of labor due to gestational diabetes,  Progressing on cytotec x3, now with FB.  Labor: Progressing normally Preeclampsia:  no signs or symptoms of toxicity Fetal Wellbeing:  Category II Pain Control:  Epidural and Fentanyl as needed I/D:  n/a Anticipated MOD:  NSVD  Deaun Rocha RYAN 07/19/2013, 11:39 AM

## 2013-07-19 NOTE — Progress Notes (Signed)
Informed Cam HaiKimberly Shaw, CNM that cytotec could not be given due to 3 UCs in 10 min. She stated she would put the cytotec in when she is on the floor.

## 2013-07-19 NOTE — Progress Notes (Signed)
Patient up to walk with telemetry monitor.

## 2013-07-19 NOTE — Progress Notes (Signed)
Dr Leslie Andrea notified of pt sleeping, UCs, FHR tracing, SVE, time of last cytotec dosing, and insulin orders not met from pt sleeping. Order recieved to give insulin prior to breakfast, just at a later time than scheduled.

## 2013-07-19 NOTE — Progress Notes (Signed)
[name] [name] is a 23 y.o. G2P0010 at 6461w1d admitted for induction of labor due to Gestational diabetes.  Subjective: Feeling fine, resting, contractions getting slightly more painful but still tolerating well   Objective: BP 130/69  Pulse 74  Temp(Src) 98.6 F (37 C) (Oral)  Resp 18  Ht 5' 6.5" (1.689 m)  Wt 146.965 kg (324 lb)  BMI 51.52 kg/m2  LMP 10/04/2012      FHT:  FHR: 155 bpm, variability: minimal ,  accelerations:  Present,  decelerations:  Present rare variables UC:   regular, every 1-2 minutes SVE:   Dilation: 1.5 Effacement (%): 30 Station: -3 Exam by:: Dr. Ike Benedom  Labs: Lab Results  Component Value Date   WBC 6.4 07/18/2013   HGB 10.9* 07/18/2013   HCT 32.8* 07/18/2013   MCV 94.8 07/18/2013   PLT 160 07/18/2013    Assessment / Plan: Induction of labor due to gHTN. Contracting well with foley bulb in place. Will wait for foley to come out then start pitocin  Labor: Progressing normally Preeclampsia:  no signs or symptoms of toxicity Fetal Wellbeing:  Category II Pain Control:  Labor support without medications I/D:  n/a Anticipated MOD:  NSVD On glucomander for close blood sugar control during labor. CBG range 160s-170s, titrate insulin drip per protocol with criteria 70-120.  [name] [name], [name] [name] 07/19/2013, 5:37 PM

## 2013-07-19 NOTE — Progress Notes (Signed)
Dr Ike Benedom notified of patient status, FB in place, pain level, FHR tracing with minimal variability and reactivity, Ucs, and requesting POC for BS control

## 2013-07-19 NOTE — Progress Notes (Signed)
Return to room, monitors adjusted and BS obtained.

## 2013-07-19 NOTE — Progress Notes (Signed)
I spoke with and examined patient and agree with resident's note and plan of care.  Tawana ScaleMichael Ryan Adnan Vanvoorhis, MD OB Fellow 07/19/2013 5:59 PM

## 2013-07-19 NOTE — Progress Notes (Signed)
Monitors off, patient up for shower.

## 2013-07-19 NOTE — H&P (Signed)
`````  Attestation of Attending Supervision of Advanced Practitioner: Evaluation and management procedures were performed by the PA/NP/CNM/OB Fellow under my supervision/collaboration. Chart reviewed and agree with management and plan.  Reiner Loewen V 07/19/2013 3:13 PM

## 2013-07-19 NOTE — Progress Notes (Signed)
Taken off telemetry monitor. Difficult to trace FHR.

## 2013-07-19 NOTE — Progress Notes (Signed)
   [name] [name] is a 23 y.o. G2P0010 at 5256w1d  admitted for induction of labor due to Gestational diabetes.  Subjective:  Foley fell out Objective: BP 117/63  Pulse 74  Temp(Src) 98.4 F (36.9 C) (Oral)  Resp 18  Ht 5' 6.5" (1.689 m)  Wt 146.965 kg (324 lb)  BMI 51.52 kg/m2  LMP 10/04/2012   CBG: 181 (pt snacked) On continuous insulin drip FHT:  FHR: 150 bpm, variability: moderate,  accelerations:  Present,  decelerations:  Absent UC:   irregular, every 3-10 minutes SVE:   4/60/-3/posterior Labs: Lab Results  Component Value Date   WBC 6.4 07/18/2013   HGB 10.9* 07/18/2013   HCT 32.8* 07/18/2013   MCV 94.8 07/18/2013   PLT 160 07/18/2013    Assessment / Plan: IOL for A2DM; ripening phase complete; will start pitocin  Labor: no Fetal Wellbeing:  Category I Pain Control:  Labor support without medications Anticipated MOD:  NSVD  [name] [name] Leverett 07/19/2013, 11:05 PM

## 2013-07-19 NOTE — Progress Notes (Signed)
Dr Ike Benedom notified of pt status, BS, order parameters for high/low BS range, current multiplier for glucostabilizer and orders for insulin changes per glucostablizer equalling zero. Orders given to change multipliers and range for expected BS.

## 2013-07-20 ENCOUNTER — Inpatient Hospital Stay (HOSPITAL_COMMUNITY): Payer: Medicaid Other | Admitting: Anesthesiology

## 2013-07-20 ENCOUNTER — Encounter (HOSPITAL_COMMUNITY): Payer: Self-pay

## 2013-07-20 ENCOUNTER — Encounter (HOSPITAL_COMMUNITY): Payer: Medicaid Other | Admitting: Anesthesiology

## 2013-07-20 DIAGNOSIS — E079 Disorder of thyroid, unspecified: Secondary | ICD-10-CM

## 2013-07-20 DIAGNOSIS — O99284 Endocrine, nutritional and metabolic diseases complicating childbirth: Secondary | ICD-10-CM

## 2013-07-20 LAB — CORD BLOOD GAS (ARTERIAL)

## 2013-07-20 LAB — GLUCOSE, CAPILLARY
GLUCOSE-CAPILLARY: 88 mg/dL (ref 70–99)
GLUCOSE-CAPILLARY: 82 mg/dL (ref 70–99)
GLUCOSE-CAPILLARY: 88 mg/dL (ref 70–99)
GLUCOSE-CAPILLARY: 76 mg/dL (ref 70–99)
GLUCOSE-CAPILLARY: 80 mg/dL (ref 70–99)
GLUCOSE-CAPILLARY: 76 mg/dL (ref 70–99)
GLUCOSE-CAPILLARY: 74 mg/dL (ref 70–99)
GLUCOSE-CAPILLARY: 88 mg/dL (ref 70–99)
GLUCOSE-CAPILLARY: 93 mg/dL (ref 70–99)
Glucose-Capillary: 102 mg/dL — ABNORMAL HIGH (ref 70–99)
Glucose-Capillary: 112 mg/dL — ABNORMAL HIGH (ref 70–99)
Glucose-Capillary: 114 mg/dL — ABNORMAL HIGH (ref 70–99)
Glucose-Capillary: 126 mg/dL — ABNORMAL HIGH (ref 70–99)
Glucose-Capillary: 71 mg/dL (ref 70–99)
Glucose-Capillary: 78 mg/dL (ref 70–99)
Glucose-Capillary: 78 mg/dL (ref 70–99)
Glucose-Capillary: 80 mg/dL (ref 70–99)
Glucose-Capillary: 82 mg/dL (ref 70–99)
Glucose-Capillary: 93 mg/dL (ref 70–99)
Glucose-Capillary: 95 mg/dL (ref 70–99)
Glucose-Capillary: 96 mg/dL (ref 70–99)
Glucose-Capillary: 99 mg/dL (ref 70–99)

## 2013-07-20 MED ORDER — EPHEDRINE 5 MG/ML INJ
10.0000 mg | INTRAVENOUS | Status: DC | PRN
  Filled 2013-07-20: qty 2

## 2013-07-20 MED ORDER — FENTANYL 2.5 MCG/ML BUPIVACAINE 1/10 % EPIDURAL INFUSION (WH - ANES)
14.0000 mL/h | INTRAMUSCULAR | Status: DC | PRN
  Filled 2013-07-20 (×2): qty 125

## 2013-07-20 MED ORDER — PHENYLEPHRINE 40 MCG/ML (10ML) SYRINGE FOR IV PUSH (FOR BLOOD PRESSURE SUPPORT)
80.0000 ug | PREFILLED_SYRINGE | INTRAVENOUS | Status: DC | PRN
  Filled 2013-07-20: qty 10
  Filled 2013-07-20: qty 2

## 2013-07-20 MED ORDER — EPHEDRINE 5 MG/ML INJ
10.0000 mg | INTRAVENOUS | Status: DC | PRN
  Filled 2013-07-20: qty 2
  Filled 2013-07-20: qty 4

## 2013-07-20 MED ORDER — FENTANYL 2.5 MCG/ML BUPIVACAINE 1/10 % EPIDURAL INFUSION (WH - ANES)
INTRAMUSCULAR | Status: DC | PRN
  Administered 2013-07-20: 14 mL/h via EPIDURAL

## 2013-07-20 MED ORDER — PHENYLEPHRINE 40 MCG/ML (10ML) SYRINGE FOR IV PUSH (FOR BLOOD PRESSURE SUPPORT)
80.0000 ug | PREFILLED_SYRINGE | INTRAVENOUS | Status: DC | PRN
  Filled 2013-07-20: qty 2

## 2013-07-20 MED ORDER — LIDOCAINE HCL (PF) 1 % IJ SOLN
INTRAMUSCULAR | Status: DC | PRN
  Administered 2013-07-20 (×2): 4 mL

## 2013-07-20 MED ORDER — DIPHENHYDRAMINE HCL 50 MG/ML IJ SOLN: 12.5000 mg | INTRAMUSCULAR | Status: DC | PRN

## 2013-07-20 MED ORDER — LACTATED RINGERS IV SOLN
500.0000 mL | Freq: Once | INTRAVENOUS | Status: AC
  Administered 2013-07-20: 500 mL via INTRAVENOUS

## 2013-07-20 NOTE — Progress Notes (Signed)
[name] [name] is a 23 y.o. G2P0010 at 7986w2d admitted for induction of labor due to A2DM Gestational diabetes.  Subjective: Pt evaluated for decel at 1330. Held pit, repositioned and used o2. Now pt is restarting pit and 1/2 dose and just now having adequate ctx. O2 off Will continue to monitor and repeat exam in 2 hrs.The patient is relatively comfortable after epidural. Sugars controlled - off insulin gtt.  Objective: BP 106/54  Pulse 75  Temp(Src) 98.1 F (36.7 C) (Oral)  Resp 18  Ht 5' 6.5" (1.689 m)  Wt 146.965 kg (324 lb)  BMI 51.52 kg/m2  LMP 10/04/2012      FHT:  FHR: 140s bpm, variability: moderate,  accelerations:  Abscent,  decelerations:  Present occassional late decel. not recurrent UC:   regular, every 2-4 minutes but difficult traceing SVE:   Dilation: 7 Effacement (%): 100 Station: -2 Exam by:: Montez MoritaErin Hampton, RNC  Labs: Lab Results  Component Value Date   WBC 6.4 07/18/2013   HGB 10.9* 07/18/2013   HCT 32.8* 07/18/2013   MCV 94.8 07/18/2013   PLT 160 07/18/2013    Assessment / Plan: Induction of labor due to gestational diabetes,  progressing well on pitocin  Labor:Restarted on pit, s/p SROM ~0945. IUPC and FSE placed. Now becoming adequate. Delayed exam with inadequate MVUs. Recheck in 2 hrs. Preeclampsia:  no signs or symptoms of toxicity Fetal Wellbeing:  Category II, pt with less than reassuring strip overall. Pit has been off once for Fetal intolerance. Pain Control:  epidural I/D:  n/a Anticipated MOD:  NSVD  Jamil Armwood RYAN 07/20/2013, 3:49 PM

## 2013-07-20 NOTE — Progress Notes (Signed)
[name] [name] is a 23 y.o. G2P0010 at 3854w2d admitted for induction of labor due to A2DM Gestational diabetes.  Subjective:  pt reevaluated for decel after run of tachysystole - resolved with O2  Objective: BP 129/65  Pulse 78  Temp(Src) 98.1 F (36.7 C) (Oral)  Resp 18  Ht 5' 6.5" (1.689 m)  Wt 146.965 kg (324 lb)  BMI 51.52 kg/m2  LMP 10/04/2012      FHT:  FHR: 150s bpm, variability: moderate,  accelerations:  Abscent,  decelerations:  Present prolonged decel resolved with O2,  UC:   regular, every 2-3 minutes but difficult traceing SVE:   Dilation: 8 Effacement (%): 70 Station: 0 Exam by:: Dr. Ike Benedom  Labs: Lab Results  Component Value Date   WBC 6.4 07/18/2013   HGB 10.9* 07/18/2013   HCT 32.8* 07/18/2013   MCV 94.8 07/18/2013   PLT 160 07/18/2013    Assessment / Plan: Induction of labor due to gestational diabetes,  progressing well on pitocin  Labor: Pt is adequate on Pit, if no significant change in 1 hr will move to section given large fetus and not tracking well on curve. Preeclampsia:  no signs or symptoms of toxicity Fetal Wellbeing:  Category II,  Pain Control:  epidural I/D:  n/a Anticipated MOD:  NSVD vs possible PLTCS  [name] [name] RYAN 07/20/2013, 4:52 PM

## 2013-07-20 NOTE — Anesthesia Preprocedure Evaluation (Signed)
Anesthesia Evaluation  Patient identified by MRN, date of birth, ID band Patient awake    Reviewed: Allergy & Precautions, H&P , Patient's Chart, lab work & pertinent test results  Airway Mallampati: III TM Distance: >3 FB Neck ROM: Full    Dental no notable dental hx. (+) Teeth Intact   Pulmonary former smoker,  breath sounds clear to auscultation  Pulmonary exam normal       Cardiovascular negative cardio ROS  Rhythm:Regular Rate:Normal     Neuro/Psych negative neurological ROS  negative psych ROS   GI/Hepatic Neg liver ROS, GERD-  Controlled and Medicated,  Endo/Other  diabetes, Well Controlled, Type 2, Insulin DependentMorbid obesity  Renal/GU negative Renal ROS  negative genitourinary   Musculoskeletal negative musculoskeletal ROS (+)   Abdominal (+) + obese,   Peds  Hematology negative hematology ROS (+)   Anesthesia Other Findings   Reproductive/Obstetrics (+) Pregnancy                           Anesthesia Physical Anesthesia Plan  ASA: III  Anesthesia Plan: Epidural   Post-op Pain Management:    Induction:   Airway Management Planned: Natural Airway  Additional Equipment:   Intra-op Plan:   Post-operative Plan:   Informed Consent: I have reviewed the patients History and Physical, chart, labs and discussed the procedure including the risks, benefits and alternatives for the proposed anesthesia with the patient or authorized representative who has indicated his/her understanding and acceptance.   Dental advisory given  Plan Discussed with: Anesthesiologist  Anesthesia Plan Comments:         Anesthesia Quick Evaluation

## 2013-07-20 NOTE — Progress Notes (Signed)
[name] [name] is a 23 y.o. G2P0010 at 244w2d admitted for induction of labor due to A2DM Gestational diabetes.  Subjective: The patient is relatively comfortable. Sugars controlled.   Objective: BP 104/47  Pulse 69  Temp(Src) 97.9 F (36.6 C) (Axillary)  Resp 18  Ht 5' 6.5" (1.689 m)  Wt 146.965 kg (324 lb)  BMI 51.52 kg/m2  LMP 10/04/2012      FHT:  FHR: 140s bpm, variability: moderate,  accelerations:  Present,  decelerations:  Present unable to distinguish. iupc placed. UC:   regular, every 2-3 minutes but difficult traceing SVE:   Dilation: 5 Effacement (%): 60 Station: -2 Exam by:: Dr. Ike Benedom  Labs: Lab Results  Component Value Date   WBC 6.4 07/18/2013   HGB 10.9* 07/18/2013   HCT 32.8* 07/18/2013   MCV 94.8 07/18/2013   PLT 160 07/18/2013    Assessment / Plan: Induction of labor due to gestational diabetes,  progressing well on pitocin  Labor: progressing normally on pit, s/p SROM ~0945. IUPC and FSE placed Preeclampsia:  no signs or symptoms of toxicity Fetal Wellbeing:  Category I Pain Control:  Labor support without medications at this time I/D:  n/a Anticipated MOD:  NSVD  [name] [name] 07/20/2013, 9:45 AM

## 2013-07-20 NOTE — Consult Note (Signed)
Neonatology Note:   Attendance at Delivery:    I was asked by Dr. Desp[facility]ure [facility] this NSVD at term due to thick meconium in the fluid and variable decelerations. The mother is a G2P0A1 O pos, GBS neg with Class B DM, on insulin, poorly controlled. She also has morbid obesity, polycystic ovarian syndrome, and possible Cushing syndrome. ROM 12 hours prior to delivery, fluid with thick meconium. There were variable FHR decelerations and, in the last few minutes prior to delivery, the FHR monitor was not picking up welI. At delivery, the baby was flaccid, apneic, and deeply meconium-stained. I bulb suctioned the baby for large amounts of thick, green fluid while the cord was being clamped and cut. The baby had a HR > 100, but no response to bulb suctioning, so we started PPV right away. Chest excursion could be seen. The HR stayed normal, but the baby continued apneic, so we bulb suctioned again, getting only a scant amount of mucous, then resumed PPV. At 3 min of life, the baby gasped once or twice, but remained virtually apneic. I called a Code Apgar to have additional personnel available in case they were needed. I intubated the baby atraumatically on the first attempt at 4.5 minutes of life. The CO2 detector turned yellow right away and bilaterally equal breath sounds could be heard. We secured the ETT at 11 cm at the lips, rechecked breath sounds, which continued to be equal. The baby's color and perfusion gradually improved through the resuscitation period.  Ap 2/3/4. Lungs mostly clear to ausc in DR, baby still not doing anything more than occasional gasps at 10-15 minutes of life. Tone still absent at 15 minutes (on arrival to NICU). I spoke with her parents briefly in the DR, then we transported the baby to the NICU for further care, with her father in attendance. Cord pH was 7.12.   Doretha Souhristie C. Deandra Gadson, MD

## 2013-07-20 NOTE — Progress Notes (Signed)
[name] [name] is a 23 y.o. G2P0010 at 6851w2d admitted for induction of labor due to A2DM Gestational diabetes.  Subjective: The patient is relatively comfortable after epidural. Sugars controlled.   Objective: BP 110/38  Pulse 67  Temp(Src) 98 F (36.7 C) (Oral)  Resp 18  Ht 5' 6.5" (1.689 m)  Wt 146.965 kg (324 lb)  BMI 51.52 kg/m2  LMP 10/04/2012      FHT:  FHR: 140s bpm, variability: minimal ,  accelerations:  Abscent,  decelerations:  Present occassional late and early decel UC:   regular, every 2-3 minutes but difficult traceing SVE:   Dilation: 7 Effacement (%): 100 Station: -2 Exam by:: Montez MoritaErin Hampton, RNC  Labs: Lab Results  Component Value Date   WBC 6.4 07/18/2013   HGB 10.9* 07/18/2013   HCT 32.8* 07/18/2013   MCV 94.8 07/18/2013   PLT 160 07/18/2013    Assessment / Plan: Induction of labor due to gestational diabetes,  progressing well on pitocin  Labor: progressing normally on pit, s/p SROM ~0945. IUPC and FSE placed, making chagne Preeclampsia:  no signs or symptoms of toxicity Fetal Wellbeing:  Category II, continuous monitoring and obs for fetal distress. Pain Control:  epidural I/D:  n/a Anticipated MOD:  NSVD  Ludene Stokke RYAN 07/20/2013, 12:56 PM

## 2013-07-20 NOTE — Progress Notes (Signed)
[name] [name] is a 23 y.o. G2P0010 at [redacted]w[redacted]d admitted for induction of labor due to A2DM Gestational diabetes.  Subjective:  pt reevaluated for decel after run of tachysystole - resolved with O2  Objective: BP 133/85  Pulse 91  Temp(Src) 99.2 F (37.3 C) (Axillary)  Resp 18  Ht 5' 6.5" (1.689 m)  Wt 146.965 kg (324 lb)  BMI 51.52 kg/m2  LMP 10/04/2012      FHT:  FHR: 150s bpm, variability: moderate,  accelerations:  Abscent,  decelerations:  Present no recent prolonged. Variable decels occasional UC:   regular, every 2-3 minutes  SVE:   Dilation: 8 Effacement (%): 70 Station: 0 Exam by:: Dr. Ike Benedom  Labs: Lab Results  Component Value Date   WBC 6.4 07/18/2013   HGB 10.9* 07/18/2013   HCT 32.8* 07/18/2013   MCV 94.8 07/18/2013   PLT 160 07/18/2013    Assessment / Plan: Induction of labor due to gestational diabetes,  progressing well on pitocin  Labor: Pt is now complete. Will start pushing. Preeclampsia:  no signs or symptoms of toxicity Fetal Wellbeing:  Category II,  Pain Control:  epidural I/D:  n/a Anticipated MOD:  NSVD [name] [name] 07/20/2013, 6:25 PM

## 2013-07-20 NOTE — Progress Notes (Signed)
[name] [name] is a 23 y.o. G2P0010 at 4951w2d admitted for induction of labor due to A2DM Gestational diabetes.  Subjective: The patient is sleeping.  Objective: BP 117/51  Pulse 79  Temp(Src) 98 F (36.7 C) (Oral)  Resp 18  Ht 5' 6.5" (1.689 m)  Wt 146.965 kg (324 lb)  BMI 51.52 kg/m2  LMP 10/04/2012      FHT:  FHR: 150 bpm, variability: moderate,  accelerations:  Present,  decelerations:  Absent UC:   regular, every 2-3 minutes SVE:   Dilation: 4 Effacement (%): 60 Station: -2 Exam by:: Cammy CopaJennifer Lineberry, RN  Labs: Lab Results  Component Value Date   WBC 6.4 07/18/2013   HGB 10.9* 07/18/2013   HCT 32.8* 07/18/2013   MCV 94.8 07/18/2013   PLT 160 07/18/2013    Assessment / Plan: Induction of labor due to gestational diabetes,  progressing well on pitocin  Labor: Progressing on Pitocin, will continue to increase then AROM Preeclampsia:  no signs or symptoms of toxicity Fetal Wellbeing:  Category I Pain Control:  Labor support without medications I/D:  n/a Anticipated MOD:  NSVD  Selena LesserBraimah, Tina 07/20/2013, 3:30 AM I have seen and examined this patient and agree the above assessment. CRESENZO-DISHMAN,Skyrah Krupp 07/20/2013 8:05 AM

## 2013-07-20 NOTE — Anesthesia Procedure Notes (Signed)
Epidural Patient location during procedure: OB Start time: 07/20/2013 10:56 AM  Staffing Anesthesiologist: Lauri Till A. Performed by: anesthesiologist   Preanesthetic Checklist Completed: patient identified, site marked, surgical consent, pre-op evaluation, timeout performed, IV checked, risks and benefits discussed and monitors and equipment checked  Epidural Patient position: sitting Prep: site prepped and draped and DuraPrep Patient monitoring: continuous pulse ox and blood pressure Approach: midline Injection technique: LOR air  Needle:  Needle type: Tuohy  Needle gauge: 17 G Needle length: 9 cm and 9 Needle insertion depth: 9 cm Catheter type: closed end flexible Catheter size: 19 Gauge Catheter at skin depth: 15 cm Test dose: negative and Other  Assessment Events: blood not aspirated, injection not painful, no injection resistance, negative IV test and no paresthesia  Additional Notes Patient identified. Risks and benefits discussed including failed block, incomplete  Pain control, post dural puncture headache, nerve damage, paralysis, blood pressure Changes, nausea, vomiting, reactions to medications-both toxic and allergic and post Partum back pain. All questions were answered. Patient expressed understanding and wished to proceed. Sterile technique was used throughout procedure. Epidural site was Dressed with sterile barrier dressing. No paresthesias, signs of intravascular injection Or signs of intrathecal spread were encountered.  Patient was more comfortable after the epidural was dosed. Please see RN's note for documentation of vital signs and FHR which are stable.

## 2013-07-21 ENCOUNTER — Encounter (HOSPITAL_COMMUNITY): Payer: Self-pay

## 2013-07-21 LAB — GLUCOSE, CAPILLARY
GLUCOSE-CAPILLARY: 92 mg/dL (ref 70–99)
Glucose-Capillary: 144 mg/dL — ABNORMAL HIGH (ref 70–99)
Glucose-Capillary: 88 mg/dL (ref 70–99)

## 2013-07-21 MED ORDER — ONDANSETRON HCL 4 MG PO TABS: 4.0000 mg | ORAL_TABLET | ORAL | Status: DC | PRN

## 2013-07-21 MED ORDER — OXYCODONE-ACETAMINOPHEN 5-325 MG PO TABS: 1.0000 | ORAL_TABLET | ORAL | Status: DC | PRN

## 2013-07-21 MED ORDER — ONDANSETRON HCL 4 MG/2ML IJ SOLN
INTRAMUSCULAR | Status: AC
  Filled 2013-07-21: qty 2

## 2013-07-21 MED ORDER — TETANUS-DIPHTH-ACELL PERTUSSIS 5-2.5-18.5 LF-MCG/0.5 IM SUSP: 0.5000 mL | Freq: Once | INTRAMUSCULAR | Status: DC

## 2013-07-21 MED ORDER — BENZOCAINE-MENTHOL 20-0.5 % EX AERO: 1.0000 "application " | INHALATION_SPRAY | CUTANEOUS | Status: DC | PRN

## 2013-07-21 MED ORDER — WITCH HAZEL-GLYCERIN EX PADS: 1.0000 "application " | MEDICATED_PAD | CUTANEOUS | Status: DC | PRN

## 2013-07-21 MED ORDER — IBUPROFEN 600 MG PO TABS
600.0000 mg | ORAL_TABLET | Freq: Four times a day (QID) | ORAL | Status: DC
  Administered 2013-07-21 – 2013-07-22 (×4): 600 mg via ORAL
  Filled 2013-07-21 (×4): qty 1

## 2013-07-21 MED ORDER — FENTANYL CITRATE 0.05 MG/ML IJ SOLN
INTRAMUSCULAR | Status: AC
  Filled 2013-07-21: qty 2

## 2013-07-21 MED ORDER — SENNOSIDES-DOCUSATE SODIUM 8.6-50 MG PO TABS
2.0000 | ORAL_TABLET | ORAL | Status: DC
  Administered 2013-07-21: 2 via ORAL
  Filled 2013-07-21 (×2): qty 2

## 2013-07-21 MED ORDER — DIBUCAINE 1 % RE OINT: 1.0000 "application " | TOPICAL_OINTMENT | RECTAL | Status: DC | PRN

## 2013-07-21 MED ORDER — LANOLIN HYDROUS EX OINT: TOPICAL_OINTMENT | CUTANEOUS | Status: DC | PRN

## 2013-07-21 MED ORDER — PHENYLEPHRINE 40 MCG/ML (10ML) SYRINGE FOR IV PUSH (FOR BLOOD PRESSURE SUPPORT)
PREFILLED_SYRINGE | INTRAVENOUS | Status: AC
  Filled 2013-07-21: qty 5

## 2013-07-21 MED ORDER — ONDANSETRON HCL 4 MG/2ML IJ SOLN: 4.0000 mg | INTRAMUSCULAR | Status: DC | PRN

## 2013-07-21 MED ORDER — SIMETHICONE 80 MG PO CHEW: 80.0000 mg | CHEWABLE_TABLET | ORAL | Status: DC | PRN

## 2013-07-21 MED ORDER — DIPHENHYDRAMINE HCL 25 MG PO CAPS: 25.0000 mg | ORAL_CAPSULE | Freq: Four times a day (QID) | ORAL | Status: DC | PRN

## 2013-07-21 MED ORDER — MORPHINE SULFATE 0.5 MG/ML IJ SOLN
INTRAMUSCULAR | Status: AC
Start: 2013-07-21 — End: 2013-07-21
  Filled 2013-07-21: qty 10

## 2013-07-21 MED ORDER — ZOLPIDEM TARTRATE 5 MG PO TABS: 5.0000 mg | ORAL_TABLET | Freq: Every evening | ORAL | Status: DC | PRN

## 2013-07-21 MED ORDER — OXYTOCIN 10 UNIT/ML IJ SOLN
INTRAMUSCULAR | Status: AC
  Filled 2013-07-21: qty 4

## 2013-07-21 MED ORDER — PRENATAL MULTIVITAMIN CH
1.0000 | ORAL_TABLET | Freq: Every day | ORAL | Status: DC
  Administered 2013-07-21: 1 via ORAL
  Filled 2013-07-21: qty 1

## 2013-07-21 NOTE — Anesthesia Postprocedure Evaluation (Signed)
  Anesthesia Post-op Note  Patient: [name] [name]  Procedure(s) Performed: * No procedures listed *  Patient Location: PACU and Mother/Baby  Anesthesia Type:Epidural  Level of Consciousness: awake, alert  and oriented  Airway and Oxygen Therapy: Patient Spontanous Breathing  Post-op Pain: none  Post-op Assessment: Post-op Vital signs reviewed, Patient's Cardiovascular Status Stable, No headache, No backache, No residual numbness and No residual motor weakness  Post-op Vital Signs: Reviewed and stable  Complications: No apparent anesthesia complications

## 2013-07-21 NOTE — Progress Notes (Signed)
Post Partum Day 1 Subjective: no complaints, up ad lib, voiding, tolerating PO, + flatus and bottle feeding.  She is attempting to pump.  Infant is doing well in NICU.  Objective: Blood pressure 113/53, pulse 76, temperature 98.1 F (36.7 C), temperature source Oral, resp. rate 18, height 5' 6.5" (1.689 m), weight 324 lb (146.965 kg), last menstrual period 10/04/2012, SpO2 99.00%, unknown if currently breastfeeding.  Physical Exam:  General: alert and no distress Lochia: appropriate Uterine Fundus: firm DVT Evaluation: No evidence of DVT seen on physical exam. Negative Homan's sign.   Recent Labs  07/18/13 2149  HGB 10.9*  HCT 32.8*    Assessment/Plan: Plan for discharge tomorrow, Lactation consult and Contraception OCPs Routine postpartum care   LOS: 3 days   Ladell Bey A, M.D. 07/21/2013, 9:26 AM

## 2013-07-22 LAB — GLUCOSE, CAPILLARY: GLUCOSE-CAPILLARY: 93 mg/dL (ref 70–99)

## 2013-07-22 MED ORDER — IBUPROFEN 800 MG PO TABS: 800.0000 mg | ORAL_TABLET | Freq: Three times a day (TID) | ORAL | Status: DC | PRN

## 2013-07-22 MED ORDER — OXYCODONE-ACETAMINOPHEN 5-325 MG PO TABS: 1.0000 | ORAL_TABLET | Freq: Four times a day (QID) | ORAL | Status: DC | PRN

## 2013-07-22 NOTE — Discharge Summary (Signed)
Obstetric Discharge Summary Reason for Admission: induction of labor Prenatal Procedures: NST and ultrasound Intrapartum Procedures: spontaneous vaginal delivery Postpartum Procedures: none Complications-Operative and Postpartum: none Hemoglobin  Date Value Range Status  07/18/2013 10.9* 12.0 - 15.0 g/dL Final     HCT  Date Value Range Status  07/18/2013 32.8* 36.0 - 46.0 % Final    Physical Exam:  General: alert, cooperative and no distress Lochia: appropriate Uterine Fundus: firm Incision: na DVT Evaluation: No evidence of DVT seen on physical exam.  Discharge Diagnoses: Term Pregnancy-delivered  Discharge Information: Date: 07/22/2013 Activity: pelvic rest Diet: routine Medications: Ibuprofen and Percocet Condition: stable Instructions: refer to practice specific booklet Discharge to: home Follow-up Information   [facility] (already scheduled)    Contact information:   [address] [place] [id number] [phone]      Newborn Data: Live born female  Birth Weight: 9 lb 9.1 oz (4340 g) APGAR: 2, 3 PH 7.12  NICU admission.  Diahn Waidelich H 07/22/2013, 11:17 AM

## 2013-07-22 NOTE — Discharge Instructions (Signed)

## 2013-07-22 NOTE — Progress Notes (Signed)
Discharge instructions reviewed with patient.  Patient states understanding of home care, medications, activity, signs/symptoms to report to seek help for and report to MD and return MD office visit.  No home equipment needed.  Patient ambulated for discharge in stable condition with staff without incident.

## 2013-07-23 ENCOUNTER — Ambulatory Visit: Payer: Self-pay

## 2013-07-23 NOTE — Lactation Note (Signed)
This note was copied from the chart of [name] [place]. Lactation Consultation Note       Follow up consult with this mom of a term baby, who will begin warming up from a cooling blanket this evening. Mom and baby are now 65 hours post partum. Mom has PCOS and cushing syndrome. Mom wants to provide breast milk, and has been pumping, but has not seen any colostrum. On exam, Mom has large, symmetrical breasts. With hand expression, I was not able to express any colostrum/milk/ I gave mom information on fenugreek and moringa, and told mom these may help, if she wanted to try. I also told mom that often when a mom has other hormonal inbalances, she can not produce breast milk. Mom seems to have accepted this, but also seems very disappointed. I advised her to continue pumping for a couple more days, and sees what happens.  I will follow this family in the NICU.  Patient Name: [name] [name] RUEAV'WToday's Date: 07/23/2013 Reason for consult: Follow-up assessment;NICU baby   Maternal Data    Feeding    LATCH Score/Interventions                      Lactation Tools Discussed/Used     Consult Status Consult Status: PRN Follow-up type:  (in NICU)    [name] [name], [name] [name] Anne 07/23/2013, 3:22 PM

## 2013-07-25 ENCOUNTER — Encounter: Payer: Self-pay | Admitting: *Deleted

## 2013-08-13 ENCOUNTER — Encounter: Payer: Self-pay | Admitting: Obstetrics & Gynecology

## 2013-08-13 ENCOUNTER — Ambulatory Visit (INDEPENDENT_AMBULATORY_CARE_PROVIDER_SITE_OTHER): Payer: Medicaid Other | Admitting: Obstetrics & Gynecology

## 2013-08-13 VITALS — BP 103/64 | HR 77 | Temp 98.2°F | Wt 301.9 lb

## 2013-08-13 DIAGNOSIS — O99345 Other mental disorders complicating the puerperium: Secondary | ICD-10-CM

## 2013-08-13 DIAGNOSIS — F3289 Other specified depressive episodes: Secondary | ICD-10-CM

## 2013-08-13 DIAGNOSIS — F329 Major depressive disorder, single episode, unspecified: Secondary | ICD-10-CM

## 2013-08-13 DIAGNOSIS — F53 Postpartum depression: Secondary | ICD-10-CM

## 2013-08-13 NOTE — Progress Notes (Deleted)
CSW received a call from Clinic staff reporting that MOB scored a 23 on her PPD screen and is stating SI without plan.  CSW and Dr. Ihor Dow met with MOB to offer support, education on PPD and make recommendations for treatment.  MOB does not feel she needs to be hospitalized, nor that it would be helpful.  She admits to feelings suicidal at times, but does not have a plan.  MOB contracted for safety with CSW and commits to calling 911 or going to the nearest ER if her emotions are too much for her to handle/before she acts on a plan.  Dr. Ihor Dow recommends an anti-depressant, which CSW agrees with, and initially MOB states she probably would not take it.  After explanation on how it works and why MOB may require this type of medication, MOB changed her mind and states she will take the medication.  CSW recommends outpatient counseling as a way of processing her feelings and learning coping skills.  MOB is open and willing for CSW to make a referral.  CSW will make a referral to [facility], based on location, and asked MOB to let CSW know if she does not receive a call from staff there within a week.  CSW stressed to MOB that she may call or come talk with CSW any time while baby is in the NICU for additional support.  CSW has a meeting scheduled with MOB on Wednesday [facility] for baby's plan of care) and will check in with her on that day regarding her emotional health.  MOB seemed appreciative of the care provided to her today and states she is feeling good about her plan of care today.

## 2013-08-13 NOTE — Progress Notes (Signed)
Patient ID: [name] [name], female   DOB: 09-30-1990, 23 y.o.   MRN: [id number] Subjective:     [name] Scalesaylor R Fridman is a 23 y.o. female who presents for a postpartum visit. She is 4 weeks postpartum following a spontaneous vaginal delivery. I have fully reviewed the prenatal and intrapartum course. The delivery was at 39 weeks. Outcome: spontaneous vaginal delivery. Postpartum course has been complicated with significant depression including suicidal ideation but, pt denies having a plan and reports that she would not leave her baby.  Baby's course has been complicated by an extended NICU stay. Baby is feeding by bottle. Patient is not sexually active. Contraception method is abstinence. Postpartum depression screening: positive. Has been seen by the NICU social worker and has refused meds or referrals   The following portions of the patient's history were reviewed and updated as appropriate: allergies, current medications, past family history, past medical history, past social history, past surgical history and problem list.  Review of Systems Pertinent items are noted in HPI.   Objective:    BP 103/64  Pulse 77  Temp(Src) 98.2 F (36.8 C)  Wt 301 lb 14.4 oz (136.941 kg)  Breastfeeding? No  General:  alert and depressed/flat affect                                      Assessment:    4weeks postpartum exam. Pap smear not done at today's visit.  Pt with suicidal ideation with no plan. Declines transfer to mental health facility.  Does agree to take meds and would like to return to work part time. I reviewed with pt the medical and disease state that depression results from. She agrees to treatment      Plan:    1. Contraception: abstinence 2. zoloft 25mg  per day for 2 weeks then increase to 50mg  daily. 3. Follow up in: 2 weeks or as needed. for PP check and review of depression sx 4. Referral to counseling (Child psychotherapistsocial worker to arrange)

## 2013-08-13 NOTE — Patient Instructions (Signed)

## 2013-08-13 NOTE — Progress Notes (Signed)
CSW received a call from Clinic staff reporting that MOB scored a 23 on her PPD screen and is stating SI without plan.  CSW and Dr. Ihor Dow met with MOB to offer support, education on PPD and make recommendations for treatment.  MOB does not feel she needs to be hospitalized, nor that it would be helpful.  She admits to feelings suicidal at times, but does not have a plan.  MOB contracted for safety with CSW and commits to calling 911 or going to the nearest ER if her emotions are too much for her to handle/before she acts on a plan.  Dr. Ihor Dow recommends an anti-depressant, which CSW agrees with, and initially MOB states she probably would not take it.  After explanation on how it works and why MOB may require this type of medication, MOB changed her mind and states she will take the medication.  CSW recommends outpatient counseling as a way of processing her feelings and learning coping skills.  MOB is open and willing for CSW to make a referral.  CSW will make a referral to [facility], based on location, and asked MOB to let CSW know if she does not receive a call from staff there within a week.  CSW stressed to MOB that she may call or come talk with CSW any time while baby is in the NICU for additional support.  CSW has a meeting scheduled with MOB on Wednesday [facility] for baby's plan of care) and will check in with her on that day regarding her emotional health.  MOB seemed appreciative of the care provided to her today and states she is feeling good about her plan of care today.

## 2013-08-14 ENCOUNTER — Other Ambulatory Visit: Payer: Self-pay | Admitting: Family Medicine

## 2013-08-14 MED ORDER — SERTRALINE HCL 25 MG PO TABS: ORAL_TABLET | ORAL | Status: DC

## 2013-08-14 NOTE — Progress Notes (Signed)
Sent rx to pharmacy per Dr. Erin FullingHarraway-Smith request.

## 2013-08-22 ENCOUNTER — Encounter: Payer: Self-pay | Admitting: Advanced Practice Midwife

## 2013-08-22 ENCOUNTER — Ambulatory Visit (INDEPENDENT_AMBULATORY_CARE_PROVIDER_SITE_OTHER): Payer: Medicaid Other | Admitting: Advanced Practice Midwife

## 2013-08-22 VITALS — BP 117/79 | HR 66 | Temp 98.8°F | Ht 67.0 in | Wt 297.5 lb

## 2013-08-22 DIAGNOSIS — F329 Major depressive disorder, single episode, unspecified: Secondary | ICD-10-CM

## 2013-08-22 DIAGNOSIS — O99345 Other mental disorders complicating the puerperium: Secondary | ICD-10-CM

## 2013-08-22 DIAGNOSIS — E119 Type 2 diabetes mellitus without complications: Secondary | ICD-10-CM

## 2013-08-22 DIAGNOSIS — F53 Postpartum depression: Secondary | ICD-10-CM

## 2013-08-22 DIAGNOSIS — F3289 Other specified depressive episodes: Secondary | ICD-10-CM

## 2013-08-22 LAB — GLUCOSE, CAPILLARY: GLUCOSE-CAPILLARY: 151 mg/dL — AB (ref 70–99)

## 2013-08-22 MED ORDER — NORGESTIMATE-ETH ESTRADIOL 0.25-35 MG-MCG PO TABS: ORAL_TABLET | ORAL | Status: DC

## 2013-08-22 NOTE — Patient Instructions (Signed)
[place] Resource Guide °(Revised August 2014) ° ° °Chronic Pain Problems:  °• [facility]:  [phone]  °         Patients need to be referred by their primary care doctor/specialist ° °Insufficient Money for Medicine:  °·         United Way: call "211"   °• MAP Program at [facility] [place] 641-8030 or [place] 641-7620 °          ° °No Primary Care Doctor:  °To locate a primary care doctor that accepts your insurance or provides certain services:  °·         [place] [facility]: 832-8000  °·         Physician Referral Service: 1-800-533-3463 ask for “My [place]” °• If no insurance, you need to see if you qualify for GCCN “orange card”, call to set      up appointment for eligibility/enrollment at [phone] or [phone] or visit [place] [facility] ([address], [place] and [address] -[place]) to meet with a GCCN enrollment specialist. ° °Agencies that provide inexpensive (sliding fee scale) medical care:  ° °•    [facility] - [facility] [place] - [phone] °•    [facility]  -  [facility] - 336-878-6027 °•    [facility] - 336-832-7272 °•    [facility] - 336-832-4444 °•    [facility] - 336-832-3150 °•    [facility] - 336-832-8035 °• [facility] - [facility] [place] - 336-272-     1050 °• [facility] [place] - 336-370-9091 °• [facility]: 336-832-8035  °• [facility]: 336-832-4777  °• [facility]: 336-373-0678  °• [facility] - 336- 387-6161 ° ° ° °Medicaid-accepting [place] Providers:  °·         [facility] - 641-2100 (No Family Planning accepted) °·         [address], 641-2100, Mon-Fri 9am-5pm °·          [facility] - [phone] °• [address], [address], Mon-Thursday 8am-5pm, Fri 8am-noon °• [facility] - [phone] °·         [address], [address], Mon-Fri 7:30am-4:30pm °·         [facility] - 299-7000 °·         [address] [place] [address], Mon-Fri 8am-5pm  °·        [facility] - 373-1557 °·         [address] °·         Only accepts [place] Access Medicaid patients after they have their name applied to their card ° °Self Pay (no insurance) in [place]:  °         Sickle Cell Patients:  °• [address], (336) 832-1970 °[facility]: °• [address], [place] (336) 832-7272 ° °     [facility] °• [address], [place] (336) 832-4444 ° °[facility]: °• [address], (336) 832-8035 °           [facility]  °·         [address], (336) 832-4400 °[facility] °• [address], (336) 832-3150 °          [facility]  °·         [address],  [phone] °       [facility] - [address]  °·         641-2100, Mon-Fri 9am-7pm, Sat 9am-1pm °         [facility] [place] °·         [address] [place] [address], [phone] °         [facility] [place]  °·         [address], 878-6027 °[facility] - [facility] [place] °• [address], [place] (336) 884-0224 °         [facility]  °·         [address] [place] [address], [phone] ° [facility]  °• [address], (336) 272-1050 °[facility] °• [address], (336) 370-9091 ° Dr. Osei-Bonsu  °·         [address], [place],  [phone] °         [facility]  °·         [address], [phone] °         [facility]  °·           [address], [phone] °         [facility]  °·         [address], [phone], 1st & 3rd Saturday every month, 10am-1pm ° °OTHERS:  [facility]  ([facility] Only)  (336) 379-0037 (Thursday only) ° °Strategies for finding a Primary Care Provider:  °1) Find a Doctor and Pay Out of Pocket  °Although you won't have to find out who is covered by your insurance plan, it is a good idea to ask around and get recommendations. You will then need to call the office and see if the doctor you have chosen will accept you as a new patient and what types of options they offer for patients who are self-pay. Some doctors offer discounts or will set up payment plans for their patients who do not have insurance, but you will need to ask so you aren't surprised when you get to your appointment.  °2) Contact [facility] - To see if you qualify for “orange card” access to healthcare safety net providers.  Call for appointment for eligibility/enrollment at [phone] or [phone]- [phone]. (Uninsured, 0-200% FPL, qualifying info)  °Applicants for GCCN are first required to see if they are eligible to enroll in the ACA Marketplace before enrolling in GCCN (and get an exemption if they are not).  °GCCN Criteria for acceptance is:  °? Proof of ACA Marketing exemption - form or documentation  °? Valid photo ID (driver's license, state identification card, passport, home country ID)  °? Proof of [place] residency (e.g. driver’s license, lease/landlord information, pay stubs with address, utility   bill, bank statement, etc.)  °? Proof of income (1040, last year's tax return, W2, 4 current pay stubs, other income proof)  °? Proof of assets (current bank statement + 3 most recent, disability paperwork, life insurance info, tax value on autos, etc.) ° °3)  Contact Your Local Health Department  °Not all health departments have doctors that can see patients for sick visits, but many do, so it is worth a call to see if yours does. If you don't know where your local health department is, you can check in your phone book. The CDC also has a tool to help you locate your state's health department, and many state websites also have listings of all of their local health departments.  °4) Find a Walk-in Clinic  °If your illness is not likely to be very severe or complicated, you may want to try a walk in clinic. These are popping up all over the country in pharmacies, drugstores, and shopping centers. They're usually staffed by nurse practitioners or physician assistants that have been trained to treat common illnesses and complaints. They're usually fairly quick and inexpensive. However, if you have serious medical issues or chronic medical problems, these are probably not your best option  ° °STD Testing:  °·         [place] [place], STD Clinic  °·         [address], [place], phone [phone] or [phone]  °·         Monday - Friday, call for an appointment °·         [facility] [place], STD Clinic  °·         [address], [place], phone [phone] or 1-877-539-9860  °·         Monday - Friday, call for an appointment °Abuse/Neglect:  °·         [place] Child Abuse Hotline: 641-3795  °·         [place] Child Abuse Hotline: 800-378-5315 (After Hours) ° °Emergency Shelter:  °[facility] (336) 271-5959 ° [facility] [place]- (336) 881-5415 ° [facility] [place] - (336) 235-0330 ° [facility] - (336) 375-1332 (ages 11-17) ° Homeless Day Shelter @ [facility] - (336) 332-0824 °  °Mammograms - Free at [facility] - [place] - 614-3233 ° °Maternity Homes:  °·         [facility]: (336) 275-9566   (Homeless mother with  children) °·         [facility]: (704) 372-4663 (Mothers only) °• [facility]: (336) 370-9232 (Pregnant 16-21 years old) °• [facility] -(336) 641-7777 ° °[place] [facility]  °• [facility] °• [address], [place] (336) 355-9701 °·         [facility] [place]  °·         [address], [place]  °·         349-3220 °·         [employer]  °·         [address], [place]  °·         342-7768 °·         [facility]  °·         [address], [place]  °·         342-8140 °·         [facility]  °·         342-8316 °·         [place] [facility]  °·         1-888-581-9988 °·         [facility] [place]  °·         [address]  °·         336-349-4454, Insurance °·         [place] Child Abuse Hotline  °·         (336) 342-1394  °·         (336) 342-3537 (After Hours) ° °Behavioral Health Services /Substance Abuse Resources:  °·         [facility]: 882-2125  °·         [facility]: 784-9470  °·        [facility]: (336) 285-9073  °• Narcotics Helpline - 1-866-375-1272 °·         [facility]: (336) 845-3988  °·         [facility]idential [facility] Outpatient Substance Abuse Program - [facility]: 800-659-3381 °• [facility] - (336) 285-2605 °Psychological Services: °·         [facility]: 832-9600  °• [facility]: 1-877-626-1772 °·         [facility]  °·         [address] [place] [address], [place]  °·         ACCESS LINE: 1-800-256-2452     (24 Hour) °• Mobile Crisis:  °• HELPLINES:  °[facility] - [place] (336) 370-4264 °[facility] - [facility] (800) 451-9682 °• Walk In Crisis Services °      [facility] [address] [place] [address] - [place]  (336)676-6905 °        Health - (336)832-9600 or (336) 832-9700 ° [facility] - [address] - [place] (336)899-1505 ° [facility] [place] [facility] - [address], [place] (336) 878-6098 ° ° °Dental Assistance:  °If unable to pay or uninsured, contact: [place] [facility] to become qualified for the adult dental clinic. Patient must be enrolled in GCCN (uninsured, 0-200% FPL, qualifying info).  Enroll in GCCN first, then see Primary Care Physician assigned to you, the PCP makes a dental referral. [facility] will receive referral and contacts patient for appointment. ° °Patients with Medicaid  °         [address], 510-2600 ° [facility]Children up to 20 + Pregnant Women) - (336) 641-3152 ° [facility] - [address] - [address] (336) 235-2808 ° °If unable to pay, or uninsured: contact [place] [facility] (641-3152 in [place] - (Children only + Pregnant Women), 641-7733 in [place]- Children only) to become qualified for the adult dental clinic  °Must see if eligible to enroll in ACA Health Insurance Marketplace before enrolling into the GCCN (exemption required) (1-855-733-3711 for an appointment)  www.healthcare.gov;   1-800-318-2596.  If not eligible for ACA, then go by Department of Health and Human Services to see if eligible for “orange card.”    [address], [place] and [address]- [place].  Once you get an orange card, you will have a Primary Care home who will then refer you to dental if needed. ° ° ° ° ° ° ° °Other Low-Cost Community Dental Services:  ° [facility] - 334-4822 (ext 50251) °  [address] [place] [address] ° Dr. Civils - 272-4177 °  [address] °  ° [facility] - 734-7550 °  [address] ° °         [facility]  °         [address], [place], [place], [address]  °         723-1848, Ext. 123  °         2nd and 4th Thursday of the month at 6:30am (Simple  extractions only - no wisdom teeth or surgery) First come/First serve -First 10 clients served ° °         [facility] ([place] residents only) °         [address], [place], [place], [address]  °         723-7904 °          °         [facility]  °         342-8273 °         [facility]  °        703-3100 °        [facility] °         570-6415 °  °Transportation Options: ° Ambulance - 911 - $250-$700 per ride °Family Member to accompany patient (if stable) - [place] [employer] - (336) 355-6499 ° PART - (336) 662-0002 ° Taxi - (336) 272-5112 - [place] ° [facility] - (336) 333-6589 (Application required) ° [place] [facility] - (336) 641-4848 °  ° °

## 2013-08-22 NOTE — Progress Notes (Signed)
Patient reports some improvement since last visit, but not much; also has had 27 lb weight loss in one month

## 2013-08-22 NOTE — Progress Notes (Deleted)
Patient ID: [name] [name], female   DOB: [birth year], 23 y.o.   MRN: [id number]

## 2013-08-22 NOTE — Progress Notes (Signed)
  Subjective:     [name] [name] is a 23 y.o. female who presents for a postpartum visit. She is 6 weeks postpartum following a spontaneous vaginal delivery. I have fully reviewed the prenatal and intrapartum course. The delivery was at 39 gestational weeks. Outcome: spontaneous vaginal delivery. Anesthesia: epidural. Postpartum course has been normal except for PP depression. Baby's course has been normal. Baby is feeding by formula. Bowel function is normal. Bladder function is normal. Patient is not sexually active. Contraception method is abstinence. Postpartum depression screening: positive.  The following portions of the patient's history were reviewed and updated as appropriate: allergies, current medications, past family history, past medical history, past social history, past surgical history and problem list.  Review of Systems A comprehensive review of systems was negative.   Objective:    BP 117/79  Pulse 66  Temp(Src) 98.8 F (37.1 C) (Oral)  Ht 5\' 7"  (1.702 m)  Wt 297 lb 8 oz (134.945 kg)  BMI 46.58 kg/m2  Breastfeeding? No  General:  alert, cooperative, appears stated age and no distress  Physical Examination: General appearance - alert, well appearing, and in no distress and oriented to person, place, and time Mental status - normal mood, behavior, speech, dress, motor activity, and thought processes Skin - normal coloration and turgor, no rashes, no suspicious skin lesions noted        Assessment:     Normal postpartum exam. Postpartum depression Diabetes mellitus    Plan:    1. Contraception: OCP (estrogen/progesterone). Discussed LARCs as most effective. Pt prefers OCPs at this time. 2. Refer pt to primary care to evaluate DM.  Pt has endocrinologist as well and plans to make appointment. 3.  Discussed depression symptoms and pt follow up plan.  Pt has appointment next week to begin counseling.  Encouraged pt to keep this appointment.  Pt taking Zoloft daily x2  weeks.  3. Follow up as needed.

## 2014-04-21 ENCOUNTER — Encounter (HOSPITAL_COMMUNITY): Payer: Self-pay | Admitting: Emergency Medicine

## 2014-04-21 ENCOUNTER — Emergency Department (HOSPITAL_COMMUNITY)
Admission: EM | Admit: 2014-04-21 | Discharge: 2014-04-22 | Disposition: A | Discharge status: Home / Self Care | Payer: Medicaid Other | Attending: Emergency Medicine | Admitting: Emergency Medicine

## 2014-04-21 DIAGNOSIS — Z8619 Personal history of other infectious and parasitic diseases: Secondary | ICD-10-CM | POA: Diagnosis not present

## 2014-04-21 DIAGNOSIS — R739 Hyperglycemia, unspecified: Secondary | ICD-10-CM

## 2014-04-21 DIAGNOSIS — Z87891 Personal history of nicotine dependence: Secondary | ICD-10-CM | POA: Insufficient documentation

## 2014-04-21 DIAGNOSIS — Z79899 Other long term (current) drug therapy: Secondary | ICD-10-CM | POA: Diagnosis not present

## 2014-04-21 DIAGNOSIS — E1165 Type 2 diabetes mellitus with hyperglycemia: Secondary | ICD-10-CM | POA: Diagnosis present

## 2014-04-21 DIAGNOSIS — Z3202 Encounter for pregnancy test, result negative: Secondary | ICD-10-CM | POA: Insufficient documentation

## 2014-04-21 LAB — CBG MONITORING, ED: GLUCOSE-CAPILLARY: 283 mg/dL — AB (ref 70–99)

## 2014-04-21 MED ORDER — SODIUM CHLORIDE 0.9 % IV BOLUS (SEPSIS): 1000.0000 mL | Freq: Once | INTRAVENOUS | Status: DC

## 2014-04-21 NOTE — ED Provider Notes (Signed)
CSN: [id number]     Arrival date & time 04/21/14  2216 History   First MD Initiated Contact with Patient 04/21/14 2303     Chief Complaint  Patient presents with  . Hyperglycemia      HPI Patient presents with complaints of elevated blood sugar.  She has a history of diabetes.  She is post be on metformin 500 twice a day and she believes Lantus 15 units daily.  She's been off her meds for one to 2 months.  She states that she's been hot and amount his been more dry lately.  She has insurance but does not have a primary care physician in [place].  [place] recently [place] from [place] back to [place].  No other complaints.  No chest pain shortness breath.  No abdominal pain.  Denies nausea vomiting.  She has gone through diabetic education   Past Medical History  Diagnosis Date  . Diabetes mellitus   . Gonorrhea   . PCOS (polycystic ovarian syndrome)   . Cushing syndrome   . Hirsutism   . 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 Abscess drainage    . Tooth extraction     Family History  Problem Relation Age of Onset  . COPD Maternal Grandmother   . Hypertension Maternal Grandmother   . Heart disease Maternal Grandmother   . Epilepsy Father   . Cancer Paternal Grandfather    History  Substance Use Topics  . Smoking status: Former Smoker    Quit date: 12/13/2012  . Smokeless tobacco: Never Used  . Alcohol Use: No   OB History   Grav Para Term Preterm Abortions TAB SAB Ect Mult Living   2 1 1  0 1 0 1 0 0 1     Review of Systems  All other systems reviewed and are negative.     Allergies  Review of patient's allergies indicates no known allergies.  Home Medications   Prior to Admission medications   Medication Sig Start Date End Date Taking? Authorizing Provider  ibuprofen (ADVIL,MOTRIN) 800 MG tablet Take 1 tablet (800 mg total) by mouth every 8 (eight) hours as needed. 07/22/13   Lazaro ArmsLuther H Eure, MD   norgestimate-ethinyl estradiol (ORTHO-CYCLEN,SPRINTEC,PREVIFEM) 0.25-35 MG-MCG tablet Take medication tablet one each day.  Do not take placebo tablets, instead start new pack after 21 medication tablets are completed. 08/22/13   Wilmer FloorLisa A Leftwich-Kirby, CNM  sertraline (ZOLOFT) 25 MG tablet 1 tablet po nightly for 2 weeks, then increase to 2 tablets nightly from there on 08/14/13   Vale HavenKeli L Beck, MD   BP 142/65  Pulse 77  Temp(Src) 98.8 F (37.1 C) (Oral)  Resp 20  Ht 5\' 7"  (1.702 m)  Wt 307 lb 12.8 oz (139.617 kg)  BMI 48.20 kg/m2  SpO2 98%  LMP 04/21/2014 Physical Exam  Nursing note and vitals reviewed. Constitutional: She is oriented to person, place, and time. She appears well-developed and well-nourished.  HENT:  Head: Normocephalic.  Eyes: EOM are normal.  Neck: Normal range of motion.  Pulmonary/Chest: Effort normal.  Abdominal: She exhibits no distension.  Musculoskeletal: Normal range of motion.  Neurological: She is alert and oriented to person, place, and time.  Psychiatric: She has a normal mood and affect.    ED Course  Procedures (including critical care time) Labs Review Labs Reviewed  CBG MONITORING, ED - Abnormal; Notable for the following:    Glucose-Capillary 283 (*)    All  other components within normal limits    Imaging Review No results found.   EKG Interpretation None      MDM   Final diagnoses:  Hyperglycemia      Overall well-appearing.  The patient understands importance of strong glycemic control.  She understands importance of her primary care physician.  She'll be prescribed metformin and Lantus for discharge home.  She understands return to the ER for new or worsening symptoms  Lyanne CoKevin M Izael Bessinger, MD 04/22/14 [phone]

## 2014-04-21 NOTE — ED Notes (Signed)
Pt aware urine sample is needed, cup provided 

## 2014-04-21 NOTE — ED Notes (Signed)
Pt presents with c/o hyperglycemia. Pt is a known diabetic and reports that she felt like her sugar was high approx one hour ago. Pt says she feels very hot and her mouth is very dry.

## 2014-04-22 LAB — CBC WITH DIFFERENTIAL/PLATELET
BASOS ABS: 0 10*3/uL (ref 0.0–0.1)
Basophils Relative: 0 % (ref 0–1)
EOS PCT: 1 % (ref 0–5)
Eosinophils Absolute: 0.1 10*3/uL (ref 0.0–0.7)
HCT: 37.5 % (ref 36.0–46.0)
Hemoglobin: 12.5 g/dL (ref 12.0–15.0)
Lymphocytes Relative: 35 % (ref 12–46)
Lymphs Abs: 2.1 10*3/uL (ref 0.7–4.0)
MCH: 29.8 pg (ref 26.0–34.0)
MCHC: 33.3 g/dL (ref 30.0–36.0)
MCV: 89.5 fL (ref 78.0–100.0)
MONO ABS: 0.4 10*3/uL (ref 0.1–1.0)
Monocytes Relative: 7 % (ref 3–12)
Neutro Abs: 3.4 10*3/uL (ref 1.7–7.7)
Neutrophils Relative %: 57 % (ref 43–77)
PLATELETS: 194 10*3/uL (ref 150–400)
RBC: 4.19 MIL/uL (ref 3.87–5.11)
RDW: 13.1 % (ref 11.5–15.5)
WBC: 6 10*3/uL (ref 4.0–10.5)

## 2014-04-22 LAB — URINALYSIS, ROUTINE W REFLEX MICROSCOPIC
BILIRUBIN URINE: NEGATIVE
KETONES UR: NEGATIVE mg/dL
Leukocytes, UA: NEGATIVE
Nitrite: NEGATIVE
PH: 6.5 (ref 5.0–8.0)
Protein, ur: 30 mg/dL — AB
Specific Gravity, Urine: 1.035 — ABNORMAL HIGH (ref 1.005–1.030)
Urobilinogen, UA: 1 mg/dL (ref 0.0–1.0)

## 2014-04-22 LAB — URINE MICROSCOPIC-ADD ON

## 2014-04-22 LAB — PREGNANCY, URINE: Preg Test, Ur: NEGATIVE

## 2014-04-22 LAB — BASIC METABOLIC PANEL
ANION GAP: 13 (ref 5–15)
BUN: 11 mg/dL (ref 6–23)
CALCIUM: 9 mg/dL (ref 8.4–10.5)
CO2: 26 mEq/L (ref 19–32)
Chloride: 102 mEq/L (ref 96–112)
Creatinine, Ser: 0.72 mg/dL (ref 0.50–1.10)
GFR calc Af Amer: >90 mL/min (ref >90)
GFR calc non Af Amer: >90 mL/min (ref >90)
GLUCOSE: 291 mg/dL — AB (ref 70–99)
Potassium: 3.8 mEq/L (ref 3.7–5.3)
Sodium: 141 mEq/L (ref 137–147)

## 2014-04-22 MED ORDER — GLUCOSE BLOOD VI STRP: ORAL_STRIP | Status: DC

## 2014-04-22 MED ORDER — INSULIN GLARGINE 100 UNIT/ML ~~LOC~~ SOLN: 10.0000 [IU] | Freq: Every day | SUBCUTANEOUS | Status: DC

## 2014-04-22 MED ORDER — FREESTYLE LANCETS MISC: Status: DC

## 2014-04-22 MED ORDER — METFORMIN HCL 500 MG PO TABS
500.0000 mg | ORAL_TABLET | Freq: Once | ORAL | Status: AC
  Administered 2014-04-22: 500 mg via ORAL
  Filled 2014-04-22: qty 1

## 2014-04-22 MED ORDER — METFORMIN HCL 500 MG PO TABS: 500.0000 mg | ORAL_TABLET | Freq: Two times a day (BID) | ORAL | Status: DC

## 2014-04-22 NOTE — Discharge Instructions (Signed)
Hyperglycemia °Hyperglycemia occurs when the glucose (sugar) in your blood is too high. Hyperglycemia can happen for many reasons, but it most often happens to people who do not know they have diabetes or are not managing their diabetes properly.  °CAUSES  °Whether you have diabetes or not, there are other causes of hyperglycemia. Hyperglycemia can occur when you have diabetes, but it can also occur in other situations that you might not be as aware of, such as: °Diabetes °· If you have diabetes and are having problems controlling your blood glucose, hyperglycemia could occur because of some of the following reasons: °¨ Not following your meal plan. °¨ Not taking your diabetes medications or not taking it properly. °¨ Exercising less or doing less activity than you normally do. °¨ Being sick. °Pre-diabetes °· This cannot be ignored. Before people develop Type 2 diabetes, they almost always have "pre-diabetes." This is when your blood glucose levels are higher than normal, but not yet high enough to be diagnosed as diabetes. Research has shown that some long-term damage to the body, especially the heart and circulatory system, may already be occurring during pre-diabetes. If you take action to manage your blood glucose when you have pre-diabetes, you may delay or prevent Type 2 diabetes from developing. °Stress °· If you have diabetes, you may be "diet" controlled or on oral medications or insulin to control your diabetes. However, you may find that your blood glucose is higher than usual in the hospital whether you have diabetes or not. This is often referred to as "stress hyperglycemia." Stress can elevate your blood glucose. This happens because of hormones put out by the body during times of stress. If stress has been the cause of your high blood glucose, it can be followed regularly by your caregiver. That way he/she can make sure your hyperglycemia does not continue to get worse or progress to  diabetes. °Steroids °· Steroids are medications that act on the infection fighting system (immune system) to block inflammation or infection. One side effect can be a rise in blood glucose. Most people can produce enough extra insulin to allow for this rise, but for those who cannot, steroids make blood glucose levels go even higher. It is not unusual for steroid treatments to "uncover" diabetes that is developing. It is not always possible to determine if the hyperglycemia will go away after the steroids are stopped. A special blood test called an A1c is sometimes done to determine if your blood glucose was elevated before the steroids were started. °SYMPTOMS °· Thirsty. °· Frequent urination. °· Dry mouth. °· Blurred vision. °· Tired or fatigue. °· Weakness. °· Sleepy. °· Tingling in feet or leg. °DIAGNOSIS  °Diagnosis is made by monitoring blood glucose in one or all of the following ways: °· A1c test. This is a chemical found in your blood. °· Fingerstick blood glucose monitoring. °· Laboratory results. °TREATMENT  °First, knowing the cause of the hyperglycemia is important before the hyperglycemia can be treated. Treatment may include, but is not be limited to: °· Education. °· Change or adjustment in medications. °· Change or adjustment in meal plan. °· Treatment for an illness, infection, etc. °· More frequent blood glucose monitoring. °· Change in exercise plan. °· Decreasing or stopping steroids. °· Lifestyle changes. °HOME CARE INSTRUCTIONS  °· Test your blood glucose as directed. °· Exercise regularly. Your caregiver will give you instructions about exercise. Pre-diabetes or diabetes which comes on with stress is helped by exercising. °· Eat wholesome,   balanced meals. Eat often and at regular, fixed times. Your caregiver or nutritionist will give you a meal plan to guide your sugar intake. °· Being at an ideal weight is important. If needed, losing as little as 10 to 15 pounds may help improve blood  glucose levels. °SEEK MEDICAL CARE IF:  °· You have questions about medicine, activity, or diet. °· You continue to have symptoms (problems such as increased thirst, urination, or weight gain). °SEEK IMMEDIATE MEDICAL CARE IF:  °· You are vomiting or have diarrhea. °· Your breath smells fruity. °· You are breathing faster or slower. °· You are very sleepy or incoherent. °· You have numbness, tingling, or pain in your feet or hands. °· You have chest pain. °· Your symptoms get worse even though you have been following your caregiver's orders. °· If you have any other questions or concerns. °Document Released: 12/15/2000 Document Revised: 09/13/2011 Document Reviewed: 10/18/2011 °[employer]® Patient Information ©2015 [employer]. This information is not intended to replace advice given to you by your health care provider. Make sure you discuss any questions you have with your health care provider. ° °Blood Glucose Monitoring °Monitoring your blood glucose (also know as blood sugar) helps you to manage your diabetes. It also helps you and your health care provider monitor your diabetes and determine how well your treatment plan is working. °WHY SHOULD YOU MONITOR YOUR BLOOD GLUCOSE? °· It can help you understand how food, exercise, and medicine affect your blood glucose. °· It allows you to know what your blood glucose is at any given moment. You can quickly tell if you are having low blood glucose (hypoglycemia) or high blood glucose (hyperglycemia). °· It can help you and your health care provider know how to adjust your medicines. °· It can help you understand how to manage an illness or adjust medicine for exercise. °WHEN SHOULD YOU TEST? °Your health care provider will help you decide how often you should check your blood glucose. This may depend on the type of diabetes you have, your diabetes control, or the types of medicines you are taking. Be sure to write down all of your blood glucose readings so that this  information can be reviewed with your health care provider. See below for examples of testing times that your health care provider may suggest. °Type 1 Diabetes °· Test 4 times a day if you are in good control, using an insulin pump, or perform multiple daily injections. °· If your diabetes is not well controlled or if you are sick, you may need to monitor more often. °· It is a good idea to also monitor: °¨ Before and after exercise. °¨ Between meals and 2 hours after a meal. °¨ Occasionally between 2:00 a.m. and 3:00 a.m. °Type 2 Diabetes °· It can vary with each person, but generally, if you are on insulin, test 4 times a day. °· If you take medicines by mouth (orally), test 2 times a day. °· If you are on a controlled diet, test once a day. °· If your diabetes is not well controlled or if you are sick, you may need to monitor more often. °HOW TO MONITOR YOUR BLOOD GLUCOSE °Supplies Needed °· Blood glucose meter. °· Test strips for your meter. Each meter has its own strips. You must use the strips that go with your own meter. °· A pricking needle (lancet). °· A device that holds the lancet (lancing device). °· A journal or log book to write down your   results. °Procedure °· Wash your hands with soap and water. Alcohol is not preferred. °· Prick the side of your finger (not the tip) with the lancet. °· Gently milk the finger until a small drop of blood appears. °· Follow the instructions that come with your meter for inserting the test strip, applying blood to the strip, and using your blood glucose meter. °Other Areas to Get Blood for Testing °Some meters allow you to use other areas of your body (other than your finger) to test your blood. These areas are called alternative sites. The most common alternative sites are: °· The forearm. °· The thigh. °· The back area of the lower leg. °· The palm of the hand. °The blood flow in these areas is slower. Therefore, the blood glucose values you get may be delayed, and  the numbers are different from what you would get from your fingers. Do not use alternative sites if you think you are having hypoglycemia. Your reading will not be accurate. Always use a finger if you are having hypoglycemia. Also, if you cannot feel your lows (hypoglycemia unawareness), always use your fingers for your blood glucose checks. °ADDITIONAL TIPS FOR GLUCOSE MONITORING °· Do not reuse lancets. °· Always carry your supplies with you. °· All blood glucose meters have a 24-hour "hotline" number to call if you have questions or need help. °· Adjust (calibrate) your blood glucose meter with a control solution after finishing a few boxes of strips. °BLOOD GLUCOSE RECORD KEEPING °It is a good idea to keep a daily record or log of your blood glucose readings. Most glucose meters, if not all, keep your glucose records stored in the meter. Some meters come with the ability to download your records to your home computer. Keeping a record of your blood glucose readings is especially helpful if you are wanting to look for patterns. Make notes to go along with the blood glucose readings because you might forget what happened at that exact time. Keeping good records helps you and your health care provider to work together to achieve good diabetes management.  °Document Released: 06/24/2003 Document Revised: 11/05/2013 Document Reviewed: 11/13/2012 °[employer]® Patient Information ©2015 [employer]. This information is not intended to replace advice given to you by your health care provider. Make sure you discuss any questions you have with your health care provider. ° °

## 2014-05-06 ENCOUNTER — Encounter (HOSPITAL_COMMUNITY): Payer: Self-pay | Admitting: Emergency Medicine

## 2014-06-14 DIAGNOSIS — Z8619 Personal history of other infectious and parasitic diseases: Secondary | ICD-10-CM | POA: Insufficient documentation

## 2014-06-14 DIAGNOSIS — E1165 Type 2 diabetes mellitus with hyperglycemia: Secondary | ICD-10-CM | POA: Diagnosis not present

## 2014-06-14 DIAGNOSIS — R51 Headache: Secondary | ICD-10-CM | POA: Insufficient documentation

## 2014-06-14 DIAGNOSIS — Z720 Tobacco use: Secondary | ICD-10-CM | POA: Diagnosis not present

## 2014-06-14 DIAGNOSIS — Z794 Long term (current) use of insulin: Secondary | ICD-10-CM | POA: Insufficient documentation

## 2014-06-14 DIAGNOSIS — Z3202 Encounter for pregnancy test, result negative: Secondary | ICD-10-CM | POA: Insufficient documentation

## 2014-06-15 ENCOUNTER — Emergency Department (HOSPITAL_COMMUNITY)
Admission: EM | Admit: 2014-06-15 | Discharge: 2014-06-15 | Disposition: A | Discharge status: Home / Self Care | Payer: Medicaid Other | Attending: Emergency Medicine | Admitting: Emergency Medicine

## 2014-06-15 ENCOUNTER — Encounter (HOSPITAL_COMMUNITY): Payer: Self-pay | Admitting: Emergency Medicine

## 2014-06-15 DIAGNOSIS — R519 Headache, unspecified: Secondary | ICD-10-CM

## 2014-06-15 DIAGNOSIS — R51 Headache: Secondary | ICD-10-CM

## 2014-06-15 DIAGNOSIS — R739 Hyperglycemia, unspecified: Secondary | ICD-10-CM

## 2014-06-15 LAB — BLOOD GAS, VENOUS
Acid-Base Excess: 1.8 mmol/L (ref 0.0–2.0)
BICARBONATE: 26.4 meq/L — AB (ref 20.0–24.0)
Drawn by: 356241
FIO2: 0.21 %
O2 Saturation: 79.8 %
PCO2 VEN: 43 mmHg — AB (ref 45.0–50.0)
PH VEN: 7.403 — AB (ref 7.250–7.300)
Patient temperature: 98
TCO2: 23.8 mmol/L (ref 0–100)
pO2, Ven: 45 mmHg (ref 30.0–45.0)

## 2014-06-15 LAB — COMPREHENSIVE METABOLIC PANEL
ALK PHOS: 108 U/L (ref 39–117)
ALT: 10 U/L (ref 0–35)
AST: 9 U/L (ref 0–37)
Albumin: 4 g/dL (ref 3.5–5.2)
Anion gap: 17 — ABNORMAL HIGH (ref 5–15)
BUN: 14 mg/dL (ref 6–23)
CALCIUM: 10 mg/dL (ref 8.4–10.5)
CO2: 25 meq/L (ref 19–32)
Chloride: 96 mEq/L (ref 96–112)
Creatinine, Ser: 0.6 mg/dL (ref 0.50–1.10)
GFR calc Af Amer: >90 mL/min (ref >90)
GFR calc non Af Amer: >90 mL/min (ref >90)
Glucose, Bld: 405 mg/dL — ABNORMAL HIGH (ref 70–99)
POTASSIUM: 4 meq/L (ref 3.7–5.3)
SODIUM: 138 meq/L (ref 137–147)
Total Bilirubin: 0.3 mg/dL (ref 0.3–1.2)
Total Protein: 8.6 g/dL — ABNORMAL HIGH (ref 6.0–8.3)

## 2014-06-15 LAB — BASIC METABOLIC PANEL
Anion gap: 14 (ref 5–15)
BUN: 15 mg/dL (ref 6–23)
CALCIUM: 9.8 mg/dL (ref 8.4–10.5)
CO2: 25 mEq/L (ref 19–32)
Chloride: 97 mEq/L (ref 96–112)
Creatinine, Ser: 0.54 mg/dL (ref 0.50–1.10)
GFR calc Af Amer: >90 mL/min (ref >90)
Glucose, Bld: 369 mg/dL — ABNORMAL HIGH (ref 70–99)
Potassium: 4.3 mEq/L (ref 3.7–5.3)
SODIUM: 136 meq/L — AB (ref 137–147)

## 2014-06-15 LAB — URINALYSIS, ROUTINE W REFLEX MICROSCOPIC
Bilirubin Urine: NEGATIVE
Glucose, UA: >1000 mg/dL — AB
Hgb urine dipstick: NEGATIVE
Ketones, ur: NEGATIVE mg/dL
Leukocytes, UA: NEGATIVE
Nitrite: NEGATIVE
Protein, ur: NEGATIVE mg/dL
SPECIFIC GRAVITY, URINE: 1.029 (ref 1.005–1.030)
Urobilinogen, UA: 1 mg/dL (ref 0.0–1.0)
pH: 6.5 (ref 5.0–8.0)

## 2014-06-15 LAB — CBC
HCT: 39.2 % (ref 36.0–46.0)
Hemoglobin: 13.2 g/dL (ref 12.0–15.0)
MCH: 30.5 pg (ref 26.0–34.0)
MCHC: 33.7 g/dL (ref 30.0–36.0)
MCV: 90.5 fL (ref 78.0–100.0)
Platelets: 204 10*3/uL (ref 150–400)
RBC: 4.33 MIL/uL (ref 3.87–5.11)
RDW: 12.7 % (ref 11.5–15.5)
WBC: 6 10*3/uL (ref 4.0–10.5)

## 2014-06-15 LAB — URINE MICROSCOPIC-ADD ON

## 2014-06-15 LAB — CBG MONITORING, ED
GLUCOSE-CAPILLARY: 335 mg/dL — AB (ref 70–99)
Glucose-Capillary: 391 mg/dL — ABNORMAL HIGH (ref 70–99)

## 2014-06-15 LAB — POC URINE PREG, ED: PREG TEST UR: NEGATIVE

## 2014-06-15 MED ORDER — METOCLOPRAMIDE HCL 5 MG/ML IJ SOLN
10.0000 mg | Freq: Once | INTRAMUSCULAR | Status: DC
  Filled 2014-06-15: qty 2

## 2014-06-15 MED ORDER — INSULIN ASPART 100 UNIT/ML ~~LOC~~ SOLN
5.0000 [IU] | Freq: Once | SUBCUTANEOUS | Status: AC
  Administered 2014-06-15: 5 [IU] via INTRAVENOUS
  Filled 2014-06-15: qty 1

## 2014-06-15 MED ORDER — METFORMIN HCL 500 MG PO TABS: 500.0000 mg | ORAL_TABLET | Freq: Two times a day (BID) | ORAL | Status: DC

## 2014-06-15 MED ORDER — INSULIN GLARGINE 100 UNIT/ML ~~LOC~~ SOLN: 10.0000 [IU] | Freq: Every day | SUBCUTANEOUS | Status: DC

## 2014-06-15 MED ORDER — KETOROLAC TROMETHAMINE 30 MG/ML IJ SOLN
30.0000 mg | Freq: Once | INTRAMUSCULAR | Status: DC
  Filled 2014-06-15: qty 1

## 2014-06-15 NOTE — ED Notes (Signed)
Pt reports that she has been out of her insulin since August, she used to take Regular and NPH, had CBGs in 300s even while on this. Pt states that she has been having other symptoms since that time such as a vaginal yeast infection for the past few days, "boils" to her chest opening up in the past week, a HA and R arm tingling today. Pt a&ox4, skin warm and dry, ambulatory to triage.

## 2014-06-15 NOTE — Discharge Instructions (Signed)
Begin taking metformin as well as insulin as prescribed. Be sure to check your blood sugar frequently after restarting these medications to ensure that your sugar does not drop too low. Follow-up with a primary care doctor. You [facility] been referred to the [facility]. Return to the emergency department if symptoms worsen.  Hyperglycemia Hyperglycemia occurs when the glucose (sugar) in your blood is too high. Hyperglycemia can happen for many reasons, but it most often happens to people who do not know they have diabetes or are not managing their diabetes properly.  CAUSES  Whether you have diabetes or not, there are other causes of hyperglycemia. Hyperglycemia can occur when you have diabetes, but it can also occur in other situations that you might not be as aware of, such as: Diabetes  If you have diabetes and are having problems controlling your blood glucose, hyperglycemia could occur because of some of the following reasons:  Not following your meal plan.  Not taking your diabetes medications or not taking it properly.  Exercising less or doing less activity than you normally do.  Being sick. Pre-diabetes  This cannot be ignored. Before people develop Type 2 diabetes, they almost always have "pre-diabetes." This is when your blood glucose levels are higher than normal, but not yet high enough to be diagnosed as diabetes. Research has shown that some long-term damage to the body, especially the heart and circulatory system, may already be occurring during pre-diabetes. If you take action to manage your blood glucose when you have pre-diabetes, you may delay or prevent Type 2 diabetes from developing. Stress  If you have diabetes, you may be "diet" controlled or on oral medications or insulin to control your diabetes. However, you may find that your blood glucose is higher than usual in the hospital whether you have diabetes or not. This is often referred to as "stress  hyperglycemia." Stress can elevate your blood glucose. This happens because of hormones put out by the body during times of stress. If stress has been the cause of your high blood glucose, it can be followed regularly by your caregiver. That way he/she can make sure your hyperglycemia does not continue to get worse or progress to diabetes. Steroids  Steroids are medications that act on the infection fighting system (immune system) to block inflammation or infection. One side effect can be a rise in blood glucose. Most people can produce enough extra insulin to allow for this rise, but for those who cannot, steroids make blood glucose levels go even higher. It is not unusual for steroid treatments to "uncover" diabetes that is developing. It is not always possible to determine if the hyperglycemia will go away after the steroids are stopped. A special blood test called an A1c is sometimes done to determine if your blood glucose was elevated before the steroids were started. SYMPTOMS  Thirsty.  Frequent urination.  Dry mouth.  Blurred vision.  Tired or fatigue.  Weakness.  Sleepy.  Tingling in feet or leg. DIAGNOSIS  Diagnosis is made by monitoring blood glucose in one or all of the following ways:  A1c test. This is a chemical found in your blood.  Fingerstick blood glucose monitoring.  Laboratory results. TREATMENT  First, knowing the cause of the hyperglycemia is important before the hyperglycemia can be treated. Treatment may include, but is not be limited to:  Education.  Change or adjustment in medications.  Change or adjustment in meal plan.  Treatment for an illness, infection, etc.  More frequent blood glucose monitoring.  Change in exercise plan.  Decreasing or stopping steroids.  Lifestyle changes. HOME CARE INSTRUCTIONS   Test your blood glucose as directed.  Exercise regularly. Your caregiver will give you instructions about exercise. Pre-diabetes or  diabetes which comes on with stress is helped by exercising.  Eat wholesome, balanced meals. Eat often and at regular, fixed times. Your caregiver or nutritionist will give you a meal plan to guide your sugar intake.  Being at an ideal weight is important. If needed, losing as little as 10 to 15 pounds may help improve blood glucose levels. SEEK MEDICAL CARE IF:   You have questions about medicine, activity, or diet.  You continue to have symptoms (problems such as increased thirst, urination, or weight gain). SEEK IMMEDIATE MEDICAL CARE IF:   You are vomiting or have diarrhea.  Your breath smells fruity.  You are breathing faster or slower.  You are very sleepy or incoherent.  You have numbness, tingling, or pain in your feet or hands.  You have chest pain.  Your symptoms get worse even though you have been following your caregiver's orders.  If you have any other questions or concerns. Document Released: 12/15/2000 Document Revised: 09/13/2011 Document Reviewed: 10/18/2011 [facility] Patient Information 2015 [place], [place]. This information is not intended to replace advice given to you by your health care provider. Make sure you discuss any questions you have with your health care provider.  Emergency Department Resource Guide 1) Find a Doctor and Pay Out of Pocket Although you won't have to find out who is covered by your insurance plan, it is a good idea to ask around and get recommendations. You will then need to call the office and see if the doctor you have chosen will accept you as a new patient and what types of options they offer for patients who are self-pay. Some doctors offer discounts or will set up payment plans for their patients who do not have insurance, but you will need to ask so you aren't surprised when you get to your appointment.  2) Contact Your Local Health Department Not all health departments have doctors that can see patients for sick visits, but many  do, so it is worth a call to see if yours does. If you don't know where your local health department is, you can check in your phone book. The CDC also has a tool to help you locate your state's health department, and many state websites also have listings of all of their local health departments.  3) Find a Walk-in Clinic If your illness is not likely to be very severe or complicated, you may want to try a walk in clinic. These are popping up all over the country in pharmacies, drugstores, and shopping centers. They're usually staffed by nurse practitioners or physician assistants that have been trained to treat common illnesses and complaints. They're usually fairly quick and inexpensive. However, if you have serious medical issues or chronic medical problems, these are probably not your best option.  No Primary Care Doctor: - Call Health Connect at  [phone] - they can help you locate a primary care doctor that  accepts your insurance, provides certain services, etc. - Physician Referral Service- [phone]  Chronic Pain Problems: Organization         Address  Phone   Notes  [facility]  [phone] Patients need to be referred by their primary care doctor.   Medication Assistance: Organization  Address  Phone   Notes  [facility] Medication [facility] [address] [place], [place] [address] [phone] --Must be a resident of [facility] -- Must have NO insurance coverage whatsoever (no Medicaid/ Medicare, etc.) -- The pt. MUST have a primary care doctor that directs their care regularly and follows them in the community   [facility]ist  [phone]   [employer]  [phone]    Agencies that provide inexpensive medical care: Organization         Address  Phone   Notes  [facility]  [phone]   [facility]    [phone]   [facility] [address] [place], [place] [address] [phone]   [facility] [address] [place] [place] [phone]   Planned Parenthood    [phone]   [facility]    [phone]   [facility]  [address], [place] Phone:  [phone], Fax:  [phone] Hours of Operation:  9 am - 6 pm, M-F.  Also accepts Medicaid/Medicare and self-pay.  [facility]  [address], [place] Phone: [phone], Fax: [phone]. Hours of Operation:  8:30 am - 5:30 pm, M-F.  Also accepts Medicaid and self-pay.  [facility] [place] [address] [place] Phone: [phone]   [facility] [address] Natasha Bence [place], [place] [phone], Ext. [phone] Mondays & Thursdays: 7-9 AM.  First 15 patients are seen on a first come, first serve basis.    Medicaid-accepting [facility] Providers:  Organization         Address  Phone   Notes  [facility] [address], [place] [phone] Also accepts self-pay patients.  [facility] [address] Laurell Josephs [place], [place]  [phone]   [facility] [address], [place] [phone]   [facility] [address] [place] [phone]   Renaye Rakers [address], [place]   [phone] Only accepts [place] patients after they have their name applied to their card.   Self-Pay (no insurance) in [facility]:  Organization         Address  Phone   Notes  Sickle Cell Patients, [facility] [address] [place], [place] [phone]   [facility] [address] [place], [place] [phone]   [facility] [place]  [address], [place] [phone]   [facility]/Dr. Osei-Bonsu  [address] [place] or  [address], [place] [phone] Phone number for both [place] and [facility] locations is the same.  [facility] [address] [place] [phone]   [facility] [address] [place] or [address] [phone]   [facility] [address] [place] [phone], phone; [phone], fax Sees patients 1st and 3rd Saturday of every month.  Must not qualify for public or private insurance (i.e. Medicaid, Medicare, [place] Health Choice, Veterans' Benefits)  Household income should be no more than 200% of the poverty level The clinic cannot treat you if you are pregnant or think you  are pregnant  Sexually transmitted diseases are not treated at the clinic.    Dental Care: Organization         Address  Phone  Notes  [facility] [address], [place] [phone] Accepts children up to age 24 who are enrolled in [place]icaid or [place] Health Choice; pregnant women with a Medicaid card; and children who have applied for Medicaid or [place] Health Choice, but were declined, whose parents can pay a reduced fee at time of service.  [facility] [place]  [address], [place] [phone] Accepts children up to age 24 who are enrolled in [place] or [place] Health Choice; pregnant women with a Medicaid card; and children who have applied for Medicaid or [place] Health Choice, but were declined, whose parents can pay a reduced fee at time of service.  [facility]  [address], [place] [phone] Patients are seen by appointment only. Walk-ins are not accepted. [facility] will see patients 24 years of age and older. Monday - Tuesday (8am-5pm) Most Wednesdays (8:30-5pm) $30 per visit, cash only  [facility]  [address], [facility]  [place] [phone] Patients are seen by appointment only. Walk-ins are not accepted. [facility] will see patients 24 years of age and older. One Wednesday Evening (Monthly: Volunteer Based).  $30 per visit, cash only  [employer] [facility]  [phone] for adults; Children under age 674, call Graduate Pediatric Dentistry at [phone]. Children aged 744-14, please call [phone] to request a pediatric application.  Dental services are provided in all areas of dental care including fillings, crowns and bridges, complete and partial dentures, implants, gum treatment, root canals, and extractions. Preventive care is also provided. Treatment is provided to both adults and children. Patients are selected via a lottery and there is often a waiting list.   [facility] [address], [place]  [phone] [email or web address]   [facility] [address], [place], [place] [phone], Ext. [phone] Second and Fourth Thursday of each month, opens at 6:30 AM; Clinic ends at 9 AM.  Patients are seen on a first-come first-served basis, and a limited number are seen during each clinic.   [facility]  [address], [place], [place] [phone]) [phone]   Eligibility Requirements You must have lived in [place], [place], or [place] for at least the last three months.   You cannot be eligible for state or federal sponsored [place] Cityhealthcare insurance, including CIGNAVeterans Administration, [place]icaid, or [employer]icare.   You generally cannot be eligible for healthcare insurance through your employer.    How to apply: Eligibility screenings are held every Tuesday and Wednesday afternoon from 1:00 pm until 4:00 pm. You do not need an appointment for the interview!  [facility] [address], [place], [place] [phone]   [facility]  [phone]   [facility]  [phone]   [facility]  [phone]    Behavioral Health Resources in the Community: Intensive Outpatient Programs Organization         Address  Phone  Notes  [facility] [address], [place], [place] [phone]   [facility] Outpatient [address], [place], [place] [phone]   [facility] [address], [place], [place]  [phone]   [facility] [address],  [place], [place] [phone] or [phone]   Substance Abuse Resources Organization         Address  Phone  Notes  [facility]  [phone]   [facility]  [phone]   [facility]  [phone]   [facility]  [phone]   Residential & Outpatient Substance Abuse Program  [phone]   Psychological Services Organization         Address  Phone  Notes  [facility]  [phone]   [facility]  [phone]- [phone]   [facility] [name], [place] [phone] or [phone]    Mobile Crisis Teams Organization         Address  Phone  Notes  [facility], Mobile Crisis Care Unit  [phone]   [facility]  [address] [place], [place] [phone]   Doristine LocksSharon DeEsch [address] [place] [phone]    Self-Help/Support Groups Organization         Address  Phone             Notes  [facility] [place] - variety of support groups  336- [phone] Call for more information  Narcotics Anonymous (NA), [facility] [address], [place]  2 meetings at this location   Statisticianesidential Treatment Programs Organization         Address  Phone  Notes  [facility] [address],    [place]  [phone]   [facility]  [address], [place] [phone], [place], [place] [phone]   [facility] [address], [place] [phone]  Admissions: 8am-3pm M-F  [facility] [address]    [place], [place] [phone]   [facility] [address], [place], [place] [phone]   [facility] [address]  [place], [place] [phone]   [facility] - Intensive Outpatient [address], [place], [place] [phone]   [facility] ([facility] [address]  [place], [place] [phone] or [phone]   [facility] ([facility]) [address] [place], [place] [phone] Accepts Medicaid  [facility] [address]  [place] [phone] Substance Abuse/Addiction Treatment   [facility] Resources Organization         Address  Phone  Notes  [facility]  [phone]   Angie FavaJulie Brannon, PhD [address], [address] [place], [place]   [phone](336) [phone] or [phone]   [facility]   [address] [place], [place] [phone](336) [phone]   [facility] [address], [place], [place] [phone] Insurance/Medicaid/sponsorship through [facility]  Faith and Families [address]                                    [place], [place] [phone] Therapy/tele-psych/case  [facility] [address].   [place], [place] [phone](336) [phone]    Dr. Lolly MustacheArfeen  [phone]   [facility]  [employer] [facility] 1) [address], [place] 2) [address], [place] 3)  [address], [place] [phone](336) [phone]  [phone]   [facility] Child Abuse Hotline [phone]  [phone] or [phone] (After Hours)

## 2014-06-21 NOTE — ED Provider Notes (Signed)
CSN: [id number]     Arrival date & time 06/14/14  2356 History   First MD Initiated Contact with Patient 06/15/14 0207     Chief Complaint  Patient presents with  . Headache  . Hyperglycemia    (Consider location/radiation/quality/duration/timing/severity/associated sxs/prior Treatment) HPI Comments: Patient is a 23 year old female with a history of diabetes mellitus and PCOS who presents to the emergency department for further evaluation of hyperglycemia and headache. Patient states that she has been out of her metformin and insulin since August 2015. She states that her sugars usually run between 200-300, but have been running higher lately. She has been experiencing polyuria and polydipsia. She also reports a headache which began yesterday afternoon which is throbbing and located in her frontal region. She took Excedrin which helped a little. She has a hx of similar headaches when her sugars are high, per patient. No associated fever, CP, SOB, abdominal pain, N/V, diarrhea, vision changes, or extremity numbness/weakness. No syncope.  Patient is a [age] y.o. female presenting with headaches and hyperglycemia. The history is provided by the patient. No language interpreter was used.  Headache Associated symptoms: no fever, no nausea, no numbness and no vomiting   Hyperglycemia Associated symptoms: increased thirst and polyuria   Associated symptoms: no chest pain, no fever, no nausea, no shortness of breath and no vomiting     Past Medical History  Diagnosis Date  . Diabetes mellitus   . Gonorrhea   . PCOS (polycystic ovarian syndrome)   . Cushing syndrome   . Hirsutism   . 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 Abscess drainage    . Tooth extraction     Family History  Problem Relation Age of Onset  . COPD Maternal Grandmother   . Hypertension Maternal Grandmother   . Heart disease Maternal Grandmother   . Epilepsy Father    . Cancer Paternal Grandfather    History  Substance Use Topics  . Smoking status: Current Every Day Smoker -- 0.10 packs/day    Types: Cigarettes    Last Attempt to Quit: 12/13/2012  . Smokeless tobacco: Never Used  . Alcohol Use: Yes     Comment: socially   OB History    Gravida Para Term Preterm AB TAB SAB Ectopic Multiple Living   2 1 1  0 1 0 1 0 0 1      Review of Systems  Constitutional: Negative for fever.  Respiratory: Negative for shortness of breath.   Cardiovascular: Negative for chest pain.  Gastrointestinal: Negative for nausea and vomiting.  Endocrine: Positive for polydipsia and polyuria.  Neurological: Positive for headaches. Negative for syncope, weakness and numbness.  All other systems reviewed and are negative.   Allergies  Review of patient's allergies indicates no known allergies.  Home Medications   Prior to Admission medications   Medication Sig Start Date End Date Taking? Authorizing Provider  glucose blood (FREESTYLE LITE) test strip Use as instructed 04/22/14   Lyanne CoKevin M Campos, MD  insulin glargine (LANTUS) 100 UNIT/ML injection Inject 0.1 mLs (10 Units total) into the skin at bedtime. 06/15/14   Antony MaduraKelly Rastus Borton, PA-C  Lancets (FREESTYLE) lancets Use as instructed 04/22/14   Lyanne CoKevin M Campos, MD  metFORMIN (GLUCOPHAGE) 500 MG tablet Take 1 tablet (500 mg total) by mouth 2 (two) times daily with a meal. 06/15/14   Antony MaduraKelly Audrick Lamoureaux, PA-C   BP 117/65 mmHg  Pulse 67  Temp(Src) 97.8 F (36.6 C) (Oral)  Resp 18  SpO2 96%  LMP 05/25/2014   Physical Exam  Constitutional: She is oriented to person, place, and time. She appears well-developed and well-nourished. No distress.  Nontoxic/nonseptic appearing  HENT:  Head: Normocephalic and atraumatic.  Dry mm  Eyes: Conjunctivae and EOM are normal. Pupils are equal, round, and reactive to light. No scleral icterus.  Neck: Normal range of motion.  Cardiovascular: Normal rate, regular rhythm and normal heart  sounds.   Pulmonary/Chest: Effort normal and breath sounds normal. No respiratory distress. She has no wheezes. She has no rales.  Respirations even and unlabored  Abdominal: Soft. There is no tenderness.  Soft, obese abdomen.  Musculoskeletal: Normal range of motion.  Neurological: She is alert and oriented to person, place, and time. No cranial nerve deficit. She exhibits normal muscle tone. Coordination normal.  GCS 15. Speech is goal oriented. No focal neurologic deficits appreciated. Patient moves extremities without ataxia and ambulates with normal, steady gait.  Skin: Skin is warm and dry. No rash noted. She is not diaphoretic. No erythema. No pallor.  Psychiatric: She has a normal mood and affect. Her behavior is normal.  Nursing note and vitals reviewed.   ED Course  Procedures (including critical care time) Labs Review Labs Reviewed  COMPREHENSIVE METABOLIC PANEL - Abnormal; Notable for the following:    Glucose, Bld 405 (*)    Total Protein 8.6 (*)    Anion gap 17 (*)    All other components within normal limits  URINALYSIS, ROUTINE W REFLEX MICROSCOPIC - Abnormal; Notable for the following:    Glucose, UA >1000 (*)    All other components within normal limits  BLOOD GAS, VENOUS - Abnormal; Notable for the following:    pH, Ven 7.403 (*)    pCO2, Ven 43.0 (*)    Bicarbonate 26.4 (*)    All other components within normal limits  BASIC METABOLIC PANEL - Abnormal; Notable for the following:    Sodium 136 (*)    Glucose, Bld 369 (*)    All other components within normal limits  CBG MONITORING, ED - Abnormal; Notable for the following:    Glucose-Capillary 391 (*)    All other components within normal limits  CBG MONITORING, ED - Abnormal; Notable for the following:    Glucose-Capillary 335 (*)    All other components within normal limits  CBC  URINE MICROSCOPIC-ADD ON  POC URINE PREG, ED  CBG MONITORING, ED    Imaging Review No results found.   EKG  Interpretation None      MDM   Final diagnoses:  Hyperglycemia  2. Headache, unspecified, not intractable   23 year old female with a history of diabetes mellitus presents to the emergency department for further evaluation of hyperglycemia. Patient also complaining of a headache which she states is consistent with prior episodes of hyperglycemia. Headache was not improved with Excedrin migraine tablets prior to arrival. Patient has no focal neurologic deficits on exam. Headache has spontaneously resolved over ED course. Toradol and Reglan initially ordered for HA which patient declined in ED.   Laboratory workup today reveals hyperglycemia of 405. This improved with IVF. No evidence of DKA; normal pH, no ketonuria, anion gap found to be mildly elevated but improved after administration of IVF. Patient given 5 units of IV insulin to further improve CBG of 335. Do not believe further emergent management is indicated, but have discussed the need for strict glycemic control and compliance wither her diabetes regimen. Metformin and Lantus Rx  provided at discharge. Resource guide and return precautions discussed. Patient agreeable to plan with no unaddressed concerns. Patient discharged in good condition; VSS.   Filed Vitals:   06/15/14 0009 06/15/14 0451  BP: 130/76 117/65  Pulse: 86 67  Temp: 98 F (36.7 C) 97.8 F (36.6 C)  TempSrc: Oral Oral  Resp: 20 18  SpO2: 98% 96%     Antony Madura, PA-C 06/21/14 2337  Jasmine Awe, MD 06/25/14 2302

## 2014-06-26 ENCOUNTER — Ambulatory Visit: Payer: Medicaid Other | Attending: Internal Medicine | Admitting: Internal Medicine

## 2014-06-26 ENCOUNTER — Encounter: Payer: Self-pay | Admitting: Internal Medicine

## 2014-06-26 VITALS — BP 122/79 | HR 77 | Temp 98.0°F | Resp 16 | Wt 303.4 lb

## 2014-06-26 DIAGNOSIS — F17210 Nicotine dependence, cigarettes, uncomplicated: Secondary | ICD-10-CM | POA: Diagnosis not present

## 2014-06-26 DIAGNOSIS — Z794 Long term (current) use of insulin: Secondary | ICD-10-CM | POA: Insufficient documentation

## 2014-06-26 DIAGNOSIS — E1165 Type 2 diabetes mellitus with hyperglycemia: Secondary | ICD-10-CM | POA: Insufficient documentation

## 2014-06-26 DIAGNOSIS — Z8742 Personal history of other diseases of the female genital tract: Secondary | ICD-10-CM

## 2014-06-26 DIAGNOSIS — Z139 Encounter for screening, unspecified: Secondary | ICD-10-CM

## 2014-06-26 DIAGNOSIS — Z720 Tobacco use: Secondary | ICD-10-CM

## 2014-06-26 DIAGNOSIS — E282 Polycystic ovarian syndrome: Secondary | ICD-10-CM | POA: Insufficient documentation

## 2014-06-26 DIAGNOSIS — E139 Other specified diabetes mellitus without complications: Secondary | ICD-10-CM

## 2014-06-26 DIAGNOSIS — F17200 Nicotine dependence, unspecified, uncomplicated: Secondary | ICD-10-CM

## 2014-06-26 LAB — COMPLETE METABOLIC PANEL WITH GFR
ALT: 9 U/L (ref 0–35)
AST: 9 U/L (ref 0–37)
Albumin: 4.2 g/dL (ref 3.5–5.2)
Alkaline Phosphatase: 80 U/L (ref 39–117)
BUN: 7 mg/dL (ref 6–23)
CO2: 26 mEq/L (ref 19–32)
CREATININE: 0.64 mg/dL (ref 0.50–1.10)
Calcium: 9.4 mg/dL (ref 8.4–10.5)
Chloride: 100 mEq/L (ref 96–112)
Glucose, Bld: 240 mg/dL — ABNORMAL HIGH (ref 70–99)
Potassium: 4 mEq/L (ref 3.5–5.3)
Sodium: 137 mEq/L (ref 135–145)
TOTAL PROTEIN: 7.7 g/dL (ref 6.0–8.3)
Total Bilirubin: 0.6 mg/dL (ref 0.2–1.2)

## 2014-06-26 LAB — GLUCOSE, POCT (MANUAL RESULT ENTRY): POC Glucose: 270 mg/dl — AB (ref 70–99)

## 2014-06-26 LAB — POCT GLYCOSYLATED HEMOGLOBIN (HGB A1C): Hemoglobin A1C: 10

## 2014-06-26 MED ORDER — INSULIN GLARGINE 100 UNIT/ML ~~LOC~~ SOLN: 15.0000 [IU] | Freq: Every day | SUBCUTANEOUS | Status: DC

## 2014-06-26 MED ORDER — METFORMIN HCL 1000 MG PO TABS: 1000.0000 mg | ORAL_TABLET | Freq: Two times a day (BID) | ORAL | Status: DC

## 2014-06-26 NOTE — Progress Notes (Deleted)
MRN: [id number] Name: [name] [name]  Sex: female Age: 23 y.o. DOB: May 20, 1991  Allergies: Review of patient's allergies indicates no known allergies.  Chief Complaint  Patient presents with  . Establish Care    HPI: Patient is 23 y.o. female who   Past Medical History  Diagnosis Date  . Diabetes mellitus   . Gonorrhea   . PCOS (polycystic ovarian syndrome)   . Cushing syndrome   . Hirsutism   . 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 Abscess drainage    . Tooth extraction        Medication List       This list is accurate as of: 06/26/14  2:39 PM.  Always use your most recent med list.               freestyle lancets  Use as instructed     glucose blood test strip  Commonly known as:  FREESTYLE LITE  Use as instructed     insulin glargine 100 UNIT/ML injection  Commonly known as:  LANTUS  Inject 0.15 mLs (15 Units total) into the skin at bedtime.     metFORMIN 1000 MG tablet  Commonly known as:  GLUCOPHAGE  Take 1 tablet (1,000 mg total) by mouth 2 (two) times daily with a meal.        Meds ordered this encounter  Medications  . metFORMIN (GLUCOPHAGE) 1000 MG tablet    Sig: Take 1 tablet (1,000 mg total) by mouth 2 (two) times daily with a meal.    Dispense:  60 tablet    Refill:  3  . insulin glargine (LANTUS) 100 UNIT/ML injection    Sig: Inject 0.15 mLs (15 Units total) into the skin at bedtime.    Dispense:  10 mL    Refill:  11    Immunization History  Administered Date(s) Administered  . Influenza,inj,Quad PF,36+ Mos 06/04/2013  . Tdap 06/04/2013    Family History  Problem Relation Age of Onset  . COPD Maternal Grandmother   . Hypertension Maternal Grandmother   . Heart disease Maternal Grandmother   . Epilepsy Father   . Cancer Father     lung cancer  . Cancer Paternal Grandfather     History  Substance Use Topics  . Smoking status: Light Tobacco Smoker -- 0.10  packs/day    Types: Cigarettes    Last Attempt to Quit: 12/13/2012  . Smokeless tobacco: Never Used  . Alcohol Use: 0.0 oz/week    0 Not specified per week     Comment: socially    Review of Systems   As noted in HPI  Filed Vitals:   06/26/14 1400  BP: 122/79  Pulse: 77  Temp: 98 F (36.7 C)  Resp: 16    Physical Exam  Physical Exam  CBC    Component Value Date/Time   WBC 6.0 06/15/2014 0044   RBC 4.33 06/15/2014 0044   HGB 13.2 06/15/2014 0044   HCT 39.2 06/15/2014 0044   PLT 204 06/15/2014 0044   MCV 90.5 06/15/2014 0044   LYMPHSABS 2.1 04/21/2014 2356   MONOABS 0.4 04/21/2014 2356   EOSABS 0.1 04/21/2014 2356   BASOSABS 0.0 04/21/2014 2356    CMP     Component Value Date/Time   NA 136* 06/15/2014 0333   K 4.3 06/15/2014 0333   CL 97 06/15/2014 0333   CO2 25 06/15/2014 0333   GLUCOSE 369*  06/15/2014 0333   BUN 15 06/15/2014 0333   CREATININE 0.54 06/15/2014 0333   CALCIUM 9.8 06/15/2014 0333   PROT 8.6* 06/15/2014 0044   ALBUMIN 4.0 06/15/2014 0044   AST 9 06/15/2014 0044   ALT 10 06/15/2014 0044   ALKPHOS 108 06/15/2014 0044   BILITOT 0.3 06/15/2014 0044   GFRNONAA >90 06/15/2014 0333   GFRAA >90 06/15/2014 0333    No results found for: CHOL  No components found for: HGA1C  Lab Results  Component Value Date/Time   AST 9 06/15/2014 12:44 AM    Assessment and Plan  Other specified diabetes mellitus without complications - Plan: Glucose (CBG), HgB A1c  Smoking  Screening - Plan: COMPLETE METABOLIC PANEL WITH GFR, TSH, Vit D  25 hydroxy (rtn osteoporosis monitoring)  History of PCOS   Health Maintenance  -Pap Smear: patient to follow up with OBGYN  -Vaccinations: patient declines flu shot    Return in about 3 months (around 09/25/2014) for diabetes.  Doris CheadleADVANI, Zamzam Whinery, MD

## 2014-06-26 NOTE — Progress Notes (Signed)
Patient Demographics  [name] [name], is a 23 y.o. female  UJW:[name]  [id number]  DOB - February 04, 1991  CC:  Chief Complaint  Patient presents with  . Establish Care       HPI: [name] [name] is a 23 y.o. female here today to establish medical care.she has history of diabetes, Cushing syndrome, PCOS, as per patient in the past he was following her with OB/GYN as well as was referred to see endocrinologist and as per patient she was prescribed Lantus as well as metformin, but during the pregnancy patient was only kept on insulin at bedtime her hemoglobin A1c and diabetes was well controlled but after the patient delivered her baby in January since then she has not been on any medications, as [place] just came back in in the month of September, also 10 days ago she went to the emergency room with symptoms of headache and hyperglycemia, EMR reviewed, she was treated with IV fluids and IV insulin, ketones were negative, her sugar level improved, currently for the last 10 days she has been taking the medication including Lantus 10 units each bedtime, metformin 500 mg twice a day, as per patient her fasting sugar is usually around 200 mg/dL, denies any hypoglycemic symptoms.  Patient has No headache, No chest pain, No abdominal pain - No Nausea, No new weakness tingling or numbness, No Cough - SOB.  No Known Allergies Past Medical History  Diagnosis Date  . Diabetes mellitus   . Gonorrhea   . PCOS (polycystic ovarian syndrome)   . Cushing syndrome   . Hirsutism   . 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 glucose blood (FREESTYLE LITE) test strip Use as instructed 100 each 12  . Lancets (FREESTYLE) lancets Use as instructed 100 each 12   No current facility-administered medications on file prior to visit.   Family History  Problem Relation Age of Onset  .  COPD Maternal Grandmother   . Hypertension Maternal Grandmother   . Heart disease Maternal Grandmother   . Epilepsy Father   . Cancer Father     lung cancer  . Cancer Paternal Grandfather    History   Social History  . Marital Status: Single    Spouse Name: N/A    Number of Children: N/A  . Years of Education: N/A   Occupational History  . Not on file.   Social History Main Topics  . Smoking status: Light Tobacco Smoker -- 0.10 packs/day    Types: Cigarettes    Last Attempt to Quit: 12/13/2012  . Smokeless tobacco: Never Used  . Alcohol Use: 0.0 oz/week    0 Not specified per week     Comment: socially  . Drug Use: No  . Sexual Activity:    Partners: Male    Birth Control/ Protection: None   Other Topics Concern  . Not on file   Social History Narrative    Review of Systems: Constitutional: Negative for fever, chills, diaphoresis, activity change, appetite change and fatigue. HENT: Negative for ear pain, nosebleeds, congestion, facial swelling, rhinorrhea, neck pain, neck stiffness and ear discharge.  Eyes: Negative for pain, discharge, redness, itching and visual disturbance. Respiratory: Negative for cough, choking, chest tightness, shortness of breath, wheezing and stridor.  Cardiovascular: Negative for chest pain, palpitations and leg swelling. Gastrointestinal: Negative for abdominal distention. Genitourinary: Negative for dysuria, urgency, frequency, hematuria,  flank pain, decreased urine volume, difficulty urinating and dyspareunia.  Musculoskeletal: Negative for back pain, joint swelling, arthralgia and gait problem. Neurological: Negative for dizziness, tremors, seizures, syncope, facial asymmetry, speech difficulty, weakness, light-headedness, numbness and headaches.  Hematological: Negative for adenopathy. Does not bruise/bleed easily. Psychiatric/Behavioral: Negative for hallucinations, behavioral problems, confusion, dysphoric mood, decreased  concentration and agitation.    Objective:   Filed Vitals:   06/26/14 1400  BP: 122/79  Pulse: 77  Temp: 98 F (36.7 C)  Resp: 16    Physical Exam: Constitutional: obese female sitting comfortably not in acute distress. HENT: Normocephalic, atraumatic, External right and left ear normal. Oropharynx is clear and moist.  Eyes: Conjunctivae and EOM are normal. PERRLA, no scleral icterus. Neck: Normal ROM. Neck supple. No JVD. No tracheal deviation. No thyromegaly. CVS: RRR, S1/S2 +, no murmurs, no gallops, no carotid bruit.  Pulmonary: Effort and breath sounds normal, no stridor, rhonchi, wheezes, rales.  Abdominal: Soft. BS +, no distension, tenderness, rebound or guarding.  Musculoskeletal: Normal range of motion. No edema and no tenderness.  Lymphadenopathy: No lymphadenopathy noted, cervical, inguinal or axillary Neuro: Alert. Normal reflexes, muscle tone coordination. No cranial nerve deficit. Skin: Skin is warm and dry. No rash noted. Not diaphoretic. No erythema. No pallor. Psychiatric: Normal mood and affect. Behavior, judgment, thought content normal.  Lab Results  Component Value Date   WBC 6.0 06/15/2014   HGB 13.2 06/15/2014   HCT 39.2 06/15/2014   MCV 90.5 06/15/2014   PLT 204 06/15/2014   Lab Results  Component Value Date   CREATININE 0.54 06/15/2014   BUN 15 06/15/2014   NA 136* 06/15/2014   K 4.3 06/15/2014   CL 97 06/15/2014   CO2 25 06/15/2014    Lab Results  Component Value Date   HGBA1C 10% 06/26/2014   Lipid Panel  No results found for: CHOL, TRIG, HDL, CHOLHDL, VLDL, LDLCALC     Assessment and plan:   1. Other specified diabetes mellitus without complications Results for orders placed or performed in visit on 06/26/14  Glucose (CBG)  Result Value Ref Range   POC Glucose 270 (A) 70 - 99 mg/dl  HgB [id number]  Result Value Ref Range   Hemoglobin A1C 10%    Diabetes is uncontrolled, since patient was off the medication for the last 10 months,  recently resumed back on Lantus and metformin, I have advised patient to increase the dose of Lantus to 15 units each bedtime, I have increased the dose of metformin thousand milligram twice a day, advise patient for diabetes meal planning, will repeat A1c in 3 months  2. Smoking Advised patient to quit smoking.  3. Screening Ordered baseline blood work. - COMPLETE METABOLIC PANEL WITH GFR - TSH - Vit D  25 hydroxy (rtn osteoporosis monitoring)  4. History of PCOS Currently patient is on metformin, patient to follow with her OB/GYN.        Health Maintenance  -Pap Smear:patient will make a followup appointment with her OB/GYN  -Vaccinations:  Patient declines flu shot.  Return in about 3 months (around 09/25/2014) for diabetes.    Doris CheadleADVANI, Deah Ottaway, MD

## 2014-06-26 NOTE — Patient Instructions (Signed)
Diabetes Mellitus and Food It is important for you to manage your blood sugar (glucose) level. Your blood glucose level can be greatly affected by what you eat. Eating healthier foods in the appropriate amounts throughout the day at about the same time each day will help you control your blood glucose level. It can also help slow or prevent worsening of your diabetes mellitus. Healthy eating may even help you improve the level of your blood pressure and reach or maintain a healthy weight.  HOW CAN FOOD AFFECT ME? Carbohydrates Carbohydrates affect your blood glucose level more than any other type of food. Your dietitian will help you determine how many carbohydrates to eat at each meal and teach you how to count carbohydrates. Counting carbohydrates is important to keep your blood glucose at a healthy level, especially if you are using insulin or taking certain medicines for diabetes mellitus. Alcohol Alcohol can cause sudden decreases in blood glucose (hypoglycemia), especially if you use insulin or take certain medicines for diabetes mellitus. Hypoglycemia can be a life-threatening condition. Symptoms of hypoglycemia (sleepiness, dizziness, and disorientation) are similar to symptoms of having too much alcohol.  If your health care provider has given you approval to drink alcohol, do so in moderation and use the following guidelines:  Women should not have more than one drink per day, and men should not have more than two drinks per day. One drink is equal to:  12 oz of beer.  5 oz of wine.  1 oz of hard liquor.  Do not drink on an empty stomach.  Keep yourself hydrated. Have water, diet soda, or unsweetened iced tea.  Regular soda, juice, and other mixers might contain a lot of carbohydrates and should be counted. WHAT FOODS ARE NOT RECOMMENDED? As you make food choices, it is important to remember that all foods are not the same. Some foods have fewer nutrients per serving than other  foods, even though they might have the same number of calories or carbohydrates. It is difficult to get your body what it needs when you eat foods with fewer nutrients. Examples of foods that you should avoid that are high in calories and carbohydrates but low in nutrients include:  Trans fats (most processed foods list trans fats on the Nutrition Facts label).  Regular soda.  Juice.  Candy.  Sweets, such as cake, pie, doughnuts, and cookies.  Fried foods. WHAT FOODS CAN I EAT? Have nutrient-rich foods, which will nourish your body and keep you healthy. The food you should eat also will depend on several factors, including:  The calories you need.  The medicines you take.  Your weight.  Your blood glucose level.  Your blood pressure level.  Your cholesterol level. You also should eat a variety of foods, including:  Protein, such as meat, poultry, fish, tofu, nuts, and seeds (lean animal proteins are best).  Fruits.  Vegetables.  Dairy products, such as milk, cheese, and yogurt (low fat is best).  Breads, grains, pasta, cereal, rice, and beans.  Fats such as olive oil, trans fat-free margarine, canola oil, avocado, and olives. DOES EVERYONE WITH DIABETES MELLITUS HAVE THE SAME MEAL PLAN? Because every person with diabetes mellitus is different, there is not one meal plan that works for everyone. It is very important that you meet with a dietitian who will help you create a meal plan that is just right for you. Document Released: 03/18/2005 Document Revised: 06/26/2013 Document Reviewed: 05/18/2013 Exit[employer] Patient Information 2015 [employer]. This   information is not intended to replace advice given to you by your health care provider. Make sure you discuss any questions you have with your health care provider.  

## 2014-06-26 NOTE — Progress Notes (Signed)
Patient here for follow up from the hospital  Here to establish care and a primary doctor

## 2014-06-27 LAB — TSH: TSH: 1.34 u[IU]/mL (ref 0.350–4.500)

## 2014-06-27 LAB — VITAMIN D 25 HYDROXY (VIT D DEFICIENCY, FRACTURES): Vit D, 25-Hydroxy: 12 ng/mL — ABNORMAL LOW (ref 30–100)

## 2014-07-09 ENCOUNTER — Telehealth: Payer: Self-pay | Admitting: *Deleted

## 2014-07-09 MED ORDER — VITAMIN D (ERGOCALCIFEROL) 1.25 MG (50000 UNIT) PO CAPS: 50000.0000 [IU] | ORAL_CAPSULE | ORAL | Status: DC

## 2014-07-09 NOTE — Telephone Encounter (Signed)
-----   Message from Doris Cheadleeepak Advani, MD sent at 07/01/2014 10:03 AM EST ----- Blood work reviewed, noticed low vitamin D, call patient advise to start ergocalciferol 50,000 units once a week for the duration of  12 weeks.

## 2014-07-09 NOTE — Telephone Encounter (Signed)
Rx Ergocalciferol was e-scribe to [facility] Left voice message to return call

## 2014-09-20 ENCOUNTER — Encounter (HOSPITAL_COMMUNITY): Payer: Self-pay | Admitting: Emergency Medicine

## 2014-09-20 ENCOUNTER — Emergency Department (HOSPITAL_COMMUNITY)
Admission: EM | Admit: 2014-09-20 | Discharge: 2014-09-20 | Disposition: A | Discharge status: Home / Self Care | Payer: Medicaid Other | Attending: Emergency Medicine | Admitting: Emergency Medicine

## 2014-09-20 DIAGNOSIS — Z791 Long term (current) use of non-steroidal anti-inflammatories (NSAID): Secondary | ICD-10-CM | POA: Insufficient documentation

## 2014-09-20 DIAGNOSIS — M62830 Muscle spasm of back: Secondary | ICD-10-CM | POA: Diagnosis not present

## 2014-09-20 DIAGNOSIS — Z720 Tobacco use: Secondary | ICD-10-CM | POA: Diagnosis not present

## 2014-09-20 DIAGNOSIS — Z872 Personal history of diseases of the skin and subcutaneous tissue: Secondary | ICD-10-CM | POA: Diagnosis not present

## 2014-09-20 DIAGNOSIS — Z79899 Other long term (current) drug therapy: Secondary | ICD-10-CM | POA: Diagnosis not present

## 2014-09-20 DIAGNOSIS — M545 Low back pain, unspecified: Secondary | ICD-10-CM

## 2014-09-20 DIAGNOSIS — Z8619 Personal history of other infectious and parasitic diseases: Secondary | ICD-10-CM | POA: Insufficient documentation

## 2014-09-20 DIAGNOSIS — E119 Type 2 diabetes mellitus without complications: Secondary | ICD-10-CM | POA: Diagnosis not present

## 2014-09-20 DIAGNOSIS — Z794 Long term (current) use of insulin: Secondary | ICD-10-CM | POA: Diagnosis not present

## 2014-09-20 MED ORDER — NAPROXEN 500 MG PO TABS
500.0000 mg | ORAL_TABLET | Freq: Once | ORAL | Status: AC
  Administered 2014-09-20: 500 mg via ORAL
  Filled 2014-09-20: qty 1

## 2014-09-20 MED ORDER — NAPROXEN 500 MG PO TABS: 500.0000 mg | ORAL_TABLET | Freq: Two times a day (BID) | ORAL | Status: DC | PRN

## 2014-09-20 MED ORDER — CYCLOBENZAPRINE HCL 10 MG PO TABS: 10.0000 mg | ORAL_TABLET | Freq: Three times a day (TID) | ORAL | Status: DC | PRN

## 2014-09-20 NOTE — ED Notes (Signed)
Pt c/o back pain starting "a few days ago" described as "throbbing" rated 8/10. Has tried Aleve, OTC medications with no alleviation. Took two Aleve around 1330. Denies hx back injury. Ambulatory with steady gait. Denies numbness in legs.

## 2014-09-20 NOTE — ED Notes (Signed)
Pt c/o low back pain for three days; denies any injury.

## 2014-09-20 NOTE — Discharge Instructions (Signed)
Back Pain:  Your back pain should be treated with medicines such as ibuprofen or aleve and this back pain should get better over the next 2 weeks.  However if you develop severe or worsening pain, low back pain with fever, numbness, weakness or inability to walk or urinate, you should return to the ER immediately.  Please follow up with your doctor this week for a recheck if still having symptoms. Avoid heavy lifting.  Low back pain is discomfort in the lower back that may be due to injuries to muscles and ligaments around the spine.  Occasionally, it may be caused by a a problem to a part of the spine called a disc.  The pain may last several days or a week;  However, most patients get completely well in 4 weeks.  Self - care:  The application of heat can help soothe the pain.  Maintaining your daily activities, including walking, is encourged, as it will help you get better faster than just staying in bed. Perform gentle stretching as discussed. Drink plenty of fluids.  Medications are also useful to help with pain control.  A commonly prescribed medication includes tylenol. Don't take more than 3,000mg  daily of tylenol.  Non steroidal anti inflammatory medications including Ibuprofen and naproxen;  These medications help both pain and swelling and are very useful in treating back pain.  They should be taken with food, as they can cause stomach upset, and more seriously, stomach bleeding.    Muscle relaxants:  These medications can help with muscle tightness that is a cause of lower back pain.  Most of these medications can cause drowsiness, and it is not safe to drive or use dangerous machinery while taking them.  SEEK IMMEDIATE MEDICAL ATTENTION IF: New numbness, tingling, weakness, or problem with the use of your arms or legs.  Severe back pain not relieved with medications.  Difficulty with or loss of control of your bowel or bladder control.  Increasing pain in any areas of the body (such as  chest or abdominal pain).  Shortness of breath, dizziness or fainting.  Nausea (feeling sick to your stomach), vomiting, fever, or sweats.  You will need to follow up with  Your primary healthcare provider in 1-2 weeks for reassessment.   Back Pain, Adult Low back pain is very common. About 1 in 5 people have back pain.The cause of low back pain is rarely dangerous. The pain often gets better over time.About half of people with a sudden onset of back pain feel better in just 2 weeks. About 8 in 10 people feel better by 6 weeks.  CAUSES Some common causes of back pain include:  Strain of the muscles or ligaments supporting the spine.  Wear and tear (degeneration) of the spinal discs.  Arthritis.  Direct injury to the back. DIAGNOSIS Most of the time, the direct cause of low back pain is not known.However, back pain can be treated effectively even when the exact cause of the pain is unknown.Answering your caregiver's questions about your overall health and symptoms is one of the most accurate ways to make sure the cause of your pain is not dangerous. If your caregiver needs more information, he or she may order lab work or imaging tests (X-rays or MRIs).However, even if imaging tests show changes in your back, this usually does not require surgery. HOME CARE INSTRUCTIONS For many people, back pain returns.Since low back pain is rarely dangerous, it is often a condition that people can learn  to Dammeron Valleymanageon their own.   Remain active. It is stressful on the back to sit or stand in one place. Do not sit, drive, or stand in one place for more than 30 minutes at a time. Take short walks on level surfaces as soon as pain allows.Try to increase the length of time you walk each day.  Do not stay in bed.Resting more than 1 or 2 days can delay your recovery.  Do not avoid exercise or work.Your body is made to move.It is not dangerous to be active, even though your back may hurt.Your back  will likely heal faster if you return to being active before your pain is gone.  Pay attention to your body when you bend and lift. Many people have less discomfortwhen lifting if they bend their knees, keep the load close to their bodies,and avoid twisting. Often, the most comfortable positions are those that put less stress on your recovering back.  Find a comfortable position to sleep. Use a firm mattress and lie on your side with your knees slightly bent. If you lie on your back, put a pillow under your knees.  Only take over-the-counter or prescription medicines as directed by your caregiver. Over-the-counter medicines to reduce pain and inflammation are often the most helpful.Your caregiver may prescribe muscle relaxant drugs.These medicines help dull your pain so you can more quickly return to your normal activities and healthy exercise.  Put ice on the injured area.  Put ice in a plastic bag.  Place a towel between your skin and the bag.  Leave the ice on for 15-20 minutes, 03-04 times a day for the first 2 to 3 days. After that, ice and heat may be alternated to reduce pain and spasms.  Ask your caregiver about trying back exercises and gentle massage. This may be of some benefit.  Avoid feeling anxious or stressed.Stress increases muscle tension and can worsen back pain.It is important to recognize when you are anxious or stressed and learn ways to manage it.Exercise is a great option. SEEK MEDICAL CARE IF:  You have pain that is not relieved with rest or medicine.  You have pain that does not improve in 1 week.  You have new symptoms.  You are generally not feeling well. SEEK IMMEDIATE MEDICAL CARE IF:   You have pain that radiates from your back into your legs.  You develop new bowel or bladder control problems.  You have unusual weakness or numbness in your arms or legs.  You develop nausea or vomiting.  You develop abdominal pain.  You feel  faint. Document Released: 06/21/2005 Document Revised: 12/21/2011 Document Reviewed: 10/23/2013 [facility] Patient Information 2015 [place], [place]. This information is not intended to replace advice given to you by your health care provider. Make sure you discuss any questions you have with your health care provider.  Back Exercises Back exercises help treat and prevent back injuries. The goal is to increase your strength in your belly (abdominal) and back muscles. These exercises can also help with flexibility. Start these exercises when told by your doctor. HOME CARE Back exercises include: Pelvic Tilt.  Lie on your back with your knees bent. Tilt your pelvis until the lower part of your back is against the floor. Hold this position 5 to 10 sec. Repeat this exercise 5 to 10 times. Knee to Chest.  Pull 1 knee up against your chest and hold for 20 to 30 seconds. Repeat this with the other knee. This may be done  with the other leg straight or bent, whichever feels better. Then, pull both knees up against your chest. Sit-Ups or Curl-Ups.  Bend your knees 90 degrees. Start with tilting your pelvis, and do a partial, slow sit-up. Only lift your upper half 30 to 45 degrees off the floor. Take at least 2 to 3 seonds for each sit-up. Do not do sit-ups with your knees out straight. If partial sit-ups are difficult, simply do the above but with only tightening your belly (abdominal) muscles and holding it as told. Hip-Lift.  Lie on your back with your knees flexed 90 degrees. Push down with your feet and shoulders as you raise your hips 2 inches off the floor. Hold for 10 seconds, repeat 5 to 10 times. Back Arches.  Lie on your stomach. Prop yourself up on bent elbows. Slowly press on your hands, causing an arch in your low back. Repeat 3 to 5 times. Shoulder-Lifts.  Lie face down with arms beside your body. Keep hips and belly pressed to floor as you slowly lift your head and shoulders off the  floor. Do not overdo your exercises. Be careful in the beginning. Exercises may cause you some mild back discomfort. If the pain lasts for more than 15 minutes, stop the exercises until you see your doctor. Improvement with exercise for back problems is slow.  Document Released: 07/24/2010 Document Revised: 09/13/2011 Document Reviewed: 04/22/2011 [facility] Patient Information 2015 [place], [place]. This information is not intended to replace advice given to you by your health care provider. Make sure you discuss any questions you have with your health care provider.

## 2014-09-20 NOTE — ED Provider Notes (Signed)
CSN: [id number]     Arrival date & time 09/20/14  1510 History  This chart was scribed for Levi StraussMercedes Camprubi-Soms, PA-C, working with Eber HongBrian Miller, MD by Chestine SporeSoijett Blue, ED Scribe. The patient was seen in room WTR9/WTR9 at 4:55 PM.    Chief Complaint  Patient presents with  . Back Pain      Patient is a 24 y.o. female presenting with back pain. The history is provided by the patient. No language interpreter was used.  Back Pain Location:  Lumbar spine Quality:  Aching (throbbing) Radiates to:  Does not radiate Pain severity:  Severe Pain is:  Same all the time Onset quality:  Gradual Duration:  3 days Timing:  Constant Progression:  Unchanged Chronicity:  New Context: lifting heavy objects   Context: not falling, not occupational injury, not recent injury and not twisting   Relieved by:  Nothing Worsened by:  Standing Ineffective treatments:  NSAIDs Associated symptoms: no abdominal pain, no bladder incontinence, no bowel incontinence, no chest pain, no dysuria, no fever, no headaches, no leg pain, no numbness, no pelvic pain, no perianal numbness, no tingling and no weakness   Risk factors: obesity   Risk factors: no hx of cancer    HPI Comments: [name] [name] is a 24 y.o. female with a medical hx of DM and POCS who presents to the Emergency Department complaining of right sided constant lumbar back pain onset 3 days. The back pain is worsened with standing for long periods of time. She describes the pain as aching and throbbing and non-radiating. Pt rates the back pain as 9/10. Pt denies heavy lifting, although the pt has a child that weighs 21 lbs. Denies any trauma or twisting. Pt denies back pain or back issues. She states that she has tried Aleve with mild relief for her symptoms. She denies fevers, chills, CP, SOB, abdominal pain, n/v/d/c, dysuria, hematuria, vaginal bleeding/discharge, arthralgias, numbness, tingling, weakness, cauda equina symptoms, bladder incontinence, and  any other symptoms. Denies hx of CA or IV drug use. Currently on menses. Pt has never had back pain while she was on her period. Pt does not have a PCP that she sees normally. Pt is not currently breast-feeding.   Patient's last menstrual period was 09/20/2014 (exact date).     Past Medical History  Diagnosis Date  . Diabetes mellitus   . Gonorrhea   . PCOS (polycystic ovarian syndrome)   . Cushing syndrome   . Hirsutism   . 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 Abscess drainage    . Tooth extraction     Family History  Problem Relation Age of Onset  . COPD Maternal Grandmother   . Hypertension Maternal Grandmother   . Heart disease Maternal Grandmother   . Epilepsy Father   . Cancer Father     lung cancer  . Cancer Paternal Grandfather    History  Substance Use Topics  . Smoking status: Light Tobacco Smoker -- 0.10 packs/day    Types: Cigarettes    Last Attempt to Quit: 12/13/2012  . Smokeless tobacco: Never Used  . Alcohol Use: 0.0 oz/week    0 Standard drinks or equivalent per week     Comment: socially   OB History    Gravida Para Term Preterm AB TAB SAB Ectopic Multiple Living   2 1 1  0 1 0 1 0 0 1     Review of Systems  Constitutional: Negative for  fever.  Respiratory: Negative for shortness of breath.   Cardiovascular: Negative for chest pain.  Gastrointestinal: Negative for nausea, vomiting, abdominal pain, diarrhea, constipation and bowel incontinence.  Genitourinary: Negative for bladder incontinence, dysuria, hematuria, flank pain, vaginal bleeding, vaginal discharge and pelvic pain.       No bladder incontinence  Musculoskeletal: Positive for back pain. Negative for joint swelling, arthralgias, gait problem, neck pain and neck stiffness.  Skin: Negative for color change.  Allergic/Immunologic: Positive for immunocompromised state (diabetic).  Neurological: Negative for tingling, weakness,  numbness and headaches.   . A complete 10 system review of systems was obtained and all systems are negative except as noted in the HPI and PMH.     Allergies  Review of patient's allergies indicates no known allergies.  Home Medications   Prior to Admission medications   Medication Sig Start Date End Date Taking? Authorizing Provider  glucose blood (FREESTYLE LITE) test strip Use as instructed 04/22/14  Yes Azalia Bilis, MD  insulin glargine (LANTUS) 100 UNIT/ML injection Inject 0.15 mLs (15 Units total) into the skin at bedtime. 06/26/14  Yes Doris Cheadle, MD  Lancets (FREESTYLE) lancets Use as instructed 04/22/14  Yes Azalia Bilis, MD  metFORMIN (GLUCOPHAGE) 1000 MG tablet Take 1 tablet (1,000 mg total) by mouth 2 (two) times daily with a meal. 06/26/14  Yes Deepak Advani, MD  naproxen sodium (ANAPROX) 220 MG tablet Take 440 mg by mouth 2 (two) times daily as needed (back pain).   Yes Historical Provider, MD  Vitamin D, Ergocalciferol, (DRISDOL) 50000 UNITS CAPS capsule Take 1 capsule (50,000 Units total) by mouth every 7 (seven) days. Patient not taking: Reported on 09/20/2014 07/09/14   Doris Cheadle, MD   BP 126/75 mmHg  Pulse 84  Temp(Src) 97.9 F (36.6 C) (Oral)  Resp 18  SpO2 100%  LMP 09/20/2014 (Exact Date)  Breastfeeding? No  Physical Exam  Constitutional: She is oriented to person, place, and time. Vital signs are normal. She appears well-developed and well-nourished.  Non-toxic appearance. No distress.  Afebrile, nontoxic, NAD  HENT:  Head: Normocephalic and atraumatic.  Mouth/Throat: Mucous membranes are normal.  Eyes: Conjunctivae and EOM are normal. Right eye exhibits no discharge. Left eye exhibits no discharge.  Neck: Normal range of motion. Neck supple. No spinous process tenderness and no muscular tenderness present. No rigidity. Normal range of motion present.  Cardiovascular: Normal rate and intact distal pulses.   Pulmonary/Chest: Effort normal. No  respiratory distress.  Abdominal: Soft. Normal appearance. She exhibits no distension. There is no tenderness. There is no rigidity, no rebound, no guarding and no CVA tenderness.  Soft, NTND, no r/g/r, no CVA TTP   Musculoskeletal: Normal range of motion.       Lumbar back: She exhibits tenderness and spasm. She exhibits normal range of motion, no bony tenderness, no swelling and no deformity.       Back:  Obese which limits exam Lumbar spine with FROM intact without spinous process TTP, no bony stepoffs or deformities, mild R sided paraspinous muscle TTP with slight muscle spasms. Strength 5/5 in all extremities, sensation grossly intact in all extremities, negative SLR bilaterally, gait steady and nonanalgic. No overlying skin changes. Distal pulses intact  Neurological: She is alert and oriented to person, place, and time. She has normal strength. No sensory deficit. Gait normal.  Skin: Skin is warm, dry and intact. No rash noted.  Psychiatric: She has a normal mood and affect. Her behavior is normal.  Nursing note  and vitals reviewed.   ED Course  Procedures (including critical care time) DIAGNOSTIC STUDIES: Oxygen Saturation is 100% on RA, normal by my interpretation.    COORDINATION OF CARE: 5:03 PM-Discussed treatment plan which includes naprosyn, heat, f/u if the symptoms worsen with pt at bedside and pt agreed to plan.    Labs Review Labs Reviewed - No data to display  Imaging Review No results found.   EKG Interpretation None      MDM   Final diagnoses:  Right-sided low back pain without sciatica  Muscle spasm of back    24 y.o. female here with back pain, some heavy lifting due to having small child. On her feet a lot at work. No specific injury. No red flag s/s of low back pain. No s/s of central cord compression or cauda equina. Lower extremities are neurovascularly intact and patient is ambulating without difficulty. Doubt need for imaging.  Patient was  counseled on back pain precautions and told to do activity as tolerated but do not lift, push, or pull heavy objects more than 10 pounds for the next week. Patient counseled to use ice or heat on back for no longer than 15 minutes every hour.   Rx given for muscle relaxer and counseled on proper use of muscle relaxant medication. Urged patient not to drink alcohol, drive, or perform any other activities that requires focus while taking medication.   Patient urged to follow-up with PCP if pain does not improve with treatment and rest or if pain becomes recurrent. Urged to return with worsening severe pain, loss of bowel or bladder control, trouble walking. The patient verbalizes understanding and agrees with the plan.   I personally performed the services described in this documentation, which was scribed in my presence. The recorded information has been reviewed and is accurate.  BP 126/75 mmHg  Pulse 84  Temp(Src) 97.9 F (36.6 C) (Oral)  Resp 18  SpO2 100%  LMP 09/20/2014 (Exact Date)  Breastfeeding? No  Meds ordered this encounter  Medications  . naproxen (NAPROSYN) tablet 500 mg    Sig:   . naproxen (NAPROSYN) 500 MG tablet    Sig: Take 1 tablet (500 mg total) by mouth 2 (two) times daily as needed for mild pain, moderate pain or headache (TAKE WITH MEALS.).    Dispense:  20 tablet    Refill:  0    Order Specific Question:  Supervising Provider    Answer:  MILLER, BRIAN [[id number]]  . cyclobenzaprine (FLEXERIL) 10 MG tablet    Sig: Take 1 tablet (10 mg total) by mouth 3 (three) times daily as needed for muscle spasms.    Dispense:  15 tablet    Refill:  0    Order Specific Question:  Supervising Provider    Answer:  Eber Hong [[id number]]      Daphne Karrer Camprubi-Soms, PA-C 09/20/14 1733  Eber Hong, MD 09/21/14 1900

## 2014-10-13 ENCOUNTER — Emergency Department (HOSPITAL_COMMUNITY)
Admission: EM | Admit: 2014-10-13 | Discharge: 2014-10-13 | Disposition: A | Discharge status: Home / Self Care | Payer: Medicaid Other | Attending: Emergency Medicine | Admitting: Emergency Medicine

## 2014-10-13 ENCOUNTER — Encounter (HOSPITAL_COMMUNITY): Payer: Self-pay | Admitting: *Deleted

## 2014-10-13 ENCOUNTER — Emergency Department (HOSPITAL_COMMUNITY): Payer: Medicaid Other

## 2014-10-13 DIAGNOSIS — Z79899 Other long term (current) drug therapy: Secondary | ICD-10-CM | POA: Insufficient documentation

## 2014-10-13 DIAGNOSIS — R059 Cough, unspecified: Secondary | ICD-10-CM

## 2014-10-13 DIAGNOSIS — Z794 Long term (current) use of insulin: Secondary | ICD-10-CM | POA: Diagnosis not present

## 2014-10-13 DIAGNOSIS — R05 Cough: Secondary | ICD-10-CM | POA: Diagnosis not present

## 2014-10-13 DIAGNOSIS — Z791 Long term (current) use of non-steroidal anti-inflammatories (NSAID): Secondary | ICD-10-CM | POA: Insufficient documentation

## 2014-10-13 DIAGNOSIS — Z872 Personal history of diseases of the skin and subcutaneous tissue: Secondary | ICD-10-CM | POA: Insufficient documentation

## 2014-10-13 DIAGNOSIS — Z8619 Personal history of other infectious and parasitic diseases: Secondary | ICD-10-CM | POA: Insufficient documentation

## 2014-10-13 DIAGNOSIS — Z8639 Personal history of other endocrine, nutritional and metabolic disease: Secondary | ICD-10-CM | POA: Insufficient documentation

## 2014-10-13 DIAGNOSIS — Z9114 Patient's other noncompliance with medication regimen: Secondary | ICD-10-CM | POA: Diagnosis not present

## 2014-10-13 DIAGNOSIS — E1165 Type 2 diabetes mellitus with hyperglycemia: Secondary | ICD-10-CM | POA: Diagnosis not present

## 2014-10-13 DIAGNOSIS — Z720 Tobacco use: Secondary | ICD-10-CM | POA: Insufficient documentation

## 2014-10-13 DIAGNOSIS — N898 Other specified noninflammatory disorders of vagina: Secondary | ICD-10-CM | POA: Diagnosis present

## 2014-10-13 DIAGNOSIS — R739 Hyperglycemia, unspecified: Secondary | ICD-10-CM

## 2014-10-13 LAB — CBC WITH DIFFERENTIAL/PLATELET
BASOS ABS: 0 10*3/uL (ref 0.0–0.1)
Basophils Relative: 0 % (ref 0–1)
EOS ABS: 0.1 10*3/uL (ref 0.0–0.7)
EOS PCT: 1 % (ref 0–5)
HCT: 38.8 % (ref 36.0–46.0)
Hemoglobin: 12.9 g/dL (ref 12.0–15.0)
LYMPHS ABS: 1.9 10*3/uL (ref 0.7–4.0)
LYMPHS PCT: 24 % (ref 12–46)
MCH: 30.6 pg (ref 26.0–34.0)
MCHC: 33.2 g/dL (ref 30.0–36.0)
MCV: 91.9 fL (ref 78.0–100.0)
MONOS PCT: 7 % (ref 3–12)
Monocytes Absolute: 0.6 10*3/uL (ref 0.1–1.0)
NEUTROS ABS: 5.3 10*3/uL (ref 1.7–7.7)
NEUTROS PCT: 68 % (ref 43–77)
PLATELETS: 205 10*3/uL (ref 150–400)
RBC: 4.22 MIL/uL (ref 3.87–5.11)
RDW: 12.4 % (ref 11.5–15.5)
WBC: 7.9 10*3/uL (ref 4.0–10.5)

## 2014-10-13 LAB — WET PREP, GENITAL
Clue Cells Wet Prep HPF POC: NONE SEEN
TRICH WET PREP: NONE SEEN
Yeast Wet Prep HPF POC: NONE SEEN

## 2014-10-13 LAB — COMPREHENSIVE METABOLIC PANEL
ALK PHOS: 89 U/L (ref 39–117)
ALT: 13 U/L (ref 0–35)
ANION GAP: 11 (ref 5–15)
AST: 20 U/L (ref 0–37)
Albumin: 4.2 g/dL (ref 3.5–5.2)
BUN: 11 mg/dL (ref 6–23)
CALCIUM: 9.1 mg/dL (ref 8.4–10.5)
CO2: 24 mmol/L (ref 19–32)
CREATININE: 0.61 mg/dL (ref 0.50–1.10)
Chloride: 99 mmol/L (ref 96–112)
Glucose, Bld: 379 mg/dL — ABNORMAL HIGH (ref 70–99)
Potassium: 4.2 mmol/L (ref 3.5–5.1)
SODIUM: 134 mmol/L — AB (ref 135–145)
Total Bilirubin: 0.6 mg/dL (ref 0.3–1.2)
Total Protein: 8.3 g/dL (ref 6.0–8.3)

## 2014-10-13 LAB — I-STAT BETA HCG BLOOD, ED (MC, WL, AP ONLY)

## 2014-10-13 LAB — URINE MICROSCOPIC-ADD ON

## 2014-10-13 LAB — URINALYSIS, ROUTINE W REFLEX MICROSCOPIC
BILIRUBIN URINE: NEGATIVE
Hgb urine dipstick: NEGATIVE
Ketones, ur: NEGATIVE mg/dL
Leukocytes, UA: NEGATIVE
Nitrite: NEGATIVE
Protein, ur: NEGATIVE mg/dL
SPECIFIC GRAVITY, URINE: 1.035 — AB (ref 1.005–1.030)
Urobilinogen, UA: 0.2 mg/dL (ref 0.0–1.0)
pH: 6 (ref 5.0–8.0)

## 2014-10-13 LAB — CBG MONITORING, ED
Glucose-Capillary: 392 mg/dL — ABNORMAL HIGH (ref 70–99)
Glucose-Capillary: 289 mg/dL — ABNORMAL HIGH (ref 70–99)

## 2014-10-13 MED ORDER — SODIUM CHLORIDE 0.9 % IV BOLUS (SEPSIS)
1000.0000 mL | Freq: Once | INTRAVENOUS | Status: AC
  Administered 2014-10-13: 1000 mL via INTRAVENOUS

## 2014-10-13 MED ORDER — INSULIN GLARGINE 100 UNIT/ML ~~LOC~~ SOLN: 15.0000 [IU] | Freq: Every day | SUBCUTANEOUS | Status: DC

## 2014-10-13 NOTE — ED Provider Notes (Signed)
CSN: [id number]     Arrival date & time 10/13/14  1800 History   First MD Initiated Contact with Patient 10/13/14 1840     Chief Complaint  Patient presents with  . Hyperglycemia  . Vaginal Discharge     (Consider location/radiation/quality/duration/timing/severity/associated sxs/prior Treatment) HPI  [name] [name] is a 24 y.o. female insulin-dependent diabetic with PCOS , she's been noncompliant with her insulin for greater than 4 weeks, checked her blood sugar this morning and it was around 400. Patient endorses polyuria and polydipsia generalized fatigue. She states she's having a thick white vaginal discharge and perineal itching. Had unprotected sex with female partner one week ago. [facility] but has not been able to get an appointment. She denies fever, chills, nausea, vomiting, chest pain, shortness of breath, abdominal pain, change in bowel habits. On review of systems she endorses an intermittently productive cough.   Past Medical History  Diagnosis Date  . Diabetes mellitus   . Gonorrhea   . PCOS (polycystic ovarian syndrome)   . Cushing syndrome   . Hirsutism   . 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 Abscess drainage    . Tooth extraction     Family History  Problem Relation Age of Onset  . COPD Maternal Grandmother   . Hypertension Maternal Grandmother   . Heart disease Maternal Grandmother   . Epilepsy Father   . Cancer Father     lung cancer  . Cancer Paternal Grandfather    History  Substance Use Topics  . Smoking status: Light Tobacco Smoker -- 0.10 packs/day    Types: Cigarettes    Last Attempt to Quit: 12/13/2012  . Smokeless tobacco: Never Used  . Alcohol Use: 0.0 oz/week    0 Standard drinks or equivalent per week     Comment: socially   OB History    Gravida Para Term Preterm AB TAB SAB Ectopic Multiple Living   0 1 0 1 0 0 1     Review of Systems  10  systems reviewed and found to be negative, except as noted in the HPI.   Allergies  Review of patient's allergies indicates no known allergies.  Home Medications   Prior to Admission medications   Medication Sig Start Date End Date Taking? Authorizing Provider  glucose blood (FREESTYLE LITE) test strip Use as instructed 04/22/14  Yes Azalia Bilis, MD  Lancets (FREESTYLE) lancets Use as instructed 04/22/14  Yes Azalia Bilis, MD  naproxen (NAPROSYN) 500 MG tablet Take 1 tablet (500 mg total) by mouth 2 (two) times daily as needed for mild pain, moderate pain or headache (TAKE WITH MEALS.). 09/20/14  Yes Mercedes Camprubi-Soms, PA-C  cyclobenzaprine (FLEXERIL) 10 MG tablet Take 1 tablet (10 mg total) by mouth 3 (three) times daily as needed for muscle spasms. Patient not taking: Reported on 10/13/2014 09/20/14   Mercedes Camprubi-Soms, PA-C  insulin glargine (LANTUS) 100 UNIT/ML injection Inject 0.15 mLs (15 Units total) into the skin at bedtime. 10/13/14   Deaunna Olarte, PA-C  metFORMIN (GLUCOPHAGE) 1000 MG tablet Take 1 tablet (1,000 mg total) by mouth 2 (two) times daily with a meal. Patient not taking: Reported on 10/13/2014 06/26/14   Doris Cheadle, MD  Vitamin D, Ergocalciferol, (DRISDOL) 50000 UNITS CAPS capsule Take 1 capsule (50,000 Units total) by mouth every 7 (seven) days. Patient not taking: Reported on 09/20/2014 07/09/14   Doris Cheadle, MD  BP 117/75 mmHg  Pulse 73  Temp(Src) 98.3 F (36.8 C) (Oral)  Resp 16  Ht 5\' 6"  (1.676 m)  Wt 290 lb (131.543 kg)  BMI 46.83 kg/m2  SpO2 100%  LMP 08/22/2014 Physical Exam  Constitutional: She is oriented to person, place, and time. She appears well-developed and well-nourished. No distress.  HENT:  Head: Normocephalic and atraumatic.  Mouth/Throat: Oropharynx is clear and moist.  Eyes: Conjunctivae and EOM are normal. Pupils are equal, round, and reactive to light.  Neck: Normal range of motion.  Cardiovascular: Normal rate, regular  rhythm and intact distal pulses.   Pulmonary/Chest: Effort normal and breath sounds normal. No stridor. No respiratory distress. She has no wheezes. She has no rales. She exhibits no tenderness.  Abdominal: Soft. Bowel sounds are normal. She exhibits no distension and no mass. There is no tenderness. There is no rebound and no guarding.  Genitourinary:  Topic exam is chaperoned by technician: No rashes or lesions, no abnormal vaginal discharge or cervical motion or adnexal tenderness.  Musculoskeletal: Normal range of motion. She exhibits no edema or tenderness.  Neurological: She is alert and oriented to person, place, and time.  Psychiatric: She has a normal mood and affect.  Nursing note and vitals reviewed.   ED Course  Procedures (including critical care time) Labs Review Labs Reviewed  WET PREP, GENITAL - Abnormal; Notable for the following:    WBC, Wet Prep HPF POC FEW (*)    All other components within normal limits  COMPREHENSIVE METABOLIC PANEL - Abnormal; Notable for the following:    Sodium 134 (*)    Glucose, Bld 379 (*)    All other components within normal limits  URINALYSIS, ROUTINE W REFLEX MICROSCOPIC - Abnormal; Notable for the following:    Specific Gravity, Urine 1.035 (*)    Glucose, UA >1000 (*)    All other components within normal limits  CBG MONITORING, ED - Abnormal; Notable for the following:    Glucose-Capillary 392 (*)    All other components within normal limits  CBG MONITORING, ED - Abnormal; Notable for the following:    Glucose-Capillary 289 (*)    All other components within normal limits  CBC WITH DIFFERENTIAL/PLATELET  URINE MICROSCOPIC-ADD ON  RPR  HIV ANTIBODY (ROUTINE TESTING)  I-STAT BETA HCG BLOOD, ED ([facility], [facility], [facility] ONLY)  GC/CHLAMYDIA PROBE AMP ([facility])    Imaging Review Dg Chest 2 View  10/13/2014   CLINICAL DATA:  Slight cough. Hyperglycemia, history of diabetes. Smoker. Cushing's syndrome.  EXAM: CHEST  2 VIEW  COMPARISON:   None.  FINDINGS: Cardiomediastinal silhouette is unremarkable for this low inspiratory examination with crowded vasculature markings. The lungs are clear without pleural effusions or focal consolidations. Trachea projects midline and there is no pneumothorax. Included soft tissue planes and osseous structures are non-suspicious. Large body habitus. Mild thoracolumbar dextroscoliosis.  IMPRESSION: No acute cardiopulmonary process for this low inspiratory examination.   Electronically Signed   By: Awilda Metroourtnay  Bloomer   On: 10/13/2014 20:53     EKG Interpretation None      MDM   Final diagnoses:  Cough  Hyperglycemia without ketosis  Vaginal discharge  Noncompliance with medication regimen    Filed Vitals:   10/13/14 1805 10/13/14 1936 10/13/14 2037  BP: 145/89 130/66 117/75  Pulse: 92 79 73  Temp: 98.3 F (36.8 C) 98.3 F (36.8 C)   TempSrc: Oral    Resp: 16 16 16   Height:  5\' 6"  (1.676 m)  Weight:  290 lb (131.543 kg)   SpO2: 100% 98% 100%    Medications  sodium chloride 0.9 % bolus 1,000 mL (1,000 mLs Intravenous New Bag/Given 10/13/14 1856)  sodium chloride 0.9 % bolus 1,000 mL (0 mLs Intravenous Stopped 10/13/14 2038)    Heide Scales is a pleasant 24 y.o. female presenting with hyperglycemia around 400, noncompliant with her insulin for 1 month. Patient is a reports abnormal vaginal discharge, pelvic exam is unremarkable. Vital signs are stable. Glucose is 392.  Patient's blood glucose is 379 on chemistry however she has a normal anion gap, wet prep is unremarkable. Urinalysis shows only glucose and high specific gravity.  After fluid bolus patient's blood sugar reduced to under 300. With an extensive discussion of how important it is to follow with her primary care at the [facility] center and be compliant with her insulin.  Evaluation does not show pathology that would require ongoing emergent intervention or inpatient treatment. Pt is hemodynamically stable and mentating  appropriately. Discussed findings and plan with patient/guardian, who agrees with care plan. All questions answered. Return precautions discussed and outpatient follow up given.   New Prescriptions   INSULIN GLARGINE (LANTUS) 100 UNIT/ML INJECTION    Inject 0.15 mLs (15 Units total) into the skin at bedtime.         Wynetta Emery, PA-C 10/13/14 2119  Arby Barrette, MD 10/14/14 0000

## 2014-10-13 NOTE — ED Notes (Signed)
Pt reports unprotected sex recently, now having vaginal discharge. Reports hyperglycemia, pt hx of diabetes, but non complaint with medicines, reports she does not have a pcp either. Denies pain.

## 2014-10-13 NOTE — Discharge Instructions (Signed)
Please follow with your primary care doctor in the next 2 days for a check-up. They must obtain records for further management.  ° °Do not hesitate to return to the Emergency Department for any new, worsening or concerning symptoms.  ° ° °Blood Glucose Monitoring °Monitoring your blood glucose (also know as blood sugar) helps you to manage your diabetes. It also helps you and your health care provider monitor your diabetes and determine how well your treatment plan is working. °WHY SHOULD YOU MONITOR YOUR BLOOD GLUCOSE? °· It can help you understand how food, exercise, and medicine affect your blood glucose. °· It allows you to know what your blood glucose is at any given moment. You can quickly tell if you are having low blood glucose (hypoglycemia) or high blood glucose (hyperglycemia). °· It can help you and your health care provider know how to adjust your medicines. °· It can help you understand how to manage an illness or adjust medicine for exercise. °WHEN SHOULD YOU TEST? °Your health care provider will help you decide how often you should check your blood glucose. This may depend on the type of diabetes you have, your diabetes control, or the types of medicines you are taking. Be sure to write down all of your blood glucose readings so that this information can be reviewed with your health care provider. See below for examples of testing times that your health care provider may suggest. °Type 1 Diabetes °· Test 4 times a day if you are in good control, using an insulin pump, or perform multiple daily injections. °· If your diabetes is not well controlled or if you are sick, you may need to monitor more often. °· It is a good idea to also monitor: °¨ Before and after exercise. °¨ Between meals and 2 hours after a meal. °¨ Occasionally between 2:00 a.m. and 3:00 a.m. °Type 2 Diabetes °· It can vary with each person, but generally, if you are on insulin, test 4 times a day. °· If you take medicines by mouth  (orally), test 2 times a day. °· If you are on a controlled diet, test once a day. °· If your diabetes is not well controlled or if you are sick, you may need to monitor more often. °HOW TO MONITOR YOUR BLOOD GLUCOSE °Supplies Needed °· Blood glucose meter. °· Test strips for your meter. Each meter has its own strips. You must use the strips that go with your own meter. °· A pricking needle (lancet). °· A device that holds the lancet (lancing device). °· A journal or log book to write down your results. °Procedure °· Wash your hands with soap and water. Alcohol is not preferred. °· Prick the side of your finger (not the tip) with the lancet. °· Gently milk the finger until a small drop of blood appears. °· Follow the instructions that come with your meter for inserting the test strip, applying blood to the strip, and using your blood glucose meter. °Other Areas to Get Blood for Testing °Some meters allow you to use other areas of your body (other than your finger) to test your blood. These areas are called alternative sites. The most common alternative sites are: °· The forearm. °· The thigh. °· The back area of the lower leg. °· The palm of the hand. °The blood flow in these areas is slower. Therefore, the blood glucose values you get may be delayed, and the numbers are different from what you would get from your   fingers. Do not use alternative sites if you think you are having hypoglycemia. Your reading will not be accurate. Always use a finger if you are having hypoglycemia. Also, if you cannot feel your lows (hypoglycemia unawareness), always use your fingers for your blood glucose checks. °ADDITIONAL TIPS FOR GLUCOSE MONITORING °· Do not reuse lancets. °· Always carry your supplies with you. °· All blood glucose meters have a 24-hour "hotline" number to call if you have questions or need help. °· Adjust (calibrate) your blood glucose meter with a control solution after finishing a few boxes of strips. °BLOOD  GLUCOSE RECORD KEEPING °It is a good idea to keep a daily record or log of your blood glucose readings. Most glucose meters, if not all, keep your glucose records stored in the meter. Some meters come with the ability to download your records to your home computer. Keeping a record of your blood glucose readings is especially helpful if you are wanting to look for patterns. Make notes to go along with the blood glucose readings because you might forget what happened at that exact time. Keeping good records helps you and your health care provider to work together to achieve good diabetes management.  °Document Released: 06/24/2003 Document Revised: 11/05/2013 Document Reviewed: 11/13/2012 °ExitCare® Patient Information ©2015 [employer]. This information is not intended to replace advice given to you by your health care provider. Make sure you discuss any questions you have with your health care provider. ° °

## 2014-10-13 NOTE — ED Notes (Signed)
Phlebotomy at bedside to collect additional labs that could not be obtained through IV access

## 2014-10-14 ENCOUNTER — Emergency Department (HOSPITAL_COMMUNITY)
Admission: EM | Admit: 2014-10-14 | Discharge: 2014-10-14 | Disposition: A | Discharge status: Home / Self Care | Payer: Medicaid Other | Attending: Emergency Medicine | Admitting: Emergency Medicine

## 2014-10-14 ENCOUNTER — Encounter (HOSPITAL_COMMUNITY): Payer: Self-pay | Admitting: Emergency Medicine

## 2014-10-14 DIAGNOSIS — L02214 Cutaneous abscess of groin: Secondary | ICD-10-CM | POA: Diagnosis present

## 2014-10-14 DIAGNOSIS — Z8619 Personal history of other infectious and parasitic diseases: Secondary | ICD-10-CM | POA: Diagnosis not present

## 2014-10-14 DIAGNOSIS — Z794 Long term (current) use of insulin: Secondary | ICD-10-CM | POA: Diagnosis not present

## 2014-10-14 DIAGNOSIS — Z79899 Other long term (current) drug therapy: Secondary | ICD-10-CM | POA: Insufficient documentation

## 2014-10-14 DIAGNOSIS — E119 Type 2 diabetes mellitus without complications: Secondary | ICD-10-CM | POA: Diagnosis not present

## 2014-10-14 DIAGNOSIS — Z720 Tobacco use: Secondary | ICD-10-CM | POA: Diagnosis not present

## 2014-10-14 DIAGNOSIS — Z791 Long term (current) use of non-steroidal anti-inflammatories (NSAID): Secondary | ICD-10-CM | POA: Diagnosis not present

## 2014-10-14 DIAGNOSIS — Z792 Long term (current) use of antibiotics: Secondary | ICD-10-CM | POA: Insufficient documentation

## 2014-10-14 LAB — HIV ANTIBODY (ROUTINE TESTING W REFLEX): HIV Screen 4th Generation wRfx: NONREACTIVE

## 2014-10-14 LAB — GC/CHLAMYDIA PROBE AMP (~~LOC~~) NOT AT ARMC
Chlamydia: NEGATIVE
Neisseria Gonorrhea: NEGATIVE

## 2014-10-14 LAB — RPR: RPR Ser Ql: NONREACTIVE

## 2014-10-14 MED ORDER — CEPHALEXIN 500 MG PO CAPS
500.0000 mg | ORAL_CAPSULE | Freq: Once | ORAL | Status: AC
  Administered 2014-10-14: 500 mg via ORAL
  Filled 2014-10-14: qty 1

## 2014-10-14 MED ORDER — SULFAMETHOXAZOLE-TRIMETHOPRIM 800-160 MG PO TABS: 1.0000 | ORAL_TABLET | Freq: Two times a day (BID) | ORAL | Status: AC

## 2014-10-14 MED ORDER — IBUPROFEN 600 MG PO TABS: 600.0000 mg | ORAL_TABLET | Freq: Four times a day (QID) | ORAL | Status: DC | PRN

## 2014-10-14 MED ORDER — SULFAMETHOXAZOLE-TRIMETHOPRIM 800-160 MG PO TABS
1.0000 | ORAL_TABLET | Freq: Once | ORAL | Status: AC
  Administered 2014-10-14: 1 via ORAL
  Filled 2014-10-14: qty 1

## 2014-10-14 MED ORDER — CEPHALEXIN 500 MG PO CAPS: 500.0000 mg | ORAL_CAPSULE | Freq: Four times a day (QID) | ORAL | Status: DC

## 2014-10-14 NOTE — ED Notes (Signed)
Pt reports L pelvic abscess x 3 days.  No drainage noted per pt.

## 2014-10-14 NOTE — ED Provider Notes (Signed)
CSN: [id number]     Arrival date & time 10/14/14  1947 History  This chart was scribed for non-physician practitioner, Antony Madura, PA-C,working with Doug Sou, MD, by Karle Plumber, ED Scribe. This patient was seen in room WTR5/WTR5 and the patient's care was started at 10:14 PM.  Chief Complaint  Patient presents with  . Abscess   Patient is a 24 y.o. female presenting with abscess. The history is provided by the patient and medical records. No language interpreter was used.  Abscess Associated symptoms: no fever, no nausea and no vomiting     HPI Comments:  [name] [name] is a 24 y.o. obese female who presents to the Emergency Department complaining of an abscess to the left groin that began three days ago. Pt reports increased pain and size. She has not done anything to treat her symptoms. Walking makes the pain worse. Denies alleviating factors. Denies drainage, fever, chills, nausea, vomiting, warmth or red streaking of the area. PMHx of DM, PCOS, Cushing syndrome and hirsutism. Pt does not have a PCP.    Past Medical History  Diagnosis Date  . Diabetes mellitus   . Gonorrhea   . PCOS (polycystic ovarian syndrome)   . Cushing syndrome   . Hirsutism   . 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 Abscess drainage    . Tooth extraction     Family History  Problem Relation Age of Onset  . COPD Maternal Grandmother   . Hypertension Maternal Grandmother   . Heart disease Maternal Grandmother   . Epilepsy Father   . Cancer Father     lung cancer  . Cancer Paternal Grandfather    History  Substance Use Topics  . Smoking status: Light Tobacco Smoker -- 0.10 packs/day    Types: Cigarettes    Last Attempt to Quit: 12/13/2012  . Smokeless tobacco: Never Used  . Alcohol Use: 0.0 oz/week    0 Standard drinks or equivalent per week     Comment: socially   OB History    Gravida Para Term Preterm AB TAB SAB Ectopic  Multiple Living   0 1 0 1 0 0 1      Review of Systems  Constitutional: Negative for fever and chills.  Gastrointestinal: Negative for nausea and vomiting.  Skin: Positive for color change (left groin).  All other systems reviewed and are negative.    Allergies  Review of patient's allergies indicates no known allergies.  Home Medications   Prior to Admission medications   Medication Sig Start Date End Date Taking? Authorizing Provider  glucose blood (FREESTYLE LITE) test strip Use as instructed 04/22/14  Yes Azalia Bilis, MD  insulin glargine (LANTUS) 100 UNIT/ML injection Inject 0.15 mLs (15 Units total) into the skin at bedtime. 10/13/14  Yes Nicole Pisciotta, PA-C  Lancets (FREESTYLE) lancets Use as instructed 04/22/14  Yes Azalia Bilis, MD  cephALEXin (KEFLEX) 500 MG capsule Take 1 capsule (500 mg total) by mouth 4 (four) times daily. 10/14/14   Antony Madura, PA-C  cyclobenzaprine (FLEXERIL) 10 MG tablet Take 1 tablet (10 mg total) by mouth 3 (three) times daily as needed for muscle spasms. Patient not taking: Reported on 10/13/2014 09/20/14   Mercedes Camprubi-Soms, PA-C  ibuprofen (ADVIL,MOTRIN) 600 MG tablet Take 1 tablet (600 mg total) by mouth every 6 (six) hours as needed. 10/14/14   Antony Madura, PA-C  metFORMIN (GLUCOPHAGE) 1000 MG tablet Take 1 tablet (1,000  mg total) by mouth 2 (two) times daily with a meal. Patient not taking: Reported on 10/13/2014 06/26/14   Doris Cheadleeepak Advani, MD  naproxen (NAPROSYN) 500 MG tablet Take 1 tablet (500 mg total) by mouth 2 (two) times daily as needed for mild pain, moderate pain or headache (TAKE WITH MEALS.). 09/20/14   Mercedes Camprubi-Soms, PA-C  sulfamethoxazole-trimethoprim (BACTRIM DS,SEPTRA DS) 800-160 MG per tablet Take 1 tablet by mouth 2 (two) times daily. 10/14/14 10/21/14  Antony MaduraKelly Kenyonna Micek, PA-C  Vitamin D, Ergocalciferol, (DRISDOL) 50000 UNITS CAPS capsule Take 1 capsule (50,000 Units total) by mouth every 7 (seven) days. Patient not  taking: Reported on 09/20/2014 07/09/14   Doris Cheadleeepak Advani, MD   Triage Vitals: BP 117/79 mmHg  Pulse 89  Temp(Src) 98.3 F (36.8 C) (Oral)  Resp 18  SpO2 99%  LMP 08/22/2014  Physical Exam  Constitutional: She is oriented to person, place, and time. She appears well-developed and well-nourished. No distress.  Pleasant. Nontoxic/nonseptic appearing  HENT:  Head: Normocephalic and atraumatic.  Eyes: Conjunctivae and EOM are normal. No scleral icterus.  Neck: Normal range of motion.  Cardiovascular: Normal rate, regular rhythm and intact distal pulses.   Pulmonary/Chest: Effort normal. No respiratory distress.  Respirations even and unlabored  Genitourinary:    There is no rash, tenderness or lesion on the right labia. There is no rash, tenderness or lesion on the left labia.  Exam chaperone present  Musculoskeletal: Normal range of motion.  Neurological: She is alert and oriented to person, place, and time. She exhibits normal muscle tone. Coordination normal.  GCS 15. Patient ambulatory with steady gait.  Skin: Skin is warm and dry. No rash noted. She is not diaphoretic. No erythema. No pallor.  Psychiatric: She has a normal mood and affect. Her behavior is normal.  Nursing note and vitals reviewed.   ED Course  Procedures (including critical care time) DIAGNOSTIC STUDIES: Oxygen Saturation is 99% on RA, normal by my interpretation.   COORDINATION OF CARE: 10:17 PM- Will I & D abscess. Advised pt to do warm water soaks and apply warm compresses 3-4 times daily. Return precautions discussed. Will prescribe antibiotic. Pt verbalizes understanding and agrees to plan.  INCISION AND DRAINAGE PROCEDURE NOTE: Patient identification was confirmed and verbal consent was obtained. This procedure was performed by Antony MaduraKelly Reign Bartnick, PA-C at 10:36 PM. Site: left groin Sterile procedures observed Needle size: 25 G Anesthetic used (type and amt): Lidocaine 2% with Epinephrine (5 mLs) Blade size:  11 Drainage: copious purulence  Complexity: Complex Packing used: none Site anesthetized, incision made over site, wound drained and explored loculations, rinsed with copious amounts of normal saline, wound packed with sterile gauze, covered with dry, sterile dressing.  Pt tolerated procedure well without complications.  Instructions for care discussed verbally and pt provided with additional written instructions for homecare and f/u.  Medications  sulfamethoxazole-trimethoprim (BACTRIM DS,SEPTRA DS) 800-160 MG per tablet 1 tablet (1 tablet Oral Given 10/14/14 2257)  cephALEXin (KEFLEX) capsule 500 mg (500 mg Oral Given 10/14/14 2257)    Labs Review Labs Reviewed - No data to display  Imaging Review Dg Chest 2 View  10/13/2014   CLINICAL DATA:  Slight cough. Hyperglycemia, history of diabetes. Smoker. Cushing's syndrome.  EXAM: CHEST  2 VIEW  COMPARISON:  None.  FINDINGS: Cardiomediastinal silhouette is unremarkable for this low inspiratory examination with crowded vasculature markings. The lungs are clear without pleural effusions or focal consolidations. Trachea projects midline and there is no pneumothorax. Included soft tissue planes  and osseous structures are non-suspicious. Large body habitus. Mild thoracolumbar dextroscoliosis.  IMPRESSION: No acute cardiopulmonary process for this low inspiratory examination.   Electronically Signed   By: Awilda Metro   On: 10/13/2014 20:53     EKG Interpretation None      MDM   Final diagnoses:  Abscess of groin, left    Patient with skin abscess amenable to incision and drainage.  Abscess was not large enough to warrant packing or drain, wound recheck in 2 days. Encouraged home warm soaks and flushing. Mild signs of cellulitis is surrounding skin. Will d/c home with Bactrim and Keflex given location of abscess and diabetes history. Return precautions given. Patient agreeable to plan with no unaddressed concerns.  I personally  performed the services described in this documentation, which was scribed in my presence. The recorded information has been reviewed and is accurate.   Filed Vitals:   10/14/14 2038  BP: 117/79  Pulse: 89  Temp: 98.3 F (36.8 C)  TempSrc: Oral  Resp: 18  SpO2: 99%     Antony Madura, PA-C 10/14/14 2328  Doug Sou, MD 10/14/14 2351

## 2014-10-14 NOTE — Discharge Instructions (Signed)
Perform warm soaks or wet compresses 3-4 times per day. Take Bactrim and Keflex as prescribed. Follow-up with a primary doctor for a recheck of symptoms in 48 hours, especially if symptoms persist or worsen. Take ibuprofen as needed for pain.  Abscess Care After An abscess (also called a boil or furuncle) is an infected area that contains a collection of pus. Signs and symptoms of an abscess include pain, tenderness, redness, or hardness, or you may feel a moveable soft area under your skin. An abscess can occur anywhere in the body. The infection may spread to surrounding tissues causing cellulitis. A cut (incision) by the surgeon was made over your abscess and the pus was drained out. Gauze may have been packed into the space to provide a drain that will allow the cavity to heal from the inside outwards. The boil may be painful for 5 to 7 days. Most people with a boil do not have high fevers. Your abscess, if seen early, may not have localized, and may not have been lanced. If not, another appointment may be required for this if it does not get better on its own or with medications. HOME CARE INSTRUCTIONS   Only take over-the-counter or prescription medicines for pain, discomfort, or fever as directed by your caregiver.  When you bathe, soak and then remove gauze or iodoform packs at least daily or as directed by your caregiver. You may then wash the wound gently with mild soapy water. Repack with gauze or do as your caregiver directs. SEEK IMMEDIATE MEDICAL CARE IF:   You develop increased pain, swelling, redness, drainage, or bleeding in the wound site.  You develop signs of generalized infection including muscle aches, chills, fever, or a general ill feeling.  An oral temperature above 102 F (38.9 C) develops, not controlled by medication. See your caregiver for a recheck if you develop any of the symptoms described above. If medications (antibiotics) were prescribed, take them as  directed. Document Released: 01/07/2005 Document Revised: 09/13/2011 Document Reviewed: 09/04/2007 [facility]CExit[facility] Patient Information 2015 [place], [place]. This information is not intended to replace advice given to you by your health care provider. Make sure you discuss any questions you have with your health care provider.

## 2014-10-14 NOTE — ED Notes (Signed)
Pt c/o boil in L groin x 3 days. Pt c/o pain with sitting and walking. Pt A&Ox4 and ambulatory. Denies fever, N/V.

## 2015-03-16 IMAGING — US US OB FOLLOW-UP
1 series · 12 of 28 positions shown · non-contrast
Comparison: none

[Series 1: us ob follow-up · 0.16mm/px · 12 of 40 slices shown]
[im 2/40]
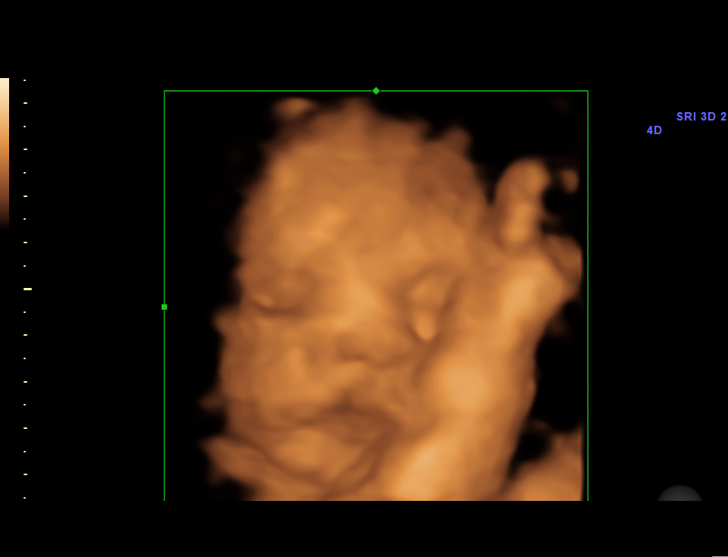
[im 5/40]
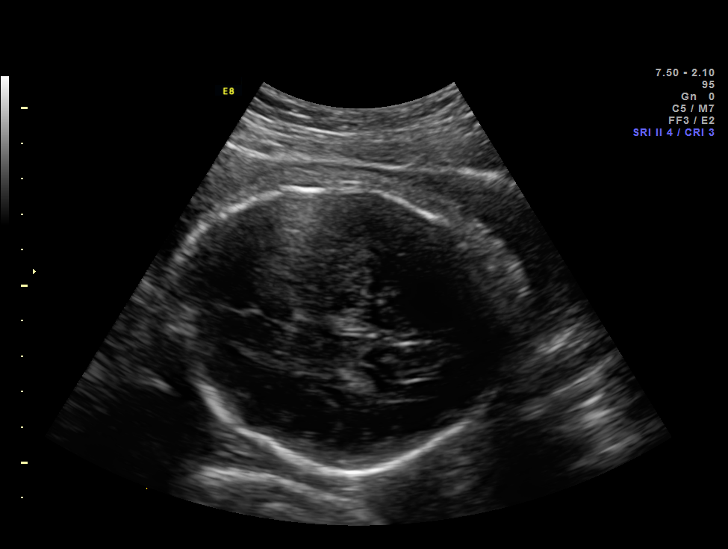
[im 8/40]
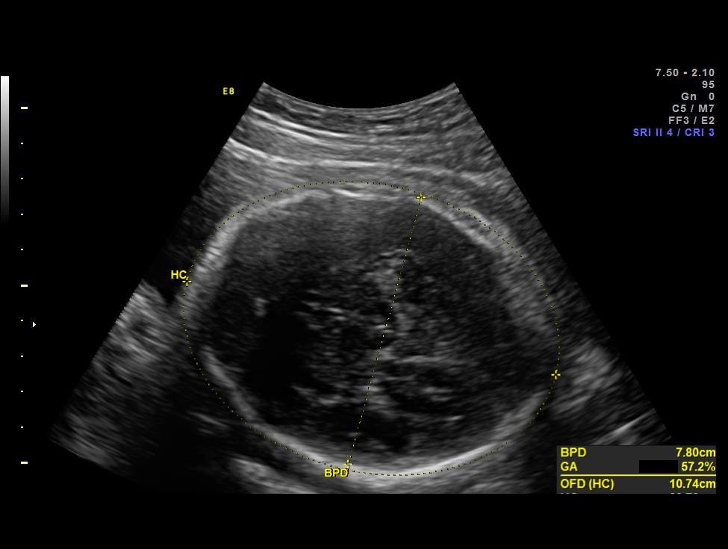
[im 12/40]
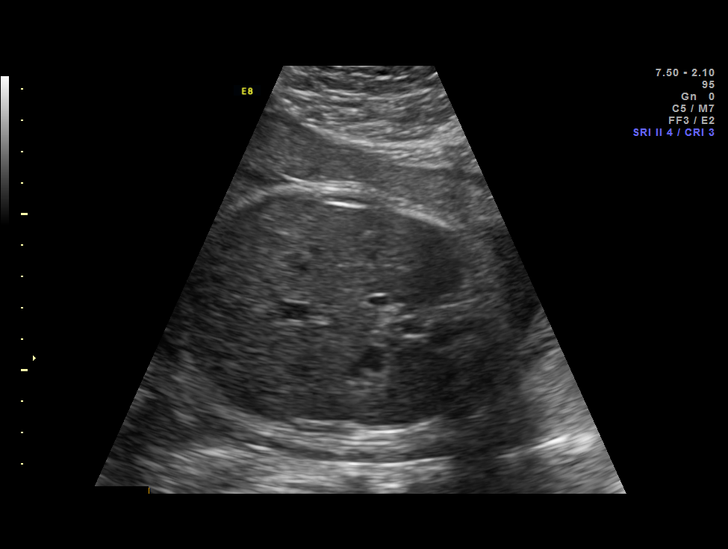
[im 15/40]
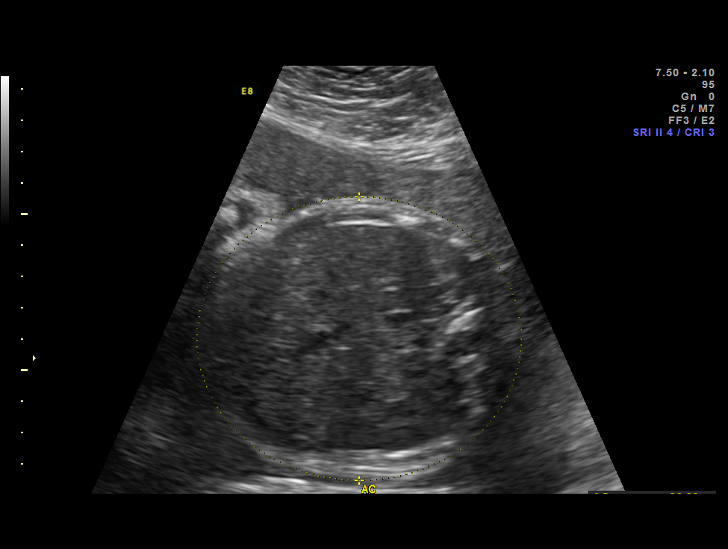
[im 18/40]
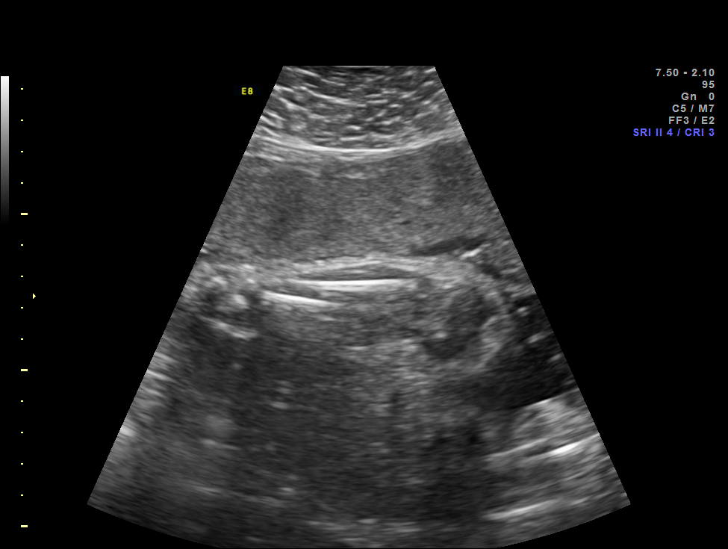
[im 22/40]
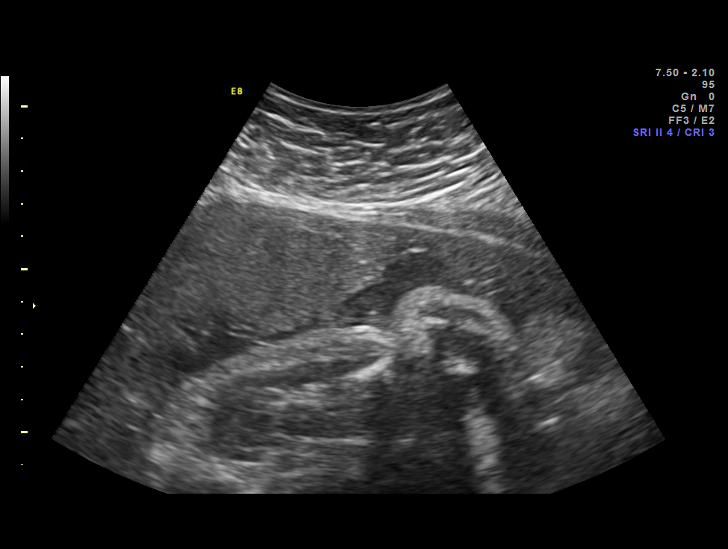
[im 25/40]
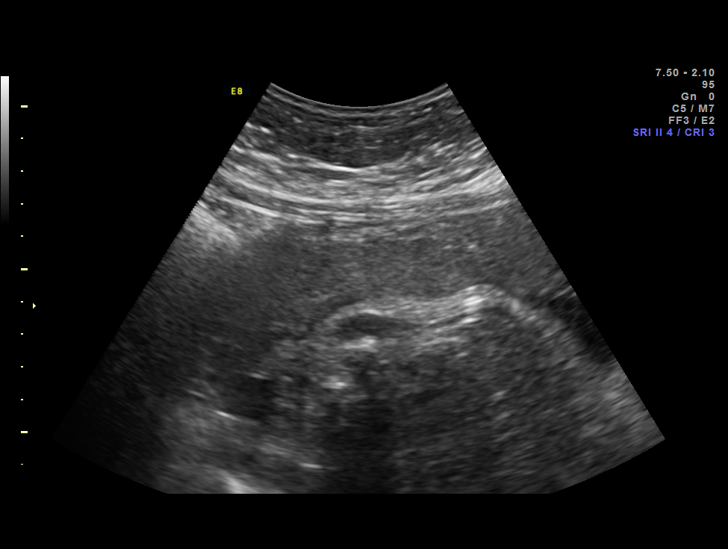
[im 28/40]
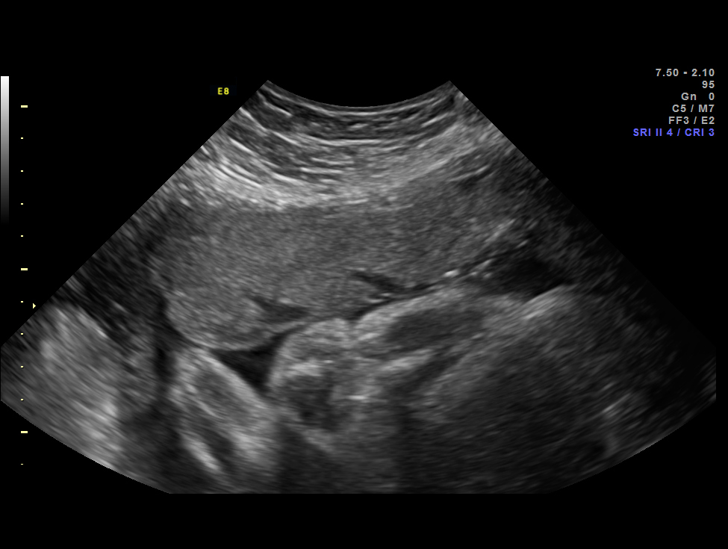
[im 32/40]
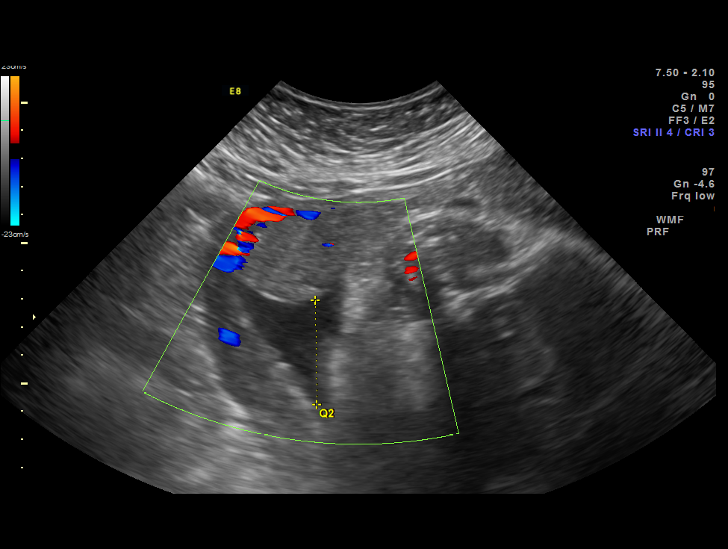
[im 35/40]
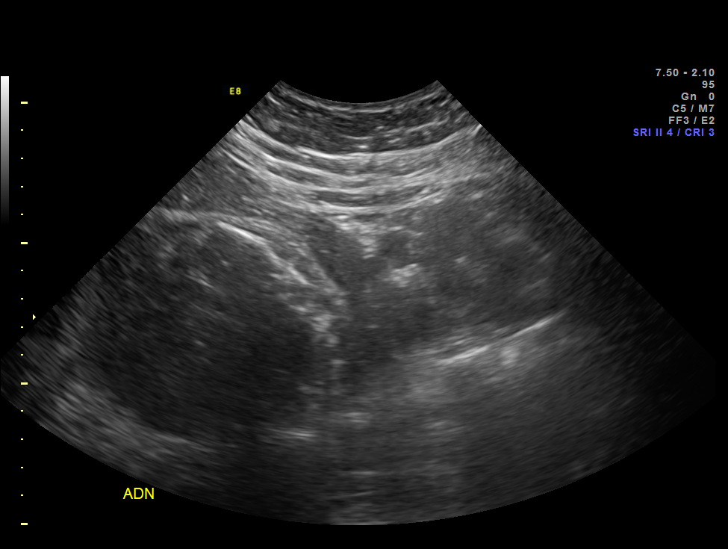
[im 38/40]
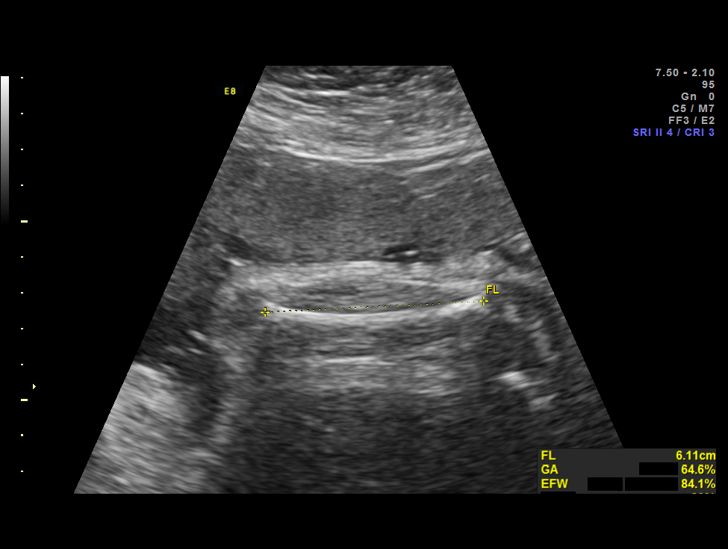

[12 of 28 positions shown; findings below may reference images not displayed]

OBSTETRICS REPORT
                      (Signed Final 05/21/2013 [DATE])

                                                         Faculty Physician
Service(s) Provided

 US OB FOLLOW UP                                       76816.1
Indications

 Diabetes - Pregestational; type 2 on Metformin and
 Humalog; (normal ECHO)
 Polycystic ovarian syndrome
 Cushing syndrome
 Maternal obesity (306 lb)
Fetal Evaluation

 Num Of Fetuses:    1
 Fetal Heart Rate:  148                          bpm
 Cardiac Activity:  Observed
 Presentation:      Cephalic
 Placenta:          Anterior, above cervical os
 P. Cord            Previously Visualized
 Insertion:

 Amniotic Fluid
 AFI FV:      Subjectively within normal limits
 AFI Sum:     14.34   cm       49  %Tile     Larg Pckt:    3.93  cm
 RUQ:   3.93    cm   RLQ:    3.82   cm    LUQ:   3.72    cm   LLQ:    2.87   cm
Biometry

 BPD:     77.3  mm     G. Age:  31w 0d                CI:         72.8   70 - 86
 OFD:    106.2  mm                                    FL/HC:      20.4   19.3 -

 HC:     294.8  mm     G. Age:  32w 4d       66  %    HC/AC:      0.98   0.96 -

 AC:     301.4  mm     G. Age:  34w 1d     > 97  %    FL/BPD:     77.9   71 - 87
 FL:      60.2  mm     G. Age:  31w 2d       53  %    FL/AC:      20.0   20 - 24

 Est. FW:    8041  gm      4 lb 9 oz     84  %
Gestational Age

 LMP:           32w 5d        Date:  10/04/12                 EDD:   07/11/13
 U/S Today:     32w 2d                                        EDD:   07/14/13
 Best:          30w 5d     Det. By:  Early Ultrasound         EDD:   07/25/13
                                     (12/16/12)
Anatomy

 Cranium:          Appears normal         Aortic Arch:      Previously seen
 Fetal Cavum:      Previously seen        Ductal Arch:      Previously seen
 Ventricles:       Appears normal         Diaphragm:        Previously seen
 Choroid Plexus:   Previously seen        Stomach:          Appears normal
 Cerebellum:       Previously seen        Abdomen:          Appears normal
 Posterior Fossa:  Previously seen        Abdominal Wall:   Previously seen
 Nuchal Fold:      Previously seen        Cord Vessels:     Previously seen
 Face:             Orbits previously      Kidneys:          Appear normal
                   seen
 Lips:             Previously seen        Bladder:          Appears normal
 Heart:            Previously seen        Spine:            Previously seen
 RVOT:             Previously seen        Lower             Previously seen
                                          Extremities:
 LVOT:             Previously seen        Upper             Previously seen
                                          Extremities:

 Other:  Heels and 5th digit previously visualized. Nasal bone visualized.
         Technically difficult due to maternal habitus and fetal position.
Impression

 IUP at 30+5 weeks
 Normal interval anatomy; anatomic survey complete
 Normal amniotic fluid volume
 Appropriate interval growth with EFW at the 84th %tile; AC >
 97th %tile
Recommendations

 Follow-up ultrasound for growth in 4 weeks

 questions or concerns.

## 2015-03-27 IMAGING — US US FETAL BPP W/O NONSTRESS
1 series · 10 of 10 positions shown · non-contrast
Comparison: none

[Series 1: us fetal bpp w/o nonstress · non-contrast · 10 acquisitions, 10 frames shown]
[im 1/10]
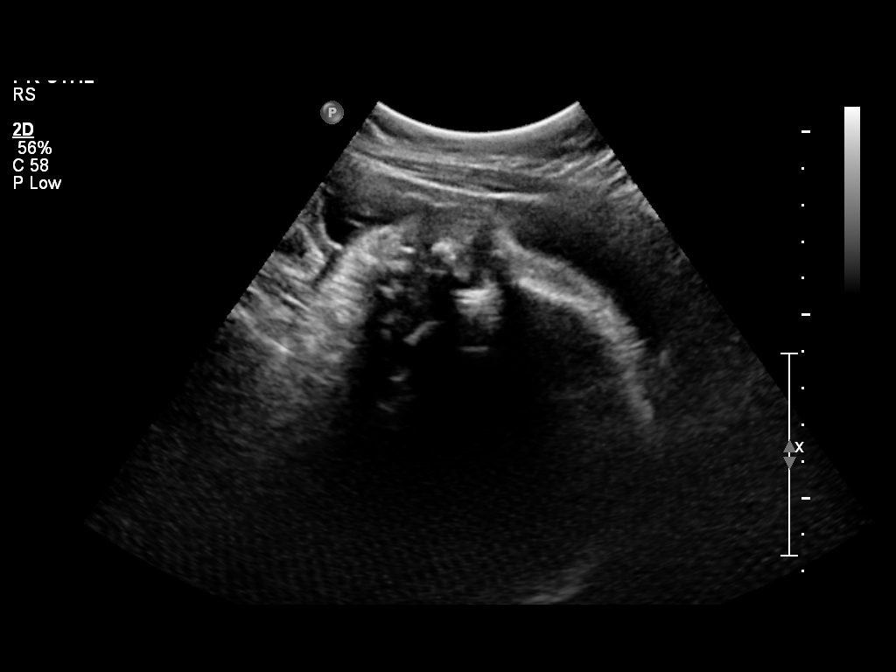
[im 2/10]
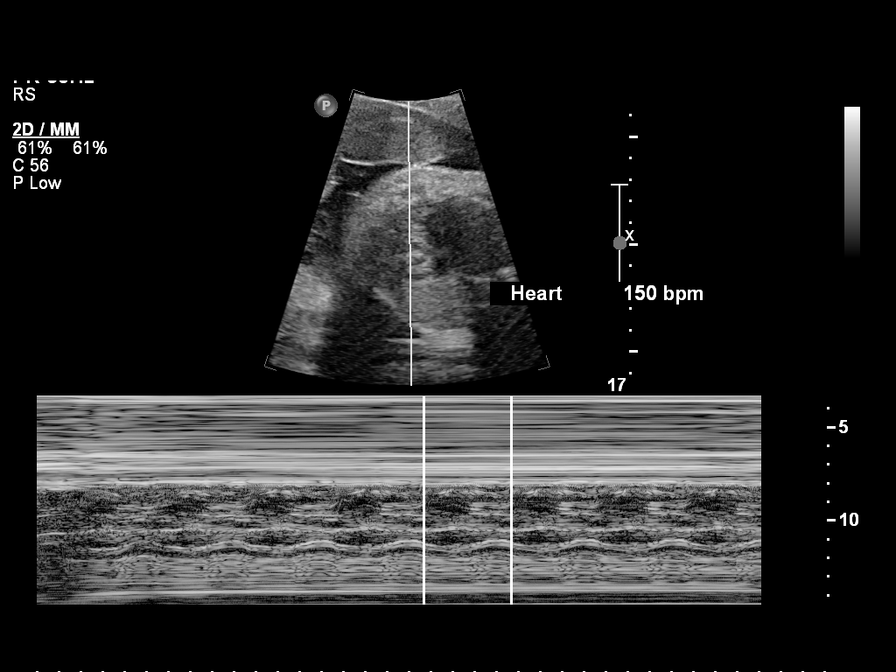
[im 3/10]
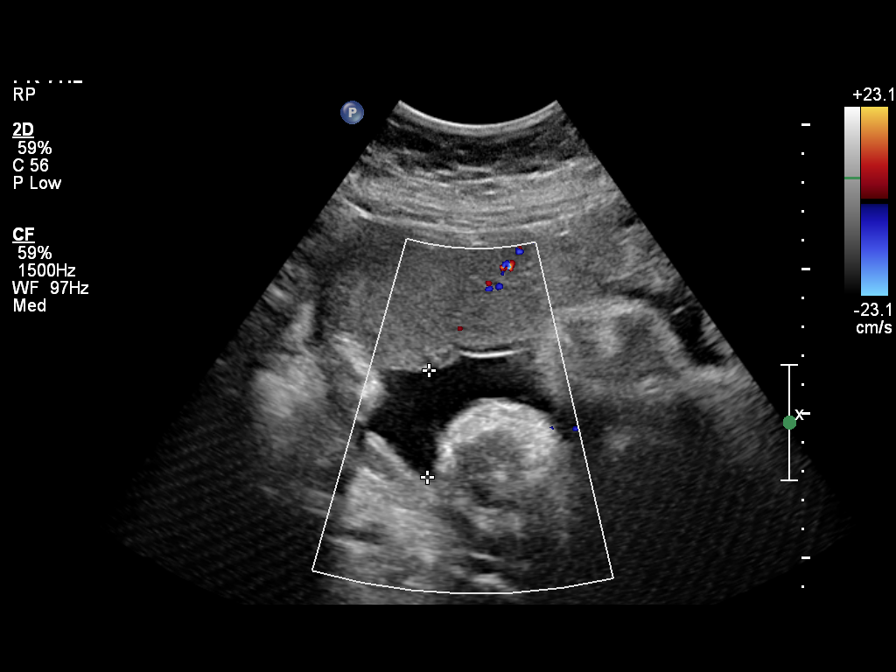
[im 4/10]
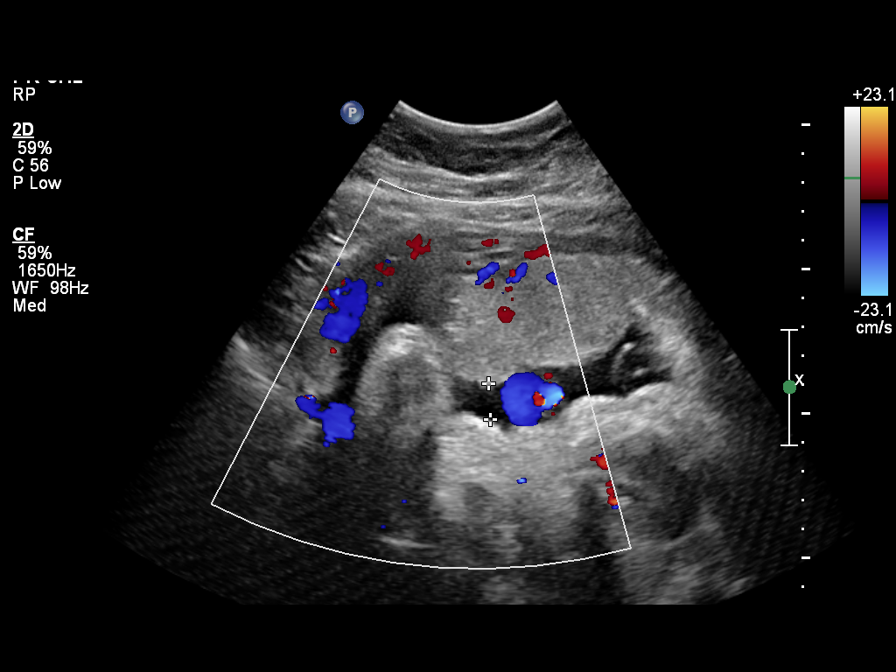
[im 5/10]
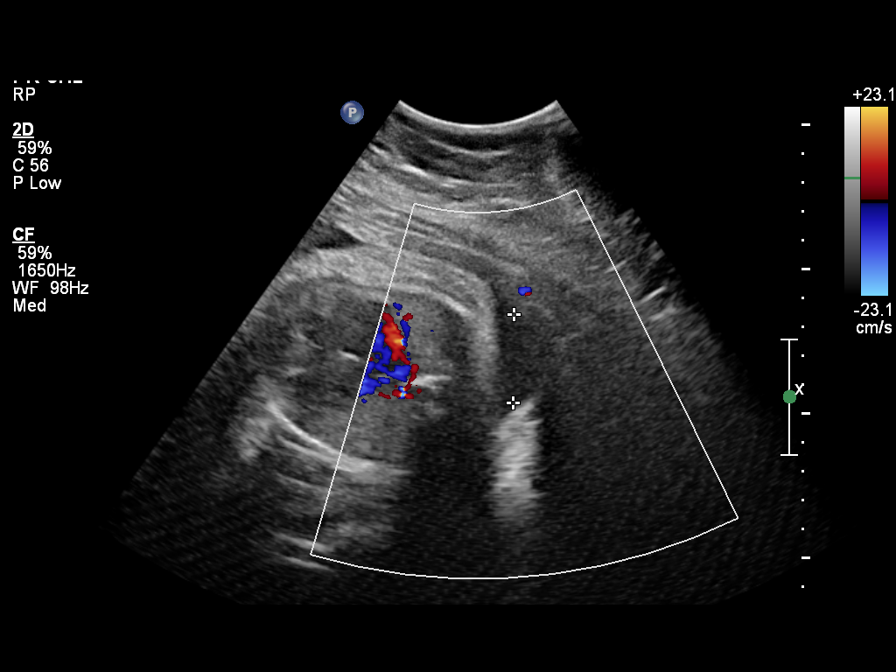
[im 6/10]
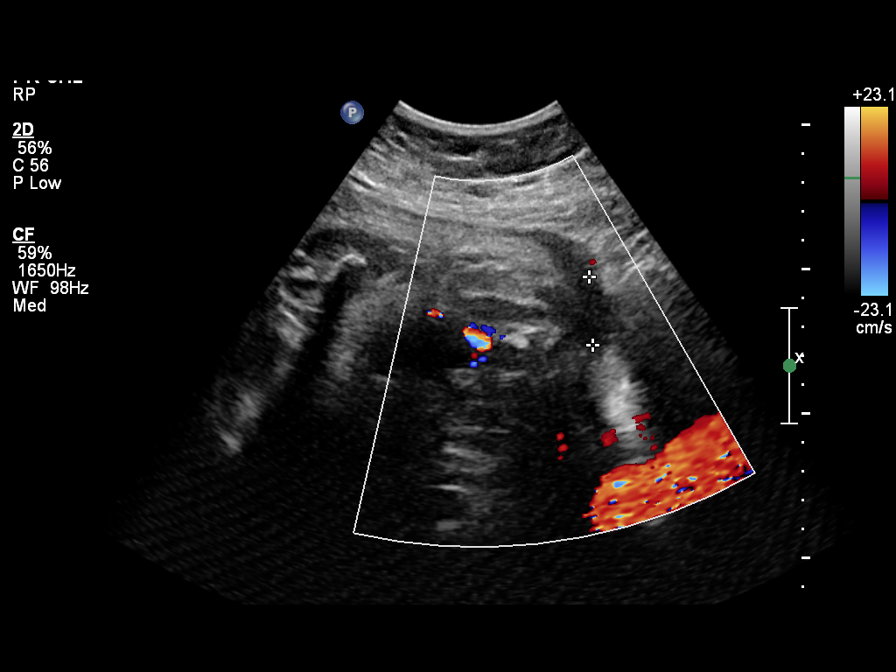
[im 7/10]
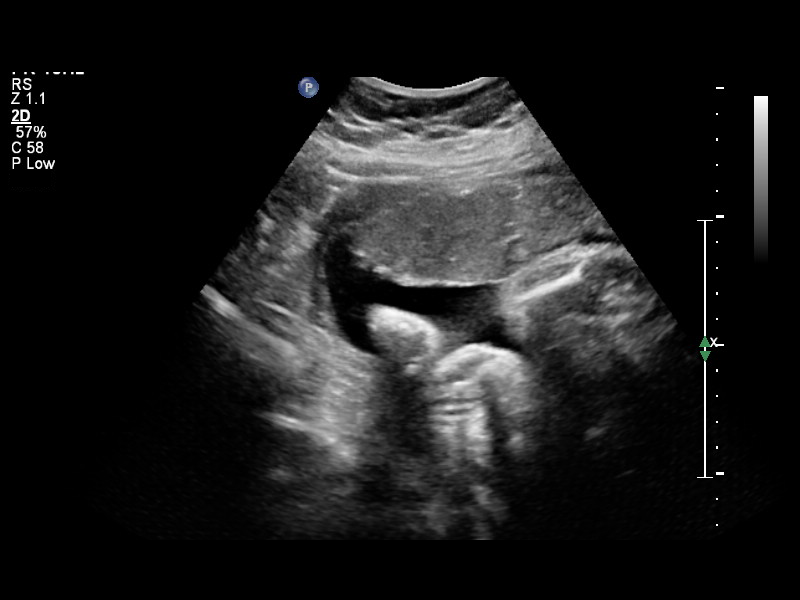
[im 8/10]
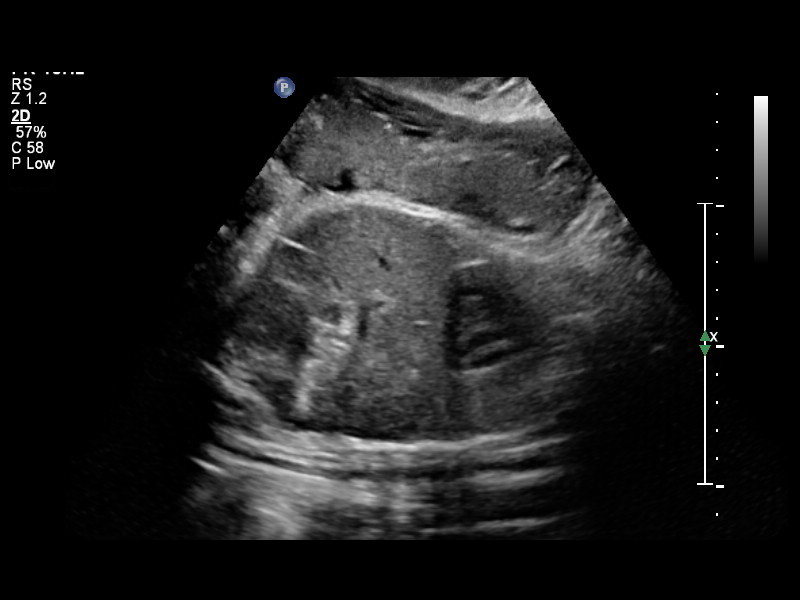
[im 9/10]
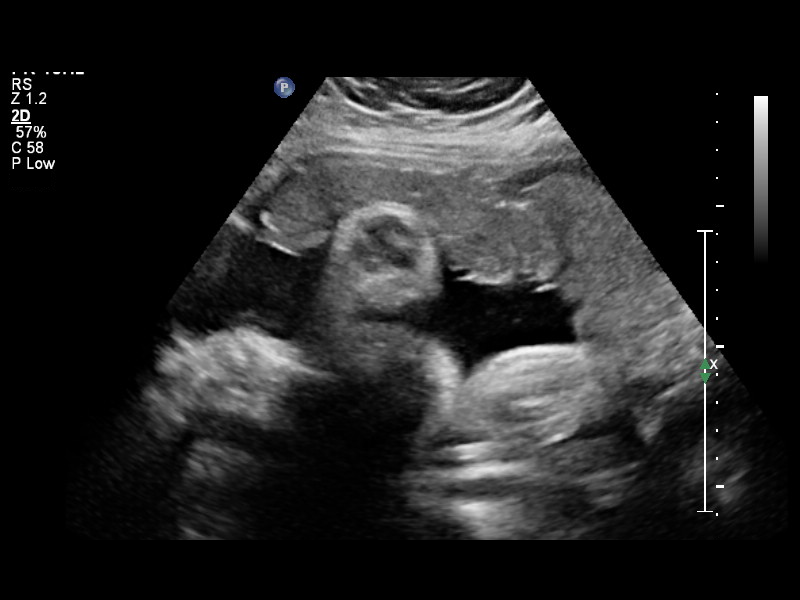
[im 10/10]
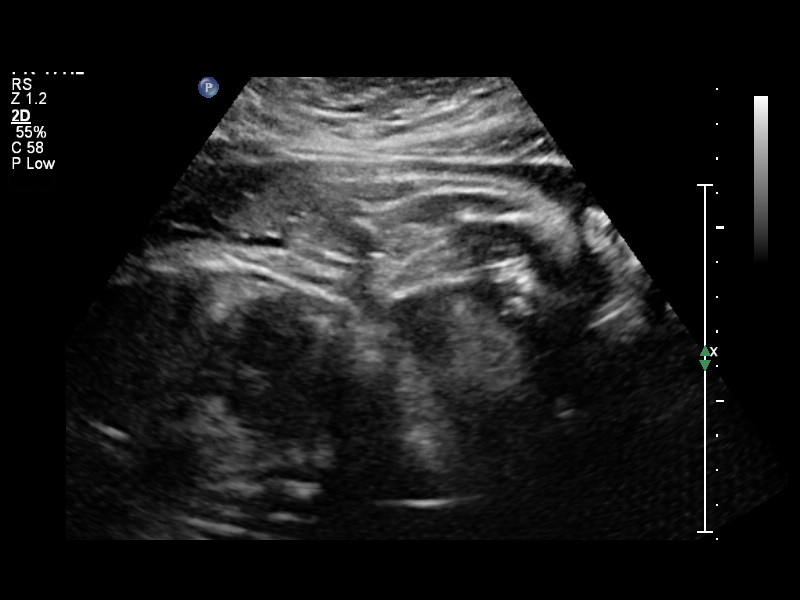

[10 of 10 positions shown; findings below may reference images not displayed]

OBSTETRICS REPORT
                      (Signed Final 06/02/2013 [DATE])

Service(s) Provided

Indications

 Variables on NST
 Diabetes - Pregestational; type 2 on Metformin and
 Humalog; (normal ECHO)
 Polycystic ovarian syndrome
 Cushing syndrome
 Maternal obesity (306 lb)
Fetal Evaluation

 Num Of Fetuses:    1
 Fetal Heart Rate:  150                          bpm
 Cardiac Activity:  Observed
 Presentation:      Cephalic

 Comment:    BPP [DATE] in 14 minutes.

 Amniotic Fluid
 AFI FV:      Subjectively within normal limits
 AFI Sum:     10.34   cm       19  %Tile     Larg Pckt:    3.69  cm
 RUQ:   3.69    cm   RLQ:    1.24   cm    LUQ:   2.36    cm   LLQ:    3.05   cm
Biophysical Evaluation

 Amniotic F.V:   Pocket => 2 cm two         F. Tone:         Observed
                 planes
 F. Movement:    Observed                   Score:           [DATE]
 F. Breathing:   Observed
Gestational Age

 LMP:           34w 2d        Date:  10/04/12                 EDD:   07/11/13
 Best:          32w 2d     Det. By:  Early Ultrasound         EDD:   07/25/13
                                     (12/16/12)
Impression

 Single IUP at 32 [DATE] weeks
 Active fetus with BPP of [DATE]
 Normal amniotic fluid volume
Recommendations

 Continue 2x weekly NSTs with weekly AFIs
 Follow up ultrasounds for fetal growth as previously
 scheduled.

 questions or concerns.

## 2015-06-17 ENCOUNTER — Encounter: Payer: Self-pay | Admitting: Certified Nurse Midwife

## 2015-06-17 ENCOUNTER — Ambulatory Visit (INDEPENDENT_AMBULATORY_CARE_PROVIDER_SITE_OTHER): Payer: Medicaid Other | Admitting: Certified Nurse Midwife

## 2015-06-17 VITALS — BP 113/77 | HR 78 | Ht 66.0 in | Wt 288.0 lb

## 2015-06-17 DIAGNOSIS — Z01419 Encounter for gynecological examination (general) (routine) without abnormal findings: Secondary | ICD-10-CM

## 2015-06-17 DIAGNOSIS — Z0000 Encounter for general adult medical examination without abnormal findings: Secondary | ICD-10-CM | POA: Diagnosis not present

## 2015-06-17 DIAGNOSIS — E669 Obesity, unspecified: Secondary | ICD-10-CM

## 2015-06-17 DIAGNOSIS — Z113 Encounter for screening for infections with a predominantly sexual mode of transmission: Secondary | ICD-10-CM

## 2015-06-17 DIAGNOSIS — Z8742 Personal history of other diseases of the female genital tract: Secondary | ICD-10-CM

## 2015-06-17 LAB — CBC WITH DIFFERENTIAL/PLATELET
Basophils Absolute: 0 10*3/uL (ref 0.0–0.1)
Basophils Relative: 0 % (ref 0–1)
Eosinophils Absolute: 0.1 10*3/uL (ref 0.0–0.7)
Eosinophils Relative: 2 % (ref 0–5)
HEMATOCRIT: 41.6 % (ref 36.0–46.0)
HEMOGLOBIN: 14 g/dL (ref 12.0–15.0)
LYMPHS PCT: 33 % (ref 12–46)
Lymphs Abs: 1.9 10*3/uL (ref 0.7–4.0)
MCH: 31 pg (ref 26.0–34.0)
MCHC: 33.7 g/dL (ref 30.0–36.0)
MCV: 92.2 fL (ref 78.0–100.0)
MONO ABS: 0.4 10*3/uL (ref 0.1–1.0)
MPV: 12.3 fL (ref 8.6–12.4)
Monocytes Relative: 7 % (ref 3–12)
NEUTROS ABS: 3.4 10*3/uL (ref 1.7–7.7)
Neutrophils Relative %: 58 % (ref 43–77)
Platelets: 195 10*3/uL (ref 150–400)
RBC: 4.51 MIL/uL (ref 3.87–5.11)
RDW: 13 % (ref 11.5–15.5)
WBC: 5.8 10*3/uL (ref 4.0–10.5)

## 2015-06-17 LAB — COMPREHENSIVE METABOLIC PANEL
ALBUMIN: 4 g/dL (ref 3.6–5.1)
ALK PHOS: 77 U/L (ref 33–115)
ALT: 8 U/L (ref 6–29)
AST: 9 U/L — ABNORMAL LOW (ref 10–30)
BUN: 7 mg/dL (ref 7–25)
CALCIUM: 9.3 mg/dL (ref 8.6–10.2)
CO2: 27 mmol/L (ref 20–31)
Chloride: 98 mmol/L (ref 98–110)
Creat: 0.64 mg/dL (ref 0.50–1.10)
Glucose, Bld: 295 mg/dL — ABNORMAL HIGH (ref 65–99)
POTASSIUM: 3.9 mmol/L (ref 3.5–5.3)
Sodium: 137 mmol/L (ref 135–146)
Total Bilirubin: 0.8 mg/dL (ref 0.2–1.2)
Total Protein: 7.2 g/dL (ref 6.1–8.1)

## 2015-06-17 LAB — TSH: TSH: 1.733 u[IU]/mL (ref 0.350–4.500)

## 2015-06-17 MED ORDER — NORGESTIMATE-ETH ESTRADIOL 0.25-35 MG-MCG PO TABS: 1.0000 | ORAL_TABLET | Freq: Every day | ORAL | Status: DC

## 2015-06-17 MED ORDER — VITAFOL ULTRA 29-0.6-0.4-200 MG PO CAPS: 1.0000 | ORAL_CAPSULE | Freq: Every day | ORAL | Status: DC

## 2015-06-17 MED ORDER — TERCONAZOLE 0.4 % VA CREA: 1.0000 | TOPICAL_CREAM | Freq: Every day | VAGINAL | Status: DC

## 2015-06-17 MED ORDER — SPIRONOLACTONE 50 MG PO TABS: 50.0000 mg | ORAL_TABLET | Freq: Every day | ORAL | Status: DC

## 2015-06-17 MED ORDER — METFORMIN HCL 1000 MG PO TABS: 1000.0000 mg | ORAL_TABLET | Freq: Two times a day (BID) | ORAL | Status: DC

## 2015-06-17 MED ORDER — FLUCONAZOLE 100 MG PO TABS: 100.0000 mg | ORAL_TABLET | Freq: Once | ORAL | Status: DC

## 2015-06-17 NOTE — Progress Notes (Signed)
Patient ID: [name] [name], female   DOB: 10/08/1990, 24 y.o.   MRN: [id number]    Subjective:        [name] [name] is a 24 y.o. female here for a routine exam.  Current complaints: discharge with itching, for several weeks off an on.  Sexually active, homosexual.  Hx of PCOS, has irregular periods.  Desires to get on OCPs for control of androgen excess and to regulate her cycles.    Works full-time.    Personal health questionnaire:  Is patient Ashkenazi Jewish, have a family history of breast and/or ovarian cancer: no Is there a family history of uterine cancer diagnosed at age < 31, gastrointestinal cancer, urinary tract cancer, family member who is a Personnel officer syndrome-associated carrier: no Is the patient overweight and hypertensive, family history of diabetes, personal history of gestational diabetes, preeclampsia or PCOS: yes Is patient over 10, have PCOS,  family history of premature CHD under age 9, diabetes, smoke, have hypertension or peripheral artery disease:  yes At any time, has a partner hit, kicked or otherwise hurt or frightened you?: no Over the past 2 weeks, have you felt down, depressed or hopeless?: yes, used to be on medication.   Over the past 2 weeks, have you felt little interest or pleasure in doing things?:yes   Gynecologic History Patient's last menstrual period was 05/23/2015 (exact date). Contraception: none Last Pap: unknown. Results were: normal according to the patient Last mammogram: N/A.   Obstetric History OB History  Gravida Para Term Preterm AB SAB TAB Ectopic Multiple Living  0 1 1 0 0 0 1    # Outcome Date GA Lbr Len/2nd Weight Sex Delivery Anes PTL Lv  2 Term 07/20/13 [redacted]w[redacted]d / 03:27 9 lb 9.1 oz (4.34 kg) F Vag-Spont EPI  Y  1 SAB 09/2012 [redacted]w[redacted]d            Comments: System Generated. Please review and update pregnancy details.      Past Medical History  Diagnosis Date  . Diabetes mellitus   . Gonorrhea   . PCOS (polycystic ovarian  syndrome)   . Cushing syndrome (HCC)   . Hirsutism   . 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 Abscess drainage    . Tooth extraction       Current outpatient prescriptions:  .  fluconazole (DIFLUCAN) 100 MG tablet, Take 1 tablet (100 mg total) by mouth once. Repeat dose in 48-72 hour., Disp: 3 tablet, Rfl: 0 .  glucose blood (FREESTYLE LITE) test strip, Use as instructed (Patient not taking: Reported on 06/17/2015), Disp: 100 each, Rfl: 12 .  insulin glargine (LANTUS) 100 UNIT/ML injection, Inject 0.15 mLs (15 Units total) into the skin at bedtime. (Patient not taking: Reported on 06/17/2015), Disp: 10 mL, Rfl: 11 .  Lancets (FREESTYLE) lancets, Use as instructed (Patient not taking: Reported on 06/17/2015), Disp: 100 each, Rfl: 12 .  metFORMIN (GLUCOPHAGE) 1000 MG tablet, Take 1 tablet (1,000 mg total) by mouth 2 (two) times daily with a meal., Disp: 60 tablet, Rfl: 12 .  norgestimate-ethinyl estradiol (SPRINTEC 28) 0.25-35 MG-MCG tablet, Take 1 tablet by mouth daily., Disp: 1 Package, Rfl: 11 .  Prenat-Fe Poly-Methfol-FA-DHA (VITAFOL ULTRA) 29-0.6-0.4-200 MG CAPS, Take 1 tablet by mouth daily at 10 pm., Disp: 30 capsule, Rfl: 12 .  spironolactone (ALDACTONE) 50 MG tablet, Take 1 tablet (50 mg total) by mouth daily., Disp: 30 tablet, Rfl:  12 .  terconazole (TERAZOL 7) 0.4 % vaginal cream, Place 1 applicator vaginally at bedtime., Disp: 45 g, Rfl: 0 No Known Allergies  Social History  Substance Use Topics  . Smoking status: Current Every Day Smoker -- 0.10 packs/day    Types: Cigarettes  . Smokeless tobacco: Never Used  . Alcohol Use: 4.8 - 6.0 oz/week    3-4 Cans of beer, 5-6 Shots of liquor per week     Comment: socially    Family History  Problem Relation Age of Onset  . COPD Maternal Grandmother   . Hypertension Maternal Grandmother   . Heart disease Maternal Grandmother   . Epilepsy Father   . Cancer Father     lung  cancer  . Cancer Paternal Grandfather       Review of Systems  Constitutional: negative for fatigue and weight loss Respiratory: negative for cough and wheezing Cardiovascular: negative for chest pain, fatigue and palpitations Gastrointestinal: negative for abdominal pain and change in bowel habits Musculoskeletal:negative for myalgias Neurological: negative for gait problems and tremors Behavioral/Psych: negative for abusive relationship, + hx of depression Endocrine: negative for temperature intolerance   Genitourinary:negative for abnormal menstrual periods, genital lesions, hot flashes, sexual problems and vaginal discharge Integument/breast: negative for breast lump, breast tenderness, nipple discharge and skin lesion(s)    Objective:       BP 113/77 mmHg  Pulse 78  Ht  (1.676 m)  Wt 288 lb (130.636 kg)  BMI 46.51 kg/m2  LMP 05/23/2015 (Exact Date) General:   alert  Skin:   no rash or abnormalities, +hirsutism  Lungs:   clear to auscultation bilaterally  Heart:   regular rate and rhythm, S1, S2 normal, no murmur, click, rub or gallop  Breasts:   normal without suspicious masses, skin or nipple changes or axillary nodes  Abdomen:  normal findings: no organomegaly, soft, non-tender and no hernia  Pelvis:  External genitalia: normal general appearance Urinary system: urethral meatus normal and bladder without fullness, nontender Vaginal: normal without tenderness, induration or masses Cervix: normal appearance Adnexa: normal bimanual exam Uterus: anteverted and non-tender, normal size   Lab Review Urine pregnancy test Labs reviewed yes Radiologic studies reviewed no  50% of 30 min visit spent on counseling and coordination of care.   Assessment:    Healthy female exam.   Hx of PCOS  STD screening exam  Hx of depression  Plan:    Education reviewed: calcium supplements, depression evaluation, low fat, low cholesterol diet, safe sex/STD prevention, self  breast exams, skin cancer screening and weight bearing exercise. Contraception: OCP (estrogen/progesterone). Follow up in: 1 year.   Meds ordered this encounter  Medications  . norgestimate-ethinyl estradiol (SPRINTEC 28) 0.25-35 MG-MCG tablet    Sig: Take 1 tablet by mouth daily.    Dispense:  1 Package    Refill:  11  . metFORMIN (GLUCOPHAGE) 1000 MG tablet    Sig: Take 1 tablet (1,000 mg total) by mouth 2 (two) times daily with a meal.    Dispense:  60 tablet    Refill:  12  . spironolactone (ALDACTONE) 50 MG tablet    Sig: Take 1 tablet (50 mg total) by mouth daily.    Dispense:  30 tablet    Refill:  12  . fluconazole (DIFLUCAN) 100 MG tablet    Sig: Take 1 tablet (100 mg total) by mouth once. Repeat dose in 48-72 hour.    Dispense:  3 tablet    Refill:  0  . terconazole (TERAZOL 7) 0.4 % vaginal cream    Sig: Place 1 applicator vaginally at bedtime.    Dispense:  45 g    Refill:  0  . Prenat-Fe Poly-Methfol-FA-DHA (VITAFOL ULTRA) 29-0.6-0.4-200 MG CAPS    Sig: Take 1 tablet by mouth daily at 10 pm.    Dispense:  30 capsule    Refill:  12   Orders Placed This Encounter  Procedures  . SureSwab, Vaginosis/Vaginitis Plus  . HIV antibody (with reflex)  . Hepatitis B surface antigen  . RPR  . Hepatitis C antibody  . TSH  . CBC with Differential/Platelet  . Comprehensive metabolic panel  . Referral to Nutrition and Diabetes Services    Referral Priority:  Routine    Referral Type:  Consultation    Referral Reason:  Specialty Services Required    Number of Visits Requested:  1  . Ambulatory referral to Internal Medicine    Referral Priority:  Routine    Referral Type:  Consultation    Referral Reason:  Specialty Services Required    Requested Specialty:  Internal Medicine    Number of Visits Requested:  1   Possible management options include: infertility management

## 2015-06-18 LAB — HIV ANTIBODY (ROUTINE TESTING W REFLEX): HIV 1&2 Ab, 4th Generation: NONREACTIVE

## 2015-06-18 LAB — PAP IG W/ RFLX HPV ASCU

## 2015-06-18 LAB — RPR

## 2015-06-18 LAB — HEPATITIS C ANTIBODY: HCV Ab: NEGATIVE

## 2015-06-18 LAB — HEPATITIS B SURFACE ANTIGEN: Hepatitis B Surface Ag: NEGATIVE

## 2015-06-21 LAB — SURESWAB, VAGINOSIS/VAGINITIS PLUS
Atopobium vaginae: DETECTED Log (cells/mL)
BV CATEGORY: UNDETERMINED — AB
C. GLABRATA, DNA: NOT DETECTED
C. albicans, DNA: DETECTED — AB
C. parapsilosis, DNA: NOT DETECTED
C. trachomatis RNA, TMA: NOT DETECTED
C. tropicalis, DNA: NOT DETECTED
LACTOBACILLUS SPECIES: DETECTED Log (cells/mL)
MEGASPHAERA SPECIES: 8 Log (cells/mL)
N. GONORRHOEAE RNA, TMA: NOT DETECTED
T. vaginalis RNA, QL TMA: NOT DETECTED

## 2015-06-25 ENCOUNTER — Other Ambulatory Visit: Payer: Self-pay | Admitting: Certified Nurse Midwife

## 2015-06-25 DIAGNOSIS — N760 Acute vaginitis: Principal | ICD-10-CM

## 2015-06-25 DIAGNOSIS — B9689 Other specified bacterial agents as the cause of diseases classified elsewhere: Secondary | ICD-10-CM

## 2015-06-25 MED ORDER — TERCONAZOLE 0.4 % VA CREA: 1.0000 | TOPICAL_CREAM | Freq: Every day | VAGINAL | Status: DC

## 2015-06-25 MED ORDER — METRONIDAZOLE 500 MG PO TABS: 500.0000 mg | ORAL_TABLET | Freq: Two times a day (BID) | ORAL | Status: DC

## 2015-06-25 MED ORDER — FLUCONAZOLE 100 MG PO TABS: 100.0000 mg | ORAL_TABLET | Freq: Once | ORAL | Status: DC

## 2015-07-01 ENCOUNTER — Ambulatory Visit: Payer: Medicaid Other | Admitting: Dietician

## 2015-07-08 ENCOUNTER — Ambulatory Visit: Payer: Medicaid Other | Admitting: Certified Nurse Midwife

## 2015-07-15 ENCOUNTER — Ambulatory Visit: Payer: Medicaid Other | Admitting: Certified Nurse Midwife

## 2015-07-18 ENCOUNTER — Ambulatory Visit (INDEPENDENT_AMBULATORY_CARE_PROVIDER_SITE_OTHER): Payer: Medicaid Other | Admitting: Certified Nurse Midwife

## 2015-07-22 NOTE — Progress Notes (Signed)
Patient did not stay for appointment.

## 2015-07-28 ENCOUNTER — Ambulatory Visit: Payer: Medicaid Other | Admitting: Dietician

## 2015-07-29 ENCOUNTER — Other Ambulatory Visit: Payer: Self-pay | Admitting: Certified Nurse Midwife

## 2015-08-11 ENCOUNTER — Encounter (HOSPITAL_COMMUNITY): Payer: Self-pay

## 2015-08-11 ENCOUNTER — Emergency Department (HOSPITAL_COMMUNITY)
Admission: EM | Admit: 2015-08-11 | Discharge: 2015-08-11 | Disposition: A | Discharge status: Home / Self Care | Payer: Medicaid Other | Attending: Emergency Medicine | Admitting: Emergency Medicine

## 2015-08-11 DIAGNOSIS — F17210 Nicotine dependence, cigarettes, uncomplicated: Secondary | ICD-10-CM | POA: Diagnosis not present

## 2015-08-11 DIAGNOSIS — R202 Paresthesia of skin: Secondary | ICD-10-CM | POA: Diagnosis not present

## 2015-08-11 DIAGNOSIS — Z793 Long term (current) use of hormonal contraceptives: Secondary | ICD-10-CM | POA: Diagnosis not present

## 2015-08-11 DIAGNOSIS — Z8619 Personal history of other infectious and parasitic diseases: Secondary | ICD-10-CM | POA: Insufficient documentation

## 2015-08-11 DIAGNOSIS — R109 Unspecified abdominal pain: Secondary | ICD-10-CM | POA: Diagnosis not present

## 2015-08-11 DIAGNOSIS — E1165 Type 2 diabetes mellitus with hyperglycemia: Secondary | ICD-10-CM | POA: Diagnosis not present

## 2015-08-11 DIAGNOSIS — R739 Hyperglycemia, unspecified: Secondary | ICD-10-CM

## 2015-08-11 DIAGNOSIS — Z9119 Patient's noncompliance with other medical treatment and regimen: Secondary | ICD-10-CM | POA: Insufficient documentation

## 2015-08-11 DIAGNOSIS — Z9114 Patient's other noncompliance with medication regimen: Secondary | ICD-10-CM

## 2015-08-11 DIAGNOSIS — Z872 Personal history of diseases of the skin and subcutaneous tissue: Secondary | ICD-10-CM | POA: Insufficient documentation

## 2015-08-11 DIAGNOSIS — Z8639 Personal history of other endocrine, nutritional and metabolic disease: Secondary | ICD-10-CM | POA: Insufficient documentation

## 2015-08-11 LAB — CBG MONITORING, ED
GLUCOSE-CAPILLARY: 323 mg/dL — AB (ref 65–99)
Glucose-Capillary: 453 mg/dL — ABNORMAL HIGH (ref 65–99)
Glucose-Capillary: 480 mg/dL — ABNORMAL HIGH (ref 65–99)

## 2015-08-11 LAB — BASIC METABOLIC PANEL
Anion gap: 10 (ref 5–15)
BUN: 20 mg/dL (ref 6–20)
CALCIUM: 9.8 mg/dL (ref 8.9–10.3)
CO2: 26 mmol/L (ref 22–32)
CREATININE: 0.76 mg/dL (ref 0.44–1.00)
Chloride: 100 mmol/L — ABNORMAL LOW (ref 101–111)
GFR calc Af Amer: >60 mL/min (ref >60)
Glucose, Bld: 539 mg/dL — ABNORMAL HIGH (ref 65–99)
POTASSIUM: 4.7 mmol/L (ref 3.5–5.1)
SODIUM: 136 mmol/L (ref 135–145)

## 2015-08-11 LAB — URINALYSIS, ROUTINE W REFLEX MICROSCOPIC
Bilirubin Urine: NEGATIVE
Hgb urine dipstick: NEGATIVE
KETONES UR: NEGATIVE mg/dL
LEUKOCYTES UA: NEGATIVE
NITRITE: NEGATIVE
PH: 6.5 (ref 5.0–8.0)
Protein, ur: NEGATIVE mg/dL
SPECIFIC GRAVITY, URINE: 1.029 (ref 1.005–1.030)

## 2015-08-11 LAB — CBC
HEMATOCRIT: 41.3 % (ref 36.0–46.0)
Hemoglobin: 13.7 g/dL (ref 12.0–15.0)
MCH: 31.1 pg (ref 26.0–34.0)
MCHC: 33.2 g/dL (ref 30.0–36.0)
MCV: 93.7 fL (ref 78.0–100.0)
Platelets: 224 10*3/uL (ref 150–400)
RBC: 4.41 MIL/uL (ref 3.87–5.11)
RDW: 12.3 % (ref 11.5–15.5)
WBC: 6.7 10*3/uL (ref 4.0–10.5)

## 2015-08-11 LAB — URINE MICROSCOPIC-ADD ON
BACTERIA UA: NONE SEEN
RBC / HPF: NONE SEEN RBC/hpf (ref 0–5)

## 2015-08-11 MED ORDER — SODIUM CHLORIDE 0.9 % IV BOLUS (SEPSIS)
1000.0000 mL | Freq: Once | INTRAVENOUS | Status: AC
  Administered 2015-08-11: 1000 mL via INTRAVENOUS

## 2015-08-11 MED ORDER — METFORMIN HCL 1000 MG PO TABS: 1000.0000 mg | ORAL_TABLET | Freq: Two times a day (BID) | ORAL | Status: DC

## 2015-08-11 MED ORDER — INSULIN ASPART 100 UNIT/ML ~~LOC~~ SOLN
10.0000 [IU] | Freq: Once | SUBCUTANEOUS | Status: AC
  Administered 2015-08-11: 10 [IU] via INTRAVENOUS

## 2015-08-11 NOTE — ED Notes (Signed)
Discharge instructions, follow up care, and rx x1 reviewed with patient. Also, reviewed with patient -  signs and symptoms of hyper- and hypoglycemia. Patient verbalized understanding.

## 2015-08-11 NOTE — ED Notes (Signed)
MD at bedside. 

## 2015-08-11 NOTE — Discharge Instructions (Signed)
Blood Glucose Monitoring, Adult °Monitoring your blood glucose (also know as blood sugar) helps you to manage your diabetes. It also helps you and your health care provider monitor your diabetes and determine how well your treatment plan is working. °WHY SHOULD YOU MONITOR YOUR BLOOD GLUCOSE? °· It can help you understand how food, exercise, and medicine affect your blood glucose. °· It allows you to know what your blood glucose is at any given moment. You can quickly tell if you are having low blood glucose (hypoglycemia) or high blood glucose (hyperglycemia). °· It can help you and your health care provider know how to adjust your medicines. °· It can help you understand how to manage an illness or adjust medicine for exercise. °WHEN SHOULD YOU TEST? °Your health care provider will help you decide how often you should check your blood glucose. This may depend on the type of diabetes you have, your diabetes control, or the types of medicines you are taking. Be sure to write down all of your blood glucose readings so that this information can be reviewed with your health care provider. See below for examples of testing times that your health care provider may suggest. °Type 1 Diabetes °· Test at least 2 times per day if your diabetes is well controlled, if you are using an insulin pump, or if you perform multiple daily injections. °· If your diabetes is not well controlled or if you are sick, you may need to test more often. °· It is a good idea to also test: °¨ Before every insulin injection. °¨ Before and after exercise. °¨ Between meals and 2 hours after a meal. °¨ Occasionally between 2:00 a.m. and 3:00 a.m. °Type 2 Diabetes °· If you are taking insulin, test at least 2 times per day. However, it is best to test before every insulin injection. °· If you take medicines by mouth (orally), test 2 times a day. °· If you are on a controlled diet, test once a day. °· If your diabetes is not well controlled or if you  are sick, you may need to monitor more often. °HOW TO MONITOR YOUR BLOOD GLUCOSE °Supplies Needed °· Blood glucose meter. °· Test strips for your meter. Each meter has its own strips. You must use the strips that go with your own meter. °· A pricking needle (lancet). °· A device that holds the lancet (lancing device). °· A journal or log book to write down your results. °Procedure °· Wash your hands with soap and water. Alcohol is not preferred. °· Prick the side of your finger (not the tip) with the lancet. °· Gently milk the finger until a small drop of blood appears. °· Follow the instructions that come with your meter for inserting the test strip, applying blood to the strip, and using your blood glucose meter. °Other Areas to Get Blood for Testing °Some meters allow you to use other areas of your body (other than your finger) to test your blood. These areas are called alternative sites. The most common alternative sites are: °· The forearm. °· The thigh. °· The back area of the lower leg. °· The palm of the hand. °The blood flow in these areas is slower. Therefore, the blood glucose values you get may be delayed, and the numbers are different from what you would get from your fingers. Do not use alternative sites if you think you are having hypoglycemia. Your reading will not be accurate. Always use a finger if you are   having hypoglycemia. Also, if you cannot feel your lows (hypoglycemia unawareness), always use your fingers for your blood glucose checks. °ADDITIONAL TIPS FOR GLUCOSE MONITORING °· Do not reuse lancets. °· Always carry your supplies with you. °· All blood glucose meters have a 24-hour "hotline" number to call if you have questions or need help. °· Adjust (calibrate) your blood glucose meter with a control solution after finishing a few boxes of strips. °BLOOD GLUCOSE RECORD KEEPING °It is a good idea to keep a daily record or log of your blood glucose readings. Most glucose meters, if not all,  keep your glucose records stored in the meter. Some meters come with the ability to download your records to your home computer. Keeping a record of your blood glucose readings is especially helpful if you are wanting to look for patterns. Make notes to go along with the blood glucose readings because you might forget what happened at that exact time. Keeping good records helps you and your health care provider to work together to achieve good diabetes management.  °  °This information is not intended to replace advice given to you by your health care provider. Make sure you discuss any questions you have with your health care provider. °  °Document Released: 06/24/2003 Document Revised: 07/12/2014 Document Reviewed: 11/13/2012 °Elsevier Interactive Patient Education ©2016 [employer] ° °Hyperglycemia °Hyperglycemia occurs when the glucose (sugar) in your blood is too high. Hyperglycemia can happen for many reasons, but it most often happens to people who do not know they have diabetes or are not managing their diabetes properly.  °CAUSES  °Whether you have diabetes or not, there are other causes of hyperglycemia. Hyperglycemia can occur when you have diabetes, but it can also occur in other situations that you might not be as aware of, such as: °Diabetes °· If you have diabetes and are having problems controlling your blood glucose, hyperglycemia could occur because of some of the following reasons: °¨ Not following your meal plan. °¨ Not taking your diabetes medications or not taking it properly. °¨ Exercising less or doing less activity than you normally do. °¨ Being sick. °Pre-diabetes °· This cannot be ignored. Before people develop Type 2 diabetes, they almost always have "pre-diabetes." This is when your blood glucose levels are higher than normal, but not yet high enough to be diagnosed as diabetes. Research has shown that some long-term damage to the body, especially the heart and circulatory system, may  already be occurring during pre-diabetes. If you take action to manage your blood glucose when you have pre-diabetes, you may delay or prevent Type 2 diabetes from developing. °Stress °· If you have diabetes, you may be "diet" controlled or on oral medications or insulin to control your diabetes. However, you may find that your blood glucose is higher than usual in the hospital whether you have diabetes or not. This is often referred to as "stress hyperglycemia." Stress can elevate your blood glucose. This happens because of hormones put out by the body during times of stress. If stress has been the cause of your high blood glucose, it can be followed regularly by your caregiver. That way he/she can make sure your hyperglycemia does not continue to get worse or progress to diabetes. °Steroids °· Steroids are medications that act on the infection fighting system (immune system) to block inflammation or infection. One side effect can be a rise in blood glucose. Most people can produce enough extra insulin to allow for this rise, but   for those who cannot, steroids make blood glucose levels go even higher. It is not unusual for steroid treatments to "uncover" diabetes that is developing. It is not always possible to determine if the hyperglycemia will go away after the steroids are stopped. A special blood test called an A1c is sometimes done to determine if your blood glucose was elevated before the steroids were started. °SYMPTOMS °· Thirsty. °· Frequent urination. °· Dry mouth. °· Blurred vision. °· Tired or fatigue. °· Weakness. °· Sleepy. °· Tingling in feet or leg. °DIAGNOSIS  °Diagnosis is made by monitoring blood glucose in one or all of the following ways: °· A1c test. This is a chemical found in your blood. °· Fingerstick blood glucose monitoring. °· Laboratory results. °TREATMENT  °First, knowing the cause of the hyperglycemia is important before the hyperglycemia can be treated. Treatment may include, but is  not be limited to: °· Education. °· Change or adjustment in medications. °· Change or adjustment in meal plan. °· Treatment for an illness, infection, etc. °· More frequent blood glucose monitoring. °· Change in exercise plan. °· Decreasing or stopping steroids. °· Lifestyle changes. °HOME CARE INSTRUCTIONS  °· Test your blood glucose as directed. °· Exercise regularly. Your caregiver will give you instructions about exercise. Pre-diabetes or diabetes which comes on with stress is helped by exercising. °· Eat wholesome, balanced meals. Eat often and at regular, fixed times. Your caregiver or nutritionist will give you a meal plan to guide your sugar intake. °· Being at an ideal weight is important. If needed, losing as little as 10 to 15 pounds may help improve blood glucose levels. °SEEK MEDICAL CARE IF:  °· You have questions about medicine, activity, or diet. °· You continue to have symptoms (problems such as increased thirst, urination, or weight gain). °SEEK IMMEDIATE MEDICAL CARE IF:  °· You are vomiting or have diarrhea. °· Your breath smells fruity. °· You are breathing faster or slower. °· You are very sleepy or incoherent. °· You have numbness, tingling, or pain in your feet or hands. °· You have chest pain. °· Your symptoms get worse even though you have been following your caregiver's orders. °· If you have any other questions or concerns. °  °This information is not intended to replace advice given to you by your health care provider. Make sure you discuss any questions you have with your health care provider. °  °Document Released: 12/15/2000 Document Revised: 09/13/2011 Document Reviewed: 02/25/2015 °Elsevier Interactive Patient Education ©2016 [employer] ° °

## 2015-08-11 NOTE — ED Notes (Addendum)
Pt presents with c/o right hand numbness and  7/10 right sided flank pain. Pt BSG in triage is 453. Pt has a hx of Type II Diabetes. Pt reports increased urinary frequency. Pt denies pain upon voiding. Pt A+OX4, speaking in complete sentences.

## 2015-08-11 NOTE — ED Provider Notes (Signed)
CSN: [id number]     Arrival date & time 08/11/15  0052 History   First MD Initiated Contact with Patient 08/11/15 0136     Chief Complaint  Patient presents with  . Hyperglycemia  . Numbness  . Flank Pain     (Consider location/radiation/quality/duration/timing/severity/associated sxs/prior Treatment) HPI Comments: Patient with a history of Type 2 DM, uncontrolled, not on insulin for months, no PCP follow up, presents with complaint of right hand numbness without weakness. Symptoms for the past couple of days. The last time she had similar symptoms was when her blood sugar was elevated. She states she has not been treating her diabetes and has not been seen by primary care in months. She does not check her blood sugar at home because she does not have a glucometer. No nausea, vomiting.   Patient is a 25 y.o. female presenting with hyperglycemia. The history is provided by the patient. No language interpreter was used.  Hyperglycemia Blood sugar level PTA:  Not measured Associated symptoms: no fever, no nausea, no shortness of breath, no vomiting and no weakness     Past Medical History  Diagnosis Date  . Diabetes mellitus   . Gonorrhea   . PCOS (polycystic ovarian syndrome)   . Cushing syndrome (HCC)   . Hirsutism   . 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 Abscess drainage    . Tooth extraction     Family History  Problem Relation Age of Onset  . COPD Maternal Grandmother   . Hypertension Maternal Grandmother   . Heart disease Maternal Grandmother   . Epilepsy Father   . Cancer Father     lung cancer  . Cancer Paternal Grandfather    Social History  Substance Use Topics  . Smoking status: Current Every Day Smoker -- 0.10 packs/day    Types: Cigarettes  . Smokeless tobacco: Never Used  . Alcohol Use: 4.8 - 6.0 oz/week    3-4 Cans of beer, 5-6 Shots of liquor per week     Comment: socially   OB History    Gravida  Para Term Preterm AB TAB SAB Ectopic Multiple Living   0 1 0 1 0 0 1     Review of Systems  Constitutional: Negative for fever and chills.  HENT: Negative.   Respiratory: Negative.  Negative for shortness of breath.   Cardiovascular: Negative.   Gastrointestinal: Negative.  Negative for nausea and vomiting.  Genitourinary: Positive for frequency and flank pain.  Musculoskeletal: Negative.   Neurological: Positive for numbness. Negative for weakness.      Allergies  Review of patient's allergies indicates no known allergies.  Home Medications   Prior to Admission medications   Medication Sig Start Date End Date Taking? Authorizing Provider  fluconazole (DIFLUCAN) 100 MG tablet Take 1 tablet (100 mg total) by mouth once. Repeat dose in 48-72 hour. Patient not taking: Reported on 08/11/2015 06/25/15   Rachelle A Denney, CNM  insulin glargine (LANTUS) 100 UNIT/ML injection Inject 0.15 mLs (15 Units total) into the skin at bedtime. Patient not taking: Reported on 06/17/2015 10/13/14   Joni Reining Pisciotta, PA-C  metFORMIN (GLUCOPHAGE) 1000 MG tablet Take 1 tablet (1,000 mg total) by mouth 2 (two) times daily with a meal. Patient not taking: Reported on 08/11/2015 06/17/15   Rachelle A Denney, CNM  metroNIDAZOLE (FLAGYL) 500 MG tablet Take 1 tablet (500 mg total) by mouth 2 (two) times daily. Patient not  taking: Reported on 08/11/2015 06/25/15   Roe Coombs, CNM  norgestimate-ethinyl estradiol (SPRINTEC 28) 0.25-35 MG-MCG tablet Take 1 tablet by mouth daily. Patient not taking: Reported on 08/11/2015 06/17/15   Rachelle A Denney, CNM  Prenat-Fe Poly-Methfol-FA-DHA (VITAFOL ULTRA) 29-0.6-0.4-200 MG CAPS Take 1 tablet by mouth daily at 10 pm. Patient not taking: Reported on 08/11/2015 06/17/15   Boykin Reaper A Denney, CNM  spironolactone (ALDACTONE) 50 MG tablet Take 1 tablet (50 mg total) by mouth daily. Patient not taking: Reported on 08/11/2015 06/17/15   Rachelle A Denney, CNM  terconazole  (TERAZOL 7) 0.4 % vaginal cream Place 1 applicator vaginally at bedtime. Patient not taking: Reported on 08/11/2015 06/25/15   Rachelle A Denney, CNM   BP 128/92 mmHg  Pulse 89  Temp(Src) 98.1 F (36.7 C) (Oral)  Resp 20  SpO2 99%  LMP 08/04/2015 (Exact Date) Physical Exam  Constitutional: She is oriented to person, place, and time. She appears well-developed and well-nourished.  HENT:  Head: Normocephalic.  Neck: Normal range of motion. Neck supple.  Cardiovascular: Normal rate and intact distal pulses.   Pulmonary/Chest: Effort normal.  Musculoskeletal: Normal range of motion.  Right hand without swelling, discoloration or weakness.   Neurological: She is alert and oriented to person, place, and time.  Slight decrease in sensation to light touch on right dorsal hand.   Skin: Skin is warm and dry. No rash noted.  Psychiatric: She has a normal mood and affect.    ED Course  Procedures (including critical care time) Labs Review Labs Reviewed  CBG MONITORING, ED - Abnormal; Notable for the following:    Glucose-Capillary 453 (*)    All other components within normal limits  BASIC METABOLIC PANEL  CBC  URINALYSIS, ROUTINE W REFLEX MICROSCOPIC (NOT AT [facility])  CBG MONITORING, ED   Results for orders placed or performed during the hospital encounter of 08/11/15  Basic metabolic panel  Result Value Ref Range   Sodium 136 135 - 145 mmol/L   Potassium 4.7 3.5 - 5.1 mmol/L   Chloride 100 (L) 101 - 111 mmol/L   CO2 26 22 - 32 mmol/L   Glucose, Bld 539 (H) 65 - 99 mg/dL   BUN 20 6 - 20 mg/dL   Creatinine, Ser 1.61 0.44 - 1.00 mg/dL   Calcium 9.8 8.9 - 09.6 mg/dL   GFR calc non Af Amer >60 >60 mL/min   GFR calc Af Amer >60 >60 mL/min   Anion gap 10 5 - 15  CBC  Result Value Ref Range   WBC 6.7 4.0 - 10.5 K/uL   RBC 4.41 3.87 - 5.11 MIL/uL   Hemoglobin 13.7 12.0 - 15.0 g/dL   HCT 04.5 40.9 - 81.1 %   MCV 93.7 78.0 - 100.0 fL   MCH 31.1 26.0 - 34.0 pg   MCHC 33.2 30.0 - 36.0  g/dL   RDW 91.4 78.2 - 95.6 %   Platelets 224 150 - 400 K/uL  Urinalysis, Routine w reflex microscopic (not at [facility])  Result Value Ref Range   Color, Urine YELLOW YELLOW   APPearance CLEAR CLEAR   Specific Gravity, Urine 1.029 1.005 - 1.030   pH 6.5 5.0 - 8.0   Glucose, UA >1000 (A) NEGATIVE mg/dL   Hgb urine dipstick NEGATIVE NEGATIVE   Bilirubin Urine NEGATIVE NEGATIVE   Ketones, ur NEGATIVE NEGATIVE mg/dL   Protein, ur NEGATIVE NEGATIVE mg/dL   Nitrite NEGATIVE NEGATIVE   Leukocytes, UA NEGATIVE NEGATIVE  Urine microscopic-add on  Result Value Ref Range   Squamous Epithelial / LPF 0-5 (A) NONE SEEN   WBC, UA 0-5 0 - 5 WBC/hpf   RBC / HPF NONE SEEN 0 - 5 RBC/hpf   Bacteria, UA NONE SEEN NONE SEEN  CBG monitoring, ED  Result Value Ref Range   Glucose-Capillary 453 (H) 65 - 99 mg/dL   Comment 1 Notify RN   CBG monitoring, ED  Result Value Ref Range   Glucose-Capillary 480 (H) 65 - 99 mg/dL  CBG monitoring, ED  Result Value Ref Range   Glucose-Capillary 323 (H) 65 - 99 mg/dL     Imaging Review No results found. I have personally reviewed and evaluated these images and lab results as part of my medical decision-making.   EKG Interpretation None      MDM   Final diagnoses:  None    1. Hyperglycemia 2. Paresthesias 3. Medication noncompliance  Patient presents with right hand numbness, no weakness. She has hyperglycemia to 480, IVF and insulin provided with improvement to 323. No evidence of acidosis. VSS. Will refer back to [facility] where she has been a patient in the past. She is seen and evaluated by Dr. Preston Fleeting and found appropriate for discharge home.     Elpidio Anis, PA-C 08/11/15 0456  Dione Booze, MD 08/11/15 0600

## 2015-08-11 NOTE — ED Provider Notes (Deleted)
25 year old female comes in with right flank pain for several days. She is diabetic but knows not check her sugars at home and does not have PCP. She was noted to be quite hyperglycemic but without evidence of ketoacidosis. Exam is benign. Blood sugar is lowered with IV fluids and insulin and she will be given resource guide to try to get established with PCP.  I saw and evaluated the patient, reviewed the resident's note and I agree with the findings and plan.    Dione Booze, MD 08/11/15 [phone]

## 2015-08-14 ENCOUNTER — Inpatient Hospital Stay: Payer: Medicaid Other

## 2015-09-10 ENCOUNTER — Emergency Department (HOSPITAL_COMMUNITY)
Admission: EM | Admit: 2015-09-10 | Discharge: 2015-09-10 | Disposition: A | Discharge status: Home / Self Care | Payer: Medicaid Other | Attending: Emergency Medicine | Admitting: Emergency Medicine

## 2015-09-10 ENCOUNTER — Encounter (HOSPITAL_COMMUNITY): Payer: Self-pay

## 2015-09-10 DIAGNOSIS — R739 Hyperglycemia, unspecified: Secondary | ICD-10-CM

## 2015-09-10 DIAGNOSIS — Z872 Personal history of diseases of the skin and subcutaneous tissue: Secondary | ICD-10-CM | POA: Insufficient documentation

## 2015-09-10 DIAGNOSIS — Z3202 Encounter for pregnancy test, result negative: Secondary | ICD-10-CM | POA: Insufficient documentation

## 2015-09-10 DIAGNOSIS — F17210 Nicotine dependence, cigarettes, uncomplicated: Secondary | ICD-10-CM | POA: Diagnosis not present

## 2015-09-10 DIAGNOSIS — Z8619 Personal history of other infectious and parasitic diseases: Secondary | ICD-10-CM | POA: Insufficient documentation

## 2015-09-10 DIAGNOSIS — E282 Polycystic ovarian syndrome: Secondary | ICD-10-CM | POA: Insufficient documentation

## 2015-09-10 DIAGNOSIS — E1165 Type 2 diabetes mellitus with hyperglycemia: Secondary | ICD-10-CM | POA: Insufficient documentation

## 2015-09-10 LAB — BASIC METABOLIC PANEL
ANION GAP: 12 (ref 5–15)
BUN: 20 mg/dL (ref 6–20)
CHLORIDE: 98 mmol/L — AB (ref 101–111)
CO2: 22 mmol/L (ref 22–32)
Calcium: 9.2 mg/dL (ref 8.9–10.3)
Creatinine, Ser: 0.88 mg/dL (ref 0.44–1.00)
GFR calc non Af Amer: >60 mL/min (ref >60)
Glucose, Bld: 464 mg/dL — ABNORMAL HIGH (ref 65–99)
POTASSIUM: 4.2 mmol/L (ref 3.5–5.1)
SODIUM: 132 mmol/L — AB (ref 135–145)

## 2015-09-10 LAB — CBC
HEMATOCRIT: 42 % (ref 36.0–46.0)
HEMOGLOBIN: 14.7 g/dL (ref 12.0–15.0)
MCH: 32 pg (ref 26.0–34.0)
MCHC: 35 g/dL (ref 30.0–36.0)
MCV: 91.3 fL (ref 78.0–100.0)
PLATELETS: 239 10*3/uL (ref 150–400)
RBC: 4.6 MIL/uL (ref 3.87–5.11)
RDW: 12.1 % (ref 11.5–15.5)
WBC: 9.1 10*3/uL (ref 4.0–10.5)

## 2015-09-10 LAB — I-STAT BETA HCG BLOOD, ED (MC, WL, AP ONLY): I-stat hCG, quantitative: <5 m[IU]/mL (ref <5)

## 2015-09-10 LAB — CBG MONITORING, ED
Glucose-Capillary: 384 mg/dL — ABNORMAL HIGH (ref 65–99)
Glucose-Capillary: 398 mg/dL — ABNORMAL HIGH (ref 65–99)
Glucose-Capillary: 394 mg/dL — ABNORMAL HIGH (ref 65–99)

## 2015-09-10 MED ORDER — FLUCONAZOLE 150 MG PO TABS: ORAL_TABLET | ORAL | Status: DC

## 2015-09-10 MED ORDER — METFORMIN HCL 500 MG PO TABS: 500.0000 mg | ORAL_TABLET | Freq: Two times a day (BID) | ORAL | Status: DC

## 2015-09-10 NOTE — Discharge Instructions (Signed)
Hyperglycemia °Hyperglycemia occurs when the glucose (sugar) in your blood is too high. Hyperglycemia can happen for many reasons, but it most often happens to people who do not know they have diabetes or are not managing their diabetes properly.  °CAUSES  °Whether you have diabetes or not, there are other causes of hyperglycemia. Hyperglycemia can occur when you have diabetes, but it can also occur in other situations that you might not be as aware of, such as: °Diabetes °· If you have diabetes and are having problems controlling your blood glucose, hyperglycemia could occur because of some of the following reasons: °¨ Not following your meal plan. °¨ Not taking your diabetes medications or not taking it properly. °¨ Exercising less or doing less activity than you normally do. °¨ Being sick. °Pre-diabetes °· This cannot be ignored. Before people develop Type 2 diabetes, they almost always have "pre-diabetes." This is when your blood glucose levels are higher than normal, but not yet high enough to be diagnosed as diabetes. Research has shown that some long-term damage to the body, especially the heart and circulatory system, may already be occurring during pre-diabetes. If you take action to manage your blood glucose when you have pre-diabetes, you may delay or prevent Type 2 diabetes from developing. °Stress °· If you have diabetes, you may be "diet" controlled or on oral medications or insulin to control your diabetes. However, you may find that your blood glucose is higher than usual in the hospital whether you have diabetes or not. This is often referred to as "stress hyperglycemia." Stress can elevate your blood glucose. This happens because of hormones put out by the body during times of stress. If stress has been the cause of your high blood glucose, it can be followed regularly by your caregiver. That way he/she can make sure your hyperglycemia does not continue to get worse or progress to  diabetes. °Steroids °· Steroids are medications that act on the infection fighting system (immune system) to block inflammation or infection. One side effect can be a rise in blood glucose. Most people can produce enough extra insulin to allow for this rise, but for those who cannot, steroids make blood glucose levels go even higher. It is not unusual for steroid treatments to "uncover" diabetes that is developing. It is not always possible to determine if the hyperglycemia will go away after the steroids are stopped. A special blood test called an A1c is sometimes done to determine if your blood glucose was elevated before the steroids were started. °SYMPTOMS °· Thirsty. °· Frequent urination. °· Dry mouth. °· Blurred vision. °· Tired or fatigue. °· Weakness. °· Sleepy. °· Tingling in feet or leg. °DIAGNOSIS  °Diagnosis is made by monitoring blood glucose in one or all of the following ways: °· A1c test. This is a chemical found in your blood. °· Fingerstick blood glucose monitoring. °· Laboratory results. °TREATMENT  °First, knowing the cause of the hyperglycemia is important before the hyperglycemia can be treated. Treatment may include, but is not be limited to: °· Education. °· Change or adjustment in medications. °· Change or adjustment in meal plan. °· Treatment for an illness, infection, etc. °· More frequent blood glucose monitoring. °· Change in exercise plan. °· Decreasing or stopping steroids. °· Lifestyle changes. °HOME CARE INSTRUCTIONS  °· Test your blood glucose as directed. °· Exercise regularly. Your caregiver will give you instructions about exercise. Pre-diabetes or diabetes which comes on with stress is helped by exercising. °· Eat wholesome,   balanced meals. Eat often and at regular, fixed times. Your caregiver or nutritionist will give you a meal plan to guide your sugar intake. °· Being at an ideal weight is important. If needed, losing as little as 10 to 15 pounds may help improve blood  glucose levels. °SEEK MEDICAL CARE IF:  °· You have questions about medicine, activity, or diet. °· You continue to have symptoms (problems such as increased thirst, urination, or weight gain). °SEEK IMMEDIATE MEDICAL CARE IF:  °· You are vomiting or have diarrhea. °· Your breath smells fruity. °· You are breathing faster or slower. °· You are very sleepy or incoherent. °· You have numbness, tingling, or pain in your feet or hands. °· You have chest pain. °· Your symptoms get worse even though you have been following your caregiver's orders. °· If you have any other questions or concerns. °  °This information is not intended to replace advice given to you by your health care provider. Make sure you discuss any questions you have with your health care provider. °  °Document Released: 12/15/2000 Document Revised: 09/13/2011 Document Reviewed: 02/25/2015 °Elsevier Interactive Patient Education ©2016 [employer] ° °

## 2015-09-10 NOTE — ED Provider Notes (Signed)
CSN: [id number]     Arrival date & time 09/10/15  1433 History   First MD Initiated Contact with Patient 09/10/15 2103     Chief Complaint  Patient presents with  . Hyperglycemia     The history is provided by the patient. No language interpreter was used.   [name] [name] is a 25 y.o. female who presents to the Emergency Department complaining of hyperglycemia. About 30 minutes prior to ED arrival she felt that she was hot and flushed and thought that her blood sugar was elevated. She has a history of diabetes but is not currently on any medications. She reports excessive thirst and excessive urination. No fever, chest pain, shortness of breath, abdominal pain, vomiting, diarrhea. Symptoms are moderate, waxing and waning, worsening.  Past Medical History  Diagnosis Date  . Diabetes mellitus   . Gonorrhea   . PCOS (polycystic ovarian syndrome)   . Cushing syndrome (HCC)   . Hirsutism   . 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 Abscess drainage    . Tooth extraction     Family History  Problem Relation Age of Onset  . COPD Maternal Grandmother   . Hypertension Maternal Grandmother   . Heart disease Maternal Grandmother   . Epilepsy Father   . Cancer Father     lung cancer  . Cancer Paternal Grandfather    Social History  Substance Use Topics  . Smoking status: Current Every Day Smoker -- 0.50 packs/day    Types: Cigarettes  . Smokeless tobacco: Never Used  . Alcohol Use: 4.8 - 6.0 oz/week    3-4 Cans of beer, 5-6 Shots of liquor per week     Comment: socially   OB History    Gravida Para Term Preterm AB TAB SAB Ectopic Multiple Living   0 1 0 1 0 0 1     Review of Systems  All other systems reviewed and are negative.     Allergies  Review of patient's allergies indicates no known allergies.  Home Medications   Prior to Admission medications   Medication Sig Start Date End Date Taking? Authorizing  Provider  fluconazole (DIFLUCAN) 150 MG tablet Take one tablet once 09/10/15   Tilden Fossa, MD  insulin glargine (LANTUS) 100 UNIT/ML injection Inject 0.15 mLs (15 Units total) into the skin at bedtime. Patient not taking: Reported on 06/17/2015 10/13/14   Joni Reining Pisciotta, PA-C  metFORMIN (GLUCOPHAGE) 500 MG tablet Take 1 tablet (500 mg total) by mouth 2 (two) times daily with a meal. 09/10/15   Tilden Fossa, MD  norgestimate-ethinyl estradiol (SPRINTEC 28) 0.25-35 MG-MCG tablet Take 1 tablet by mouth daily. Patient not taking: Reported on 08/11/2015 06/17/15   Rachelle A Denney, CNM  Prenat-Fe Poly-Methfol-FA-DHA (VITAFOL ULTRA) 29-0.6-0.4-200 MG CAPS Take 1 tablet by mouth daily at 10 pm. Patient not taking: Reported on 08/11/2015 06/17/15   Boykin Reaper A Denney, CNM  spironolactone (ALDACTONE) 50 MG tablet Take 1 tablet (50 mg total) by mouth daily. Patient not taking: Reported on 08/11/2015 06/17/15   Rachelle A Denney, CNM  terconazole (TERAZOL 7) 0.4 % vaginal cream Place 1 applicator vaginally at bedtime. Patient not taking: Reported on 08/11/2015 06/25/15   Roe Coombs, CNM   BP 113/66 mmHg  Pulse 93  Temp(Src) 98.1 F (36.7 C) (Oral)  Resp 16  Ht  (1.702 m)  Wt 288 lb (130.636 kg)  BMI 45.10 kg/m2  SpO2  96%  LMP 08/04/2015 Physical Exam  Constitutional: She is oriented to person, place, and time. She appears well-developed and well-nourished.  HENT:  Head: Normocephalic and atraumatic.  Cardiovascular: Normal rate and regular [name].   No murmur heard. Pulmonary/Chest: Effort normal and breath sounds normal. No respiratory distress.  Abdominal: Soft. There is no tenderness. There is no rebound and no guarding.  Musculoskeletal: She exhibits no edema or tenderness.  Neurological: She is alert and oriented to person, place, and time.  Skin: Skin is warm and dry.  Psychiatric: She has a normal mood and affect. Her behavior is normal.  Nursing note and vitals  reviewed.   ED Course  Procedures (including critical care time) Labs Review Labs Reviewed  BASIC METABOLIC PANEL - Abnormal; Notable for the following:    Sodium 132 (*)    Chloride 98 (*)    Glucose, Bld 464 (*)    All other components within normal limits  CBG MONITORING, ED - Abnormal; Notable for the following:    Glucose-Capillary 384 (*)    All other components within normal limits  CBG MONITORING, ED - Abnormal; Notable for the following:    Glucose-Capillary 398 (*)    All other components within normal limits  CBG MONITORING, ED - Abnormal; Notable for the following:    Glucose-Capillary 394 (*)    All other components within normal limits  CBC  URINALYSIS, ROUTINE W REFLEX MICROSCOPIC (NOT [facility])  I-STAT BETA HCG BLOOD, ED ([facility], [facility], [facility] ONLY)    Imaging Review No results found. I have personally reviewed and evaluated these images and lab results as part of my medical decision-making.   EKG Interpretation None      MDM   Final diagnoses:  Hyperglycemia    Patient with history of type 2 diabetes here for elevated blood sugar that is symptomatic. She is currently off of her medications. BMP with glucose of 464, sodium of 132. Fingerstick blood sugar down to 300s in department. She is tolerating oral fluids without difficulty. We will restart her metformin as close outpatient follow-up and return precautions. Patient is requesting treatment for yeast infection, providing prescription for Diflucan.    Tilden FossaElizabeth Jomes Giraldo, MD 09/10/15 [phone]

## 2015-09-10 NOTE — ED Notes (Signed)
Pt c/o dry mouth and "feeling hot" d/t hyperglycemia starting today.  Hx of DM.  Pt reports that she has not taken medications "for years."  Sts she does not have a PCP.

## 2015-09-12 ENCOUNTER — Ambulatory Visit: Payer: Medicaid Other | Admitting: Certified Nurse Midwife

## 2015-09-22 ENCOUNTER — Emergency Department (HOSPITAL_COMMUNITY)
Admission: EM | Admit: 2015-09-22 | Discharge: 2015-09-22 | Disposition: A | Discharge status: Home / Self Care | Payer: Medicaid Other | Attending: Emergency Medicine | Admitting: Emergency Medicine

## 2015-09-22 ENCOUNTER — Encounter (HOSPITAL_COMMUNITY): Payer: Self-pay | Admitting: Emergency Medicine

## 2015-09-22 DIAGNOSIS — R1110 Vomiting, unspecified: Secondary | ICD-10-CM | POA: Insufficient documentation

## 2015-09-22 DIAGNOSIS — F17210 Nicotine dependence, cigarettes, uncomplicated: Secondary | ICD-10-CM | POA: Insufficient documentation

## 2015-09-22 DIAGNOSIS — R197 Diarrhea, unspecified: Secondary | ICD-10-CM | POA: Insufficient documentation

## 2015-09-22 DIAGNOSIS — E119 Type 2 diabetes mellitus without complications: Secondary | ICD-10-CM | POA: Insufficient documentation

## 2015-09-22 LAB — COMPREHENSIVE METABOLIC PANEL
ALT: 14 U/L (ref 14–54)
AST: 15 U/L (ref 15–41)
Albumin: 3.8 g/dL (ref 3.5–5.0)
Alkaline Phosphatase: 85 U/L (ref 38–126)
Anion gap: 13 (ref 5–15)
BUN: 8 mg/dL (ref 6–20)
CHLORIDE: 99 mmol/L — AB (ref 101–111)
CO2: 24 mmol/L (ref 22–32)
CREATININE: 0.65 mg/dL (ref 0.44–1.00)
Calcium: 9.2 mg/dL (ref 8.9–10.3)
GFR calc non Af Amer: >60 mL/min (ref >60)
Glucose, Bld: 357 mg/dL — ABNORMAL HIGH (ref 65–99)
POTASSIUM: 3.7 mmol/L (ref 3.5–5.1)
SODIUM: 136 mmol/L (ref 135–145)
Total Bilirubin: 0.8 mg/dL (ref 0.3–1.2)
Total Protein: 8.1 g/dL (ref 6.5–8.1)

## 2015-09-22 LAB — CBC
HEMATOCRIT: 42.4 % (ref 36.0–46.0)
HEMOGLOBIN: 14.6 g/dL (ref 12.0–15.0)
MCH: 31.2 pg (ref 26.0–34.0)
MCHC: 34.4 g/dL (ref 30.0–36.0)
MCV: 90.6 fL (ref 78.0–100.0)
PLATELETS: 209 10*3/uL (ref 150–400)
RBC: 4.68 MIL/uL (ref 3.87–5.11)
RDW: 12.1 % (ref 11.5–15.5)
WBC: 9.1 10*3/uL (ref 4.0–10.5)

## 2015-09-22 LAB — I-STAT BETA HCG BLOOD, ED (MC, WL, AP ONLY): I-stat hCG, quantitative: <5 m[IU]/mL (ref <5)

## 2015-09-22 LAB — CBG MONITORING, ED: Glucose-Capillary: 313 mg/dL — ABNORMAL HIGH (ref 65–99)

## 2015-09-22 LAB — LIPASE, BLOOD: LIPASE: 25 U/L (ref 11–51)

## 2015-09-22 NOTE — ED Notes (Signed)
Pt sts N/V/D starting this am; pt sts thinks her CBG is elevated

## 2015-09-22 NOTE — ED Notes (Signed)
Pt came to desk and stated that she was not going to wait any longer and wanted to go home

## 2015-09-23 ENCOUNTER — Emergency Department (HOSPITAL_COMMUNITY)
Admission: EM | Admit: 2015-09-23 | Discharge: 2015-09-24 | Disposition: A | Discharge status: Home / Self Care | Payer: Medicaid Other | Attending: Emergency Medicine | Admitting: Emergency Medicine

## 2015-09-23 ENCOUNTER — Encounter (HOSPITAL_COMMUNITY): Payer: Self-pay | Admitting: Emergency Medicine

## 2015-09-23 DIAGNOSIS — M791 Myalgia: Secondary | ICD-10-CM | POA: Diagnosis not present

## 2015-09-23 DIAGNOSIS — Z8619 Personal history of other infectious and parasitic diseases: Secondary | ICD-10-CM | POA: Insufficient documentation

## 2015-09-23 DIAGNOSIS — Z7984 Long term (current) use of oral hypoglycemic drugs: Secondary | ICD-10-CM | POA: Diagnosis not present

## 2015-09-23 DIAGNOSIS — F17210 Nicotine dependence, cigarettes, uncomplicated: Secondary | ICD-10-CM | POA: Diagnosis not present

## 2015-09-23 DIAGNOSIS — Z8639 Personal history of other endocrine, nutritional and metabolic disease: Secondary | ICD-10-CM | POA: Diagnosis not present

## 2015-09-23 DIAGNOSIS — E1165 Type 2 diabetes mellitus with hyperglycemia: Secondary | ICD-10-CM | POA: Diagnosis not present

## 2015-09-23 DIAGNOSIS — R5383 Other fatigue: Secondary | ICD-10-CM | POA: Diagnosis present

## 2015-09-23 DIAGNOSIS — R739 Hyperglycemia, unspecified: Secondary | ICD-10-CM

## 2015-09-23 DIAGNOSIS — Z872 Personal history of diseases of the skin and subcutaneous tissue: Secondary | ICD-10-CM | POA: Diagnosis not present

## 2015-09-23 DIAGNOSIS — R112 Nausea with vomiting, unspecified: Secondary | ICD-10-CM | POA: Insufficient documentation

## 2015-09-23 DIAGNOSIS — Z3202 Encounter for pregnancy test, result negative: Secondary | ICD-10-CM | POA: Diagnosis not present

## 2015-09-23 LAB — CBC
HEMATOCRIT: 40.5 % (ref 36.0–46.0)
Hemoglobin: 13.5 g/dL (ref 12.0–15.0)
MCH: 30.5 pg (ref 26.0–34.0)
MCHC: 33.3 g/dL (ref 30.0–36.0)
MCV: 91.4 fL (ref 78.0–100.0)
Platelets: 202 10*3/uL (ref 150–400)
RBC: 4.43 MIL/uL (ref 3.87–5.11)
RDW: 12.2 % (ref 11.5–15.5)
WBC: 4.5 10*3/uL (ref 4.0–10.5)

## 2015-09-23 LAB — URINE MICROSCOPIC-ADD ON

## 2015-09-23 LAB — COMPREHENSIVE METABOLIC PANEL
ALBUMIN: 3.8 g/dL (ref 3.5–5.0)
ALT: 13 U/L — ABNORMAL LOW (ref 14–54)
AST: 17 U/L (ref 15–41)
Alkaline Phosphatase: 87 U/L (ref 38–126)
Anion gap: 17 — ABNORMAL HIGH (ref 5–15)
BUN: 8 mg/dL (ref 6–20)
CHLORIDE: 97 mmol/L — AB (ref 101–111)
CO2: 23 mmol/L (ref 22–32)
Calcium: 9.3 mg/dL (ref 8.9–10.3)
Creatinine, Ser: 0.78 mg/dL (ref 0.44–1.00)
GFR calc Af Amer: >60 mL/min (ref >60)
GFR calc non Af Amer: >60 mL/min (ref >60)
GLUCOSE: 396 mg/dL — AB (ref 65–99)
POTASSIUM: 3.5 mmol/L (ref 3.5–5.1)
SODIUM: 137 mmol/L (ref 135–145)
Total Bilirubin: 0.4 mg/dL (ref 0.3–1.2)
Total Protein: 7.8 g/dL (ref 6.5–8.1)

## 2015-09-23 LAB — URINALYSIS, ROUTINE W REFLEX MICROSCOPIC
BILIRUBIN URINE: NEGATIVE
Glucose, UA: >1000 mg/dL — AB
KETONES UR: 15 mg/dL — AB
NITRITE: NEGATIVE
PH: 6 (ref 5.0–8.0)
Protein, ur: NEGATIVE mg/dL
Specific Gravity, Urine: >1.046 — ABNORMAL HIGH (ref 1.005–1.030)

## 2015-09-23 LAB — CBG MONITORING, ED
GLUCOSE-CAPILLARY: 349 mg/dL — AB (ref 65–99)
Glucose-Capillary: 321 mg/dL — ABNORMAL HIGH (ref 65–99)
Glucose-Capillary: 307 mg/dL — ABNORMAL HIGH (ref 65–99)

## 2015-09-23 LAB — I-STAT VENOUS BLOOD GAS, ED
Bicarbonate: 27.4 mEq/L — ABNORMAL HIGH (ref 20.0–24.0)
O2 Saturation: 54 %
PH VEN: 7.339 — AB (ref 7.250–7.300)
TCO2: 29 mmol/L (ref 0–100)
pCO2, Ven: 50.8 mmHg — ABNORMAL HIGH (ref 45.0–50.0)
pO2, Ven: 31 mmHg (ref 31.0–45.0)

## 2015-09-23 LAB — PREGNANCY, URINE: Preg Test, Ur: NEGATIVE

## 2015-09-23 LAB — LIPASE, BLOOD: LIPASE: 24 U/L (ref 11–51)

## 2015-09-23 LAB — I-STAT BETA HCG BLOOD, ED (MC, WL, AP ONLY)

## 2015-09-23 MED ORDER — SODIUM CHLORIDE 0.9 % IV BOLUS (SEPSIS)
1000.0000 mL | Freq: Once | INTRAVENOUS | Status: AC
  Administered 2015-09-23: 1000 mL via INTRAVENOUS

## 2015-09-23 NOTE — ED Notes (Signed)
Pt cbg 349

## 2015-09-23 NOTE — ED Provider Notes (Signed)
CSN: [id number]     Arrival date & time 09/23/15  1319 History   First MD Initiated Contact with Patient 09/23/15 2032     Chief Complaint  Patient presents with  . Generalized Body Aches     HPI 25 y.o. female with history of type 2 diabetes Who presents with body aches, darkening of her urine, and vomiting throughout the day yesterday. Denies fevers, abdominal pain, chest pain, shortness of breath. She reports that despite being prescribed metformin she has not taken this or any medications for her diabetes for over 2 years. Denies dysuria. No fevers, cough, sore throat, rhinorrhea. She does not check her blood sugar at home. No prior history of DKA. She has not vomited during the day today and has been able to keep small amounts of food and fluids down. Vomiting yesterday was nonbloody nonbilious.   Past Medical History  Diagnosis Date  . Diabetes mellitus   . Gonorrhea   . PCOS (polycystic ovarian syndrome)   . Cushing syndrome (HCC)   . Hirsutism   . 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 Abscess drainage    . Tooth extraction     Family History  Problem Relation Age of Onset  . COPD Maternal Grandmother   . Hypertension Maternal Grandmother   . Heart disease Maternal Grandmother   . Epilepsy Father   . Cancer Father     lung cancer  . Cancer Paternal Grandfather    Social History  Substance Use Topics  . Smoking status: Current Every Day Smoker -- 0.50 packs/day    Types: Cigarettes  . Smokeless tobacco: Never Used  . Alcohol Use: 4.8 - 6.0 oz/week    3-4 Cans of beer, 5-6 Shots of liquor per week     Comment: socially   OB History    Gravida Para Term Preterm AB TAB SAB Ectopic Multiple Living   2 1 1  0 1 0 1 0 0 1     Review of Systems  Constitutional: Positive for fatigue. Negative for fever, chills, activity change and appetite change.  HENT: Negative for congestion, rhinorrhea and sore throat.    Eyes: Negative for visual disturbance.  Respiratory: Negative for cough, shortness of breath and wheezing.   Cardiovascular: Negative for chest pain.  Gastrointestinal: Positive for nausea and vomiting. Negative for abdominal pain, diarrhea, constipation, blood in stool and abdominal distention.  Genitourinary: Negative for dysuria, frequency and flank pain.  Musculoskeletal: Positive for myalgias (diffuse). Negative for back pain, joint swelling, arthralgias, gait problem, neck pain and neck stiffness.  Skin: Negative for rash.  Neurological: Negative for dizziness, tremors, syncope, facial asymmetry, speech difficulty, weakness, numbness and headaches.  Psychiatric/Behavioral: Negative for behavioral problems, confusion and agitation.      Allergies  Review of patient's allergies indicates no known allergies.  Home Medications   Prior to Admission medications   Medication Sig Start Date End Date Taking? Authorizing Provider  metFORMIN (GLUCOPHAGE) 500 MG tablet Take 1 tablet (500 mg total) by mouth 2 (two) times daily with a meal. 09/10/15  Yes Tilden FossaElizabeth Rees, MD  metFORMIN (GLUCOPHAGE) 500 MG tablet Take 1 tablet (500 mg total) by mouth 2 (two) times daily with a meal. 09/24/15   Tavia Stave Ernestina PennaBrunno Issacc Merlo, MD   BP 114/63 mmHg  Pulse 72  Temp(Src) 98.8 F (37.1 C) (Oral)  Resp 17  SpO2 97%  LMP 08/04/2015 Physical Exam  Constitutional: She is oriented to person, place,  and time. She appears well-developed and well-nourished. No distress.  HENT:  Head: Normocephalic and atraumatic.  Right Ear: External ear normal.  Left Ear: External ear normal.  Nose: Nose normal.  Mouth/Throat: Oropharynx is clear and moist. No oropharyngeal exudate.  Eyes: Conjunctivae and EOM are normal. Pupils are equal, round, and reactive to light. Right eye exhibits no discharge. Left eye exhibits no discharge.  Neck: Normal range of motion. Neck supple.  Cardiovascular: Normal rate, regular rhythm and  normal heart sounds.  Exam reveals no gallop and no friction rub.   No murmur heard. Pulmonary/Chest: Effort normal and breath sounds normal. No respiratory distress. She has no wheezes. She has no rales.  Abdominal: Soft. Bowel sounds are normal. She exhibits no distension and no mass. There is no tenderness. There is no rebound and no guarding.  Musculoskeletal: Normal range of motion. She exhibits no edema or tenderness.  Neurological: She is alert and oriented to person, place, and time. She exhibits normal muscle tone.  Skin: Skin is warm and dry. No rash noted. She is not diaphoretic.  Psychiatric: She has a normal mood and affect. Her behavior is normal. Judgment and thought content normal.    ED Course  Procedures (including critical care time) Labs Review Labs Reviewed  COMPREHENSIVE METABOLIC PANEL - Abnormal; Notable for the following:    Chloride 97 (*)    Glucose, Bld 396 (*)    ALT 13 (*)    Anion gap 17 (*)    All other components within normal limits  URINALYSIS, ROUTINE W REFLEX MICROSCOPIC (NOT AT [facility]) - Abnormal; Notable for the following:    APPearance CLOUDY (*)    Specific Gravity, Urine >1.046 (*)    Glucose, UA >1000 (*)    Hgb urine dipstick TRACE (*)    Ketones, ur 15 (*)    Leukocytes, UA MODERATE (*)    All other components within normal limits  URINE MICROSCOPIC-ADD ON - Abnormal; Notable for the following:    Squamous Epithelial / LPF 6-30 (*)    Bacteria, UA MANY (*)    All other components within normal limits  CBG MONITORING, ED - Abnormal; Notable for the following:    Glucose-Capillary 349 (*)    All other components within normal limits  I-STAT VENOUS BLOOD GAS, ED - Abnormal; Notable for the following:    pH, Ven 7.339 (*)    pCO2, Ven 50.8 (*)    Bicarbonate 27.4 (*)    All other components within normal limits  CBG MONITORING, ED - Abnormal; Notable for the following:    Glucose-Capillary 321 (*)    All other components within normal  limits  CBG MONITORING, ED - Abnormal; Notable for the following:    Glucose-Capillary 307 (*)    All other components within normal limits  LIPASE, BLOOD  CBC  PREGNANCY, URINE  I-STAT BETA HCG BLOOD, ED ([facility], [facility], [facility] ONLY)    Imaging Review No results found. I have personally reviewed and evaluated these images and lab results as part of my medical decision-making.   EKG Interpretation None      MDM   Final diagnoses:  Hyperglycemia   Patient is generally well-appearing. Afebrile and hemodynamically stable. No abdominal tenderness. Point-of-care glucose elevated to 349. While she has ketones in her urine, the pt is not acidotic and is not in DKA. CBC with no leukocytosis and CMP is unremarkable. Suspect hyperglycemia as the cause of her symptoms. Patient was counseled as to the  importance that she take her oral antiglycemics. Additionally, social work was consulted and set up an appointment for the patient with a primary care doctor within the next couple days to discuss management of her diabetes. Doubt serious bacterial infection at this time. She reported improvement in her symptoms and blood glucose improved after receiving 2 L of fluid. She stable for discharge.  Johnathyn Viscomi Ernestina Penna, MD 09/24/15 [id number]  Tilden Fossa, MD 09/27/15 [phone]

## 2015-09-23 NOTE — ED Notes (Addendum)
Pt reports generalized body aches x 2 days with darkening urine. Pt reports she has been drinking like normal. Pt does have hx of diabetes. NAD at this time. Pt also reports vomiting all day yesterday.

## 2015-09-24 MED ORDER — METFORMIN HCL 500 MG PO TABS: 500.0000 mg | ORAL_TABLET | Freq: Two times a day (BID) | ORAL | Status: DC

## 2015-09-26 ENCOUNTER — Telehealth: Payer: Self-pay | Admitting: General Practice

## 2015-09-26 ENCOUNTER — Ambulatory Visit (HOSPITAL_BASED_OUTPATIENT_CLINIC_OR_DEPARTMENT_OTHER): Payer: Medicaid Other | Admitting: Clinical

## 2015-09-26 ENCOUNTER — Encounter: Payer: Self-pay | Admitting: Internal Medicine

## 2015-09-26 ENCOUNTER — Inpatient Hospital Stay: Payer: Medicaid Other | Admitting: Internal Medicine

## 2015-09-26 ENCOUNTER — Ambulatory Visit: Payer: Medicaid Other | Attending: Internal Medicine | Admitting: Internal Medicine

## 2015-09-26 VITALS — BP 120/78 | HR 80 | Temp 98.2°F | Ht 67.0 in | Wt 289.0 lb

## 2015-09-26 DIAGNOSIS — L680 Hirsutism: Secondary | ICD-10-CM | POA: Diagnosis not present

## 2015-09-26 DIAGNOSIS — Z794 Long term (current) use of insulin: Secondary | ICD-10-CM | POA: Insufficient documentation

## 2015-09-26 DIAGNOSIS — A549 Gonococcal infection, unspecified: Secondary | ICD-10-CM | POA: Diagnosis not present

## 2015-09-26 DIAGNOSIS — E249 Cushing's syndrome, unspecified: Secondary | ICD-10-CM | POA: Insufficient documentation

## 2015-09-26 DIAGNOSIS — F314 Bipolar disorder, current episode depressed, severe, without psychotic features: Secondary | ICD-10-CM | POA: Diagnosis not present

## 2015-09-26 DIAGNOSIS — F17210 Nicotine dependence, cigarettes, uncomplicated: Secondary | ICD-10-CM | POA: Insufficient documentation

## 2015-09-26 DIAGNOSIS — Z9119 Patient's noncompliance with other medical treatment and regimen: Secondary | ICD-10-CM | POA: Diagnosis not present

## 2015-09-26 DIAGNOSIS — Z9114 Patient's other noncompliance with medication regimen: Secondary | ICD-10-CM | POA: Diagnosis not present

## 2015-09-26 DIAGNOSIS — IMO0001 Reserved for inherently not codable concepts without codable children: Secondary | ICD-10-CM

## 2015-09-26 DIAGNOSIS — E282 Polycystic ovarian syndrome: Secondary | ICD-10-CM | POA: Insufficient documentation

## 2015-09-26 DIAGNOSIS — Z79899 Other long term (current) drug therapy: Secondary | ICD-10-CM | POA: Diagnosis not present

## 2015-09-26 DIAGNOSIS — E1165 Type 2 diabetes mellitus with hyperglycemia: Secondary | ICD-10-CM | POA: Diagnosis not present

## 2015-09-26 LAB — POCT URINALYSIS DIPSTICK
Bilirubin, UA: NEGATIVE
Blood, UA: NEGATIVE
Glucose, UA: 500
Nitrite, UA: NEGATIVE
PH UA: 6.5
PROTEIN UA: NEGATIVE
SPEC GRAV UA: 1.01
Urobilinogen, UA: 1

## 2015-09-26 LAB — POCT GLYCOSYLATED HEMOGLOBIN (HGB A1C): HEMOGLOBIN A1C: 11.4

## 2015-09-26 LAB — GLUCOSE, POCT (MANUAL RESULT ENTRY)
POC GLUCOSE: 363 mg/dL — AB (ref 70–99)
POC GLUCOSE: 333 mg/dL — AB (ref 70–99)

## 2015-09-26 MED ORDER — GLUCOSE BLOOD VI STRP: ORAL_STRIP | Status: DC

## 2015-09-26 MED ORDER — INSULIN ASPART 100 UNIT/ML ~~LOC~~ SOLN
20.0000 [IU] | Freq: Once | SUBCUTANEOUS | Status: AC
  Administered 2015-09-26: 20 [IU] via SUBCUTANEOUS

## 2015-09-26 MED ORDER — METFORMIN HCL 500 MG PO TABS: 500.0000 mg | ORAL_TABLET | Freq: Two times a day (BID) | ORAL | Status: DC

## 2015-09-26 MED ORDER — INSULIN ASPART PROT & ASPART (70-30 MIX) 100 UNIT/ML ~~LOC~~ SUSP
20.0000 [IU] | Freq: Once | SUBCUTANEOUS | Status: DC
  Administered 2015-09-26: 20 [IU] via SUBCUTANEOUS

## 2015-09-26 MED ORDER — ACCU-CHEK AVIVA PLUS W/DEVICE KIT: PACK | Status: DC

## 2015-09-26 MED ORDER — ACCU-CHEK SOFTCLIX LANCETS MISC: Status: DC

## 2015-09-26 MED FILL — ACCU-CHEK AVIVA PLUS TEST S: 25 days supply | Qty: 100 | Fill #0

## 2015-09-26 MED FILL — ACCU-CHEK AVIVA PLUS METER: W/DEVICE | 30 days supply | Qty: 1 | Fill #0

## 2015-09-26 MED FILL — metFORMIN HCL 500 MG TABS: 500 | 30 days supply | Qty: 120 | Fill #0

## 2015-09-26 MED FILL — ACCU-CHEK SOFTCLIX LANCETS: 25 days supply | Qty: 100 | Fill #0

## 2015-09-26 NOTE — Patient Instructions (Signed)
Check sugars twice per day before meals and write them down. Bring it with you when you return to see me in 1 week.  You will increase Metformin dose on Friday 3/31.

## 2015-09-26 NOTE — Progress Notes (Signed)
Patient's here for hospital f/up for DM.   Patient is currently off her metformin meds x4940yrs, now.   Patient denies any pain today.  Per Vikki PortsValerie, patient will not have IV therapy for diabetes this OV.

## 2015-09-26 NOTE — Progress Notes (Signed)
ASSESSMENT: Pt experiencing Bipolar disorder, current episode depressed, severe, without psychotic features. [facility] as well as establish care with psychiatry. Pt would benefit from psychoeducation and brief therapeutic interventions regarding coping with symptoms of anxiety and depression, as they relate to bipolar disorder.  Stage of Change: contemplative  PLAN: 1. F/U with behavioral health consultant in one week 2. Psychiatric Medications: none current. 3. Behavioral recommendation(s):   -Call [facility] to establish with psychiatry [phone] -Spend time with 2yo daughter this weekend -[facility], [phone] when appointment is scheduled for psychiatry -Consider reading educational material regarding coping with symptoms of anxiety and depression  SUBJECTIVE: Pt. referred by Holland CommonsValerie Keck, NP for symptoms of anxiety and depression:  Pt. reports the following symptoms/concerns: Pt states she was last treated for bipolar disorder about one year ago, discontinued medication because she did not like how she felt taking meds, but is open to treatment again. Pt says she did experience postpartum depression, and may have experienced postpartum psychosis after birth of daughter, but was not diagnosed. Increase in all depressive symptoms in past two months; feels best when spending time with daughter.  Duration of problem: Two months Severity: severe  OBJECTIVE: Orientation & Cognition: Oriented x3. Thought processes normal and appropriate to situation. Mood: low. Affect: appropriate Appearance: appropriate Risk of harm to self or others: low risk of harm to self today, no known risk of harm to others (no SI, no HI) Substance use: alcohol, tobacco Assessments administered: PHQ9: 27, GAD7: 21  Diagnosis: Bipolar disorder, current episode depressed, severe, without psychotic features CPT Code:  F31.4 -------------------------------------------- Other(s) present in the room: none  Time spent with patient in exam room: 20 minutes, 12:10-12:30   Depression screen [facility] 09/26/2015 09/26/2015 09/26/2015  Decreased Interest 3 0 0  Down, Depressed, Hopeless 3 0 0  PHQ - 2 Score 6 0 0  Altered sleeping 3 - -  Tired, decreased energy 3 - -  Change in appetite 3 - -  Feeling bad or failure about yourself  3 - -  Trouble concentrating 3 - -  Moving slowly or fidgety/restless 3 - -  Suicidal thoughts 3 - -  PHQ-9 Score 27 - -    GAD 7 : Generalized Anxiety Score 09/26/2015  Nervous, Anxious, on Edge 3  Control/stop worrying 3  Worry too much - different things 3  Trouble relaxing 3  Restless 3  Easily annoyed or irritable 3  Afraid - awful might happen 3  Total GAD 7 Score 21

## 2015-09-26 NOTE — Progress Notes (Signed)
Patient ID: [name] [name], female   DOB: 08-18-1990, 25 y.o.   MRN: [id number]  PJA:[id number]  HAL:[id number]  DOB - September 19, 1990  CC:  Chief Complaint  Patient presents with  . Hospitalization Follow-up  . Diabetes       HPI: [name] [name] is a 25 y.o. female here today to establish medical care. Patient has a past medical history of diabetes type 2, PCOS, tobacco use, and Cushing Syndrome. Patient states that she was diagnosed with diabetes 3-4 years ago and has been on insulin and Metformin in the past given to her by her Endocrinologist. She notes that she has been off all medications for over 2 years. She does not have a glucometer but has been in the ER several times due to hyperglycemia. While in the ER she was given a script for Metformin but admits to never picking up the medication. She currently has very blurred vision, polyuria, polydipsia. She denies dizziness, nausea, vomiting, or neuropathy. Patient does not follow a diabetic diet. In fact patient had large Coke, fries, and fried wings last night. Patent reports concern because the last time she was placed on Metformin she became pregnant shortly after. She is not on birth control and smokes less than 1 ppd.   No Known Allergies Past Medical History  Diagnosis Date  . Diabetes mellitus   . Gonorrhea   . PCOS (polycystic ovarian syndrome)   . Cushing syndrome ([place])   . Hirsutism   . Diabetes mellitus, antepartum(648.03)    No current outpatient prescriptions on file prior to visit.   No current facility-administered medications on file prior to visit.   Family History  Problem Relation Age of Onset  . COPD Maternal Grandmother   . Hypertension Maternal Grandmother   . Heart disease Maternal Grandmother   . Epilepsy Father   . Cancer Father     lung cancer  . Cancer Paternal Grandfather    Social History   Social History  . Marital Status: Single    Spouse Name: N/A  . Number of Children: N/A  . Years of  Education: N/A   Occupational History  . Not on file.   Social History Main Topics  . Smoking status: Current Every Day Smoker -- 0.50 packs/day    Types: Cigarettes  . Smokeless tobacco: Never Used  . Alcohol Use: 4.8 - 6.0 oz/week    3-4 Cans of beer, 5-6 Shots of liquor per week     Comment: socially  . Drug Use: No  . Sexual Activity:    Partners: Female    Museum/gallery curator: None   Other Topics Concern  . Not on file   Social History Narrative    Review of Systems: Other than what is stated in HPI, all other systems are negative.   Objective:   Filed Vitals:   09/26/15 1110  BP: 120/78  Pulse: 80  Temp: 98.2 F (36.8 C)    Physical Exam  Constitutional: She is oriented to person, place, and time.  Cardiovascular: Normal rate, regular rhythm and normal heart sounds.   Pulses:      Dorsalis pedis pulses are 2+ on the right side, and 2+ on the left side.       Posterior tibial pulses are 2+ on the right side, and 2+ on the left side.  Pulmonary/Chest: Effort normal and breath sounds normal.  Feet:  Right Foot:  Protective Sensation: 10 sites tested.10 sites sensed. Skin Integrity: Negative for skin breakdown.  Left Foot:  Protective Sensation: 10 sites tested. 10 sites sensed. Skin Integrity: Negative for skin breakdown.  Neurological: She is alert and oriented to person, place, and time.  Skin: Skin is warm and dry.  Psychiatric:  depressed     Lab Results  Component Value Date   WBC 4.5 09/23/2015   HGB 13.5 09/23/2015   HCT 40.5 09/23/2015   MCV 91.4 09/23/2015   PLT 202 09/23/2015   Lab Results  Component Value Date   CREATININE 0.78 09/23/2015   BUN 8 09/23/2015   NA 137 09/23/2015   K 3.5 09/23/2015   CL 97* 09/23/2015   CO2 23 09/23/2015    Lab Results  Component Value Date   HGBA1C 11.4 09/26/2015   Lipid Panel  No results found for: CHOL, TRIG, HDL, CHOLHDL, VLDL, LDLCALC     Assessment and plan:   [name] [name] was seen  today for hospitalization follow-up and diabetes.  Diagnoses and all orders for this visit:  Uncontrolled type 2 diabetes mellitus without complication, without long-term current use of insulin (HCC) -     POCT glucose (manual entry) -     POCT A1C -     Microalbumin/Creatinine Ratio, Urine -     Urinalysis Dipstick -     POCT glucose (manual entry) -     insulin aspart (novoLOG) injection 20 Units; Inject 0.2 mLs (20 Units total) into the skin once in office. -     Amb Referral to Nutrition and Diabetic E -     Begin metFORMIN (GLUCOPHAGE) 500 MG tablet; Take 1 tablet (500 mg total) by mouth 2 (two) times daily with a meal. IN 1 week switch to 1,000 mg twice daily -     Blood Glucose Monitoring Suppl (ACCU-CHEK AVIVA PLUS) w/Device KIT; Check sugars ACHS for E11.9 -     ACCU-CHEK SOFTCLIX LANCETS lancets; Check sugars ACHS for E11.9 -     glucose blood (ACCU-CHEK AVIVA) test strip; Check sugars ACHS for E11.9 I have given her a meter today. She will start her Metformin 500 mg BID and increase to 1,000 mg in 1 week. She will then return to see me in 10 days for review.  Addressed weight, diet, exercise with patient. I have referred her to diabetes education to go over carb counting how to modify her diet to be successful.   Non compliance w medication regimen I stressed the complications of diabetes relating to non-compliance.   Total time spent with patient was 40 minutes. > 50% spent counseling and coordination care with patient.  Return for April 3-4 to see me DM f/u .    Lance Bosch, [facility] [phone] 09/26/2015, 1:43 PM

## 2015-09-26 NOTE — Telephone Encounter (Signed)
Patient was seen in the Ed and is in need of insulin and metformin. Patient was schedule an appointment by a Case Manager in the hospital, however the case manager incorrectly scheduled her and her appointment had to be reschedule. Patient was put on [name] CommonsValerie Keck NP Schedule but has been switched to [name] Mcclung PA Please follow up.

## 2015-09-27 LAB — MICROALBUMIN / CREATININE URINE RATIO
CREATININE, URINE: 53 mg/dL (ref 20–320)
Microalb Creat Ratio: 28 mcg/mg creat (ref <30)
Microalb, Ur: 1.5 mg/dL

## 2015-09-29 NOTE — Telephone Encounter (Signed)
Already addressed

## 2015-10-06 ENCOUNTER — Ambulatory Visit: Payer: Medicaid Other | Admitting: Internal Medicine

## 2015-10-20 ENCOUNTER — Telehealth: Payer: Self-pay | Admitting: Clinical

## 2015-10-20 NOTE — Telephone Encounter (Signed)
Attempt to f/u with pt; no voicemail set up, no message left 

## 2015-12-30 ENCOUNTER — Emergency Department (HOSPITAL_COMMUNITY)
Admission: EM | Admit: 2015-12-30 | Discharge: 2015-12-30 | Disposition: A | Discharge status: Home / Self Care | Payer: Medicaid Other | Attending: Emergency Medicine | Admitting: Emergency Medicine

## 2015-12-30 ENCOUNTER — Encounter (HOSPITAL_COMMUNITY): Payer: Self-pay | Admitting: Emergency Medicine

## 2015-12-30 DIAGNOSIS — Z7984 Long term (current) use of oral hypoglycemic drugs: Secondary | ICD-10-CM | POA: Insufficient documentation

## 2015-12-30 DIAGNOSIS — R202 Paresthesia of skin: Secondary | ICD-10-CM | POA: Insufficient documentation

## 2015-12-30 DIAGNOSIS — Z794 Long term (current) use of insulin: Secondary | ICD-10-CM | POA: Insufficient documentation

## 2015-12-30 DIAGNOSIS — Z79899 Other long term (current) drug therapy: Secondary | ICD-10-CM | POA: Insufficient documentation

## 2015-12-30 DIAGNOSIS — B349 Viral infection, unspecified: Secondary | ICD-10-CM | POA: Insufficient documentation

## 2015-12-30 DIAGNOSIS — E119 Type 2 diabetes mellitus without complications: Secondary | ICD-10-CM | POA: Insufficient documentation

## 2015-12-30 DIAGNOSIS — R0981 Nasal congestion: Secondary | ICD-10-CM

## 2015-12-30 DIAGNOSIS — F17210 Nicotine dependence, cigarettes, uncomplicated: Secondary | ICD-10-CM | POA: Insufficient documentation

## 2015-12-30 LAB — CBG MONITORING, ED: GLUCOSE-CAPILLARY: 344 mg/dL — AB (ref 65–99)

## 2015-12-30 NOTE — ED Notes (Signed)
Patient presents c/o cold like symptoms (nasal congestion, cough) and left leg numbness x1 day. Denies fever, N/V. History DM. Doesn't know the last time she checked.

## 2015-12-30 NOTE — Discharge Instructions (Signed)
There does not appear to be an emergent cause for your symptoms at this time. Please follow-up with your doctor for reevaluation. You may also follow-up with neurology for further evaluation of the paresthesia in your leg. Return to ED for any new or worsening symptoms. Paresthesia Paresthesia is an abnormal burning or prickling sensation. This sensation is generally felt in the hands, arms, legs, or feet. However, it may occur in any part of the body. Usually, it is not painful. The feeling may be described as:  Tingling or numbness.  Pins and needles.  Skin crawling.  Buzzing.  Limbs falling asleep.  Itching. Most people experience temporary (transient) paresthesia at some time in their lives. Paresthesia may occur when you breathe too quickly (hyperventilation). It can also occur without any apparent cause. Commonly, paresthesia occurs when pressure is placed on a nerve. The sensation quickly goes away after the pressure is removed. For some people, however, paresthesia is a long-lasting (chronic) condition that is caused by an underlying disorder. If you continue to have paresthesia, you may need further medical evaluation. HOME CARE INSTRUCTIONS Watch your condition for any changes. Taking the following actions may help to lessen any discomfort that you are feeling:  Avoid drinking alcohol.  Try acupuncture or massage to help relieve your symptoms.  Keep all follow-up visits as directed by your health care provider. This is important. SEEK MEDICAL CARE IF:  You continue to have episodes of paresthesia.  Your burning or prickling feeling gets worse when you walk.  You have pain, cramps, or dizziness.  You develop a rash. SEEK IMMEDIATE MEDICAL CARE IF:  You feel weak.  You have trouble walking or moving.  You have problems with speech, understanding, or vision.  You feel confused.  You cannot control your bladder or bowel movements.  You have numbness after an  injury.  You faint.   This information is not intended to replace advice given to you by your health care provider. Make sure you discuss any questions you have with your health care provider.   Document Released: 06/11/2002 Document Revised: 11/05/2014 Document Reviewed: 06/17/2014 Elsevier Interactive Patient Education [employer]2016 [employer]

## 2015-12-30 NOTE — ED Provider Notes (Signed)
CSN: [id number]     Arrival date & time 12/30/15  1034 History   First MD Initiated Contact with Patient 12/30/15 1148     Chief Complaint  Patient presents with  . Nasal Congestion  . Cough  . Leg Pain     (Consider location/radiation/quality/duration/timing/severity/associated sxs/prior Treatment) HPI [name] [name] is a 25 y.o. female with history of diabetes, comes in for evaluation of nasal congestion, cough and leg numbness. Patient reports she has had nasal congestion and dry cough over the past 2 days, has not tried anything to improve her symptoms. Denies any fevers, chills, hemoptysis, chest pain or shortness of breath. She also reports while sitting at work today at approximate 7:00 AM her left leg "fell asleep". She reports the sensation has gradually improved, but is still present. She denies any other focal numbness or weakness, difficulties walking, back pain, urinary symptoms, diarrhea or constipation. No abdominal pain, nausea or vomiting. She admits to medication noncompliance and does not take her metformin or insulin as directed.  Past Medical History  Diagnosis Date  . Diabetes mellitus   . Gonorrhea   . PCOS (polycystic ovarian syndrome)   . 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 Hirsutism   . 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 Abscess drainage    . Tooth extraction     Family History  Problem Relation Age of Onset  . COPD Maternal Grandmother   . Hypertension Maternal Grandmother   . Heart disease Maternal Grandmother   . Epilepsy Father   . Cancer Father     lung cancer  . Cancer Paternal Grandfather    Social History  Substance Use Topics  . Smoking status: Current Every Day Smoker -- 0.50 packs/day    Types: Cigarettes  . Smokeless tobacco: Never Used  . Alcohol Use: 4.8 - 6.0 oz/week    3-4 Cans of beer, 5-6 Shots of liquor per week     Comment: socially   OB History    Gravida Para Term Preterm  AB TAB SAB Ectopic Multiple Living   2 1 1  0 1 0 1 0 0 1     Review of Systems A 10 point review of systems was completed and was negative except for pertinent positives and negatives as mentioned in the history of present illness     Allergies  Review of patient's allergies indicates no known allergies.  Home Medications   Prior to Admission medications   Medication Sig Start Date End Date Taking? Authorizing Provider  ACCU-CHEK SOFTCLIX LANCETS lancets Check sugars ACHS for E11.9 09/26/15   Lance Bosch, NP  Blood Glucose Monitoring Suppl (ACCU-CHEK AVIVA PLUS) w/Device KIT Check sugars ACHS for E11.9 09/26/15   Lance Bosch, NP  glucose blood (ACCU-CHEK AVIVA) test strip Check sugars ACHS for E11.9 09/26/15   Lance Bosch, NP  metFORMIN (GLUCOPHAGE) 500 MG tablet Take 1 tablet (500 mg total) by mouth 2 (two) times daily with a meal. IN 1 week switch to 1,000 mg twice daily 09/26/15   Lance Bosch, NP   BP 122/72 mmHg  Pulse 90  Temp(Src) 97.7 F (36.5 C) (Oral)  Resp 19  Ht 5' 7"  (1.702 m)  Wt 131.543 kg  BMI 45.41 kg/m2  SpO2 99% Physical Exam  Constitutional: She is oriented to person, place, and time. She appears well-developed and well-nourished.  Obese African-American female  HENT:  Head: Normocephalic and atraumatic.  Mouth/Throat:  Oropharynx is clear and moist.  No facial pain or tenderness over sinuses.  Eyes: Conjunctivae are normal. Pupils are equal, round, and reactive to light. Right eye exhibits no discharge. Left eye exhibits no discharge. No scleral icterus.  Neck: Neck supple.  Cardiovascular: Normal rate, regular rhythm, normal heart sounds and intact distal pulses.   Pulmonary/Chest: Effort normal and breath sounds normal. No respiratory distress. She has no wheezes. She has no rales.  Abdominal: Soft. There is no tenderness.  Musculoskeletal: She exhibits no tenderness.  Full active range of motion of all extremities. Distal pulses intact with  brisk cap refill. No unilateral leg swelling. No abnormal findings.  Neurological: She is alert and oriented to person, place, and time.  Cranial Nerves II-XII grossly intact. Motor strength is 5/5 in all 4 extremities. Sensation is intact to light touch. Patient is able to dorsiflex and plantarflex great toe without difficulty. Able to stand on tiptoe. Gait is baseline. No ataxia.  Skin: Skin is warm and dry. No rash noted.  Psychiatric: She has a normal mood and affect.  Nursing note and vitals reviewed.   ED Course  Procedures (including critical care time) Labs Review Labs Reviewed  CBG MONITORING, ED - Abnormal; Notable for the following:    Glucose-Capillary 344 (*)    All other components within normal limits    Imaging Review No results found. I have personally reviewed and evaluated these images and lab results as part of my medical decision-making.   EKG Interpretation None      MDM  Patient with history of PCO S, diabetes-insulin-dependent, here for nasal congestion and left leg paresthesia. She has a nonfocal neuro exam and overall appears well. She is hemodynamically stable and afebrile. Blood sugar is elevated, patient admits she is noncompliant with medication. Discussed the importance of medication compliance and tight blood sugar control. She verbalizes understanding. Discussed follow-up with PCP. Referral given to neurology for further evaluation of paresthesia. Discussed with Dr. Kathrynn Humble, who also saw evaluating patient and agrees with above plan. Final diagnoses:  Viral illness  Nasal congestion  Paresthesia of left leg        Comer Locket, PA-C 12/30/15 1600  Varney Biles, MD 12/31/15 1651

## 2016-02-18 ENCOUNTER — Ambulatory Visit (HOSPITAL_COMMUNITY)
Admission: RE | Admit: 2016-02-18 | Discharge: 2016-02-18 | Disposition: A | Discharge status: Home / Self Care | Payer: BLUE CROSS/BLUE SHIELD | Attending: Psychiatry | Admitting: Psychiatry

## 2016-02-18 DIAGNOSIS — A549 Gonococcal infection, unspecified: Secondary | ICD-10-CM | POA: Diagnosis not present

## 2016-02-18 DIAGNOSIS — F329 Major depressive disorder, single episode, unspecified: Secondary | ICD-10-CM | POA: Diagnosis present

## 2016-02-18 DIAGNOSIS — E249 Cushing's syndrome, unspecified: Secondary | ICD-10-CM | POA: Insufficient documentation

## 2016-02-18 DIAGNOSIS — E282 Polycystic ovarian syndrome: Secondary | ICD-10-CM | POA: Insufficient documentation

## 2016-02-18 DIAGNOSIS — E119 Type 2 diabetes mellitus without complications: Secondary | ICD-10-CM | POA: Diagnosis not present

## 2016-02-18 DIAGNOSIS — F17210 Nicotine dependence, cigarettes, uncomplicated: Secondary | ICD-10-CM | POA: Diagnosis not present

## 2016-02-18 DIAGNOSIS — L680 Hirsutism: Secondary | ICD-10-CM | POA: Diagnosis not present
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
## 2016-03-12 ENCOUNTER — Encounter (HOSPITAL_COMMUNITY): Payer: Self-pay | Admitting: *Deleted

## 2016-03-12 ENCOUNTER — Inpatient Hospital Stay (HOSPITAL_COMMUNITY)
Admission: AD | Admit: 2016-03-12 | Discharge: 2016-03-12 | Disposition: A | Discharge status: Home / Self Care | Payer: Medicaid Other | Source: Ambulatory Visit | Attending: Obstetrics & Gynecology | Admitting: Obstetrics & Gynecology

## 2016-03-12 DIAGNOSIS — Z79899 Other long term (current) drug therapy: Secondary | ICD-10-CM | POA: Diagnosis not present

## 2016-03-12 DIAGNOSIS — Z9889 Other specified postprocedural states: Secondary | ICD-10-CM | POA: Diagnosis not present

## 2016-03-12 DIAGNOSIS — B373 Candidiasis of vulva and vagina: Secondary | ICD-10-CM | POA: Insufficient documentation

## 2016-03-12 DIAGNOSIS — E282 Polycystic ovarian syndrome: Secondary | ICD-10-CM | POA: Diagnosis not present

## 2016-03-12 DIAGNOSIS — Z794 Long term (current) use of insulin: Secondary | ICD-10-CM | POA: Diagnosis not present

## 2016-03-12 DIAGNOSIS — F17210 Nicotine dependence, cigarettes, uncomplicated: Secondary | ICD-10-CM | POA: Diagnosis not present

## 2016-03-12 DIAGNOSIS — B3731 Acute candidiasis of vulva and vagina: Secondary | ICD-10-CM

## 2016-03-12 DIAGNOSIS — E119 Type 2 diabetes mellitus without complications: Secondary | ICD-10-CM | POA: Insufficient documentation

## 2016-03-12 LAB — URINALYSIS, ROUTINE W REFLEX MICROSCOPIC
BILIRUBIN URINE: NEGATIVE
Ketones, ur: NEGATIVE mg/dL
Nitrite: NEGATIVE
Protein, ur: NEGATIVE mg/dL
pH: 5.5 (ref 5.0–8.0)

## 2016-03-12 LAB — WET PREP, GENITAL
Clue Cells Wet Prep HPF POC: NONE SEEN
SPERM: NONE SEEN
Trich, Wet Prep: NONE SEEN

## 2016-03-12 LAB — GC/CHLAMYDIA PROBE AMP (~~LOC~~) NOT AT ARMC
CHLAMYDIA, DNA PROBE: NEGATIVE
NEISSERIA GONORRHEA: NEGATIVE

## 2016-03-12 LAB — URINE MICROSCOPIC-ADD ON

## 2016-03-12 LAB — POCT PREGNANCY, URINE: PREG TEST UR: NEGATIVE

## 2016-03-12 MED ORDER — TERCONAZOLE 0.4 % VA CREA: 1.0000 | TOPICAL_CREAM | Freq: Every day | VAGINAL | 0 refills | Status: DC

## 2016-03-12 NOTE — MAU Note (Signed)
PT  SAYS  SHE HAS VAG  D/C  X2 WEEKS-   IS A  DIABETIC-  SUPPOSE   TO BE  ON INSULIN-   BUT    DOES  NOT HAVE   MED  OR  DR   TO FOLLOW .  SAYS FEW  MTHS  AGO   HAD  A1C   .Marland Kitchen.  HAD  YEAST   INF  IN July-   CLEARED  WITH OTC  MEDS      HAS HAD  UNPROTECTED  SEX-       LAST SEX-   2 WEEKS  AGO.      HAS LOWER ABD  PAIN  - STARTED   Tuesday - TOOK  TYLENOL REG   2 TABS  -   SOME  RELIEF

## 2016-03-12 NOTE — MAU Provider Note (Signed)
History     CSN: [id number]  Arrival date and time: 03/12/16 0140   None     Chief Complaint  Patient presents with  . Vaginal Discharge   Vaginal Discharge  The patient's primary symptoms include genital itching and vaginal discharge. This is a new problem. The current episode started in the past 7 days. The problem occurs constantly. The problem has been unchanged. The patient is experiencing no pain. She is not pregnant. The vaginal discharge was thick and white. There has been no bleeding. Nothing aggravates the symptoms. Treatments tried: Used OTC yeast treatment, and it was better for a few weeks. However it has returned.  The treatment provided significant relief.    Past Medical History:  Diagnosis Date  . 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 Diabetes mellitus   . 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 Gonorrhea   . Hirsutism   . PCOS (polycystic ovarian syndrome)     Past Surgical History:  Procedure Laterality Date  . ABSCESS DRAINAGE    . TOOTH EXTRACTION      Family History  Problem Relation Age of Onset  . Epilepsy Father   . Cancer Father     lung cancer  . COPD Maternal Grandmother   . Hypertension Maternal Grandmother   . Heart disease Maternal Grandmother   . Cancer Paternal Grandfather     Social History  Substance Use Topics  . Smoking status: Current Every Day Smoker    Packs/day: 0.50    Types: Cigarettes  . Smokeless tobacco: Never Used  . Alcohol use 4.8 - 6.0 oz/week    3 - 4 Cans of beer, 5 - 6 Shots of liquor per week     Comment: socially    Allergies: No Known Allergies  Prescriptions Prior to Admission  Medication Sig Dispense Refill Last Dose  . ACCU-CHEK SOFTCLIX LANCETS lancets Check sugars ACHS for E11.9 100 each 12   . Blood Glucose Monitoring Suppl (ACCU-CHEK AVIVA PLUS) w/Device KIT Check sugars ACHS for E11.9 1 kit 0   . glucose blood (ACCU-CHEK AVIVA) test strip Check sugars ACHS for E11.9 100 each 12   . insulin aspart  (NOVOLOG) 100 UNIT/ML injection Inject 10 Units into the skin daily.   More than a month at Unknown time  . metFORMIN (GLUCOPHAGE) 500 MG tablet Take 1 tablet (500 mg total) by mouth 2 (two) times daily with a meal. IN 1 week switch to 1,000 mg twice daily (Patient not taking: Reported on 03/12/2016) 120 tablet 2 Not Taking at Unknown time    Review of Systems  Genitourinary: Positive for vaginal discharge.   Physical Exam   Blood pressure 118/64, pulse 82, temperature 98.8 F (37.1 C), temperature source Oral, resp. rate 20, height 5' 6" (1.676 m), weight 287 lb (130.2 kg), last menstrual period 02/12/2016.  Physical Exam  Nursing note and vitals reviewed. Constitutional: She is oriented to person, place, and time. She appears well-developed and well-nourished. No distress.  HENT:  Head: Normocephalic.  Cardiovascular: Normal rate.   Respiratory: Effort normal.  Musculoskeletal: Normal range of motion.  Neurological: She is alert and oriented to person, place, and time.  Skin: Skin is warm and dry.  Psychiatric: She has a normal mood and affect.     Results for orders placed or performed during the hospital encounter of 03/12/16 (from the past 24 hour(s))  Urinalysis, Routine w reflex microscopic (not at [facility])     Status: Abnormal   Collection Time: 03/12/16  1:40 AM  Result Value Ref Range   Color, Urine YELLOW YELLOW   APPearance CLEAR CLEAR   Specific Gravity, Urine <1.005 (L) 1.005 - 1.030   pH 5.5 5.0 - 8.0   Glucose, UA >1000 (A) NEGATIVE mg/dL   Hgb urine dipstick TRACE (A) NEGATIVE   Bilirubin Urine NEGATIVE NEGATIVE   Ketones, ur NEGATIVE NEGATIVE mg/dL   Protein, ur NEGATIVE NEGATIVE mg/dL   Nitrite NEGATIVE NEGATIVE   Leukocytes, UA SMALL (A) NEGATIVE  Urine microscopic-add on     Status: Abnormal   Collection Time: 03/12/16  1:40 AM  Result Value Ref Range   Squamous Epithelial / LPF 0-5 (A) NONE SEEN   WBC, UA 6-30 0 - 5 WBC/hpf   RBC / HPF 0-5 0 - 5 RBC/hpf    Bacteria, UA RARE (A) NONE SEEN  Wet prep, genital     Status: Abnormal   Collection Time: 03/12/16  1:50 AM  Result Value Ref Range   Yeast Wet Prep HPF POC PRESENT (A) NONE SEEN   Trich, Wet Prep NONE SEEN NONE SEEN   Clue Cells Wet Prep HPF POC NONE SEEN NONE SEEN   WBC, Wet Prep HPF POC MANY (A) NONE SEEN   Sperm NONE SEEN   Pregnancy, urine POC     Status: None   Collection Time: 03/12/16  1:54 AM  Result Value Ref Range   Preg Test, Ur NEGATIVE NEGATIVE     MAU Course  Procedures  MDM  Assessment and Plan   1. Yeast infection involving the vagina and surrounding area    DC home Comfort measures reviewed  RX: terazol 7 as directed  Return to MAU as needed   Follow-up [place] .   Contact information: [place] [id number] [phone]           Mathis Bud 03/12/2016, 2:31 AM

## 2016-03-12 NOTE — Discharge Instructions (Signed)

## 2016-03-19 ENCOUNTER — Emergency Department (HOSPITAL_COMMUNITY)
Admission: EM | Admit: 2016-03-19 | Discharge: 2016-03-20 | Disposition: A | Discharge status: Home / Self Care | Payer: BLUE CROSS/BLUE SHIELD | Attending: Emergency Medicine | Admitting: Emergency Medicine

## 2016-03-19 ENCOUNTER — Encounter (HOSPITAL_COMMUNITY): Payer: Self-pay

## 2016-03-19 DIAGNOSIS — Z794 Long term (current) use of insulin: Secondary | ICD-10-CM | POA: Insufficient documentation

## 2016-03-19 DIAGNOSIS — E1165 Type 2 diabetes mellitus with hyperglycemia: Secondary | ICD-10-CM | POA: Insufficient documentation

## 2016-03-19 DIAGNOSIS — F329 Major depressive disorder, single episode, unspecified: Secondary | ICD-10-CM | POA: Diagnosis not present

## 2016-03-19 DIAGNOSIS — Z7984 Long term (current) use of oral hypoglycemic drugs: Secondary | ICD-10-CM | POA: Diagnosis not present

## 2016-03-19 DIAGNOSIS — F17210 Nicotine dependence, cigarettes, uncomplicated: Secondary | ICD-10-CM | POA: Insufficient documentation

## 2016-03-19 DIAGNOSIS — R739 Hyperglycemia, unspecified: Secondary | ICD-10-CM

## 2016-03-19 DIAGNOSIS — F32A Depression, unspecified: Secondary | ICD-10-CM

## 2016-03-19 DIAGNOSIS — R45851 Suicidal ideations: Secondary | ICD-10-CM | POA: Diagnosis present

## 2016-03-19 LAB — CBC WITH DIFFERENTIAL/PLATELET
BASOS ABS: 0 10*3/uL (ref 0.0–0.1)
Basophils Relative: 0 %
Eosinophils Absolute: 0.1 10*3/uL (ref 0.0–0.7)
Eosinophils Relative: 1 %
HEMATOCRIT: 38.4 % (ref 36.0–46.0)
Hemoglobin: 13.3 g/dL (ref 12.0–15.0)
LYMPHS PCT: 28 %
Lymphs Abs: 1.8 10*3/uL (ref 0.7–4.0)
MCH: 30.6 pg (ref 26.0–34.0)
MCHC: 34.6 g/dL (ref 30.0–36.0)
MCV: 88.5 fL (ref 78.0–100.0)
MONO ABS: 0.5 10*3/uL (ref 0.1–1.0)
MONOS PCT: 7 %
NEUTROS ABS: 4.1 10*3/uL (ref 1.7–7.7)
Neutrophils Relative %: 64 %
PLATELETS: 213 10*3/uL (ref 150–400)
RBC: 4.34 MIL/uL (ref 3.87–5.11)
RDW: 12.1 % (ref 11.5–15.5)
WBC: 6.5 10*3/uL (ref 4.0–10.5)

## 2016-03-19 LAB — COMPREHENSIVE METABOLIC PANEL
ALBUMIN: 3.9 g/dL (ref 3.5–5.0)
ALT: 12 U/L — ABNORMAL LOW (ref 14–54)
AST: 12 U/L — AB (ref 15–41)
Alkaline Phosphatase: 91 U/L (ref 38–126)
Anion gap: 10 (ref 5–15)
BUN: 10 mg/dL (ref 6–20)
CHLORIDE: 102 mmol/L (ref 101–111)
CO2: 22 mmol/L (ref 22–32)
Calcium: 9 mg/dL (ref 8.9–10.3)
Creatinine, Ser: 0.71 mg/dL (ref 0.44–1.00)
GFR calc Af Amer: >60 mL/min (ref >60)
GFR calc non Af Amer: >60 mL/min (ref >60)
GLUCOSE: 437 mg/dL — AB (ref 65–99)
POTASSIUM: 4 mmol/L (ref 3.5–5.1)
Sodium: 134 mmol/L — ABNORMAL LOW (ref 135–145)
Total Bilirubin: 0.5 mg/dL (ref 0.3–1.2)
Total Protein: 8 g/dL (ref 6.5–8.1)

## 2016-03-19 LAB — ETHANOL: Alcohol, Ethyl (B): <5 mg/dL (ref <5)

## 2016-03-19 LAB — CBG MONITORING, ED
GLUCOSE-CAPILLARY: 433 mg/dL — AB (ref 65–99)
GLUCOSE-CAPILLARY: 317 mg/dL — AB (ref 65–99)
Glucose-Capillary: 339 mg/dL — ABNORMAL HIGH (ref 65–99)

## 2016-03-19 MED ORDER — INSULIN ASPART 100 UNIT/ML ~~LOC~~ SOLN
6.0000 [IU] | Freq: Once | SUBCUTANEOUS | Status: AC
  Administered 2016-03-19: 6 [IU] via INTRAVENOUS
  Filled 2016-03-19: qty 1

## 2016-03-19 MED ORDER — SODIUM CHLORIDE 0.9 % IV BOLUS (SEPSIS)
2000.0000 mL | Freq: Once | INTRAVENOUS | Status: AC
  Administered 2016-03-19: 2000 mL via INTRAVENOUS

## 2016-03-19 NOTE — ED Notes (Signed)
Bed: WTR9 Expected date:  Expected time:  Means of arrival:  Comments: 

## 2016-03-19 NOTE — ED Triage Notes (Addendum)
Patient states she has had elevated Blood sugars x 3 days, the highest being 500. Patient states she has been taking Metformin and Novolog. patient denies N/V, abdominal pain .  Patient reported at the end of the triage assessment that she was having suicidal thoughts, no plan. Patient states she is suppose to be on medication, but is unable to afford the medication.

## 2016-03-19 NOTE — Progress Notes (Addendum)
EDCM spoke to patient at bedside.  Patient listed as having Family Planning Medicaid.  She reports she has called her Child psychotherapistsocial worker at DSS 6 times and still hasn't gotten back to her regarding need for general Medicaid.  Patient reports she has insulin and metformin at home.  She reports she is running low on insulin.  She reports she has been taking it correctly.  She reports she does not have any difficulty getting to her appointments.  [facility].  [facility].  EDCM received patient's permission to [facility] in attempts to obtain an appointment.  EDCM also encouraged patient to speak to  financial counselor when seen [facility].  Patient verbalized understanding.  Patient with flat affect.  Sitter at bedside.  03/19/2016 A. Rhyse Loux RNCM 2209pm [facility] provided patient with discount card for Novalog insulin from [facility]. Discussed with EDPA.

## 2016-03-19 NOTE — ED Notes (Signed)
I attempted twice and was unsuccessful on the labs

## 2016-03-20 DIAGNOSIS — F329 Major depressive disorder, single episode, unspecified: Secondary | ICD-10-CM | POA: Diagnosis not present

## 2016-03-20 LAB — CBG MONITORING, ED
GLUCOSE-CAPILLARY: 241 mg/dL — AB (ref 65–99)
GLUCOSE-CAPILLARY: 246 mg/dL — AB (ref 65–99)

## 2016-03-20 MED ORDER — IBUPROFEN 200 MG PO TABS: 600.0000 mg | ORAL_TABLET | Freq: Three times a day (TID) | ORAL | Status: DC | PRN

## 2016-03-20 MED ORDER — INSULIN ASPART 100 UNIT/ML ~~LOC~~ SOLN: 0.0000 [IU] | Freq: Three times a day (TID) | SUBCUTANEOUS | Status: DC

## 2016-03-20 MED ORDER — ONDANSETRON HCL 4 MG PO TABS: 4.0000 mg | ORAL_TABLET | Freq: Three times a day (TID) | ORAL | Status: DC | PRN

## 2016-03-20 MED ORDER — ACETAMINOPHEN 500 MG PO TABS
1000.0000 mg | ORAL_TABLET | Freq: Once | ORAL | Status: AC
  Administered 2016-03-20: 1000 mg via ORAL
  Filled 2016-03-20: qty 2

## 2016-03-20 MED ORDER — INSULIN ASPART 100 UNIT/ML ~~LOC~~ SOLN
4.0000 [IU] | Freq: Once | SUBCUTANEOUS | Status: DC
  Filled 2016-03-20: qty 1

## 2016-03-20 MED ORDER — ZOLPIDEM TARTRATE 5 MG PO TABS: 5.0000 mg | ORAL_TABLET | Freq: Every evening | ORAL | Status: DC | PRN

## 2016-03-20 MED ORDER — METFORMIN HCL 500 MG PO TABS: 500.0000 mg | ORAL_TABLET | Freq: Two times a day (BID) | ORAL | Status: DC

## 2016-03-20 MED ORDER — ALUM & MAG HYDROXIDE-SIMETH 200-200-20 MG/5ML PO SUSP: 30.0000 mL | ORAL | Status: DC | PRN

## 2016-03-20 MED ORDER — INSULIN ASPART 100 UNIT/ML ~~LOC~~ SOLN
10.0000 [IU] | Freq: Every day | SUBCUTANEOUS | Status: DC
Start: 2016-03-20 — End: 2016-03-20
  Administered 2016-03-20: 10 [IU] via SUBCUTANEOUS

## 2016-03-20 MED ORDER — SODIUM CHLORIDE 0.9 % IV BOLUS (SEPSIS)
1000.0000 mL | Freq: Once | INTRAVENOUS | Status: AC
  Administered 2016-03-20: 1000 mL via INTRAVENOUS

## 2016-03-20 NOTE — ED Notes (Signed)
Counselor at bedside.

## 2016-03-20 NOTE — BH Assessment (Signed)
[facility] Assessment Progress Note   Patient was provided with brochures for [facility] [employer].

## 2016-03-20 NOTE — BH Assessment (Signed)
Assessment Note  [name] [name] is an 25 y.o. female who reports coming to [facility] for diabetic symptoms initially.  Patient presented orientated x4, mood "depressed", affect depressed, Patient denied current SI and reports last SI episode was at 1700.  Patient denied experiencing SI with plan or intent and denied any previous SI attempts or immediate family history of SI attempts.  Patient denied current or past HI or AVH.  Patient reports feeling depressed for the past 3 years.  She reports being diagnosed by a Psychiatrist in 2015 with Bipolar II and being prescribed medication at that time.  Patient reports stop prescribed medication in 2015, because she could not afford it.  Patient denied history of inpatient or outpatient mental health treatment.  Patient reports consuming 10 shots of Brandy and 4 (12) ounce beers every other weekend, with last use "3 to 4 weekends ago."  She denied any other illicit substance use.  Patient reports working full time and when she is not working will lay in bed.  She also reports not having any interest in actitivties outside of work.  Patient reports receiving 7 hours of sleep nightly with no change in sleep pattern or appetite.  Patient reports living alone with 25 year old daughter.  She reports her Mother is supportive and is currently keeping the Daughter.  Patient contracted for safety and reports she would like outpatient mental health resources that charge low or no fees for mental health treatment.  Patient reports plan after hospital discharge is to leave Daughter at [facility] house and go home and rest.  Patient reports Daughter attends daycare during the work week.  Patient does not meet inpatient criteria and will be provided with outpatient resources prior to discharge.      Diagnosis: Major Depressive Disorder, recurrent, moderate  Past Medical History:  Past Medical History:  Diagnosis Date  . Cushing syndrome (HCC)   . Diabetes mellitus   . 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 Gonorrhea   . Hirsutism   . PCOS (polycystic ovarian syndrome)     Past Surgical History:  Procedure Laterality Date  . ABSCESS DRAINAGE    . TOOTH EXTRACTION      Family History:  Family History  Problem Relation Age of Onset  . Epilepsy Father   . Cancer Father     lung cancer  . COPD Maternal Grandmother   . Hypertension Maternal Grandmother   . Heart disease Maternal Grandmother   . Cancer Paternal Grandfather     Social History:  reports that she has been smoking Cigarettes.  She has been smoking about 0.50 packs per day. She has never used smokeless tobacco. She reports that she drinks about 4.8 - 6.0 oz of alcohol per week . She reports that she does not use drugs.  Additional Social History:     CIWA: CIWA-Ar BP: 117/66 Pulse Rate: 63 COWS:    Allergies: No Known Allergies  Home Medications:  (Not in a hospital admission)  OB/GYN Status:  Patient's last menstrual period was 03/19/2016.  General Assessment Data Location of Assessment: [facility] ED TTS Assessment: In system Is this a Tele or Face-to-Face Assessment?: Face-to-Face Is this an Initial Assessment or a Re-assessment for this encounter?: Initial Assessment Marital status: Single Maiden name: Cyran Is patient pregnant?: No Pregnancy Status: No Living Arrangements: Children (Patient reports living with 28 year old Daughter) Can pt return to current living arrangement?: Yes Admission Status: Voluntary Is patient capable of signing voluntary admission?: Yes  Referral Source: Self/Family/Friend Insurance type: Medicaid for pregnancy (Patient reports not having Medicaid)  Medical Screening Exam [facility] Walk-[facility] ONLY) Medical Exam completed: Yes  Crisis Care Plan Living Arrangements: Children (Patient reports living with 26 year old Daughter) Legal Guardian: Other: (Self) Name of Psychiatrist: None Name of Therapist: None  Education Status Is patient currently in school?:  No Current Grade: N/A Highest grade of school patient has completed: 12th Name of school: N/A Contact person: N/A  Risk to self with the past 6 months Suicidal Ideation: No-Not Currently/Within Last 6 Months Has patient been a risk to self within the past 6 months prior to admission? : No Suicidal Intent: No Has patient had any suicidal intent within the past 6 months prior to admission? : No Is patient at risk for suicide?: No Suicidal Plan?: No Has patient had any suicidal plan within the past 6 months prior to admission? : No Access to Means: No What has been your use of drugs/alcohol within the last 12 months?: ETOH Previous Attempts/Gestures: No How many times?: 0 Other Self Harm Risks: No Triggers for Past Attempts: None known Intentional Self Injurious Behavior: None Family Suicide History: No Recent stressful life event(s): Other (Comment) (Uncontrolled Diabetes and single Parent) Persecutory voices/beliefs?: No Depression: Yes Depression Symptoms: Loss of interest in usual pleasures, Fatigue Substance abuse history and/or treatment for substance abuse?: Yes (ETOH) Suicide prevention information given to non-admitted patients: Not applicable  Risk to Others within the past 6 months Homicidal Ideation: No Does patient have any lifetime risk of violence toward others beyond the six months prior to admission? : No Thoughts of Harm to Others: No Current Homicidal Intent: No Current Homicidal Plan: No Access to Homicidal Means: No Identified Victim: N/A History of harm to others?: No Violent Behavior Description: None Does patient have access to weapons?: No (Patient denied) Criminal Charges Pending?: No Does patient have a court date: No Is patient on probation?: No  Psychosis Hallucinations: None noted Delusions: None noted  Mental Status Report Appearance/Hygiene: In hospital gown Eye Contact: Fair Motor Activity: Unremarkable Speech: Logical/coherent Level  of Consciousness: Alert Mood: Depressed Affect: Depressed Anxiety Level: Minimal Judgement: Unimpaired Orientation: Person, Place, Time, Situation Obsessive Compulsive Thoughts/Behaviors: None  Cognitive Functioning Concentration: Normal Memory: Recent Intact, Remote Intact IQ: Average Insight: Fair Impulse Control: Good Appetite: Good Weight Loss: 0 Weight Gain: 10 Sleep: No Change Total Hours of Sleep: 7 Vegetative Symptoms: Staying in bed (When she does not have to work)  ADLScreening [facility] Assessment Services) Patient's cognitive ability adequate to safely complete daily activities?: Yes Patient able to express need for assistance with ADLs?: Yes Independently performs ADLs?: Yes (appropriate for developmental age)  Prior Inpatient Therapy Prior Inpatient Therapy: No Prior Therapy Dates: N/A Prior Therapy Facilty/Provider(s): N/A Reason for Treatment: N/A  Prior Outpatient Therapy Prior Outpatient Therapy: No Prior Therapy Dates: N/A Prior Therapy Facilty/Provider(s): N/A Reason for Treatment: N/A Does patient have an ACCT team?: No Does patient have Intensive In-House Services?  : No Does patient have [facility] services? : No Does patient have P4CC services?: No  ADL Screening (condition at time of admission) Patient's cognitive ability adequate to safely complete daily activities?: Yes Is the patient deaf or have difficulty hearing?: No Does the patient have difficulty seeing, even when wearing glasses/contacts?: No Does the patient have difficulty concentrating, remembering, or making decisions?: No Patient able to express need for assistance with ADLs?: Yes Does the patient have difficulty dressing or bathing?: No Independently performs ADLs?: Yes (appropriate  for developmental age) Does the patient have difficulty walking or climbing stairs?: No Weakness of Legs: None Weakness of Arms/Hands: None  Home Assistive Devices/Equipment Home Assistive  Devices/Equipment: None    Abuse/Neglect Assessment (Assessment to be complete while patient is alone) Physical Abuse: Denies Verbal Abuse: Denies Sexual Abuse: Denies Exploitation of patient/patient's resources: Denies Self-Neglect: Denies Values / Beliefs Cultural Requests During Hospitalization: None Spiritual Requests During Hospitalization: None   Advance Directives (For Healthcare) Does patient have an advance directive?: No Would patient like information on creating an advanced directive?: No - patient declined information    Additional Information 1:1 In Past 12 Months?: No CIRT Risk: No Elopement Risk: No Does patient have medical clearance?: Yes     Disposition:  Disposition Initial Assessment Completed for this Encounter: Yes Disposition of Patient: Outpatient treatment Type of outpatient treatment: Adult  On Site Evaluation by:   Reviewed with Physician:    Dey-Johnson,Ulysees Robarts 03/20/2016 2:25 AM

## 2016-03-20 NOTE — ED Provider Notes (Signed)
[facility] DEPT Provider Note   CSN: [id number] Arrival date & time: 03/19/16  1656     History   Chief Complaint Chief Complaint  Patient presents with  . Hyperglycemia  . Fatigue  . Suicidal  . Medical Clearance    HPI [name] [name] is a 25 y.o. female.  HPI Patient presents to the emergency department with elevated blood sugars over the last 4 days.  Patient's also stating that she is having increasing depression and that she is suicidal and that started 2 days ago.  The patient states that she is not suicidal ideation in the past, but never attempted suicide.  The patient states that nothing seems make the condition better or worse.  Patient states that she did not take any medications prior to arrival other than her prescribed medication. The patient denies chest pain, shortness of breath, headache,blurred vision, neck pain, fever, cough, weakness, numbness, dizziness, anorexia, edema, abdominal pain, nausea, vomiting, diarrhea, rash, back pain, dysuria, hematemesis, bloody stool, near syncope, or syncope. Past Medical History:  Diagnosis Date  . Cushing syndrome ([place])   . Diabetes mellitus   . 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 Gonorrhea   . Hirsutism   . PCOS (polycystic ovarian syndrome)     Patient Active Problem List   Diagnosis Date Noted  . Other specified diabetes mellitus without complications ([place]) 28/78/6767  . Uncontrolled diabetes mellitus ([place]) 06/04/2013  . Diabetes mellitus during pregnancy, antepartum 01/08/2013  . Cushings syndrome ([place]) 01/08/2013  . PCOS (polycystic ovarian syndrome) 01/08/2013    Past Surgical History:  Procedure Laterality Date  . ABSCESS DRAINAGE    . TOOTH EXTRACTION      OB History    Gravida Para Term Preterm AB Living   _0 0 1 1   SAB TAB Ectopic Multiple Live Births   1 0 0 0 1       Home Medications    Prior to Admission medications   Medication Sig Start Date End Date Taking? Authorizing  Provider  insulin aspart (NOVOLOG) 100 UNIT/ML injection Inject 10 Units into the skin at bedtime.    Yes Historical Provider, MD  metFORMIN (GLUCOPHAGE) 500 MG tablet Take 1 tablet (500 mg total) by mouth 2 (two) times daily with a meal. IN 1 week switch to 1,000 mg twice daily Patient taking differently: Take 1,000 mg by mouth 2 (two) times daily with a meal. IN 1 week switch to 1,000 mg twice daily 09/26/15  Yes Lance Bosch, NP  ACCU-CHEK SOFTCLIX LANCETS lancets Check sugars ACHS for E11.9 09/26/15   Lance Bosch, NP  Blood Glucose Monitoring Suppl (ACCU-CHEK AVIVA PLUS) w/Device KIT Check sugars ACHS for E11.9 09/26/15   Lance Bosch, NP  glucose blood (ACCU-CHEK AVIVA) test strip Check sugars ACHS for E11.9 09/26/15   Lance Bosch, NP  terconazole (TERAZOL 7) 0.4 % vaginal cream Place 1 applicator vaginally at bedtime. 03/12/16   Tresea Mall, CNM    Family History Family History  Problem Relation Age of Onset  . Epilepsy Father   . Cancer Father     lung cancer  . COPD Maternal Grandmother   . Hypertension Maternal Grandmother   . Heart disease Maternal Grandmother   . Cancer Paternal Grandfather     Social History Social History  Substance Use Topics  . Smoking status: Current Every Day Smoker    Packs/day: 0.50    Types: Cigarettes  . Smokeless tobacco: Never Used  .  Alcohol use 4.8 - 6.0 oz/week    3 - 4 Cans of beer, 5 - 6 Shots of liquor per week     Comment: socially     Allergies   Review of patient's allergies indicates no known allergies.   Review of Systems Review of Systems  All other systems negative except as documented in the HPI. All pertinent positives and negatives as reviewed in the HPI. Physical Exam Updated Vital Signs BP 127/88   Pulse 62   Temp 98.3 F (36.8 C) (Oral)   Resp 15   Ht _0  (1.702 m)   Wt 127 kg   LMP 03/19/2016   SpO2 98%   BMI 43.85 kg/m   Physical Exam  Constitutional: She is oriented to person, place, and  time. She appears well-developed and well-nourished. No distress.  HENT:  Head: Normocephalic and atraumatic.  Mouth/Throat: Oropharynx is clear and moist.  Eyes: Pupils are equal, round, and reactive to light.  Neck: Normal range of motion. Neck supple.  Cardiovascular: Normal rate, regular rhythm and normal heart sounds.  Exam reveals no gallop and no friction rub.   No murmur heard. Pulmonary/Chest: Effort normal and breath sounds normal. No respiratory distress. She has no wheezes.  Abdominal: Soft. Bowel sounds are normal. She exhibits no distension. There is no tenderness.  Neurological: She is alert and oriented to person, place, and time. She exhibits normal muscle tone. Coordination normal.  Skin: Skin is warm and dry. No rash noted. No erythema.  Psychiatric: She has a normal mood and affect. Her speech is normal and behavior is normal. Judgment normal. Cognition and memory are normal. She expresses suicidal ideation. She expresses no suicidal plans and no homicidal plans.  Nursing note and vitals reviewed.    ED Treatments / Results  Labs (all labs ordered are listed, but only abnormal results are displayed) Labs Reviewed  COMPREHENSIVE METABOLIC PANEL - Abnormal; Notable for the following:       Result Value   Sodium 134 (*)    Glucose, Bld 437 (*)    AST 12 (*)    ALT 12 (*)    All other components within normal limits  CBG MONITORING, ED - Abnormal; Notable for the following:    Glucose-Capillary 433 (*)    All other components within normal limits  CBG MONITORING, ED - Abnormal; Notable for the following:    Glucose-Capillary 339 (*)    All other components within normal limits  CBG MONITORING, ED - Abnormal; Notable for the following:    Glucose-Capillary 317 (*)    All other components within normal limits  ETHANOL  CBC WITH DIFFERENTIAL/PLATELET  URINE RAPID DRUG SCREEN, HOSP PERFORMED    EKG  EKG Interpretation None       Radiology No results  found.  Procedures Procedures (including critical care time)  Medications Ordered in ED Medications  sodium chloride 0.9 % bolus 2,000 mL (2,000 mLs Intravenous New Bag/Given 03/19/16 2054)  insulin aspart (novoLOG) injection 6 Units (6 Units Intravenous Given 03/19/16 2054)     Initial Impression / Assessment and Plan / ED Course  I have reviewed the triage vital signs and the nursing notes.  Pertinent labs & imaging results that were available during my care of the patient were reviewed by me and considered in my medical decision making (see chart for details).  Clinical Course    The patient will need TTS assessment for suicidal ideation  Final Clinical Impressions(s) / ED Diagnoses  Final diagnoses:  None    New Prescriptions New Prescriptions   No medications on file     Dalia Heading, PA-C 03/20/16 0029    Tanna Furry, MD 03/23/16 1450

## 2016-03-20 NOTE — ED Provider Notes (Signed)
TTS consultation appreciated. Patient does not meet inpatient criteria. I've gone to interview the patient and she does admit to ongoing depression but denies active suicidal thoughts. Blood glucose has come down with satisfactory range. She is discharged with outpatient behavioral health resources.   Dione Boozeavid Ignacia Gentzler, MD 03/20/16 [id number]

## 2016-03-23 ENCOUNTER — Telehealth: Payer: Self-pay

## 2016-03-23 NOTE — Telephone Encounter (Signed)
Message received from Radford PaxAmy Ferrero, RN CM requesting an [facility]. Call placed to # [phone] ([phone] and a HIPAA compliant voicemail message was left requesting a call back to # [phone] or [phone].   Update provided to A. Bennie DallasFerrero, RN CM

## 2016-03-26 ENCOUNTER — Telehealth: Payer: Self-pay

## 2016-03-26 NOTE — Telephone Encounter (Signed)
This Case Production designer, theatre/television/filmManager received communication from Radford PaxAmy Ferrero, RN CM at [facility] Call placed to patient at #[phone].  Patient agreeable to ED follow-up appointment, and appointment scheduled for 04/01/16 at 1130. Patient informed of clinic's address, contact information.

## 2016-04-01 ENCOUNTER — Inpatient Hospital Stay: Payer: Self-pay

## 2016-05-28 ENCOUNTER — Emergency Department (HOSPITAL_COMMUNITY)
Admission: EM | Admit: 2016-05-28 | Discharge: 2016-05-29 | Disposition: A | Discharge status: Home / Self Care | Payer: Medicaid Other | Attending: Emergency Medicine | Admitting: Emergency Medicine

## 2016-05-28 ENCOUNTER — Encounter (HOSPITAL_COMMUNITY): Payer: Self-pay | Admitting: Emergency Medicine

## 2016-05-28 DIAGNOSIS — Z79899 Other long term (current) drug therapy: Secondary | ICD-10-CM | POA: Insufficient documentation

## 2016-05-28 DIAGNOSIS — R202 Paresthesia of skin: Secondary | ICD-10-CM | POA: Diagnosis not present

## 2016-05-28 DIAGNOSIS — Z794 Long term (current) use of insulin: Secondary | ICD-10-CM | POA: Insufficient documentation

## 2016-05-28 DIAGNOSIS — F17210 Nicotine dependence, cigarettes, uncomplicated: Secondary | ICD-10-CM | POA: Diagnosis not present

## 2016-05-28 DIAGNOSIS — E1165 Type 2 diabetes mellitus with hyperglycemia: Secondary | ICD-10-CM | POA: Insufficient documentation

## 2016-05-28 DIAGNOSIS — R739 Hyperglycemia, unspecified: Secondary | ICD-10-CM

## 2016-05-28 LAB — CBC WITH DIFFERENTIAL/PLATELET
BASOS PCT: 0 %
Basophils Absolute: 0 10*3/uL (ref 0.0–0.1)
EOS ABS: 0.1 10*3/uL (ref 0.0–0.7)
EOS PCT: 1 %
HCT: 38.4 % (ref 36.0–46.0)
Hemoglobin: 12.9 g/dL (ref 12.0–15.0)
Lymphocytes Relative: 25 %
Lymphs Abs: 1.9 10*3/uL (ref 0.7–4.0)
MCH: 30.8 pg (ref 26.0–34.0)
MCHC: 33.6 g/dL (ref 30.0–36.0)
MCV: 91.6 fL (ref 78.0–100.0)
MONO ABS: 0.5 10*3/uL (ref 0.1–1.0)
MONOS PCT: 6 %
NEUTROS PCT: 68 %
Neutro Abs: 5.1 10*3/uL (ref 1.7–7.7)
Platelets: 231 10*3/uL (ref 150–400)
RBC: 4.19 MIL/uL (ref 3.87–5.11)
RDW: 12.2 % (ref 11.5–15.5)
WBC: 7.5 10*3/uL (ref 4.0–10.5)

## 2016-05-28 LAB — COMPREHENSIVE METABOLIC PANEL
ALK PHOS: 80 U/L (ref 38–126)
ALT: 11 U/L — AB (ref 14–54)
AST: 10 U/L — ABNORMAL LOW (ref 15–41)
Albumin: 4 g/dL (ref 3.5–5.0)
Anion gap: 10 (ref 5–15)
BUN: 12 mg/dL (ref 6–20)
CALCIUM: 9.2 mg/dL (ref 8.9–10.3)
CO2: 23 mmol/L (ref 22–32)
CREATININE: 0.72 mg/dL (ref 0.44–1.00)
Chloride: 103 mmol/L (ref 101–111)
GFR calc non Af Amer: >60 mL/min (ref >60)
GLUCOSE: 389 mg/dL — AB (ref 65–99)
Potassium: 3.9 mmol/L (ref 3.5–5.1)
SODIUM: 136 mmol/L (ref 135–145)
Total Bilirubin: 0.8 mg/dL (ref 0.3–1.2)
Total Protein: 7.9 g/dL (ref 6.5–8.1)

## 2016-05-28 LAB — BLOOD GAS, VENOUS
ACID-BASE EXCESS: 2.4 mmol/L — AB (ref 0.0–2.0)
BICARBONATE: 26.9 mmol/L (ref 20.0–28.0)
FIO2: 21
O2 Saturation: 83.6 %
PATIENT TEMPERATURE: 37
PO2 VEN: 50.3 mmHg — AB (ref 32.0–45.0)
pCO2, Ven: 43.2 mmHg — ABNORMAL LOW (ref 44.0–60.0)
pH, Ven: 7.41 (ref 7.250–7.430)

## 2016-05-28 LAB — CBG MONITORING, ED: Glucose-Capillary: 346 mg/dL — ABNORMAL HIGH (ref 65–99)

## 2016-05-28 MED ORDER — SODIUM CHLORIDE 0.9 % IV BOLUS (SEPSIS)
1000.0000 mL | Freq: Once | INTRAVENOUS | Status: AC
  Administered 2016-05-28: 1000 mL via INTRAVENOUS

## 2016-05-28 NOTE — ED Triage Notes (Signed)
Patient complaining of having high blood sugar and numbness in left arm from elbow to fingers. Patient did not have any injury to arm. Patient has not taken any medicine due to not having insurance. It has been months since she took her diabetic medicine.

## 2016-05-28 NOTE — ED Provider Notes (Signed)
[place] DEPT Provider Note   CSN: [id number] Arrival date & time: 05/28/16  2131     History   Chief Complaint Chief Complaint  Patient presents with  . Numbness  . Hyperglycemia    HPI [name] [name] is a 25 y.o. female.  The history is provided by the patient.  Hyperglycemia  Blood sugar level PTA:  Not checking them Duration: unsure. Timing:  Constant Chronicity:  Chronic Diabetes status:  Controlled with insulin and controlled with oral medications Current diabetic therapy:  Supposed to be on novolog and metformin but has not been on it for 1 month due to finacial constraints Context: noncompliance   Relieved by:  Nothing Associated symptoms: no abdominal pain, no chest pain, no confusion, no dehydration, no diaphoresis, no dizziness, no dysuria, no fatigue, no fever, no increased thirst, no nausea, no polyuria, no shortness of breath, no syncope, no vomiting and no weakness    She reports numbness of the LUE from elbow down. Similar to prior episodes of hyperglycemia.   Past Medical History:  Diagnosis Date  . 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 Diabetes mellitus   . 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 Gonorrhea   . Hirsutism   . PCOS (polycystic ovarian syndrome)     Patient Active Problem List   Diagnosis Date Noted  . Other specified diabetes mellitus without complications 62/70/3500  . Uncontrolled diabetes mellitus ([place]) 06/04/2013  . Diabetes mellitus during pregnancy, antepartum 01/08/2013  . Cushings syndrome ([place]) 01/08/2013  . PCOS (polycystic ovarian syndrome) 01/08/2013    Past Surgical History:  Procedure Laterality Date  . ABSCESS DRAINAGE    . TOOTH EXTRACTION      OB History    Gravida Para Term Preterm AB Living   2 1 1  0 1 1   SAB TAB Ectopic Multiple Live Births   1 0 0 0 1       Home Medications    Prior to Admission medications   Medication Sig Start Date End Date Taking? Authorizing Provider  insulin aspart  (NOVOLOG) 100 UNIT/ML injection Inject 10 Units into the skin at bedtime.    Yes Historical Provider, MD  metFORMIN (GLUCOPHAGE) 500 MG tablet Take 1 tablet (500 mg total) by mouth 2 (two) times daily with a meal. IN 1 week switch to 1,000 mg twice daily Patient taking differently: Take 1,000 mg by mouth 2 (two) times daily with a meal. IN 1 week switch to 1,000 mg twice daily 09/26/15  Yes Lance Bosch, NP  ACCU-CHEK SOFTCLIX LANCETS lancets Check sugars ACHS for E11.9 09/26/15   Lance Bosch, NP  Blood Glucose Monitoring Suppl (ACCU-CHEK AVIVA PLUS) w/Device KIT Check sugars ACHS for E11.9 09/26/15   Lance Bosch, NP  glucose blood (ACCU-CHEK AVIVA) test strip Check sugars ACHS for E11.9 09/26/15   Lance Bosch, NP  terconazole (TERAZOL 7) 0.4 % vaginal cream Place 1 applicator vaginally at bedtime. Patient not taking: Reported on 05/28/2016 03/12/16   Tresea Mall, CNM    Family History Family History  Problem Relation Age of Onset  . Epilepsy Father   . Cancer Father     lung cancer  . COPD Maternal Grandmother   . Hypertension Maternal Grandmother   . Heart disease Maternal Grandmother   . Cancer Paternal Grandfather     Social History Social History  Substance Use Topics  . Smoking status: Current Every Day Smoker    Packs/day: 0.50    Types:  Cigarettes  . Smokeless tobacco: Never Used  . Alcohol use 4.8 - 6.0 oz/week    3 - 4 Cans of beer, 5 - 6 Shots of liquor per week     Comment: socially     Allergies   Patient has no known allergies.   Review of Systems Review of Systems  Constitutional: Negative for chills, diaphoresis, fatigue and fever.  HENT: Negative for ear pain and sore throat.   Eyes: Negative for pain and visual disturbance.  Respiratory: Negative for cough and shortness of breath.   Cardiovascular: Negative for chest pain, palpitations and syncope.  Gastrointestinal: Negative for abdominal pain, nausea and vomiting.  Endocrine: Negative for  polydipsia and polyuria.  Genitourinary: Negative for dysuria and hematuria.  Musculoskeletal: Negative for arthralgias and back pain.  Skin: Negative for color change and rash.  Neurological: Positive for numbness. Negative for dizziness, seizures, syncope and weakness.  Psychiatric/Behavioral: Negative for confusion.  All other systems reviewed and are negative.    Physical Exam Updated Vital Signs BP 118/96 (BP Location: Left Arm)   Pulse 78   Temp 98.3 F (36.8 C) (Oral)   Resp 18   Ht 5' 7"  (1.702 m)   Wt 280 lb (127 kg)   LMP 05/24/2016 (Approximate)   SpO2 100%   BMI 43.85 kg/m   Physical Exam  Constitutional: She is oriented to person, place, and time. She appears well-developed and well-nourished. No distress.  HENT:  Head: Normocephalic and atraumatic.  Nose: Nose normal.  Eyes: Conjunctivae and EOM are normal. Pupils are equal, round, and reactive to light. Right eye exhibits no discharge. Left eye exhibits no discharge. No scleral icterus.  Neck: Normal range of motion. Neck supple.  Cardiovascular: Normal rate and regular rhythm.  Exam reveals no gallop and no friction rub.   No murmur heard. Pulmonary/Chest: Effort normal and breath sounds normal. No stridor. No respiratory distress. She has no rales.  Abdominal: Soft. She exhibits no distension. There is no tenderness.  Musculoskeletal: She exhibits no edema or tenderness.  Neurological: She is alert and oriented to person, place, and time. She has normal strength. No sensory deficit.  Skin: Skin is warm and dry. No rash noted. She is not diaphoretic. No erythema.  Psychiatric: She has a normal mood and affect.  Vitals reviewed.    ED Treatments / Results  Labs (all labs ordered are listed, but only abnormal results are displayed) Labs Reviewed  COMPREHENSIVE METABOLIC PANEL - Abnormal; Notable for the following:       Result Value   Glucose, Bld 389 (*)    AST 10 (*)    ALT 11 (*)    All other  components within normal limits  BLOOD GAS, VENOUS - Abnormal; Notable for the following:    pCO2, Ven 43.2 (*)    pO2, Ven 50.3 (*)    Acid-Base Excess 2.4 (*)    All other components within normal limits  CBG MONITORING, ED - Abnormal; Notable for the following:    Glucose-Capillary 346 (*)    All other components within normal limits  CBG MONITORING, ED - Abnormal; Notable for the following:    Glucose-Capillary 349 (*)    All other components within normal limits  CBC WITH DIFFERENTIAL/PLATELET  URINALYSIS, ROUTINE W REFLEX MICROSCOPIC (NOT AT [facility])    EKG  EKG Interpretation None       Radiology No results found.  Procedures Procedures (including critical care time)  Medications Ordered in ED Medications  sodium chloride 0.9 % bolus 1,000 mL (not administered)  insulin aspart (novoLOG) injection 8 Units (not administered)  sodium chloride 0.9 % bolus 1,000 mL (1,000 mLs Intravenous New Bag/Given 05/28/16 2322)     Initial Impression / Assessment and Plan / ED Course  I have reviewed the triage vital signs and the nursing notes.  Pertinent labs & imaging results that were available during my care of the patient were reviewed by me and considered in my medical decision making (see chart for details).  Clinical Course     No evidence of DKA or HHS with labs. Patient given IV fluid bolus with minimal improvement in glucose. Additional bolus given. Patient also given 8 units of insulin. We'll recheck.  Consulted and case management for assistance with obtaining her diabetic medication.  Patient care turned over to Dr Roxanne Mins at 0370. Patient case and results discussed in detail; please see their note for further ED managment.    Final Clinical Impressions(s) / ED Diagnoses   Final diagnoses:  Hyperglycemia  Paresthesias      Fatima Blank, MD 05/29/16 [phone]

## 2016-05-28 NOTE — ED Notes (Signed)
Pt. Reminded for urinalysis, verbalized understanding .

## 2016-05-29 LAB — CBG MONITORING, ED
GLUCOSE-CAPILLARY: 349 mg/dL — AB (ref 65–99)
Glucose-Capillary: 290 mg/dL — ABNORMAL HIGH (ref 65–99)

## 2016-05-29 MED ORDER — SODIUM CHLORIDE 0.9 % IV BOLUS (SEPSIS)
1000.0000 mL | Freq: Once | INTRAVENOUS | Status: AC
  Administered 2016-05-29: 1000 mL via INTRAVENOUS

## 2016-05-29 MED ORDER — INSULIN ASPART 100 UNIT/ML ~~LOC~~ SOLN
8.0000 [IU] | Freq: Once | SUBCUTANEOUS | Status: AC
  Administered 2016-05-29: 8 [IU] via SUBCUTANEOUS
  Filled 2016-05-29: qty 1

## 2016-05-29 MED ORDER — INSULIN ASPART 100 UNIT/ML IV SOLN
8.0000 [IU] | Freq: Once | INTRAVENOUS | Status: AC
  Administered 2016-05-29: 8 [IU] via INTRAVENOUS
  Filled 2016-05-29: qty 0.08

## 2016-05-29 NOTE — ED Provider Notes (Signed)
25 year old female presented with hyperglycemia and apparently some difficulty with medication compliance. She is received IV fluids and insulin and blood glucoses come down under 300. Patient states that as she does not have a PCP. Review of records shows that [facility] and was supposed to go for follow-up but apparently he missed that appointment. She needs to get reestablished there. She is given financial assistance resources.   Dione Boozeavid Jules Vidovich, MD 05/29/16 [phone]

## 2016-07-12 ENCOUNTER — Encounter (HOSPITAL_COMMUNITY): Payer: Self-pay | Admitting: Certified Nurse Midwife

## 2016-07-12 ENCOUNTER — Inpatient Hospital Stay (HOSPITAL_COMMUNITY)
Admission: AD | Admit: 2016-07-12 | Discharge: 2016-07-12 | Disposition: A | Discharge status: Home / Self Care | Payer: Medicaid Other | Source: Ambulatory Visit | Attending: Obstetrics and Gynecology | Admitting: Obstetrics and Gynecology

## 2016-07-12 DIAGNOSIS — Z8619 Personal history of other infectious and parasitic diseases: Secondary | ICD-10-CM | POA: Insufficient documentation

## 2016-07-12 DIAGNOSIS — E1165 Type 2 diabetes mellitus with hyperglycemia: Secondary | ICD-10-CM

## 2016-07-12 DIAGNOSIS — Z836 Family history of other diseases of the respiratory system: Secondary | ICD-10-CM | POA: Insufficient documentation

## 2016-07-12 DIAGNOSIS — Z820 Family history of epilepsy and other diseases of the nervous system: Secondary | ICD-10-CM | POA: Insufficient documentation

## 2016-07-12 DIAGNOSIS — Z801 Family history of malignant neoplasm of trachea, bronchus and lung: Secondary | ICD-10-CM | POA: Insufficient documentation

## 2016-07-12 DIAGNOSIS — Z8249 Family history of ischemic heart disease and other diseases of the circulatory system: Secondary | ICD-10-CM | POA: Insufficient documentation

## 2016-07-12 DIAGNOSIS — E282 Polycystic ovarian syndrome: Secondary | ICD-10-CM | POA: Insufficient documentation

## 2016-07-12 DIAGNOSIS — N760 Acute vaginitis: Secondary | ICD-10-CM

## 2016-07-12 DIAGNOSIS — Z3202 Encounter for pregnancy test, result negative: Secondary | ICD-10-CM | POA: Insufficient documentation

## 2016-07-12 DIAGNOSIS — E119 Type 2 diabetes mellitus without complications: Secondary | ICD-10-CM | POA: Insufficient documentation

## 2016-07-12 DIAGNOSIS — Z809 Family history of malignant neoplasm, unspecified: Secondary | ICD-10-CM | POA: Insufficient documentation

## 2016-07-12 DIAGNOSIS — IMO0001 Reserved for inherently not codable concepts without codable children: Secondary | ICD-10-CM

## 2016-07-12 DIAGNOSIS — Z794 Long term (current) use of insulin: Secondary | ICD-10-CM | POA: Insufficient documentation

## 2016-07-12 DIAGNOSIS — B379 Candidiasis, unspecified: Secondary | ICD-10-CM

## 2016-07-12 DIAGNOSIS — B373 Candidiasis of vulva and vagina: Secondary | ICD-10-CM

## 2016-07-12 DIAGNOSIS — F17210 Nicotine dependence, cigarettes, uncomplicated: Secondary | ICD-10-CM | POA: Insufficient documentation

## 2016-07-12 DIAGNOSIS — B9689 Other specified bacterial agents as the cause of diseases classified elsewhere: Secondary | ICD-10-CM | POA: Insufficient documentation

## 2016-07-12 LAB — WET PREP, GENITAL
Sperm: NONE SEEN
Trich, Wet Prep: NONE SEEN

## 2016-07-12 LAB — GC/CHLAMYDIA PROBE AMP (~~LOC~~) NOT AT ARMC
Chlamydia: NEGATIVE
NEISSERIA GONORRHEA: NEGATIVE

## 2016-07-12 LAB — URINALYSIS, ROUTINE W REFLEX MICROSCOPIC
BILIRUBIN URINE: NEGATIVE
Bacteria, UA: NONE SEEN
HGB URINE DIPSTICK: NEGATIVE
KETONES UR: 20 mg/dL — AB
Nitrite: NEGATIVE
PH: 6 (ref 5.0–8.0)
Protein, ur: NEGATIVE mg/dL
Specific Gravity, Urine: 1.03 (ref 1.005–1.030)

## 2016-07-12 LAB — POCT PREGNANCY, URINE: PREG TEST UR: NEGATIVE

## 2016-07-12 MED ORDER — TERCONAZOLE 0.4 % VA CREA
1.0000 | TOPICAL_CREAM | Freq: Every day | VAGINAL | 0 refills | Status: DC
Start: 2016-07-12 — End: 2016-07-28

## 2016-07-12 MED ORDER — METFORMIN HCL 500 MG PO TABS: 1000.0000 mg | ORAL_TABLET | Freq: Two times a day (BID) | ORAL | 0 refills | Status: DC

## 2016-07-12 MED ORDER — FLUCONAZOLE 150 MG PO TABS: 150.0000 mg | ORAL_TABLET | Freq: Once | ORAL | 0 refills | Status: AC

## 2016-07-12 MED ORDER — METRONIDAZOLE 500 MG PO TABS: 500.0000 mg | ORAL_TABLET | Freq: Two times a day (BID) | ORAL | 0 refills | Status: DC

## 2016-07-12 NOTE — Discharge Instructions (Signed)

## 2016-07-12 NOTE — MAU Note (Signed)
Pt c/o possible yeast infections. States she has a lot of vaginal discharge that itches and is very irritating. Denies vag bleeding. LMP: mid December. Has sharp right sided pain that is constant that started with the irritation. Rates 8/10. Did not take anything.

## 2016-07-12 NOTE — MAU Provider Note (Signed)
History   [id number]   Chief Complaint  Patient presents with  . Vaginal Discharge    HPI [name] [name] is a 26 y.o. female  G2P1011 here with report of vaginal itching x 2 days and +white discharge.  LMP 06-25-16.  No report of additional symptoms.      No LMP recorded. Patient is not currently having periods (Reason: Irregular Periods).  OB History  Gravida Para Term Preterm AB Living  _0 0 1 1  SAB TAB Ectopic Multiple Live Births  1 0 0 0 1    # Outcome Date GA Lbr Len/2nd Weight Sex Delivery Anes PTL Lv  2 Term 07/20/13 [redacted]w[redacted]d/ 03:27 9 lb 9.1 oz (4.34 kg) F Vag-Spont EPI  LIV     Birth Comments: 9 lbs. 9 oz. Infant born at 348 weeksgestational age to a 27year old g 2 p 0 0 1 0 female. Gestation was complicated by in a controlled  class B. insulin dependent diabetes mellitus, polycystic ovary syndrome, Cushing syndrome Mother received General anesthesia primary cesarean section due to  loss of heart rate prior to delivery and thick meconium Nursery Course was complicated by severe hypoxic ischemic insult at birth requiring resuscitation,  treatment with therapeutic hypothermia. Growth and Development was recalled as  mild truncal hypotonia according to physical therapy.  1 SAB 09/2012 [id number]          Birth Comments: System Generated. Please review and update pregnancy details.      Past Medical History:  Diagnosis Date  . Cushing syndrome ([place]  . Diabetes mellitus   . 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 Gonorrhea   . Hirsutism   . PCOS (polycystic ovarian syndrome)     Family History  Problem Relation Age of Onset  . Epilepsy Father   . Cancer Father     lung cancer  . COPD Maternal Grandmother   . Hypertension Maternal Grandmother   . Heart disease Maternal Grandmother   . Cancer Paternal Grandfather     Social History   Social History  . Marital status: Single    Spouse name: N/A  . Number of children: N/A  . Years of education: N/A    Social History Main Topics  . Smoking status: Current Every Day Smoker    Packs/day: 0.50    Types: Cigarettes  . Smokeless tobacco: Never Used  . Alcohol use 4.8 - 6.0 oz/week    3 - 4 Cans of beer, 5 - 6 Shots of liquor per week     Comment: socially  . Drug use: No  . Sexual activity: Yes    Partners: Female    Birth control/ protection: None   Other Topics Concern  . None   Social History Narrative  . None    No Known Allergies  No current facility-administered medications on file prior to encounter.    Current Outpatient Prescriptions on File Prior to Encounter  Medication Sig Dispense Refill  . ACCU-CHEK SOFTCLIX LANCETS lancets Check sugars ACHS for E11.9 100 each 12  . Blood Glucose Monitoring Suppl (ACCU-CHEK AVIVA PLUS) w/Device KIT Check sugars ACHS for E11.9 1 kit 0  . glucose blood (ACCU-CHEK AVIVA) test strip Check sugars ACHS for E11.9 100 each 12  . insulin aspart (NOVOLOG) 100 UNIT/ML injection Inject 10 Units into the skin at bedtime.     . metFORMIN (GLUCOPHAGE) 500 MG tablet Take 1 tablet (500 mg total) by mouth 2 (  two) times daily with a meal. IN 1 week switch to 1,000 mg twice daily (Patient taking differently: Take 1,000 mg by mouth 2 (two) times daily with a meal. IN 1 week switch to 1,000 mg twice daily) 120 tablet 2     Review of Systems  Constitutional: Negative for chills and fever.  Gastrointestinal: Negative for abdominal pain, nausea and vomiting.  Genitourinary: Positive for vaginal discharge and vaginal pain. Negative for pelvic pain and vaginal bleeding.  All other systems reviewed and are negative.    Physical Exam   Vitals:   07/12/16 0053  BP: 142/65  Pulse: 96  Resp: 19  Temp: 97.8 F (36.6 C)  TempSrc: Oral    Physical Exam  Constitutional: She is oriented to person, place, and time. She appears well-developed and well-nourished. No distress.  HENT:  Head: Normocephalic.  Neck: Normal range of motion. Neck supple.   Cardiovascular: Normal rate and regular rhythm.   Respiratory: Effort normal and breath sounds normal.  GI: Soft. There is no tenderness.  Genitourinary: Cervix exhibits no motion tenderness. There is erythema in the vagina. Vaginal discharge (white discharge) found.  Neurological: She is alert and oriented to person, place, and time.  Skin: Skin is warm and dry.    MAU Course  Procedures  MDM Results for orders placed or performed during the hospital encounter of 07/12/16 (from the past 24 hour(s))  Wet prep, genital     Status: Abnormal   Collection Time: 07/12/16  1:06 AM  Result Value Ref Range   Yeast Wet Prep HPF POC PRESENT (A) NONE SEEN   Trich, Wet Prep NONE SEEN NONE SEEN   Clue Cells Wet Prep HPF POC PRESENT (A) NONE SEEN   WBC, Wet Prep HPF POC MANY (A) NONE SEEN   Sperm NONE SEEN   Pregnancy, urine POC     Status: None   Collection Time: 07/12/16  1:20 AM  Result Value Ref Range   Preg Test, Ur NEGATIVE NEGATIVE     Assessment and Plan  Yeast Infection Bacterial Vaginosis  Plan: Discharge home RX Flagyl 500 mg BID x 7 days RX Diflucan 150 mg po Follow-up if no improvement or worsening   Gwen Pounds, CNM 07/12/2016 1:34 AM

## 2016-07-15 ENCOUNTER — Encounter: Payer: Self-pay | Admitting: Licensed Clinical Social Worker

## 2016-07-15 ENCOUNTER — Other Ambulatory Visit: Payer: Self-pay | Admitting: Family Medicine

## 2016-07-15 ENCOUNTER — Ambulatory Visit: Payer: Medicaid Other | Attending: Family Medicine | Admitting: Family Medicine

## 2016-07-15 ENCOUNTER — Encounter: Payer: Self-pay | Admitting: Family Medicine

## 2016-07-15 VITALS — BP 129/75 | HR 76 | Temp 98.6°F | Resp 18 | Ht 67.0 in | Wt 284.0 lb

## 2016-07-15 DIAGNOSIS — Z794 Long term (current) use of insulin: Secondary | ICD-10-CM

## 2016-07-15 DIAGNOSIS — F17210 Nicotine dependence, cigarettes, uncomplicated: Secondary | ICD-10-CM

## 2016-07-15 DIAGNOSIS — E119 Type 2 diabetes mellitus without complications: Secondary | ICD-10-CM | POA: Insufficient documentation

## 2016-07-15 DIAGNOSIS — IMO0001 Reserved for inherently not codable concepts without codable children: Secondary | ICD-10-CM

## 2016-07-15 DIAGNOSIS — E1165 Type 2 diabetes mellitus with hyperglycemia: Secondary | ICD-10-CM

## 2016-07-15 DIAGNOSIS — E1365 Other specified diabetes mellitus with hyperglycemia: Secondary | ICD-10-CM

## 2016-07-15 DIAGNOSIS — F17200 Nicotine dependence, unspecified, uncomplicated: Secondary | ICD-10-CM

## 2016-07-15 LAB — BASIC METABOLIC PANEL
BUN: 11 mg/dL (ref 7–25)
CHLORIDE: 102 mmol/L (ref 98–110)
CO2: 22 mmol/L (ref 20–31)
Calcium: 10 mg/dL (ref 8.6–10.2)
Creat: 0.64 mg/dL (ref 0.50–1.10)
Glucose, Bld: 308 mg/dL — ABNORMAL HIGH (ref 65–99)
POTASSIUM: 4.2 mmol/L (ref 3.5–5.3)
Sodium: 136 mmol/L (ref 135–146)

## 2016-07-15 LAB — POCT URINALYSIS DIPSTICK
BILIRUBIN UA: NEGATIVE
Blood, UA: NEGATIVE
GLUCOSE UA: 500
KETONES UA: NEGATIVE
Leukocytes, UA: NEGATIVE
Nitrite, UA: NEGATIVE
Protein, UA: NEGATIVE
SPEC GRAV UA: 1.01
Urobilinogen, UA: 0.2
pH, UA: 6

## 2016-07-15 LAB — LIPID PANEL
CHOLESTEROL: 198 mg/dL (ref <200)
HDL: 58 mg/dL (ref >50)
LDL CALC: 119 mg/dL — AB (ref <100)
TRIGLYCERIDES: 105 mg/dL (ref <150)
Total CHOL/HDL Ratio: 3.4 Ratio (ref <5.0)
VLDL: 21 mg/dL (ref <30)

## 2016-07-15 LAB — POCT GLYCOSYLATED HEMOGLOBIN (HGB A1C): Hemoglobin A1C: 11.1

## 2016-07-15 LAB — GLUCOSE, POCT (MANUAL RESULT ENTRY): POC GLUCOSE: 433 mg/dL — AB (ref 70–99)

## 2016-07-15 MED ORDER — METFORMIN HCL 500 MG PO TABS: 1000.0000 mg | ORAL_TABLET | Freq: Two times a day (BID) | ORAL | 2 refills | Status: DC

## 2016-07-15 MED ORDER — INSULIN ASPART 100 UNIT/ML ~~LOC~~ SOLN
20.0000 [IU] | Freq: Once | SUBCUTANEOUS | Status: AC
  Administered 2016-07-15: 20 [IU] via SUBCUTANEOUS

## 2016-07-15 MED ORDER — INSULIN ASPART 100 UNIT/ML ~~LOC~~ SOLN
10.0000 [IU] | Freq: Every day | SUBCUTANEOUS | 11 refills | Status: DC
Start: 2016-07-15 — End: 2016-07-15

## 2016-07-15 MED ORDER — ACCU-CHEK SOFTCLIX LANCETS MISC: 12 refills | Status: DC

## 2016-07-15 MED ORDER — INSULIN ASPART 100 UNIT/ML ~~LOC~~ SOLN
10.0000 [IU] | Freq: Once | SUBCUTANEOUS | Status: AC
  Administered 2016-07-15: 10 [IU] via SUBCUTANEOUS

## 2016-07-15 MED ORDER — INSULIN ASPART 100 UNIT/ML ~~LOC~~ SOLN: 10.0000 [IU] | Freq: Every day | SUBCUTANEOUS | 11 refills | Status: DC

## 2016-07-15 MED ORDER — NICOTINE POLACRILEX 2 MG MT GUM: 2.0000 mg | CHEWING_GUM | OROMUCOSAL | 1 refills | Status: DC | PRN

## 2016-07-15 MED ORDER — GLUCOSE BLOOD VI STRP: ORAL_STRIP | 12 refills | Status: DC

## 2016-07-15 NOTE — BH Specialist Note (Signed)
Session Start time: 3:00 PM   End Time: 3:20 PM Total Time:  20 minutes Type of Service: Behavioral Health - Individual/Family Interpreter: No.   Interpreter Name & Language: N/A # [facility]   SUBJECTIVE: [name] [name] is a 26 y.o. female  Pt. was referred by FNP Jenelle MagesHairston for:  anxiety and depression. Pt. reports the following symptoms/concerns: sleeping for extended periods of time, low energy and motivation, withdrawn from others, decreased appetite, and racing thoughts Duration of problem:  Pt reports diagnosis of Bipolar and Depression three years ago Severity: moderately severe Previous treatment: Pt received therapy at [facility] for approximately two [facility]. She was prescribed Latuda; however, discontinued medication   OBJECTIVE: Mood: Anxious & Affect: Depressed Risk of harm to self or others: Pt has hx of suicidal ideations. Pt denies plan or intent to self-harm and/or harm others Assessments administered: PHQ-9; GAD-7  LIFE CONTEXT:  Family & Social: Pt resides alone with her three year old daughter. Pt's mother resides nearby and provides support  School/ Work: Pt works part-time at [employer] during third shift.  Self-Care: Pt does not exercise. She has difficulty sleeping and a decreased appetite. Pt reports smoking cigarettes (1 pack in 3 days) and marijuana (during weekend) Life changes: None reported What is important to pt/family (values): Family   GOALS ADDRESSED:  Decrease symptoms of anxiety Decrease symptoms of depression  INTERVENTIONS: Solution Focused, Strength-based and Supportive   ASSESSMENT:  Pt currently experiencing depression and anxiety. Pt reports sleeping for extended periods of time, low energy and motivation, withdrawn from others, decreased appetite, racing thoughts, and suicidal ideations. She has limited support.  Pt may benefit from therapy and medication management. LCSWA educated pt on the cycle of depression  and anxiety and inquired protective factors with pt. Pt reported no hx of self harm or suicide attempts in the past noting daughter is a protective factor. LCSWA discussed healthy coping skills to decrease symptoms. Pt identified coping strategies to utilize on a daily basis, in addition, to a safety plan. LCSWA provided pt with community resources for crisis intervention, therapy, and medication management.     PLAN: 1. F/U with behavioral health clinician: Pt was encouraged to contact LCSWA if symptoms worsen or fail to improve to [facility]. 2. Behavioral Health meds: None reported 3. Behavioral recommendations: LCSWA recommends that pt apply healthy coping skills discussed and re-initiate behavioral health services. Pt is encouraged to schedule follow up appointment with LCSWA 4. Referral: Brief Counseling/Psychotherapy, [facility], Problem-solving teaching/coping strategies, Psychoeducation and Supportive Counseling 5. From scale of 1-10, how likely are you to follow plan: 6/10   Bridgett LarssonJasmine D [name], MSW, [facility]CSWA  Clinical Social Worker 07/16/16 9:03 AM  Warmhandoff:   Warm Hand Off Completed.

## 2016-07-15 NOTE — Progress Notes (Signed)
 Subjective:  Patient ID: [name] [name], female    DOB: 07/10/1990  Age: 26 y.o. MRN: [id number]  CC: Diabetes  HPI Karla R Carley presents to establish care for diabetes. She reports being without her diabetic medications due to lack of insurance. Reports needing more diabetic glucose monitoring supplies. She denies any vision changes or wounds. Wears corrective lenses, last eye exam was more than a year ago. She does report polydipsia and polyuria.   Outpatient Medications Prior to Visit  Medication Sig Dispense Refill  . ACCU-CHEK SOFTCLIX LANCETS lancets Check sugars ACHS for E11.9 100 each 12  . Blood Glucose Monitoring Suppl (ACCU-CHEK AVIVA PLUS) w/Device KIT Check sugars ACHS for E11.9 1 kit 0  . glucose blood (ACCU-CHEK AVIVA) test strip Check sugars ACHS for E11.9 100 each 12  . insulin aspart (NOVOLOG) 100 UNIT/ML injection Inject 10 Units into the skin at bedtime.     . metFORMIN (GLUCOPHAGE) 500 MG tablet Take 2 tablets (1,000 mg total) by mouth 2 (two) times daily with a meal. IN 1 week switch to 1,000 mg twice daily 120 tablet 0  . metroNIDAZOLE (FLAGYL) 500 MG tablet Take 1 tablet (500 mg total) by mouth 2 (two) times daily. 14 tablet 0  . terconazole (TERAZOL 7) 0.4 % vaginal cream Place 1 applicator vaginally at bedtime. 45 g 0   No facility-administered medications prior to visit.     ROS Review of Systems  Eyes: Negative.   Respiratory: Negative.   Cardiovascular: Negative.   Gastrointestinal: Negative.   Endocrine: Positive for polydipsia and polyuria.  Genitourinary: Negative.    Objective:  There were no vitals taken for this visit.  BP/Weight 07/12/2016 05/29/2016 05/28/2016  Systolic BP 146 105 -  Diastolic BP 87 65 -  Wt. (Lbs) - - 280  BMI - - 43.85   Physical Exam  Constitutional: She is oriented to person, place, and time.  Eyes: Conjunctivae are normal. Pupils are equal, round, and reactive to light.  Wears corrective lenses.    Cardiovascular: Normal rate, regular rhythm, normal heart sounds and intact distal pulses.   Pulmonary/Chest: Effort normal and breath sounds normal.  Abdominal: Soft. Bowel sounds are normal.  Neurological: She is alert and oriented to person, place, and time.  Skin: Skin is warm and dry.  Monofilament exam is wnl.  Vitals reviewed.  Assessment & Plan:   Problem List Items Addressed This Visit      Endocrine   Uncontrolled diabetes mellitus (HCC) - Primary   Relevant Medications   insulin aspart (novoLOG) injection 20 Units   glucose blood (ACCU-CHEK AVIVA) test strip   ACCU-CHEK SOFTCLIX LANCETS lancets   metFORMIN (GLUCOPHAGE) 500 MG tablet   insulin aspart (NOVOLOG) 100 UNIT/ML injection   Other Relevant Orders   Basic Metabolic Panel   Microalbumin / creatinine urine ratio   Lipid panel   Glucose (CBG) (Completed)   HgB A1c (Completed)   Urinalysis Dipstick (Completed)   Basic Metabolic Panel   Microalbumin / creatinine urine ratio   Lipid panel      Meds ordered this encounter  Medications  . insulin aspart (novoLOG) injection 20 Units  . DISCONTD: metFORMIN (GLUCOPHAGE) 500 MG tablet    Sig: Take 2 tablets (1,000 mg total) by mouth 2 (two) times daily with a meal.    Dispense:  60 tablet    Refill:  2    Order Specific Question:   Supervising Provider    Answer:   JEGEDE, OLUGBEMIGA   E [[id number]]  . DISCONTD: insulin aspart (NOVOLOG) 100 UNIT/ML injection    Sig: Inject 10 Units into the skin at bedtime.    Dispense:  10 mL    Refill:  11    Order Specific Question:   Supervising Provider    Answer:   JEGEDE, OLUGBEMIGA E [[id number]]  . glucose blood (ACCU-CHEK AVIVA) test strip    Sig: Check sugars ACHS for E11.9    Dispense:  100 each    Refill:  12    Order Specific Question:   Supervising Provider    Answer:   JEGEDE, OLUGBEMIGA E [[id number]]  . ACCU-CHEK SOFTCLIX LANCETS lancets    Sig: Check sugars ACHS for E11.9    Dispense:  100 each    Refill:  12     Order Specific Question:   Supervising Provider    Answer:   JEGEDE, OLUGBEMIGA E [[id number]]  . metFORMIN (GLUCOPHAGE) 500 MG tablet    Sig: Take 2 tablets (1,000 mg total) by mouth 2 (two) times daily with a meal.    Dispense:  60 tablet    Refill:  2    Order Specific Question:   Supervising Provider    Answer:   JEGEDE, OLUGBEMIGA E [[id number]]  . insulin aspart (NOVOLOG) 100 UNIT/ML injection    Sig: Inject 10 Units into the skin at bedtime.    Dispense:  10 mL    Refill:  11    Order Specific Question:   Supervising Provider    Answer:   JEGEDE, OLUGBEMIGA E [[id number]]    Follow-up: Return in about 3 months (around 10/13/2016) for Diabetes.    R  FNP    

## 2016-07-15 NOTE — Progress Notes (Signed)
Patient is here for diabetes  Patient declined the flu shot today  Patient stated no pain today  Patient have not taken any meds    Patient already eaten during the day  Patient needs refill on insulin inject And metformin

## 2016-07-15 NOTE — Patient Instructions (Signed)
Type 2 Diabetes Mellitus, Diagnosis, Adult Type 2 diabetes (type 2 diabetes mellitus) is a long-term (chronic) disease. It may be caused by one or both of these problems:  Your body does not make enough of a hormone called insulin.  Your body does not react in a normal way to insulin that it makes. Insulin lets sugars (glucose) go into cells in the body. This gives you energy. If you have type 2 diabetes, sugars cannot get into cells. This causes high blood sugar (hyperglycemia). Your doctor will set treatment goals for you. Generally, you should have these blood sugar levels:  Before meals (preprandial): 80-130 mg/dL (4.4-7.2 mmol/L).  After meals (postprandial): below 180 mg/dL (10 mmol/L).  A1c (hemoglobin A1c) level: less than 7%. Follow these instructions at home: Questions to Ask Your Doctor  You may want to ask these questions:  Do I need to meet with a diabetes educator?  Where can I find a support group for people with diabetes?  What equipment will I need to care for myself at home?  What diabetes medicines do I need? When should I take them?  How often do I need to check my blood sugar?  What number can I call if I have questions?  When is my next doctor's visit? General instructions  Take over-the-counter and prescription medicines only as told by your doctor.  Keep all follow-up visits as told by your doctor. This is important. Contact a doctor if:  Your blood sugar is at or above 240 mg/dL (13.3 mmol/L) for 2 days in a row.  You have been sick or have had a fever for 2 days or more and you are not getting better.  You have any of these problems for more than 6 hours:  You cannot eat or drink.  You feel sick to your stomach (nauseous).  You throw up (vomit).  You have watery poop (diarrhea). Get help right away if:  Your blood sugar is lower than 54 mg/dL (3 mmol/L).  You get confused.  You have trouble:  Thinking clearly.  Breathing.  You  have moderate or large ketone levels in your pee (urine). This information is not intended to replace advice given to you by your health care provider. Make sure you discuss any questions you have with your health care provider. Document Released: 03/30/2008 Document Revised: 11/27/2015 Document Reviewed: 07/25/2015 Elsevier Interactive Patient Education  2017 [employer] Inc[employer]  

## 2016-07-16 LAB — MICROALBUMIN / CREATININE URINE RATIO
Creatinine, Urine: 23 mg/dL (ref 20–320)
MICROALB/CREAT RATIO: 96 ug/mg{creat} — AB (ref <30)
Microalb, Ur: 2.2 mg/dL

## 2016-07-16 MED FILL — metFORMIN HCL 500 MG TABS: 500 | 30 days supply | Qty: 120 | Fill #0

## 2016-07-16 MED FILL — !NOVOLOG 100UNITS/ML VIAL: 100/ML | 30 days supply | Qty: 10 | Fill #0

## 2016-07-19 ENCOUNTER — Telehealth: Payer: Self-pay

## 2016-07-19 ENCOUNTER — Other Ambulatory Visit: Payer: Self-pay | Admitting: Family Medicine

## 2016-07-19 DIAGNOSIS — R809 Proteinuria, unspecified: Secondary | ICD-10-CM

## 2016-07-19 MED ORDER — LISINOPRIL 2.5 MG PO TABS: 2.5000 mg | ORAL_TABLET | Freq: Every day | ORAL | 2 refills | Status: DC

## 2016-07-19 MED FILL — LISINOPRIL 2.5 MG TABLET: 2.5 | 30 days supply | Qty: 30 | Fill #0

## 2016-07-19 NOTE — Telephone Encounter (Signed)
Patient Verify DOB  Patient was aware and understood her microalbumin ratio was elevated and that her pcp prescribed her some medications for it.  Patient did not had any further questions.

## 2016-07-19 NOTE — Telephone Encounter (Signed)
-----   Message from Lizbeth BarkMandesia R Hairston, FNP sent at 07/19/2016  8:16 AM EST ----- -Microalbumin/creatinine ratio level was elevated. This tests for protein in your urine that could indicate early signs of kidney damage. I have prescribed you lisinopril. If you become pregnant or plan to become pregnant then discontinue this medication and notify the office. Repeat urine microalbumin test in 3 months.  -Start eating a diet low in saturated fat. Limit your intake of fried foods, red meats, and whole milk.Begin exercising at least 3-5 times per week for at least 30 minutes.

## 2016-07-26 ENCOUNTER — Telehealth: Payer: Self-pay | Admitting: Family Medicine

## 2016-07-26 ENCOUNTER — Other Ambulatory Visit: Payer: Self-pay | Admitting: Family Medicine

## 2016-07-26 NOTE — Telephone Encounter (Signed)
I will switch patient to [name] [name] instead. Please have patient to schedule appointment with Kennyth ArnoldStacy to discuss better DM control.

## 2016-07-26 NOTE — Telephone Encounter (Signed)
Lisinopril was not given for BP but for renal protective benefit. I have decided not to switch pt.to ARB to due potential for hypotension. Just ask pt.to discontinue taking Lisinopril.

## 2016-07-26 NOTE — Telephone Encounter (Signed)
Patient has walked in and states she is experiencing chest pains when taking Lisinopril. She has stopped taking medication and would like to speak to a nurse or provider.

## 2016-07-26 NOTE — Telephone Encounter (Signed)
[facility] with concerns of having chest pain when taking Lisinopril. She has stopped taking medication for 3 days and denies chest pain presently. When taking Lisinopril she experienced some tightening to mid chest . No h/o GI sx's or anxiety. She states she stopped medications x 3-4 days. Denies SOB, syncope, generalized swelling.  BP 110/70 manually.  Pt also states that her blood sugars are still elevated at home. Has reading in the 200's. She states she take Novalog insulin as prescribed.  Sindy Messingoger Gomez present during nurse visit. He directed the physical exam.  Please advise patient with new medication to take for BP and DM management.

## 2016-07-27 NOTE — Telephone Encounter (Signed)
Pt aware. Appointment with [name] CraverStacey Karl made for Wednesday July 28, 2016. Pt aware to bring CBG readings and medications to apt.

## 2016-07-28 ENCOUNTER — Ambulatory Visit: Payer: Self-pay | Attending: Family Medicine | Admitting: Pharmacist

## 2016-07-28 DIAGNOSIS — E119 Type 2 diabetes mellitus without complications: Secondary | ICD-10-CM | POA: Insufficient documentation

## 2016-07-28 DIAGNOSIS — E1165 Type 2 diabetes mellitus with hyperglycemia: Secondary | ICD-10-CM

## 2016-07-28 DIAGNOSIS — IMO0001 Reserved for inherently not codable concepts without codable children: Secondary | ICD-10-CM

## 2016-07-28 DIAGNOSIS — Z794 Long term (current) use of insulin: Secondary | ICD-10-CM | POA: Insufficient documentation

## 2016-07-28 MED ORDER — TRUE METRIX METER W/DEVICE KIT
PACK | 0 refills | Status: DC
Start: 2016-07-28 — End: 2019-09-18

## 2016-07-28 MED ORDER — INSULIN GLARGINE 100 UNIT/ML SOLOSTAR PEN: 15.0000 [IU] | PEN_INJECTOR | Freq: Every day | SUBCUTANEOUS | 2 refills | Status: DC

## 2016-07-28 MED ORDER — GLUCOSE BLOOD VI STRP: ORAL_STRIP | 12 refills | Status: DC

## 2016-07-28 MED ORDER — TRUEPLUS LANCETS 28G MISC: 12 refills | Status: DC

## 2016-07-28 MED FILL — TRUEplus LANCETS 28G MISC: 25 days supply | Qty: 100 | Fill #0

## 2016-07-28 MED FILL — LANTUS SOLOSTAR 100 UNITS/M: 100 | 20 days supply | Qty: 3 | Fill #0

## 2016-07-28 MED FILL — TRUE METRIX BLOOD GLUCOSE M: W/DEVICE | 1 days supply | Qty: 1 | Fill #0

## 2016-07-28 MED FILL — TRUE METRIX TEST STRIP: 25 days supply | Qty: 100 | Fill #0

## 2016-07-28 NOTE — Patient Instructions (Addendum)
Thanks for coming to see [name] [name]!  Stop Novolog  Start Lantus 15 units daily.  Come back in 2 weeks, sooner if you have any blood sugar readings less than 70   Carbohydrate Counting for Diabetes Mellitus, Adult Carbohydrate counting is a method for keeping track of how many carbohydrates you eat. Eating carbohydrates naturally increases the amount of sugar (glucose) in the blood. Counting how many carbohydrates you eat helps keep your blood glucose within normal limits, which helps you manage your diabetes (diabetes mellitus). It is important to know how many carbohydrates you can safely have in each meal. This is different for every person. A diet and nutrition specialist (registered dietitian) can help you make a meal plan and calculate how many carbohydrates you should have at each meal and snack. Carbohydrates are found in the following foods:  Grains, such as breads and cereals.  Dried beans and soy products.  Starchy vegetables, such as potatoes, peas, and corn.  Fruit and fruit juices.  Milk and yogurt.  Sweets and snack foods, such as cake, cookies, candy, chips, and soft drinks. How do I count carbohydrates? There are two ways to count carbohydrates in food. You can use either of the methods or a combination of both. Reading "Nutrition Facts" on packaged food  The "Nutrition Facts" list is included on the labels of almost all packaged foods and beverages in the [place] It includes:  The serving size.  Information about nutrients in each serving, including the grams (g) of carbohydrate per serving. To use the "Nutrition Facts":  Decide how many servings you will have.  Multiply the number of servings by the number of carbohydrates per serving.  The resulting number is the total amount of carbohydrates that you will be having. Learning standard serving sizes of other foods  When you eat foods containing carbohydrates that are not packaged or do not include "Nutrition Facts" on  the label, you need to measure the servings in order to count the amount of carbohydrates:  Measure the foods that you will eat with a food scale or measuring cup, if needed.  Decide how many standard-size servings you will eat.  Multiply the number of servings by 15. Most carbohydrate-rich foods have about 15 g of carbohydrates per serving.  For example, if you eat 8 oz (170 g) of strawberries, you will have eaten 2 servings and 30 g of carbohydrates (2 servings x 15 g = 30 g).  For foods that have more than one food mixed, such as soups and casseroles, you must count the carbohydrates in each food that is included. The following list contains standard serving sizes of common carbohydrate-rich foods. Each of these servings has about 15 g of carbohydrates:   hamburger bun or  English muffin.   oz (15 mL) syrup.   oz (14 g) jelly.  1 slice of bread.  1 six-inch tortilla.  3 oz (85 g) cooked rice or pasta.  4 oz (113 g) cooked dried beans.  4 oz (113 g) starchy vegetable, such as peas, corn, or potatoes.  4 oz (113 g) hot cereal.  4 oz (113 g) mashed potatoes or  of a large baked potato.  4 oz (113 g) canned or frozen fruit.  4 oz (120 mL) fruit juice.  4-6 crackers.  6 chicken nuggets.  6 oz (170 g) unsweetened dry cereal.  6 oz (170 g) plain fat-free yogurt or yogurt sweetened with artificial sweeteners.  8 oz (240 mL) milk.  8  oz (170 g) fresh fruit or one small piece of fruit.  24 oz (680 g) popped popcorn. Example of carbohydrate counting Sample meal  3 oz (85 g) chicken breast.  6 oz (170 g) brown rice.  4 oz (113 g) corn.  8 oz (240 mL) milk.  8 oz (170 g) strawberries with sugar-free whipped topping. Carbohydrate calculation 1. Identify the foods that contain carbohydrates:  Rice.  Corn.  Milk.  Strawberries. 2. Calculate how many servings you have of each food:  2 servings rice.  1 serving corn.  1 serving milk.  1 serving  strawberries. 3. Multiply each number of servings by 15 g:  2 servings rice x 15 g = 30 g.  1 serving corn x 15 g = 15 g.  1 serving milk x 15 g = 15 g.  1 serving strawberries x 15 g = 15 g. 4. Add together all of the amounts to find the total grams of carbohydrates eaten:  30 g + 15 g + 15 g + 15 g = 75 g of carbohydrates total. This information is not intended to replace advice given to you by your health care provider. Make sure you discuss any questions you have with your health care provider. Document Released: 06/21/2005 Document Revised: 01/09/2016 Document Reviewed: 12/03/2015 Elsevier Interactive Patient Education  2017 Elsevier Inc.   Hypoglycemia  Hypoglycemia is when the sugar (glucose) level in the blood is too low. Symptoms of low blood sugar may include:  Feeling:  Hungry.  Worried or nervous (anxious).  Sweaty and clammy.  Confused.  Dizzy.  Sleepy.  Sick to your stomach (nauseous).  Having:  A fast heartbeat.  A headache.  A change in your vision.  Jerky movements that you cannot control (seizure).  Nightmares.  Tingling or no feeling (numbness) around the mouth, lips, or tongue.  Having trouble with:  Talking.  Paying attention (concentrating).  Moving (coordination).  Sleeping.  Shaking.  Passing out (fainting).  Getting upset easily (irritability). Low blood sugar can happen to people who have diabetes and people who do not have diabetes. Low blood sugar can happen quickly, and it can be an emergency. Treating Low Blood Sugar  Low blood sugar is often treated by eating or drinking something sugary right away. If you can think clearly and swallow safely, follow the 15:15 rule:  Take 15 grams of a fast-acting carb (carbohydrate). Some fast-acting carbs are:  1 tube of glucose gel.  3 sugar tablets (glucose pills).  6-8 pieces of hard candy.  4 oz (120 mL) of fruit juice.  4 oz (120 mL) of regular (not diet)  soda.  Check your blood sugar 15 minutes after you take the carb.  If your blood sugar is still at or below 70 mg/dL (3.9 mmol/L), take 15 grams of a carb again.  If your blood sugar does not go above 70 mg/dL (3.9 mmol/L) after 3 tries, get help right away.  After your blood sugar goes back to normal, eat a meal or a snack within 1 hour. Treating Very Low Blood Sugar  If your blood sugar is at or below 54 mg/dL (3 mmol/L), you have very low blood sugar (severe hypoglycemia). This is an emergency. Do not wait to see if the symptoms will go away. Get medical help right away. Call your local emergency services (911 in the [place] Do not drive yourself to the hospital. If you have very low blood sugar and you cannot eat or drink,  you may need a glucagon shot (injection). A family member or friend should learn how to check your blood sugar and how to give you a glucagon shot. Ask your doctor if you need to have a glucagon shot kit at home. Follow these instructions at home: General instructions  Avoid any diets that cause you to not eat enough food. Talk with your doctor before you start any new diet.  Take over-the-counter and prescription medicines only as told by your doctor.  Limit alcohol to no more than 1 drink per day for nonpregnant women and 2 drinks per day for men. One drink equals 12 oz of beer, 5 oz of wine, or 1 oz of hard liquor.  Keep all follow-up visits as told by your doctor. This is important. If You Have Diabetes:   Make sure you know the symptoms of low blood sugar.  Always keep a source of sugar with you, such as:  Sugar.  Sugar tablets.  Glucose gel.  Fruit juice.  Regular soda (not diet soda).  Milk.  Hard candy.  Honey.  Take your medicines as told.  Follow your exercise and meal plan.  Eat on time. Do not skip meals.  Follow your sick day plan when you cannot eat or drink normally. Make this plan ahead of time with your doctor.  Check your  blood sugar as often as told by your doctor. Always check before and after exercise.  Share your diabetes care plan with:  Your work or school.  People you live with.  Check your pee (urine) for ketones:  When you are sick.  As told by your doctor.  Carry a card or wear jewelry that says you have diabetes. If You Have Low Blood Sugar From Other Causes:   Check your blood sugar as often as told by your doctor.  Follow instructions from your doctor about what you cannot eat or drink. Contact a doctor if:  You have trouble keeping your blood sugar in your target range.  You have low blood sugar often. Get help right away if:  You still have symptoms after you eat or drink something sugary.  Your blood sugar is at or below 54 mg/dL (3 mmol/L).  You have jerky movements that you cannot control.  You pass out. These symptoms may be an emergency. Do not wait to see if the symptoms will go away. Get medical help right away. Call your local emergency services (911 in the [place] Do not drive yourself to the hospital.  This information is not intended to replace advice given to you by your health care provider. Make sure you discuss any questions you have with your health care provider. Document Released: 09/15/2009 Document Revised: 11/27/2015 Document Reviewed: 07/25/2015 Elsevier Interactive Patient Education  2017 [employer].

## 2016-07-28 NOTE — Progress Notes (Signed)
S:    Patient arrives in good spirits  Presents for diabetes management.   Patient reports adherence with medications. Current diabetes medications include Novolog 10 units at bedtime.  Patient denies hypoglycemic events.  Patient reported dietary habits: chicken & veggies   Patient reports nocturia (1 time/night). Patient denies neuropathy.  Patient reports visual changes where her sight is blurry.   O:  . Lab Results  Component Value Date   HGBA1C 11.1 07/15/2016     Home fasting CBG: 200s  A/P: Diabetes diagnosed in 2014 currently uncontrolled.   Denies hypoglycemic events and is able to verbalize appropriate hypoglycemia management plan.  Reports adherence with medication. Control is suboptimal due to short acting insulin regimen.  Discontinue Novolog. Initiate insulin Lantus 15 units at bedtime.  Next A1C anticipated in April 2018.  Written patient instructions provided.  Follow up in Pharmacist Clinic Visit in 2 weeks. Total time in face to face counseling  15 minutes.  Patient seen with Raykwon Hobbs.

## 2016-07-28 NOTE — Addendum Note (Signed)
Addended by: Santa LighterHAMMER, Durwin Davisson K on: 07/28/2016 05:01 PM   Modules accepted: Orders

## 2016-08-01 ENCOUNTER — Encounter (HOSPITAL_COMMUNITY): Payer: Self-pay | Admitting: Emergency Medicine

## 2016-08-01 ENCOUNTER — Emergency Department (HOSPITAL_COMMUNITY)
Admission: EM | Admit: 2016-08-01 | Discharge: 2016-08-01 | Disposition: A | Discharge status: Home / Self Care | Payer: Self-pay | Attending: Emergency Medicine | Admitting: Emergency Medicine

## 2016-08-01 DIAGNOSIS — R109 Unspecified abdominal pain: Secondary | ICD-10-CM | POA: Insufficient documentation

## 2016-08-01 DIAGNOSIS — R51 Headache: Secondary | ICD-10-CM | POA: Insufficient documentation

## 2016-08-01 DIAGNOSIS — F17210 Nicotine dependence, cigarettes, uncomplicated: Secondary | ICD-10-CM | POA: Insufficient documentation

## 2016-08-01 DIAGNOSIS — R519 Headache, unspecified: Secondary | ICD-10-CM

## 2016-08-01 DIAGNOSIS — Z794 Long term (current) use of insulin: Secondary | ICD-10-CM | POA: Insufficient documentation

## 2016-08-01 DIAGNOSIS — E119 Type 2 diabetes mellitus without complications: Secondary | ICD-10-CM | POA: Insufficient documentation

## 2016-08-01 LAB — CBC
HEMATOCRIT: 33.1 % — AB (ref 36.0–46.0)
HEMOGLOBIN: 10.9 g/dL — AB (ref 12.0–15.0)
MCH: 30.5 pg (ref 26.0–34.0)
MCHC: 32.9 g/dL (ref 30.0–36.0)
MCV: 92.7 fL (ref 78.0–100.0)
PLATELETS: 211 10*3/uL (ref 150–400)
RBC: 3.57 MIL/uL — AB (ref 3.87–5.11)
RDW: 12.5 % (ref 11.5–15.5)
WBC: 6.2 10*3/uL (ref 4.0–10.5)

## 2016-08-01 LAB — URINALYSIS, ROUTINE W REFLEX MICROSCOPIC
BILIRUBIN URINE: NEGATIVE
Bacteria, UA: NONE SEEN
Glucose, UA: >=500 mg/dL — AB
Hgb urine dipstick: NEGATIVE
KETONES UR: 5 mg/dL — AB
Leukocytes, UA: NEGATIVE
NITRITE: NEGATIVE
PH: 5 (ref 5.0–8.0)
PROTEIN: 30 mg/dL — AB
Specific Gravity, Urine: 1.028 (ref 1.005–1.030)

## 2016-08-01 LAB — BASIC METABOLIC PANEL
ANION GAP: 9 (ref 5–15)
BUN: 7 mg/dL (ref 6–20)
CHLORIDE: 106 mmol/L (ref 101–111)
CO2: 25 mmol/L (ref 22–32)
Calcium: 9 mg/dL (ref 8.9–10.3)
Creatinine, Ser: 0.55 mg/dL (ref 0.44–1.00)
GFR calc Af Amer: >60 mL/min (ref >60)
GLUCOSE: 248 mg/dL — AB (ref 65–99)
POTASSIUM: 3.6 mmol/L (ref 3.5–5.1)
Sodium: 140 mmol/L (ref 135–145)

## 2016-08-01 LAB — HEPATIC FUNCTION PANEL
ALT: 11 U/L — ABNORMAL LOW (ref 14–54)
AST: 14 U/L — ABNORMAL LOW (ref 15–41)
Albumin: 3.4 g/dL — ABNORMAL LOW (ref 3.5–5.0)
Alkaline Phosphatase: 60 U/L (ref 38–126)
Total Bilirubin: 0.6 mg/dL (ref 0.3–1.2)
Total Protein: 7.1 g/dL (ref 6.5–8.1)

## 2016-08-01 LAB — LIPASE, BLOOD: Lipase: 21 U/L (ref 11–51)

## 2016-08-01 LAB — POC URINE PREG, ED: Preg Test, Ur: NEGATIVE

## 2016-08-01 MED ORDER — IBUPROFEN 400 MG PO TABS
400.0000 mg | ORAL_TABLET | Freq: Once | ORAL | Status: AC
  Administered 2016-08-01: 400 mg via ORAL
  Filled 2016-08-01: qty 1

## 2016-08-01 NOTE — ED Triage Notes (Signed)
Pt reports right flank pain, dark urine and headache for the past few days. Reports hx of kidney damage, denies any other urinary symptoms or fevers at home.

## 2016-08-01 NOTE — Discharge Instructions (Signed)
Please drink plenty of fluids Take Ibuprofen/Tylenol for pain

## 2016-08-01 NOTE — ED Notes (Signed)
Call Lab to get Lipase and Hepatic  Added on.

## 2016-08-01 NOTE — ED Notes (Signed)
Pt stable, ambulatory, states understanding of discharge instructions 

## 2016-08-01 NOTE — ED Provider Notes (Signed)
[facility] DEPT Provider Note   CSN: [id number] Arrival date & time: 08/01/16  1305    History   Chief Complaint Chief Complaint  Patient presents with  . Flank Pain  . Headache    HPI [name] [name] is a 26 y.o. female who presents with flank pain and HA. PMH significant for insulin dependent DM and PCOS. She states that she has had a headache, R flank pain, and dark urine for the past 3 days. Headache is constant, across the front of her head. She has not tried anything to make it better. She has also had R flank pain for the past 3 days and had an acute onset of non-bloody, non-bilious N/V this morning which woke her up from sleep. She denies fever, chills, chest pain, SOB, abdominal pain, diarrhea/constiaption, vaginal discharge or bleeding, dysuria or urinary frequency.  HPI  Past Medical History:  Diagnosis Date  . 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 Diabetes mellitus   . 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 Gonorrhea   . Hirsutism   . PCOS (polycystic ovarian syndrome)     Patient Active Problem List   Diagnosis Date Noted  . Uncontrolled diabetes mellitus ([place]) 06/04/2013  . Cushings syndrome ([place]) 01/08/2013  . PCOS (polycystic ovarian syndrome) 01/08/2013    Past Surgical History:  Procedure Laterality Date  . ABSCESS DRAINAGE    . TOOTH EXTRACTION      OB History    Gravida Para Term Preterm AB Living   _0 0 1 1   SAB TAB Ectopic Multiple Live Births   1 0 0 0 1       Home Medications    Prior to Admission medications   Medication Sig Start Date End Date Taking? Authorizing Provider  Blood Glucose Monitoring Suppl (TRUE METRIX METER) w/Device KIT Use as directed 07/28/16   Tresa Garter, MD  glucose blood (TRUE METRIX BLOOD GLUCOSE TEST) test strip Use as instructed 07/28/16   Tresa Garter, MD  insulin aspart (NOVOLOG) 100 UNIT/ML injection Inject 10 Units into the skin at bedtime.     Historical Provider, MD  Insulin Glargine  (LANTUS SOLOSTAR) 100 UNIT/ML Solostar Pen Inject 15 Units into the skin daily at 10 pm. 07/28/16   Tresa Garter, MD  metFORMIN (GLUCOPHAGE) 500 MG tablet Take 2 tablets (1,000 mg total) by mouth 2 (two) times daily with a meal. 07/15/16   Alfonse Spruce, FNP  nicotine polacrilex (NICORETTE) 2 MG gum Take 1 each (2 mg total) by mouth every 2 (two) hours as needed for smoking cessation. Patient not taking: Reported on 07/28/2016 07/15/16   Alfonse Spruce, FNP  TRUEPLUS LANCETS 28G MISC Use as directed 07/28/16   Tresa Garter, MD    Family History Family History  Problem Relation Age of Onset  . Epilepsy Father   . Cancer Father     lung cancer  . COPD Maternal Grandmother   . Hypertension Maternal Grandmother   . Heart disease Maternal Grandmother   . Cancer Paternal Grandfather     Social History Social History  Substance Use Topics  . Smoking status: Current Every Day Smoker    Packs/day: 0.50    Types: Cigarettes  . Smokeless tobacco: Never Used  . Alcohol use 4.8 - 6.0 oz/week    3 - 4 Cans of beer, 5 - 6 Shots of liquor per week     Comment: socially     Allergies  Lisinopril   Review of Systems Review of Systems  Constitutional: Negative for chills and fever.  Respiratory: Negative for shortness of breath.   Cardiovascular: Negative for chest pain.  Gastrointestinal: Positive for nausea (resolved) and vomiting (resolved). Negative for abdominal pain and constipation.  Endocrine: Negative for polyuria.  Genitourinary: Positive for flank pain. Negative for decreased urine volume, difficulty urinating, dysuria, pelvic pain, vaginal bleeding and vaginal discharge.       Dark urine     Physical Exam Updated Vital Signs BP 122/74   Pulse 74   Temp 97.9 F (36.6 C)   Resp 16   Ht _0  (1.702 m)   Wt 128.8 kg   LMP 07/13/2016 (Approximate)   SpO2 100%   BMI 44.48 kg/m   Physical Exam  Constitutional: She is oriented to person, place,  and time. She appears well-developed and well-nourished. No distress.  Obese, NAD  HENT:  Head: Normocephalic and atraumatic.  Eyes: Conjunctivae are normal. Pupils are equal, round, and reactive to light. Right eye exhibits no discharge. Left eye exhibits no discharge. No scleral icterus.  Neck: Normal range of motion.  Cardiovascular: Normal rate.   Pulmonary/Chest: Effort normal. No respiratory distress.  Abdominal: She exhibits no distension.  R CVA tenderness  Neurological: She is alert and oriented to person, place, and time.  Skin: Skin is warm and dry.  Psychiatric: Her behavior is normal. Her affect is blunt.  Nursing note and vitals reviewed.    ED Treatments / Results  Labs (all labs ordered are listed, but only abnormal results are displayed) Labs Reviewed  CBC - Abnormal; Notable for the following:       Result Value   RBC 3.57 (*)    Hemoglobin 10.9 (*)    HCT 33.1 (*)    All other components within normal limits  BASIC METABOLIC PANEL - Abnormal; Notable for the following:    Glucose, Bld 248 (*)    All other components within normal limits  HEPATIC FUNCTION PANEL - Abnormal; Notable for the following:    Albumin 3.4 (*)    AST 14 (*)    ALT 11 (*)    Bilirubin, Direct <0.1 (*)    All other components within normal limits  URINALYSIS, ROUTINE W REFLEX MICROSCOPIC - Abnormal; Notable for the following:    APPearance HAZY (*)    Glucose, UA >=500 (*)    Ketones, ur 5 (*)    Protein, ur 30 (*)    Squamous Epithelial / LPF 6-30 (*)    All other components within normal limits  URINE CULTURE  LIPASE, BLOOD  POC URINE PREG, ED    EKG  EKG Interpretation None       Radiology No results found.  Procedures Procedures (including critical care time)  Medications Ordered in ED Medications  ibuprofen (ADVIL,MOTRIN) tablet 400 mg (400 mg Oral Given 08/01/16 1526)     Initial Impression / Assessment and Plan / ED Course  I have reviewed the triage  vital signs and the nursing notes.  Pertinent labs & imaging results that were available during my care of the patient were reviewed by me and considered in my medical decision making (see chart for details).  26 year old female with headache, flank pain, dark urine. Patient is afebrile, not tachycardic or tachypneic, normotensive, and not hypoxic. Labs remarkable for hyperglycemia and mild anemia. UA remarkable for >500 glucose, 30 protein, 6-30 WBC but appears contaminated. Culture sent. Discussed results with patient.  Headache treated in ED. Advised better control of blood sugar and plenty of fluids. Follow up with PCP.  Final Clinical Impressions(s) / ED Diagnoses   Final diagnoses:  Right flank pain  Nonintractable headache, unspecified chronicity pattern, unspecified headache type    New Prescriptions New Prescriptions   No medications on file     Recardo Evangelist, PA-C 08/01/16 [place], MD 08/01/16 2240

## 2016-08-02 ENCOUNTER — Encounter: Payer: Self-pay | Admitting: Family Medicine

## 2016-08-02 ENCOUNTER — Telehealth: Payer: Self-pay | Admitting: Family Medicine

## 2016-08-02 LAB — URINE CULTURE

## 2016-08-02 NOTE — Telephone Encounter (Signed)
[name] from [employer] called to speak with PCP in regarding an verbal authorization/ call in authorization for an order (novalag solution). Please call [phone].  Thank you.

## 2016-08-02 NOTE — Telephone Encounter (Signed)
Pt. Called sting that she needs a letter from her PCP regarding her medical history. Pt. States she need the letter for her job.  Please f/u

## 2016-08-03 ENCOUNTER — Encounter: Payer: Self-pay | Admitting: *Deleted

## 2016-08-03 NOTE — Telephone Encounter (Signed)
[name] [name] from [employer] called to speak with PCP in regarding an verbal authorization/ call in authorization for an order (novalag solution). Please call [phone].  Please advice, Thankyou.

## 2016-08-03 NOTE — Telephone Encounter (Signed)
This encounter was created in error - please disregard.

## 2016-08-03 NOTE — Telephone Encounter (Signed)
Spoke with patient in office today regarding her request for work excuse. Note will be given to patient with the appointment date and time she was seen in the clinic.

## 2016-08-03 NOTE — Telephone Encounter (Signed)
Called the [phone] and was told that company was [employer] and that they do not do anything through Cover My Meds. I was able to find the number to Cover My Meds and spoke with Amy to follow up.

## 2016-08-11 ENCOUNTER — Ambulatory Visit: Payer: Self-pay | Admitting: Pharmacist

## 2016-08-11 ENCOUNTER — Ambulatory Visit: Payer: Self-pay | Attending: Family Medicine | Admitting: Pharmacist

## 2016-08-11 DIAGNOSIS — Z794 Long term (current) use of insulin: Secondary | ICD-10-CM | POA: Insufficient documentation

## 2016-08-11 DIAGNOSIS — IMO0001 Reserved for inherently not codable concepts without codable children: Secondary | ICD-10-CM

## 2016-08-11 DIAGNOSIS — E119 Type 2 diabetes mellitus without complications: Secondary | ICD-10-CM | POA: Insufficient documentation

## 2016-08-11 DIAGNOSIS — E1165 Type 2 diabetes mellitus with hyperglycemia: Secondary | ICD-10-CM

## 2016-08-11 NOTE — Progress Notes (Signed)
S:    Patient arrives in good spirits  Presents for diabetes management. Referred by Arrie SenateMandesia Hairston, NP, on 07/15/16. Last seen by Arrie SenateMandesia Hairston, NP on 07/15/16  Patient reports adherence with medications. Current diabetes medications include Lantus 15 units daily and metformin 1000 mg BID. She has stopped the Novolog.   Patient denies hypoglycemic events.  Patient reported dietary habits: chicken & veggies   Patient reports nocturia (1 time/night). Patient denies neuropathy.  Patient reports visual changes where her sight is blurry.   O:  . Lab Results  Component Value Date   HGBA1C 11.1 07/15/2016     Home fasting CBG: 100s-130s  A/P: Diabetes diagnosed in 2014 currently uncontrolled based on A1c of 11.1 but under much improved control based on home CBGs.  Denies hypoglycemic events and is able to verbalize appropriate hypoglycemia management plan.  Reports adherence with medication.   Increase Lantus to 18 units. Continue metformin. Provided education on hypoglycemia. She is doing very well and I think this increase in her Lantus should get her to goal. Patient to start checking post-prandials (2 hours after she eats) to assess if she needs any mealtime coverage. [facility] but can come back to see me or anyone in clinic sooner if she has hypoglycemia or multiple readings >200. Patient verbalizes understanding.   Next A1C anticipated in April 2018.  Written patient instructions provided.  Follow up in Pharmacist Clinic Visit in 2 weeks. Total time in face to face counseling  15 minutes.  Patient seen with Amy Dorszynski.

## 2016-08-11 NOTE — Patient Instructions (Signed)
Thanks for coming to see [place].  Increase Lantus to 18 units daily  Continue metformin  Get some blood sugars 2 hours after you eat  YOU ARE DOING AWESOME!!!!!!!!!   Hypoglycemia  Hypoglycemia is when the sugar (glucose) level in the blood is too low. Symptoms of low blood sugar may include:  Feeling:  Hungry.  Worried or nervous (anxious).  Sweaty and clammy.  Confused.  Dizzy.  Sleepy.  Sick to your stomach (nauseous).  Having:  A fast heartbeat.  A headache.  A change in your vision.  Jerky movements that you cannot control (seizure).  Nightmares.  Tingling or no feeling (numbness) around the mouth, lips, or tongue.  Having trouble with:  Talking.  Paying attention (concentrating).  Moving (coordination).  Sleeping.  Shaking.  Passing out (fainting).  Getting upset easily (irritability). Low blood sugar can happen to people who have diabetes and people who do not have diabetes. Low blood sugar can happen quickly, and it can be an emergency. Treating Low Blood Sugar  Low blood sugar is often treated by eating or drinking something sugary right away. If you can think clearly and swallow safely, follow the 15:15 rule:  Take 15 grams of a fast-acting carb (carbohydrate). Some fast-acting carbs are:  1 tube of glucose gel.  3 sugar tablets (glucose pills).  6-8 pieces of hard candy.  4 oz (120 mL) of fruit juice.  4 oz (120 mL) of regular (not diet) soda.  Check your blood sugar 15 minutes after you take the carb.  If your blood sugar is still at or below 70 mg/dL (3.9 mmol/L), take 15 grams of a carb again.  If your blood sugar does not go above 70 mg/dL (3.9 mmol/L) after 3 tries, get help right away.  After your blood sugar goes back to normal, eat a meal or a snack within 1 hour. Treating Very Low Blood Sugar  If your blood sugar is at or below 54 mg/dL (3 mmol/L), you have very low blood sugar (severe hypoglycemia). This is an  emergency. Do not wait to see if the symptoms will go away. Get medical help right away. Call your local emergency services (911 in the [place] Do not drive yourself to the hospital. If you have very low blood sugar and you cannot eat or drink, you may need a glucagon shot (injection). A family member or friend should learn how to check your blood sugar and how to give you a glucagon shot. Ask your doctor if you need to have a glucagon shot kit at home. Follow these instructions at home: General instructions  Avoid any diets that cause you to not eat enough food. Talk with your doctor before you start any new diet.  Take over-the-counter and prescription medicines only as told by your doctor.  Limit alcohol to no more than 1 drink per day for nonpregnant women and 2 drinks per day for men. One drink equals 12 oz of beer, 5 oz of wine, or 1 oz of hard liquor.  Keep all follow-up visits as told by your doctor. This is important. If You Have Diabetes:   Make sure you know the symptoms of low blood sugar.  Always keep a source of sugar with you, such as:  Sugar.  Sugar tablets.  Glucose gel.  Fruit juice.  Regular soda (not diet soda).  Milk.  Hard candy.  Honey.  Take your medicines as told.  Follow your exercise and meal plan.  Eat on  time. Do not skip meals.  Follow your sick day plan when you cannot eat or drink normally. Make this plan ahead of time with your doctor.  Check your blood sugar as often as told by your doctor. Always check before and after exercise.  Share your diabetes care plan with:  Your work or school.  People you live with.  Check your pee (urine) for ketones:  When you are sick.  As told by your doctor.  Carry a card or wear jewelry that says you have diabetes. If You Have Low Blood Sugar From Other Causes:   Check your blood sugar as often as told by your doctor.  Follow instructions from your doctor about what you cannot eat or  drink. Contact a doctor if:  You have trouble keeping your blood sugar in your target range.  You have low blood sugar often. Get help right away if:  You still have symptoms after you eat or drink something sugary.  Your blood sugar is at or below 54 mg/dL (3 mmol/L).  You have jerky movements that you cannot control.  You pass out. These symptoms may be an emergency. Do not wait to see if the symptoms will go away. Get medical help right away. Call your local emergency services (911 in the [place] Do not drive yourself to the hospital.  This information is not intended to replace advice given to you by your health care provider. Make sure you discuss any questions you have with your health care provider. Document Released: 09/15/2009 Document Revised: 11/27/2015 Document Reviewed: 07/25/2015 Elsevier Interactive Patient Education  2017 [employer].

## 2016-10-13 ENCOUNTER — Ambulatory Visit: Payer: Self-pay | Admitting: Family Medicine

## 2016-11-05 ENCOUNTER — Encounter (HOSPITAL_COMMUNITY): Payer: Self-pay | Admitting: Emergency Medicine

## 2016-11-05 ENCOUNTER — Ambulatory Visit (HOSPITAL_COMMUNITY)
Admission: EM | Admit: 2016-11-05 | Discharge: 2016-11-05 | Disposition: A | Discharge status: Home / Self Care | Payer: Medicaid Other | Attending: Internal Medicine | Admitting: Internal Medicine

## 2016-11-05 DIAGNOSIS — L03011 Cellulitis of right finger: Secondary | ICD-10-CM

## 2016-11-05 MED ORDER — SULFAMETHOXAZOLE-TRIMETHOPRIM 800-160 MG PO TABS: 1.0000 | ORAL_TABLET | Freq: Two times a day (BID) | ORAL | 0 refills | Status: AC

## 2016-11-05 MED ORDER — CEPHALEXIN 500 MG PO CAPS: 500.0000 mg | ORAL_CAPSULE | Freq: Two times a day (BID) | ORAL | 0 refills | Status: AC

## 2016-11-05 NOTE — ED Provider Notes (Signed)
[facility]    CSN: [id number] Arrival date & time: 11/05/16  1930     History   Chief Complaint Chief Complaint  Patient presents with  . Hand Pain    HPI [name] [name] is a 26 y.o. female. Couple days ago noticed a "whitehead" on her right fifth finger, proximal segment, squeezed it a little bit. Now finger is red/swollen/painful. Some difficulty with full flexion of the finger at the MCP. No fever, no malaise. Works at [employer].    HPI  Past Medical History:  Diagnosis Date  . 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 Diabetes mellitus   . 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 Gonorrhea   . Hirsutism   . PCOS (polycystic ovarian syndrome)     Patient Active Problem List   Diagnosis Date Noted  . Uncontrolled diabetes mellitus ([place]) 06/04/2013  . Cushings syndrome ([place]) 01/08/2013  . PCOS (polycystic ovarian syndrome) 01/08/2013    Past Surgical History:  Procedure Laterality Date  . ABSCESS DRAINAGE    . TOOTH EXTRACTION      OB History    Gravida Para Term Preterm AB Living   _0 0 1 1   SAB TAB Ectopic Multiple Live Births   1 0 0 0 1       Home Medications    Prior to Admission medications   Medication Sig Start Date End Date Taking? Authorizing Provider  Blood Glucose Monitoring Suppl (TRUE METRIX METER) w/Device KIT Use as directed 07/28/16   Tresa Garter, MD  cephALEXin (KEFLEX) 500 MG capsule Take 1 capsule (500 mg total) by mouth 2 (two) times daily. 11/05/16 11/12/16  Sherlene Shams, MD  glucose blood (TRUE METRIX BLOOD GLUCOSE TEST) test strip Use as instructed 07/28/16   Angelica Chessman E, MD  insulin aspart (NOVOLOG) 100 UNIT/ML injection Inject 10 Units into the skin at bedtime.     [provider]  Insulin Glargine (LANTUS SOLOSTAR) 100 UNIT/ML Solostar Pen Inject 15 Units into the skin daily at 10 pm. 07/28/16   Tresa Garter, MD  metFORMIN (GLUCOPHAGE) 500 MG tablet Take 2 tablets (1,000 mg total) by mouth 2  (two) times daily with a meal. 07/15/16   Hairston, Maylon Peppers, FNP  sulfamethoxazole-trimethoprim (BACTRIM DS,SEPTRA DS) 800-160 MG tablet Take 1 tablet by mouth 2 (two) times daily. 11/05/16 11/12/16  Sherlene Shams, MD  TRUEPLUS LANCETS 28G MISC Use as directed 07/28/16   Tresa Garter, MD    Family History Family History  Problem Relation Age of Onset  . Epilepsy Father   . Cancer Father     lung cancer  . COPD Maternal Grandmother   . Hypertension Maternal Grandmother   . Heart disease Maternal Grandmother   . Cancer Paternal Grandfather     Social History Social History  Substance Use Topics  . Smoking status: Current Every Day Smoker    Packs/day: 0.50    Types: Cigarettes  . Smokeless tobacco: Never Used  . Alcohol use 4.8 - 6.0 oz/week    3 - 4 Cans of beer, 5 - 6 Shots of liquor per week     Comment: socially     Allergies   Lisinopril   Review of Systems Review of Systems  All other systems reviewed and are negative.    Physical Exam Triage Vital Signs ED Triage Vitals  Enc Vitals Group     BP 11/05/16 1941 133/75     Pulse Rate 11/05/16  1941 83     Resp 11/05/16 1941 18     Temp 11/05/16 1941 98.6 F (37 C)     Temp Source 11/05/16 1941 Oral     SpO2 11/05/16 1941 98 %     Weight --      Height --      Pain Score 11/05/16 1940 7     Pain Loc --    Updated Vital Signs BP 133/75 (BP Location: Left Arm)   Pulse 83   Temp 98.6 F (37 C) (Oral)   Resp 18   SpO2 98%   Physical Exam  Constitutional: She is oriented to person, place, and time. No distress.  HENT:  Head: Atraumatic.  Eyes:  Conjugate gaze observed, no eye redness/discharge  Neck: Neck supple.  Cardiovascular: Normal rate.   Pulmonary/Chest: No respiratory distress.  Abdominal: She exhibits no distension.  Musculoskeletal: Normal range of motion.  Neurological: She is alert and oriented to person, place, and time.  Skin: Skin is warm and dry.  Right fifth finger is  slightly swollen diffusely, warm, with erythema over the proximal segment, and a tiny pustule. There does not appear to be any focal fluctuance. The finger is tender. Range of motion at the PIP and MCP are restricted by pain.  Nursing note and vitals reviewed.    UC Treatments / Results   Procedures Procedures (including critical care time) None today  Final Clinical Impressions(s) / UC Diagnoses   Final diagnoses:  Cellulitis of finger of right hand   Wash gently with soap/water 1-2 times daily, apply ointment and bandage.  Recheck if any increasing redness/swelling/pain/drainage or new fever>100.5, or if not starting to improve in a few days. A warm compress for 5-10 minutes several times daily may allow the infected area to drain.  Prescriptions sent to the [facility] on [address] for cephalexin and trimethoprim/sulfa (antibiotics).    New Prescriptions Discharge Medication List as of 11/05/2016  8:33 PM    START taking these medications   Details  cephALEXin (KEFLEX) 500 MG capsule Take 1 capsule (500 mg total) by mouth 2 (two) times daily., Starting Fri 11/05/2016, Until Fri 11/12/2016, Normal    sulfamethoxazole-trimethoprim (BACTRIM DS,SEPTRA DS) 800-160 MG tablet Take 1 tablet by mouth 2 (two) times daily., Starting Fri 11/05/2016, Until Fri 11/12/2016, Normal         Sherlene Shams, MD 11/07/16 1745

## 2016-11-05 NOTE — ED Triage Notes (Signed)
[facility] a [facility] r[facility] pain and swelling x 2 days. The patient denied any known injury but did report a "bump" on her hand 2 days ago.

## 2016-11-05 NOTE — Discharge Instructions (Addendum)
Wash gently with soap/water 1-2 times daily, apply ointment and bandage.  Recheck if any increasing redness/swelling/pain/drainage or new fever>100.5, or if not starting to improve in a few days. A warm compress for 5-10 minutes several times daily may allow the infected area to drain.  [facility] [address] [facility]in and trimethoprim/sulfa (antibiotics).

## 2016-11-29 ENCOUNTER — Emergency Department (HOSPITAL_COMMUNITY)
Admission: EM | Admit: 2016-11-29 | Discharge: 2016-11-29 | Discharge status: Against Medical Advice | Payer: Medicaid Other

## 2016-11-29 NOTE — ED Notes (Signed)
Pt does not answer when called from lobby to triage. 

## 2016-11-29 NOTE — ED Notes (Signed)
Pt does not answer when called from lobby to triage.

## 2016-12-20 ENCOUNTER — Emergency Department (HOSPITAL_COMMUNITY)
Admission: EM | Admit: 2016-12-20 | Discharge: 2016-12-20 | Discharge status: Against Medical Advice | Payer: Medicaid Other

## 2016-12-20 ENCOUNTER — Encounter (HOSPITAL_COMMUNITY): Payer: Self-pay

## 2016-12-20 NOTE — ED Notes (Signed)
Name called again, no answer.

## 2017-02-01 ENCOUNTER — Encounter (HOSPITAL_COMMUNITY): Payer: Self-pay | Admitting: Emergency Medicine

## 2017-02-01 ENCOUNTER — Emergency Department (HOSPITAL_COMMUNITY)
Admission: EM | Admit: 2017-02-01 | Discharge: 2017-02-01 | Disposition: A | Discharge status: Home / Self Care | Payer: Medicaid Other | Attending: Emergency Medicine | Admitting: Emergency Medicine

## 2017-02-01 ENCOUNTER — Emergency Department (HOSPITAL_BASED_OUTPATIENT_CLINIC_OR_DEPARTMENT_OTHER)
Admit: 2017-02-01 | Discharge: 2017-02-01 | Disposition: A | Discharge status: Home / Self Care | Payer: Self-pay | Attending: Emergency Medicine | Admitting: Emergency Medicine

## 2017-02-01 DIAGNOSIS — M79609 Pain in unspecified limb: Secondary | ICD-10-CM

## 2017-02-01 DIAGNOSIS — Z794 Long term (current) use of insulin: Secondary | ICD-10-CM | POA: Insufficient documentation

## 2017-02-01 DIAGNOSIS — M79605 Pain in left leg: Secondary | ICD-10-CM | POA: Insufficient documentation

## 2017-02-01 DIAGNOSIS — Z79899 Other long term (current) drug therapy: Secondary | ICD-10-CM | POA: Insufficient documentation

## 2017-02-01 DIAGNOSIS — L739 Follicular disorder, unspecified: Secondary | ICD-10-CM

## 2017-02-01 DIAGNOSIS — F17210 Nicotine dependence, cigarettes, uncomplicated: Secondary | ICD-10-CM | POA: Insufficient documentation

## 2017-02-01 DIAGNOSIS — E119 Type 2 diabetes mellitus without complications: Secondary | ICD-10-CM | POA: Insufficient documentation

## 2017-02-01 MED ORDER — CEPHALEXIN 500 MG PO CAPS: 500.0000 mg | ORAL_CAPSULE | Freq: Three times a day (TID) | ORAL | 0 refills | Status: DC

## 2017-02-01 MED ORDER — NAPROXEN 500 MG PO TABS: 500.0000 mg | ORAL_TABLET | Freq: Two times a day (BID) | ORAL | 0 refills | Status: DC | PRN

## 2017-02-01 NOTE — ED Provider Notes (Signed)
[facility] DEPT Provider Note   CSN: [id number] Arrival date & time: 02/01/17  1354   By signing my name below, I, Eunice Blase, attest that this documentation has been prepared under the direction and in the presence of [facility], PA-C. Electronically signed, Eunice Blase, ED Scribe. 02/01/17. 3:56 PM.   History   Chief Complaint Chief Complaint  Patient presents with  . Abscess   The history is provided by the patient and medical records. No language interpreter was used.    [name] [name] is a 26 y.o. female with h/o DM presenting to the Emergency Department concerning a raised, painful bump with a central, white head on the anterior L shin that has grown since she noticed it last night. Associated redness surrounding. She notes 2 weeks of preceding pain of the entire left lower extremity from knee to ankle. Pt describes 5/10, constant aches worse with movement and palpation. No h/o MRSA or blood clots. No fevers, drainage. No oral hormone use, no recent travel, no cough or shortness of breath. No other complaints at this time.   Past Medical History:  Diagnosis Date  . 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 Diabetes mellitus   . 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 Gonorrhea   . Hirsutism   . PCOS (polycystic ovarian syndrome)     Patient Active Problem List   Diagnosis Date Noted  . Uncontrolled diabetes mellitus ([place]) 06/04/2013  . 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 PCOS (polycystic ovarian syndrome) 01/08/2013    Past Surgical History:  Procedure Laterality Date  . ABSCESS DRAINAGE    . TOOTH EXTRACTION      OB History    Gravida Para Term Preterm AB Living   2 1 1  0 1 1   SAB TAB Ectopic Multiple Live Births   1 0 0 0 1       Home Medications    Prior to Admission medications   Medication Sig Start Date End Date Taking? Authorizing Provider  Blood Glucose Monitoring Suppl (TRUE METRIX METER) w/Device KIT Use as directed 07/28/16   Tresa Garter, MD  cephALEXin (KEFLEX) 500 MG capsule Take 1 capsule (500 mg total) by mouth 3 (three) times daily. 02/01/17   Ward, Ozella Almond, PA-C  glucose blood (TRUE METRIX BLOOD GLUCOSE TEST) test strip Use as instructed 07/28/16   Angelica Chessman E, MD  insulin aspart (NOVOLOG) 100 UNIT/ML injection Inject 10 Units into the skin at bedtime.     [provider]  Insulin Glargine (LANTUS SOLOSTAR) 100 UNIT/ML Solostar Pen Inject 15 Units into the skin daily at 10 pm. 07/28/16   Tresa Garter, MD  metFORMIN (GLUCOPHAGE) 500 MG tablet Take 2 tablets (1,000 mg total) by mouth 2 (two) times daily with a meal. 07/15/16   Hairston, Maylon Peppers, FNP  naproxen (NAPROSYN) 500 MG tablet Take 1 tablet (500 mg total) by mouth 2 (two) times daily as needed. 02/01/17   Ward, Ozella Almond, PA-C  TRUEPLUS LANCETS 28G MISC Use as directed 07/28/16   Tresa Garter, MD    Family History Family History  Problem Relation Age of Onset  . Epilepsy Father   . Cancer Father        lung cancer  . COPD Maternal Grandmother   . Hypertension Maternal Grandmother   . Heart disease Maternal Grandmother   . Cancer Paternal Grandfather     Social History Social History  Substance Use Topics  . Smoking status: Current Every  Day Smoker    Packs/day: 0.50    Types: Cigarettes  . Smokeless tobacco: Never Used  . Alcohol use 4.8 - 6.0 oz/week    3 - 4 Cans of beer, 5 - 6 Shots of liquor per week     Comment: socially     Allergies   Lisinopril   Review of Systems Review of Systems  Constitutional: Negative for chills, diaphoresis and fever.  Respiratory: Negative for shortness of breath.   Cardiovascular: Negative for leg swelling.  Gastrointestinal: Negative for nausea and vomiting.  Musculoskeletal: Positive for myalgias. Negative for gait problem and joint swelling.  Skin: Negative for color change and wound.  Allergic/Immunologic: Positive for immunocompromised state (h/o DM).    Neurological: Negative for weakness and numbness.  Hematological: Does not bruise/bleed easily.     Physical Exam Updated Vital Signs BP (!) 124/91 (BP Location: Left Arm)   Pulse 81   Temp 98.4 F (36.9 C) (Oral)   Resp 18   Ht 5' 7"  (1.702 m)   Wt 280 lb (127 kg)   SpO2 100%   BMI 43.85 kg/m   Physical Exam  Constitutional: She appears well-developed and well-nourished. No distress.  HENT:  Head: Normocephalic and atraumatic.  Neck: Neck supple.  Cardiovascular: Normal rate, regular rhythm and normal heart sounds.   No murmur heard. Pulmonary/Chest: Effort normal and breath sounds normal. No respiratory distress. She has no wheezes. She has no rales.  Musculoskeletal: Normal range of motion.  Calf tenderness. Full ROM. 5/5 muscle strength. 1 cm area of fluctuance with surrounding erythema which is tender to the touch as well.  Neurological: She is alert.  Skin: Skin is warm and dry.  Nursing note and vitals reviewed.    ED Treatments / Results  DIAGNOSTIC STUDIES: Oxygen Saturation is 100% on RA, NL by my interpretation.    COORDINATION OF CARE: 3:48 PM-Discussed next steps with pt. Pt verbalized understanding and is agreeable with the plan. Will order [place], prepare for I&D and Rx medications.   Labs (all labs ordered are listed, but only abnormal results are displayed) Labs Reviewed - No data to display  EKG  EKG Interpretation None       Radiology No results found.  Procedures Procedures (including critical care time)  INCISION AND DRAINAGE Performed by: Ozella Almond Ward Consent: Verbal consent obtained. Risks and benefits: risks, benefits and alternatives were discussed Time out performed prior to procedure Type: abscess Body area: Left lower leg Anesthesia: none Incision was made with a scalpel. Complexity: simple Blunt dissection to break up loculations Drainage: purulent Drainage amount: small Packing material: none Patient tolerance:  Patient tolerated the procedure well with no immediate complications.  Medications Ordered in ED Medications - No data to display   Initial Impression / Assessment and Plan / ED Course  I have reviewed the triage vital signs and the nursing notes.  Pertinent labs & imaging results that were available during my care of the patient were reviewed by me and considered in my medical decision making (see chart for details).    [name] [name] is a 26 y.o. female who presents to ED for left lower leg pain x 2 weeks. Small area of fluctuance developed last night. Doubt the area of infection is cause of diffuse leg pain with calf tenderness x 2 weeks, therefore ultrasound  was obtained to rule out DVT. U/S negative. Abscess drained and will start on Keflex. PCP follow up if leg pain persist. Symptomatic home  care and return precautions discussed and all questions answered.    Final Clinical Impressions(s) / ED Diagnoses   Final diagnoses:  Folliculitis  Left leg pain    New Prescriptions Discharge Medication List as of 02/01/2017  4:55 PM    START taking these medications   Details  cephALEXin (KEFLEX) 500 MG capsule Take 1 capsule (500 mg total) by mouth 3 (three) times daily., Starting Tue 02/01/2017, Print    naproxen (NAPROSYN) 500 MG tablet Take 1 tablet (500 mg total) by mouth 2 (two) times daily as needed., Starting Tue 02/01/2017, Print       I personally performed the services described in this documentation, which was scribed in my presence. The recorded information has been reviewed and is accurate.    Ward, Ozella Almond, PA-C 02/01/17 1943    Gareth Morgan, MD 02/04/17 1125

## 2017-02-01 NOTE — Discharge Instructions (Signed)
It was my pleasure taking care of you today!   Please take all of your antibiotics until finished!  Naproxen as needed for pain.   Follow up with your primary care provider if leg pain persists.   Return to ER for new or worsening symptoms, any additional concerns.

## 2017-02-01 NOTE — ED Triage Notes (Signed)
Pt comes in with complaints of left leg pain with associated abscess noted to left shin.  Pt states it is causing pain down the rest of her leg.

## 2017-02-01 NOTE — Progress Notes (Signed)
*  PRELIMINARY RESULTS* Vascular Ultrasound Left lower extremity venous duplex has been completed.  Preliminary findings: No evidence of deep vein thrombosis in the visualized veins of the left lower extremity.  Negative for baker's cyst.   Small pocket of fluid seen anterior left calf, area of palpable bump.  Preliminary results given to [facility] Ward @ 16:30   [name] FischerCharlotte C Lisette [name] 02/01/2017, 4:34 PM

## 2017-03-11 ENCOUNTER — Encounter: Payer: Self-pay | Admitting: Family Medicine

## 2017-03-11 ENCOUNTER — Ambulatory Visit: Payer: Self-pay | Attending: Family Medicine | Admitting: Family Medicine

## 2017-03-11 VITALS — BP 118/82 | HR 88 | Temp 98.6°F | Resp 18 | Ht 67.0 in | Wt 276.4 lb

## 2017-03-11 DIAGNOSIS — M545 Low back pain, unspecified: Secondary | ICD-10-CM

## 2017-03-11 DIAGNOSIS — E1365 Other specified diabetes mellitus with hyperglycemia: Secondary | ICD-10-CM

## 2017-03-11 DIAGNOSIS — E1165 Type 2 diabetes mellitus with hyperglycemia: Secondary | ICD-10-CM | POA: Insufficient documentation

## 2017-03-11 DIAGNOSIS — Z794 Long term (current) use of insulin: Secondary | ICD-10-CM | POA: Insufficient documentation

## 2017-03-11 DIAGNOSIS — Z79899 Other long term (current) drug therapy: Secondary | ICD-10-CM | POA: Insufficient documentation

## 2017-03-11 DIAGNOSIS — IMO0001 Reserved for inherently not codable concepts without codable children: Secondary | ICD-10-CM

## 2017-03-11 LAB — POCT URINALYSIS DIPSTICK
Bilirubin, UA: NEGATIVE
Glucose, UA: >=1000
Leukocytes, UA: NEGATIVE
Nitrite, UA: NEGATIVE
Protein, UA: 100
Spec Grav, UA: 1.025 (ref 1.010–1.025)
UROBILINOGEN UA: 1 U/dL
pH, UA: 5.5 (ref 5.0–8.0)

## 2017-03-11 LAB — GLUCOSE, POCT (MANUAL RESULT ENTRY)
POC GLUCOSE: 310 mg/dL — AB (ref 70–99)
POC GLUCOSE: 320 mg/dL — AB (ref 70–99)

## 2017-03-11 LAB — POCT GLYCOSYLATED HEMOGLOBIN (HGB A1C): Hemoglobin A1C: 9.3

## 2017-03-11 MED ORDER — METFORMIN HCL 500 MG PO TABS: 1000.0000 mg | ORAL_TABLET | Freq: Two times a day (BID) | ORAL | 2 refills | Status: DC

## 2017-03-11 MED ORDER — GLIPIZIDE 5 MG PO TABS: 5.0000 mg | ORAL_TABLET | Freq: Every day | ORAL | 2 refills | Status: DC

## 2017-03-11 MED ORDER — INSULIN PEN NEEDLE 32G X 4 MM MISC: 1.0000 | Freq: Once | 11 refills | Status: DC

## 2017-03-11 MED ORDER — INSULIN PEN NEEDLE 32G X 4 MM MISC: 1.0000 | Freq: Once | 11 refills | Status: AC

## 2017-03-11 MED ORDER — TRUEPLUS LANCETS 28G MISC: 12 refills | Status: DC

## 2017-03-11 MED ORDER — INSULIN GLARGINE 100 UNIT/ML SOLOSTAR PEN: 18.0000 [IU] | PEN_INJECTOR | Freq: Every day | SUBCUTANEOUS | 2 refills | Status: DC

## 2017-03-11 MED ORDER — GLUCOSE BLOOD VI STRP: ORAL_STRIP | 12 refills | Status: DC

## 2017-03-11 MED ORDER — IBUPROFEN 600 MG PO TABS: 600.0000 mg | ORAL_TABLET | Freq: Three times a day (TID) | ORAL | 0 refills | Status: DC | PRN

## 2017-03-11 MED ORDER — INSULIN ASPART 100 UNIT/ML ~~LOC~~ SOLN
10.0000 [IU] | Freq: Once | SUBCUTANEOUS | Status: AC
  Administered 2017-03-11: 10 [IU] via SUBCUTANEOUS

## 2017-03-11 NOTE — Progress Notes (Signed)
Patient is here for DM   Patient been without med for a few months now

## 2017-03-11 NOTE — Patient Instructions (Addendum)
Bring blood glucometer or blood glucose log  to next office visit .  Type 2 Diabetes Mellitus, Self Care, Adult When you have type 2 diabetes (type 2 diabetes mellitus), you must keep your blood sugar (glucose) under control. You can do this with:  Nutrition.  Exercise.  Lifestyle changes.  Medicines or insulin, if needed.  Support from your doctors and others.  How do I manage my blood sugar?  Check your blood sugar level every day, as often as told.  Call your doctor if your blood sugar is above your goal numbers for 2 tests in a row.  Have your A1c (hemoglobin A1c) level checked at least two times a year. Have it checked more often if your doctor tells you to. Your doctor will set treatment goals for you. Generally, you should have these blood sugar levels:  Before meals (preprandial): 80-130 mg/dL (4.4-7.2 mmol/L).  After meals (postprandial): lower than 180 mg/dL (10 mmol/L).  A1c level: less than 7%.  What do I need to know about high blood sugar? High blood sugar is called hyperglycemia. Know the signs of high blood sugar. Signs may include:  Feeling: ? Thirsty. ? Hungry. ? Very tired.  Needing to pee (urinate) more than usual.  Blurry vision.  What do I need to know about low blood sugar? Low blood sugar is called hypoglycemia. This is when blood sugar is at or below 70 mg/dL (3.9 mmol/L). Symptoms may include:  Feeling: ? Hungry. ? Worried or nervous (anxious). ? Sweaty and clammy. ? Confused. ? Dizzy. ? Sleepy. ? Sick to your stomach (nauseous).  Having: ? A fast heartbeat (palpitations). ? A headache. ? A change in your vision. ? Jerky movements that you cannot control (seizure). ? Nightmares. ? Tingling or no feeling (numbness) around the mouth, lips, or tongue.  Having trouble with: ? Talking. ? Paying attention (concentrating). ? Moving (coordination). ? Sleeping.  Shaking.  Passing out (fainting).  Getting upset easily  (irritability).  Treating low blood sugar  To treat low blood sugar, eat or drink something sugary right away. If you can think clearly and swallow safely, follow the 15:15 rule:  Take 15 grams of a fast-acting carb (carbohydrate). Some fast-acting carbs are: ? 1 tube of glucose gel. ? 3 sugar tablets (glucose pills). ? 6-8 pieces of hard candy. ? 4 oz (120 mL) of fruit juice. ? 4 oz (120 mL) regular (not diet) soda.  Check your blood sugar 15 minutes after you take the carb.  If your blood sugar is still at or below 70 mg/dL (3.9 mmol/L), take 15 grams of a carb again.  If your blood sugar does not go above 70 mg/dL (3.9 mmol/L) after 3 tries, get help right away.  After your blood sugar goes back to normal, eat a meal or a snack within 1 hour.  Treating very low blood sugar If your blood sugar is at or below 54 mg/dL (3 mmol/L), you have very low blood sugar (severe hypoglycemia). This is an emergency. Do not wait to see if the symptoms will go away. Get medical help right away. Call your local emergency services (911 in the [place] Do not drive yourself to the hospital. If you have very low blood sugar and you cannot eat or drink, you may need a glucagon shot (injection). A family member or friend should learn how to check your blood sugar and how to give you a glucagon shot. Ask your doctor if you need to have a  glucagon shot kit at home. What else is important to manage my diabetes? Medicine Follow these instructions about insulin and diabetes medicines:  Take them as told by your doctor.  Adjust them as told by your doctor.  Do not run out of them.  Having diabetes can raise your risk for other long-term conditions. These include heart or kidney disease. Your doctor may prescribe medicines to help prevent problems from diabetes. Food   Make healthy food choices. These include: ? Chicken, fish, egg whites, and beans. ? Oats, whole wheat, bulgur, brown rice, quinoa, and  millet. ? Fresh fruits and vegetables. ? Low-fat dairy products. ? Nuts, avocado, olive oil, and canola oil.  Make a food plan with a specialist (dietitian).  Follow instructions from your doctor about what you cannot eat or drink.  Drink enough fluid to keep your pee (urine) clear or pale yellow.  Eat healthy snacks between healthy meals.  Keep track of carbs that you eat. Read food labels. Learn food serving sizes.  Follow your sick day plan when you cannot eat or drink normally. Make this plan with your doctor so it is ready to use. Activity  Exercise at least 3 times a week.  Do not go more than 2 days without exercising.  Talk with your doctor before you start a new exercise. Your doctor may need to adjust your insulin, medicines, or food. Lifestyle   Do not use any tobacco products. These include cigarettes, chewing tobacco, and e-cigarettes.If you need help quitting, ask your doctor.  Ask your doctor how much alcohol is safe for you.  Learn to deal with stress. If you need help with this, ask your doctor. Body care  Stay up to date with your shots (immunizations).  Have your eyes and feet checked by a doctor as often as told.  Check your skin and feet every day. Check for cuts, bruises, redness, blisters, or sores.  Brush your teeth and gums two times a day.  Floss at least one time a day.  Go to the dentist least one time every 6 months.  Stay at a healthy weight. General instructions   Take over-the-counter and prescription medicines only as told by your doctor.  Share your diabetes care plan with: ? Your work or school. ? People you live with.  Check your pee (urine) for ketones: ? When you are sick. ? As told by your doctor.  Carry a card or wear jewelry that says that you have diabetes.  Ask your doctor: ? Do I need to meet with a diabetes educator? ? Where can I find a support group for people with diabetes?  Keep all follow-up visits as  told by your doctor. This is important. Where to find more information: To learn more about diabetes, visit:  American Diabetes Association: www.diabetes.org  American Association of Diabetes Educators: www.diabeteseducator.org/patient-resources  This information is not intended to replace advice given to you by your health care provider. Make sure you discuss any questions you have with your health care provider. Document Released: 10/13/2015 Document Revised: 11/27/2015 Document Reviewed: 07/25/2015 Elsevier Interactive Patient Education  [employer].

## 2017-03-11 NOTE — Progress Notes (Deleted)
   Subjective:  Patient ID: [name] [name], female    DOB: 03-27-1991  Age: 26 y.o. MRN: [id number]  CC: No chief complaint on file.   HPI [name] [name] presents for   Right sided back pain  CBG 200-230  18 units  metfromin   Refer   Diabetic  Gabapentin   Comes goes     Outpatient Medications Prior to Visit  Medication Sig Dispense Refill  . Blood Glucose Monitoring Suppl (TRUE METRIX METER) w/Device KIT Use as directed 1 kit 0  . cephALEXin (KEFLEX) 500 MG capsule Take 1 capsule (500 mg total) by mouth 3 (three) times daily. 21 capsule 0  . glucose blood (TRUE METRIX BLOOD GLUCOSE TEST) test strip Use as instructed 100 each 12  . insulin aspart (NOVOLOG) 100 UNIT/ML injection Inject 10 Units into the skin at bedtime.     . Insulin Glargine (LANTUS SOLOSTAR) 100 UNIT/ML Solostar Pen Inject 15 Units into the skin daily at 10 pm. 5 pen 2  . metFORMIN (GLUCOPHAGE) 500 MG tablet Take 2 tablets (1,000 mg total) by mouth 2 (two) times daily with a meal. 60 tablet 2  . naproxen (NAPROSYN) 500 MG tablet Take 1 tablet (500 mg total) by mouth 2 (two) times daily as needed. 30 tablet 0  . TRUEPLUS LANCETS 28G MISC Use as directed 100 each 12   No facility-administered medications prior to visit.     ROS Review of Systems  Review of Systems - {ros master:[id number]}    Objective:  BP 118/82 (BP Location: Left Arm, Patient Position: Sitting, Cuff Size: Normal)   Pulse 88   Temp 98.6 F (37 C) (Oral)   Resp 18   Ht _0  (1.702 m)   Wt 276 lb 6.4 oz (125.4 kg)   SpO2 96%   BMI 43.29 kg/m   BP/Weight 03/11/2017 3/37/4451 10/08/477  Systolic BP 987 215 872  Diastolic BP 82 91 75  Wt. (Lbs) 276.4 280 -  BMI 43.29 43.85 -     Physical Exam   Assessment & Plan:   Problem List Items Addressed This Visit      Endocrine   Uncontrolled diabetes mellitus ([name] [name]) - Primary   Relevant Medications   insulin aspart (novoLOG) injection 10 Units   Other Relevant Orders   Glucose (CBG) (Completed)   HgB A1c   Urinalysis Dipstick      Meds ordered this encounter  Medications  . insulin aspart (novoLOG) injection 10 Units    Follow-up: No Follow-up on file.   Alfonse Spruce FNP

## 2017-03-12 LAB — CMP AND LIVER
ALBUMIN: 4.2 g/dL (ref 3.5–5.5)
ALT: 6 IU/L (ref 0–32)
AST: 7 IU/L (ref 0–40)
Alkaline Phosphatase: 82 IU/L (ref 39–117)
BILIRUBIN TOTAL: 0.3 mg/dL (ref 0.0–1.2)
BUN: 7 mg/dL (ref 6–20)
Bilirubin, Direct: 0.12 mg/dL (ref 0.00–0.40)
CHLORIDE: 100 mmol/L (ref 96–106)
CO2: 21 mmol/L (ref 20–29)
Calcium: 9.3 mg/dL (ref 8.7–10.2)
Creatinine, Ser: 0.68 mg/dL (ref 0.57–1.00)
GFR, EST AFRICAN AMERICAN: 140 mL/min/{1.73_m2} (ref >59)
GFR, EST NON AFRICAN AMERICAN: 121 mL/min/{1.73_m2} (ref >59)
Glucose: 322 mg/dL — ABNORMAL HIGH (ref 65–99)
POTASSIUM: 4.2 mmol/L (ref 3.5–5.2)
SODIUM: 140 mmol/L (ref 134–144)
TOTAL PROTEIN: 7.3 g/dL (ref 6.0–8.5)

## 2017-03-12 LAB — LIPID PANEL
Chol/HDL Ratio: 3.4 ratio (ref 0.0–4.4)
Cholesterol, Total: 161 mg/dL (ref 100–199)
HDL: 47 mg/dL (ref >39)
LDL Calculated: 94 mg/dL (ref 0–99)
TRIGLYCERIDES: 101 mg/dL (ref 0–149)
VLDL Cholesterol Cal: 20 mg/dL (ref 5–40)

## 2017-03-13 NOTE — Progress Notes (Signed)
Subjective:  Patient ID: [name] [name], female    DOB: [birth year]  Age: 26 y.o. MRN: [id number]  CC: Diabetes  HPI [name] [name] presents to establish care for diabetes. She reports being without her diabetic medications for two months. She denies finances or transportation as barriers. She denies any foot ulcerations, nausea, polydipsia, polyuria, vomitting and weight loss. She does report intermittent pin and needles sensations to bilateral feet for last several months. Evaluation to date has been included: fasting blood sugar, fasting lipid panel and hemoglobin A1C.  Home sugars: patient does not check sugars. Treatment to date: no recent interventions.    Outpatient Medications Prior to Visit  Medication Sig Dispense Refill  . Blood Glucose Monitoring Suppl (TRUE METRIX METER) w/Device KIT Use as directed 1 kit 0  . cephALEXin (KEFLEX) 500 MG capsule Take 1 capsule (500 mg total) by mouth 3 (three) times daily. 21 capsule 0  . glucose blood (TRUE METRIX BLOOD GLUCOSE TEST) test strip Use as instructed 100 each 12  . insulin aspart (NOVOLOG) 100 UNIT/ML injection Inject 10 Units into the skin at bedtime.     . Insulin Glargine (LANTUS SOLOSTAR) 100 UNIT/ML Solostar Pen Inject 15 Units into the skin daily at 10 pm. 5 pen 2  . metFORMIN (GLUCOPHAGE) 500 MG tablet Take 2 tablets (1,000 mg total) by mouth 2 (two) times daily with a meal. 60 tablet 2  . naproxen (NAPROSYN) 500 MG tablet Take 1 tablet (500 mg total) by mouth 2 (two) times daily as needed. 30 tablet 0  . TRUEPLUS LANCETS 28G MISC Use as directed 100 each 12   No facility-administered medications prior to visit.     ROS Review of Systems  Eyes: Negative.   Respiratory: Negative.   Cardiovascular: Negative.   Gastrointestinal: Negative.   Genitourinary: Negative.   Neurological:       Paresthesias   Objective:  BP 118/82 (BP Location: Left Arm, Patient Position: Sitting, Cuff Size: Normal)   Pulse 88   Temp 98.6  F (37 C) (Oral)   Resp 18   Ht 5' 7" (1.702 m)   Wt 276 lb 6.4 oz (125.4 kg)   SpO2 96%   BMI 43.29 kg/m   BP/Weight 03/11/2017 07/19/7260 0/09/5595  Systolic BP 416 384 536  Diastolic BP 82 91 75  Wt. (Lbs) 276.4 280 -  BMI 43.29 43.85 -   Physical Exam  Constitutional: She is oriented to person, place, and time. She appears well-developed and well-nourished.  Eyes: Pupils are equal, round, and reactive to light. Conjunctivae are normal.  Wears corrective lenses.  Neck: Normal range of motion. Neck supple.  Cardiovascular: Normal rate, regular rhythm, normal heart sounds and intact distal pulses.   Pulmonary/Chest: Effort normal and breath sounds normal.  Abdominal: Soft. Bowel sounds are normal. There is no tenderness.  Neurological: She is alert and oriented to person, place, and time.  Skin: Skin is warm and dry.  Vitals reviewed.  Assessment & Plan:   Problem List Items Addressed This Visit      Endocrine   Uncontrolled diabetes mellitus ([place]) - Primary   Glipizide and Lantus insulin increased.    Follow up with clinical pharmacist in 2 weeks    Follow up with PCP in 3 months .   Relevant Medications   insulin aspart (novoLOG) injection 10 Units (Completed)   glipiZIDE (GLUCOTROL) 5 MG tablet   glucose blood (TRUE METRIX BLOOD GLUCOSE TEST) test strip   Insulin Glargine (  LANTUS SOLOSTAR) 100 UNIT/ML Solostar Pen   metFORMIN (GLUCOPHAGE) 500 MG tablet   TRUEPLUS LANCETS 28G MISC   Other Relevant Orders   Glucose (CBG) (Completed)   HgB A1c (Completed)   Urinalysis Dipstick (Completed)   Lipid Panel (Completed)   Glucose (CBG) (Completed)   CMP and Liver (Completed)    Other Visit Diagnoses    Acute right-sided low back pain without sciatica       Relevant Medications   ibuprofen (ADVIL,MOTRIN) 600 MG tablet      Meds ordered this encounter  Medications  . insulin aspart (novoLOG) injection 10 Units  . DISCONTD: metFORMIN (GLUCOPHAGE) 500 MG tablet     Sig: Take 2 tablets (1,000 mg total) by mouth 2 (two) times daily with a meal.    Dispense:  120 tablet    Refill:  2    Order Specific Question:   Supervising Provider    Answer:   Tresa Garter [id number]  . DISCONTD: Insulin Glargine (LANTUS SOLOSTAR) 100 UNIT/ML Solostar Pen    Sig: Inject 18 Units into the skin daily at 10 pm.    Dispense:  5 pen    Refill:  2    Order Specific Question:   Supervising Provider    Answer:   Tresa Garter [id number]  . DISCONTD: TRUEPLUS LANCETS 28G MISC    Sig: Use as directed    Dispense:  100 each    Refill:  12    Order Specific Question:   Supervising Provider    Answer:   Tresa Garter [[id number]]  . DISCONTD: glucose blood (TRUE METRIX BLOOD GLUCOSE TEST) test strip    Sig: Use as instructed    Dispense:  100 each    Refill:  12    Order Specific Question:   Supervising Provider    Answer:   Tresa Garter [[id number]]  . DISCONTD: glipiZIDE (GLUCOTROL) 5 MG tablet    Sig: Take 1 tablet (5 mg total) by mouth daily before breakfast.    Dispense:  30 tablet    Refill:  2    Order Specific Question:   Supervising Provider    Answer:   Tresa Garter [id number]  . DISCONTD: Insulin Pen Needle 32G X 4 MM MISC    Sig: 1 kit by Does not apply route once.    Dispense:  100 each    Refill:  11    Order Specific Question:   Supervising Provider    Answer:   Tresa Garter [id number]  . DISCONTD: ibuprofen (ADVIL,MOTRIN) 600 MG tablet    Sig: Take 1 tablet (600 mg total) by mouth every 8 (eight) hours as needed for moderate pain or cramping (Take with food.).    Dispense:  30 tablet    Refill:  0    Order Specific Question:   Supervising Provider    Answer:   Tresa Garter [id number]  . glipiZIDE (GLUCOTROL) 5 MG tablet    Sig: Take 1 tablet (5 mg total) by mouth daily before breakfast.    Dispense:  30 tablet    Refill:  2  . glucose blood (TRUE METRIX BLOOD GLUCOSE TEST) test strip    Sig: Use as  instructed    Dispense:  100 each    Refill:  12  . ibuprofen (ADVIL,MOTRIN) 600 MG tablet    Sig: Take 1 tablet (600 mg total) by mouth every 8 (eight) hours as needed for moderate pain or cramping (Take  with food.).    Dispense:  30 tablet    Refill:  0  . Insulin Glargine (LANTUS SOLOSTAR) 100 UNIT/ML Solostar Pen    Sig: Inject 18 Units into the skin daily at 10 pm.    Dispense:  5 pen    Refill:  2  . Insulin Pen Needle 32G X 4 MM MISC    Sig: 1 kit by Does not apply route once.    Dispense:  100 each    Refill:  11  . metFORMIN (GLUCOPHAGE) 500 MG tablet    Sig: Take 2 tablets (1,000 mg total) by mouth 2 (two) times daily with a meal.    Dispense:  120 tablet    Refill:  2  . TRUEPLUS LANCETS 28G MISC    Sig: Use as directed    Dispense:  100 each    Refill:  12    Follow-up: Return in about 2 weeks (around 03/25/2017) for DM with Stacy.   Alfonse Spruce FNP

## 2017-03-15 ENCOUNTER — Telehealth: Payer: Self-pay

## 2017-03-15 NOTE — Telephone Encounter (Signed)
CMA call regarding lab results  Patient did not answer but left a detailed message per DPR if have any questions just to call back  

## 2017-03-15 NOTE — Telephone Encounter (Signed)
-----   Message from Lizbeth BarkMandesia R Hairston, [place] sent at 03/14/2017  6:22 PM EDT ----- Kidney function normal Liver function normal Cholesterol levels are normal. Start eating a diet lower in saturated fat. Limit your intake of fried foods, red meats, and whole milk. Increase physical activity.

## 2017-03-24 ENCOUNTER — Ambulatory Visit: Payer: Self-pay | Admitting: Pharmacist

## 2017-03-24 NOTE — Progress Notes (Deleted)
    S:     No chief complaint on file.   Patient arrives ***.  Presents for diabetes evaluation, education, and management at the request of Arrie Senate, NP. Patient was referred on 03/11/17.  Patient was last seen by Primary Care Provider on 03/11/17.   Patient reports Diabetes was diagnosed in ***.   Family/Social History:   Patient {Actions; denies-reports:120008} adherence with medications. Prior to her last visit with PCP earlier this month, she was off of her medications x 2 months. Current diabetes medications include: metformin 1000 mg BID, Lantus 18 units daily, and glipizide 5 mg daily.   Patient {Actions; denies-reports:120008} hypoglycemic events.  Patient reported dietary habits: Eats *** meals/day Breakfast:*** Lunch:*** Dinner:*** Snacks:*** Drinks:***  Patient reported exercise habits:    Patient {Actions; denies-reports:120008} nocturia.  Patient {Actions; denies-reports:120008} neuropathy. Patient {Actions; denies-reports:120008} visual changes. Patient {Actions; denies-reports:120008} self foot exams.   Marlowe Kays:  Physical Exam   ROS   Lab Results  Component Value Date   HGBA1C 9.3 03/11/2017   There were no vitals filed for this visit.  Home fasting CBG: ***  2 hour post-prandial/random CBG: ***.  10 year ASCVD risk: ***.  A/P: Diabetes longstanding currently UNcontrolled based on A1c of 9.3. Patient {Actions; denies-reports:120008} hypoglycemic events and is able to verbalize appropriate hypoglycemia management plan. Patient {Actions; denies-reports:120008} adherence with medication. Control is suboptimal due to ***.  Continue metformin 1000 mg BID.  Next A1C anticipated December 2018.    Written patient instructions provided.  Total time in face to face counseling *** minutes.   Follow up in Pharmacist Clinic Visit ***.   Patient seen with Vickey Huger, PharmD Candidate

## 2017-04-05 ENCOUNTER — Encounter (HOSPITAL_COMMUNITY): Payer: Self-pay | Admitting: Emergency Medicine

## 2017-04-05 ENCOUNTER — Emergency Department (HOSPITAL_COMMUNITY)
Admission: EM | Admit: 2017-04-05 | Discharge: 2017-04-05 | Disposition: A | Discharge status: Home / Self Care | Payer: Self-pay | Attending: Emergency Medicine | Admitting: Emergency Medicine

## 2017-04-05 DIAGNOSIS — E119 Type 2 diabetes mellitus without complications: Secondary | ICD-10-CM | POA: Insufficient documentation

## 2017-04-05 DIAGNOSIS — Z794 Long term (current) use of insulin: Secondary | ICD-10-CM | POA: Insufficient documentation

## 2017-04-05 DIAGNOSIS — L299 Pruritus, unspecified: Secondary | ICD-10-CM | POA: Insufficient documentation

## 2017-04-05 DIAGNOSIS — N898 Other specified noninflammatory disorders of vagina: Secondary | ICD-10-CM | POA: Insufficient documentation

## 2017-04-05 DIAGNOSIS — F17210 Nicotine dependence, cigarettes, uncomplicated: Secondary | ICD-10-CM | POA: Insufficient documentation

## 2017-04-05 LAB — URINALYSIS, ROUTINE W REFLEX MICROSCOPIC
BACTERIA UA: NONE SEEN
Bilirubin Urine: NEGATIVE
Glucose, UA: >=500 mg/dL — AB
HGB URINE DIPSTICK: NEGATIVE
KETONES UR: NEGATIVE mg/dL
Leukocytes, UA: NEGATIVE
NITRITE: NEGATIVE
PROTEIN: 30 mg/dL — AB
Specific Gravity, Urine: 1.024 (ref 1.005–1.030)
pH: 7 (ref 5.0–8.0)

## 2017-04-05 LAB — WET PREP, GENITAL
Clue Cells Wet Prep HPF POC: NONE SEEN
Sperm: NONE SEEN
Trich, Wet Prep: NONE SEEN
YEAST WET PREP: NONE SEEN

## 2017-04-05 LAB — PREGNANCY, URINE: Preg Test, Ur: NEGATIVE

## 2017-04-05 MED ORDER — AZITHROMYCIN 250 MG PO TABS
1000.0000 mg | ORAL_TABLET | Freq: Once | ORAL | Status: AC
  Administered 2017-04-05: 1000 mg via ORAL
  Filled 2017-04-05: qty 4

## 2017-04-05 MED ORDER — LIDOCAINE HCL (PF) 1 % IJ SOLN
INTRAMUSCULAR | Status: AC
  Administered 2017-04-05: 21:00:00
  Filled 2017-04-05: qty 5

## 2017-04-05 MED ORDER — CEFTRIAXONE SODIUM 250 MG IJ SOLR
250.0000 mg | Freq: Once | INTRAMUSCULAR | Status: AC
  Administered 2017-04-05: 250 mg via INTRAMUSCULAR
  Filled 2017-04-05: qty 250

## 2017-04-05 NOTE — ED Triage Notes (Signed)
Patient c/o whitish vaginal discharge with foul odor for couple days. Patient states little irritation but no itching.

## 2017-04-05 NOTE — Discharge Instructions (Signed)
No sexual contact for at least 1 week or until symptoms have resolved

## 2017-04-05 NOTE — ED Provider Notes (Signed)
[facility] DEPT Provider Note   CSN: [id number] Arrival date & time: 04/05/17  1742     History   Chief Complaint Chief Complaint  Patient presents with  . Vaginal Discharge    HPI [name] [name] is a 26 y.o. female.  The history is provided by the patient.  Vaginal Discharge   This is a new problem. The current episode started 2 days ago. The problem occurs constantly. The problem has been gradually worsening. The discharge occurs spontaneously. The discharge was white, watery and thin. She is not pregnant. She has not missed her period. Associated symptoms include genital itching. Pertinent negatives include no anorexia, no fever, no abdominal swelling, no abdominal pain, no diarrhea, no nausea, no vomiting, no dysuria, no frequency and no genital lesions. She has tried nothing for the symptoms. The treatment provided no relief. Past medical history comments: hx of DM.  2 sexual partners in the last 4 months.  recent sexual activity without protecion..    Past Medical History:  Diagnosis Date  . 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 Diabetes mellitus   . 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 Gonorrhea   . Hirsutism   . PCOS (polycystic ovarian syndrome)     Patient Active Problem List   Diagnosis Date Noted  . Uncontrolled diabetes mellitus ([place]) 06/04/2013  . 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 PCOS (polycystic ovarian syndrome) 01/08/2013    Past Surgical History:  Procedure Laterality Date  . ABSCESS DRAINAGE    . TOOTH EXTRACTION      OB History    Gravida Para Term Preterm AB Living   2 1 1  0 1 1   SAB TAB Ectopic Multiple Live Births   1 0 0 0 1       Home Medications    Prior to Admission medications   Medication Sig Start Date End Date Taking? Authorizing Provider  Blood Glucose Monitoring Suppl (TRUE METRIX METER) w/Device KIT Use as directed 07/28/16   Tresa Garter, MD  glipiZIDE (GLUCOTROL) 5 MG tablet Take 1 tablet (5 mg total) by mouth  daily before breakfast. 03/11/17   Alfonse Spruce, FNP  glucose blood (TRUE METRIX BLOOD GLUCOSE TEST) test strip Use as instructed 03/11/17   Alfonse Spruce, FNP  ibuprofen (ADVIL,MOTRIN) 600 MG tablet Take 1 tablet (600 mg total) by mouth every 8 (eight) hours as needed for moderate pain or cramping (Take with food.). 03/11/17   Alfonse Spruce, FNP  Insulin Glargine (LANTUS SOLOSTAR) 100 UNIT/ML Solostar Pen Inject 18 Units into the skin daily at 10 pm. 03/11/17   Alfonse Spruce, FNP  metFORMIN (GLUCOPHAGE) 500 MG tablet Take 2 tablets (1,000 mg total) by mouth 2 (two) times daily with a meal. 03/11/17   Alfonse Spruce, FNP  TRUEPLUS LANCETS 28G MISC Use as directed 03/11/17   Alfonse Spruce, FNP    Family History Family History  Problem Relation Age of Onset  . Epilepsy Father   . Cancer Father        lung cancer  . COPD Maternal Grandmother   . Hypertension Maternal Grandmother   . Heart disease Maternal Grandmother   . Cancer Paternal Grandfather     Social History Social History  Substance Use Topics  . Smoking status: Current Every Day Smoker    Packs/day: 0.50    Types: Cigarettes  . Smokeless tobacco: Never Used  . Alcohol use 4.8 - 6.0 oz/week    3 - 4  Cans of beer, 5 - 6 Shots of liquor per week     Comment: socially     Allergies   Lisinopril   Review of Systems Review of Systems  Constitutional: Negative for fever.  Gastrointestinal: Negative for abdominal pain, anorexia, diarrhea, nausea and vomiting.  Genitourinary: Positive for vaginal discharge. Negative for dysuria and frequency.  All other systems reviewed and are negative.    Physical Exam Updated Vital Signs BP (!) 147/91 (BP Location: Left Arm)   Pulse 77   Temp 98.7 F (37.1 C) (Oral)   Resp 18   Ht 5\' 7"  (1.702 m)   Wt 127 kg (280 lb)   LMP 03/20/2017   SpO2 99%   BMI 43.85 kg/m   Physical Exam  Constitutional: She is oriented to person, place, and time. She  appears well-developed and well-nourished. No distress.  HENT:  Head: Normocephalic and atraumatic.  Mouth/Throat: Oropharynx is clear and moist.  Eyes: Pupils are equal, round, and reactive to light. Conjunctivae and EOM are normal.  Neck: Normal range of motion. Neck supple.  Cardiovascular: Normal rate, regular rhythm and intact distal pulses.   No murmur heard. Pulmonary/Chest: Effort normal and breath sounds normal. No respiratory distress. She has no wheezes. She has no rales.  Abdominal: Soft. She exhibits no distension. There is no tenderness. There is no rebound and no guarding.  Genitourinary: Uterus normal. Cervix exhibits no motion tenderness, no discharge and no friability. Right adnexum displays no mass, no tenderness and no fullness. Left adnexum displays no mass, no tenderness and no fullness. No tenderness or bleeding in the vagina. Vaginal discharge found.  Musculoskeletal: Normal range of motion. She exhibits no edema or tenderness.  Neurological: She is alert and oriented to person, place, and time.  Skin: Skin is warm and dry. No rash noted. No erythema.  Psychiatric: She has a normal mood and affect. Her behavior is normal.  Nursing note and vitals reviewed.    ED Treatments / Results  Labs (all labs ordered are listed, but only abnormal results are displayed) Labs Reviewed  WET PREP, GENITAL - Abnormal; Notable for the following:       Result Value   WBC, Wet Prep HPF POC MANY (*)    All other components within normal limits  URINALYSIS, ROUTINE W REFLEX MICROSCOPIC - Abnormal; Notable for the following:    Glucose, UA >=500 (*)    Protein, ur 30 (*)    Squamous Epithelial / LPF 0-5 (*)    All other components within normal limits  PREGNANCY, URINE  HIV ANTIBODY (ROUTINE TESTING)  RPR  GC/CHLAMYDIA PROBE AMP ([place]) NOT AT [facility]    EKG  EKG Interpretation None       Radiology No results found.  Procedures Procedures (including critical care  time)  Medications Ordered in ED Medications  cefTRIAXone (ROCEPHIN) injection 250 mg (not administered)  azithromycin (ZITHROMAX) tablet 1,000 mg (not administered)     Initial Impression / Assessment and Plan / ED Course  I have reviewed the triage vital signs and the nursing notes.  Pertinent labs & imaging results that were available during my care of the patient were reviewed by me and considered in my medical decision making (see chart for details).     Patient here with high risk sexual behaviors with vaginal discharge. She is not displaying any symptoms concerning for PID or TOA. UA, UPT, GC chlamydia, wet prep, HIV and syphilis pending. Patient will be treated with Rocephin  and azithromycin.  8:15 PM Wet prep with WBC's but no other findings.  UPT neg and UA without acute findings.  Final Clinical Impressions(s) / ED Diagnoses   Final diagnoses:  Vaginal discharge    New Prescriptions New Prescriptions   No medications on file     Blanchie Dessert, MD 04/05/17 2016

## 2017-04-06 LAB — HIV ANTIBODY (ROUTINE TESTING W REFLEX): HIV Screen 4th Generation wRfx: NONREACTIVE

## 2017-04-06 LAB — GC/CHLAMYDIA PROBE AMP (~~LOC~~) NOT AT ARMC
CHLAMYDIA, DNA PROBE: NEGATIVE
Neisseria Gonorrhea: NEGATIVE

## 2017-04-06 LAB — RPR: RPR Ser Ql: NONREACTIVE

## 2017-05-13 ENCOUNTER — Emergency Department (HOSPITAL_COMMUNITY)
Admission: EM | Admit: 2017-05-13 | Discharge: 2017-05-13 | Disposition: A | Discharge status: Home / Self Care | Payer: Medicaid Other | Attending: Emergency Medicine | Admitting: Emergency Medicine

## 2017-05-13 ENCOUNTER — Other Ambulatory Visit: Payer: Self-pay

## 2017-05-13 ENCOUNTER — Encounter (HOSPITAL_COMMUNITY): Payer: Self-pay | Admitting: *Deleted

## 2017-05-13 DIAGNOSIS — R739 Hyperglycemia, unspecified: Secondary | ICD-10-CM

## 2017-05-13 DIAGNOSIS — Z794 Long term (current) use of insulin: Secondary | ICD-10-CM | POA: Insufficient documentation

## 2017-05-13 DIAGNOSIS — E1165 Type 2 diabetes mellitus with hyperglycemia: Secondary | ICD-10-CM | POA: Insufficient documentation

## 2017-05-13 DIAGNOSIS — F17210 Nicotine dependence, cigarettes, uncomplicated: Secondary | ICD-10-CM | POA: Insufficient documentation

## 2017-05-13 LAB — BASIC METABOLIC PANEL
ANION GAP: 9 (ref 5–15)
BUN: 9 mg/dL (ref 6–20)
CALCIUM: 8.8 mg/dL — AB (ref 8.9–10.3)
CO2: 22 mmol/L (ref 22–32)
Chloride: 103 mmol/L (ref 101–111)
Creatinine, Ser: 0.63 mg/dL (ref 0.44–1.00)
Glucose, Bld: 377 mg/dL — ABNORMAL HIGH (ref 65–99)
Potassium: 3.8 mmol/L (ref 3.5–5.1)
Sodium: 134 mmol/L — ABNORMAL LOW (ref 135–145)

## 2017-05-13 LAB — URINALYSIS, ROUTINE W REFLEX MICROSCOPIC
BILIRUBIN URINE: NEGATIVE
Bacteria, UA: NONE SEEN
Glucose, UA: >=500 mg/dL — AB
Hgb urine dipstick: NEGATIVE
Ketones, ur: 5 mg/dL — AB
Leukocytes, UA: NEGATIVE
NITRITE: NEGATIVE
PROTEIN: NEGATIVE mg/dL
Specific Gravity, Urine: 1.036 — ABNORMAL HIGH (ref 1.005–1.030)
pH: 6 (ref 5.0–8.0)

## 2017-05-13 LAB — CBG MONITORING, ED
GLUCOSE-CAPILLARY: 354 mg/dL — AB (ref 65–99)
GLUCOSE-CAPILLARY: 382 mg/dL — AB (ref 65–99)

## 2017-05-13 LAB — CBC
HCT: 38.7 % (ref 36.0–46.0)
HEMOGLOBIN: 13.2 g/dL (ref 12.0–15.0)
MCH: 30.6 pg (ref 26.0–34.0)
MCHC: 34.1 g/dL (ref 30.0–36.0)
MCV: 89.8 fL (ref 78.0–100.0)
Platelets: 217 10*3/uL (ref 150–400)
RBC: 4.31 MIL/uL (ref 3.87–5.11)
RDW: 12.1 % (ref 11.5–15.5)
WBC: 7 10*3/uL (ref 4.0–10.5)

## 2017-05-13 LAB — POC URINE PREG, ED: Preg Test, Ur: NEGATIVE

## 2017-05-13 MED ORDER — INSULIN GLARGINE 100 UNIT/ML ~~LOC~~ SOLN: 18.0000 [IU] | Freq: Every day | SUBCUTANEOUS | 1 refills | Status: DC

## 2017-05-13 MED ORDER — INSULIN ASPART 100 UNIT/ML ~~LOC~~ SOLN
10.0000 [IU] | Freq: Once | SUBCUTANEOUS | Status: AC
  Administered 2017-05-13: 10 [IU] via SUBCUTANEOUS
  Filled 2017-05-13: qty 1

## 2017-05-13 NOTE — ED Notes (Signed)
Pt verbalizes understanding of d/c instructions. Pt received prescriptions. Pt ambulatory at d/c with all belongings.  

## 2017-05-13 NOTE — ED Triage Notes (Signed)
Pt says that her sugar has been running in the 300-400's. Pt reports she has been taking her insulin as scheduled but is "running low." She has felt lightheaded, nausea, urinary frequency, some blurry vision.

## 2017-05-13 NOTE — ED Provider Notes (Signed)
[place] [facility] EMERGENCY DEPARTMENT Provider Note   CSN: [id number] Arrival date & time: 05/13/17  5784     History   Chief Complaint Chief Complaint  Patient presents with  . Hyperglycemia    HPI [name] [name] is a 26 y.o. female.  The history is provided by the patient.  Hyperglycemia  Severity:  Moderate Onset quality:  Gradual Timing:  Constant Progression:  Worsening Chronicity:  Recurrent Diabetes status:  Controlled with insulin and controlled with oral medications Relieved by:  Nothing Associated symptoms: increased thirst and polyuria   Associated symptoms: no abdominal pain, no chest pain, no fever and no vomiting   pt reports hyperglycemia despite taking her diabetic meds She has nausea, polyuria and polydipsia No other acute complaints  Past Medical History:  Diagnosis Date  . 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 Diabetes mellitus   . 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 Gonorrhea   . Hirsutism   . PCOS (polycystic ovarian syndrome)     Patient Active Problem List   Diagnosis Date Noted  . Uncontrolled diabetes mellitus ([place]) 06/04/2013  . 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 PCOS (polycystic ovarian syndrome) 01/08/2013    Past Surgical History:  Procedure Laterality Date  . ABSCESS DRAINAGE    . TOOTH EXTRACTION      OB History    Gravida Para Term Preterm AB Living   2 1 1  0 1 1   SAB TAB Ectopic Multiple Live Births   1 0 0 0 1       Home Medications    Prior to Admission medications   Medication Sig Start Date End Date Taking? Authorizing Provider  Blood Glucose Monitoring Suppl (TRUE METRIX METER) w/Device KIT Use as directed 07/28/16  Yes Jegede, Olugbemiga E, MD  glipiZIDE (GLUCOTROL) 5 MG tablet Take 1 tablet (5 mg total) by mouth daily before breakfast. 03/11/17  Yes Hairston, Mandesia R, FNP  glucose blood (TRUE METRIX BLOOD GLUCOSE TEST) test strip Use as instructed 03/11/17  Yes Hairston, Mandesia R, FNP    ibuprofen (ADVIL,MOTRIN) 600 MG tablet Take 1 tablet (600 mg total) by mouth every 8 (eight) hours as needed for moderate pain or cramping (Take with food.). 03/11/17  Yes Hairston, Maylon Peppers, FNP  Insulin Glargine (LANTUS SOLOSTAR) 100 UNIT/ML Solostar Pen Inject 18 Units into the skin daily at 10 pm. 03/11/17  Yes Alfonse Spruce, FNP  metFORMIN (GLUCOPHAGE) 500 MG tablet Take 2 tablets (1,000 mg total) by mouth 2 (two) times daily with a meal. 03/11/17  Yes Hairston, Maylon Peppers, FNP  TRUEPLUS LANCETS 28G MISC Use as directed 03/11/17  Yes Alfonse Spruce, FNP    Family History Family History  Problem Relation Age of Onset  . Epilepsy Father   . Cancer Father        lung cancer  . COPD Maternal Grandmother   . Hypertension Maternal Grandmother   . Heart disease Maternal Grandmother   . Cancer Paternal Grandfather     Social History Social History   Tobacco Use  . Smoking status: Current Every Day Smoker    Packs/day: 0.50    Types: Cigarettes  . Smokeless tobacco: Never Used  Substance Use Topics  . Alcohol use: Yes    Alcohol/week: 4.8 - 6.0 oz    Types: 3 - 4 Cans of beer, 5 - 6 Shots of liquor per week    Comment: socially  . Drug use: No     Allergies  Lisinopril   Review of Systems Review of Systems  Constitutional: Negative for fever.  Cardiovascular: Negative for chest pain.  Gastrointestinal: Negative for abdominal pain and vomiting.  Endocrine: Positive for polydipsia and polyuria.  Genitourinary: Positive for frequency.  All other systems reviewed and are negative.    Physical Exam Updated Vital Signs BP 119/68   Pulse 76   Temp 98.9 F (37.2 C) (Oral)   Resp 17   LMP 04/04/2017   SpO2 100%   Physical Exam CONSTITUTIONAL: Well developed/well nourished, she does not smell ketotic HEAD: Normocephalic/atraumatic EYES: EOMI/PERRL ENMT: Mucous membranes moist NECK: supple no meningeal signs SPINE/BACK:entire spine nontender CV: S1/S2  noted, no murmurs/rubs/gallops noted LUNGS: Lungs are clear to auscultation bilaterally, no apparent distress ABDOMEN: soft, nontender, no rebound or guarding, bowel sounds noted throughout abdomen GU:no cva tenderness NEURO: Pt is awake/alert/appropriate, moves all extremitiesx4.  No facial droop.   EXTREMITIES: pulses normal/equal, full ROM SKIN: warm, color normal PSYCH: no abnormalities of mood noted, alert and oriented to situation   ED Treatments / Results  Labs (all labs ordered are listed, but only abnormal results are displayed) Labs Reviewed  BASIC METABOLIC PANEL - Abnormal; Notable for the following components:      Result Value   Sodium 134 (*)    Glucose, Bld 377 (*)    Calcium 8.8 (*)    All other components within normal limits  URINALYSIS, ROUTINE W REFLEX MICROSCOPIC - Abnormal; Notable for the following components:   Specific Gravity, Urine 1.036 (*)    Glucose, UA >=500 (*)    Ketones, ur 5 (*)    Squamous Epithelial / LPF 0-5 (*)    All other components within normal limits  CBG MONITORING, ED - Abnormal; Notable for the following components:   Glucose-Capillary 354 (*)    All other components within normal limits  CBG MONITORING, ED - Abnormal; Notable for the following components:   Glucose-Capillary 382 (*)    All other components within normal limits  CBC  POC URINE PREG, ED    EKG  EKG Interpretation None       Radiology No results found.  Procedures Procedures  Medications Ordered in ED Medications  insulin aspart (novoLOG) injection 10 Units (10 Units Subcutaneous Given 05/13/17 0640)     Initial Impression / Assessment and Plan / ED Course  I have reviewed the triage vital signs and the nursing notes.  Pertinent labs results that were available during my care of the patient were reviewed by me and considered in my medical decision making (see chart for details).    6:50 AM  Pt in the ED for hyperglycemia No signs of DKA No  distress noted, watching TV She reports med compliance despite being on metformin/glipizide and lantus She reports she needs her lantus refilled She will need close PCP followup next week   Final Clinical Impressions(s) / ED Diagnoses   Final diagnoses:  Hyperglycemia    ED Discharge Orders        Ordered    insulin glargine (LANTUS) 100 UNIT/ML injection  Daily at bedtime     05/13/17 0301       Ripley Fraise, MD 05/13/17 [phone]

## 2017-08-15 ENCOUNTER — Emergency Department (HOSPITAL_COMMUNITY)
Admission: EM | Admit: 2017-08-15 | Discharge: 2017-08-15 | Disposition: A | Discharge status: Home / Self Care | Payer: Medicaid Other | Attending: Emergency Medicine | Admitting: Emergency Medicine

## 2017-08-15 ENCOUNTER — Other Ambulatory Visit: Payer: Self-pay

## 2017-08-15 ENCOUNTER — Encounter (HOSPITAL_COMMUNITY): Payer: Self-pay

## 2017-08-15 DIAGNOSIS — Z79899 Other long term (current) drug therapy: Secondary | ICD-10-CM | POA: Insufficient documentation

## 2017-08-15 DIAGNOSIS — Z794 Long term (current) use of insulin: Secondary | ICD-10-CM | POA: Insufficient documentation

## 2017-08-15 DIAGNOSIS — R739 Hyperglycemia, unspecified: Secondary | ICD-10-CM

## 2017-08-15 DIAGNOSIS — N898 Other specified noninflammatory disorders of vagina: Secondary | ICD-10-CM

## 2017-08-15 DIAGNOSIS — F17210 Nicotine dependence, cigarettes, uncomplicated: Secondary | ICD-10-CM | POA: Insufficient documentation

## 2017-08-15 DIAGNOSIS — E1165 Type 2 diabetes mellitus with hyperglycemia: Secondary | ICD-10-CM | POA: Insufficient documentation

## 2017-08-15 LAB — I-STAT BETA HCG BLOOD, ED (MC, WL, AP ONLY): HCG, QUANTITATIVE: 6.9 m[IU]/mL — AB (ref <5)

## 2017-08-15 LAB — URINALYSIS, ROUTINE W REFLEX MICROSCOPIC
BACTERIA UA: NONE SEEN
BILIRUBIN URINE: NEGATIVE
Glucose, UA: >=500 mg/dL — AB
Hgb urine dipstick: NEGATIVE
KETONES UR: 20 mg/dL — AB
Leukocytes, UA: NEGATIVE
NITRITE: NEGATIVE
Protein, ur: 30 mg/dL — AB
Specific Gravity, Urine: 1.028 (ref 1.005–1.030)
pH: 6 (ref 5.0–8.0)

## 2017-08-15 LAB — COMPREHENSIVE METABOLIC PANEL
ALT: 11 U/L — ABNORMAL LOW (ref 14–54)
ANION GAP: 12 (ref 5–15)
AST: 11 U/L — ABNORMAL LOW (ref 15–41)
Albumin: 3.6 g/dL (ref 3.5–5.0)
Alkaline Phosphatase: 79 U/L (ref 38–126)
BUN: 14 mg/dL (ref 6–20)
CHLORIDE: 102 mmol/L (ref 101–111)
CO2: 23 mmol/L (ref 22–32)
Calcium: 9 mg/dL (ref 8.9–10.3)
Creatinine, Ser: 0.62 mg/dL (ref 0.44–1.00)
GFR calc Af Amer: >60 mL/min (ref >60)
GFR calc non Af Amer: >60 mL/min (ref >60)
GLUCOSE: 293 mg/dL — AB (ref 65–99)
POTASSIUM: 4.1 mmol/L (ref 3.5–5.1)
SODIUM: 137 mmol/L (ref 135–145)
Total Bilirubin: 1 mg/dL (ref 0.3–1.2)
Total Protein: 7.2 g/dL (ref 6.5–8.1)

## 2017-08-15 LAB — WET PREP, GENITAL
Clue Cells Wet Prep HPF POC: NONE SEEN
Sperm: NONE SEEN
Trich, Wet Prep: NONE SEEN
Yeast Wet Prep HPF POC: NONE SEEN

## 2017-08-15 LAB — CBC
HEMATOCRIT: 40.1 % (ref 36.0–46.0)
HEMOGLOBIN: 13.5 g/dL (ref 12.0–15.0)
MCH: 31.4 pg (ref 26.0–34.0)
MCHC: 33.7 g/dL (ref 30.0–36.0)
MCV: 93.3 fL (ref 78.0–100.0)
Platelets: 188 10*3/uL (ref 150–400)
RBC: 4.3 MIL/uL (ref 3.87–5.11)
RDW: 12.4 % (ref 11.5–15.5)
WBC: 6.3 10*3/uL (ref 4.0–10.5)

## 2017-08-15 LAB — LIPASE, BLOOD: LIPASE: 29 U/L (ref 11–51)

## 2017-08-15 LAB — POC URINE PREG, ED: Preg Test, Ur: NEGATIVE

## 2017-08-15 MED ORDER — STERILE WATER FOR INJECTION IJ SOLN
INTRAMUSCULAR | Status: AC
  Administered 2017-08-15: 1.2 mL
  Filled 2017-08-15: qty 10

## 2017-08-15 MED ORDER — CEFTRIAXONE SODIUM 250 MG IJ SOLR
250.0000 mg | Freq: Once | INTRAMUSCULAR | Status: AC
  Administered 2017-08-15: 250 mg via INTRAMUSCULAR
  Filled 2017-08-15: qty 250

## 2017-08-15 MED ORDER — METFORMIN HCL 500 MG PO TABS: 500.0000 mg | ORAL_TABLET | Freq: Two times a day (BID) | ORAL | 0 refills | Status: DC

## 2017-08-15 MED ORDER — DOXYCYCLINE HYCLATE 100 MG PO CAPS: 100.0000 mg | ORAL_CAPSULE | Freq: Two times a day (BID) | ORAL | 0 refills | Status: DC

## 2017-08-15 MED ORDER — METFORMIN HCL 500 MG PO TABS: 500.0000 mg | ORAL_TABLET | Freq: Once | ORAL | Status: DC

## 2017-08-15 NOTE — ED Provider Notes (Signed)
[place] EMERGENCY DEPARTMENT Provider Note   CSN: [id number] Arrival date & time: 08/15/17  1217     History   Chief Complaint Chief Complaint  Patient presents with  . Abdominal Pain    HPI [name] [name] is a 27 y.o. female.  HPI   27 year old female presents today with complaints of 3 days of abdominal pain.  Patient notes this pain is in her bilateral lower pelvic region, described as pressure, nonsevere, she denies any upper abdominal pain.  She denies any nausea vomiting, fever chills, denies any changes in her bowel movement.  She notes her urine has been slightly more dark recently, no dysuria.  Patient notes that she has had several days of vaginal discharge.  She notes she was sexually active with a female 2 months ago, and is currently sexually active with a female.  Patient reports she is a diabetic but does not take any medication for this.  Past Medical History:  Diagnosis Date  . 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 Diabetes mellitus   . 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 Gonorrhea   . Hirsutism   . PCOS (polycystic ovarian syndrome)     Patient Active Problem List   Diagnosis Date Noted  . Uncontrolled diabetes mellitus ([place]) 06/04/2013  . 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 PCOS (polycystic ovarian syndrome) 01/08/2013    Past Surgical History:  Procedure Laterality Date  . ABSCESS DRAINAGE    . TOOTH EXTRACTION      OB History    Gravida Para Term Preterm AB Living   2 1 1  0 1 1   SAB TAB Ectopic Multiple Live Births   1 0 0 0 1       Home Medications    Prior to Admission medications   Medication Sig Start Date End Date Taking? Authorizing Provider  Blood Glucose Monitoring Suppl (TRUE METRIX METER) w/Device KIT Use as directed 07/28/16   Tresa Garter, MD  doxycycline (VIBRAMYCIN) 100 MG capsule Take 1 capsule (100 mg total) by mouth 2 (two) times daily. 08/15/17   Kenlee Vogt, Dellis Filbert, PA-C  glipiZIDE (GLUCOTROL)  5 MG tablet Take 1 tablet (5 mg total) by mouth daily before breakfast. 03/11/17   Alfonse Spruce, FNP  glucose blood (TRUE METRIX BLOOD GLUCOSE TEST) test strip Use as instructed 03/11/17   Alfonse Spruce, FNP  ibuprofen (ADVIL,MOTRIN) 600 MG tablet Take 1 tablet (600 mg total) by mouth every 8 (eight) hours as needed for moderate pain or cramping (Take with food.). 03/11/17   Alfonse Spruce, FNP  insulin glargine (LANTUS) 100 UNIT/ML injection Inject 0.18 mLs (18 Units total) at bedtime into the skin. 05/13/17   Ripley Fraise, MD  metFORMIN (GLUCOPHAGE) 500 MG tablet Take 1 tablet (500 mg total) by mouth 2 (two) times daily with a meal. 08/15/17   Layali Freund, Dellis Filbert, PA-C  TRUEPLUS LANCETS 28G MISC Use as directed 03/11/17   Alfonse Spruce, FNP    Family History Family History  Problem Relation Age of Onset  . Epilepsy Father   . Cancer Father        lung cancer  . COPD Maternal Grandmother   . Hypertension Maternal Grandmother   . Heart disease Maternal Grandmother   . Cancer Paternal Grandfather     Social History Social History   Tobacco Use  . Smoking status: Current Every Day Smoker    Packs/day: 0.50    Types: Cigarettes  . Smokeless tobacco: Never  Used  Substance Use Topics  . Alcohol use: Yes    Alcohol/week: 4.8 - 6.0 oz    Types: 3 - 4 Cans of beer, 5 - 6 Shots of liquor per week    Comment: socially  . Drug use: No     Allergies   Lisinopril   Review of Systems Review of Systems  All other systems reviewed and are negative.    Physical Exam Updated Vital Signs BP 128/76 (BP Location: Right Arm)   Pulse 68   Temp 98.2 F (36.8 C) (Oral)   Resp 18   Ht '5\' 7"'$  (1.702 m)   Wt 131.5 kg (290 lb)   SpO2 100%   BMI 45.42 kg/m   Physical Exam  Constitutional: She is oriented to person, place, and time. She appears well-developed and well-nourished.  HENT:  Head: Normocephalic and atraumatic.  Eyes: Conjunctivae are normal. Pupils are  equal, round, and reactive to light. Right eye exhibits no discharge. Left eye exhibits no discharge. No scleral icterus.  Neck: Normal range of motion. No JVD present. No tracheal deviation present.  Pulmonary/Chest: Effort normal. No stridor.  Abdominal:  Very minor tenderness palpation of bilateral lower pelvic region-remainder of abdominal exam nontender to palpation  Genitourinary:  Genitourinary Comments: Purulent vaginal discharge noted in the vaginal vault, no significant cervical motion tenderness, some minor discomfort with motion, no adnexal tenderness or mass  Neurological: She is alert and oriented to person, place, and time. Coordination normal.  Psychiatric: She has a normal mood and affect. Her behavior is normal. Judgment and thought content normal.  Nursing note and vitals reviewed.    ED Treatments / Results  Labs (all labs ordered are listed, but only abnormal results are displayed) Labs Reviewed  WET PREP, GENITAL - Abnormal; Notable for the following components:      Result Value   WBC, Wet Prep HPF POC MANY (*)    All other components within normal limits  COMPREHENSIVE METABOLIC PANEL - Abnormal; Notable for the following components:   Glucose, Bld 293 (*)    AST 11 (*)    ALT 11 (*)    All other components within normal limits  URINALYSIS, ROUTINE W REFLEX MICROSCOPIC - Abnormal; Notable for the following components:   Glucose, UA >=500 (*)    Ketones, ur 20 (*)    Protein, ur 30 (*)    Squamous Epithelial / LPF 0-5 (*)    All other components within normal limits  I-STAT BETA HCG BLOOD, ED ([facility], [facility], [facility] ONLY) - Abnormal; Notable for the following components:   I-stat hCG, quantitative 6.9 (*)    All other components within normal limits  LIPASE, BLOOD  CBC  POC URINE PREG, ED  GC/CHLAMYDIA PROBE AMP ([place]) NOT AT [facility]    EKG  EKG Interpretation None       Radiology No results found.  Procedures Procedures (including critical care  time)  Medications Ordered in ED Medications  cefTRIAXone (ROCEPHIN) injection 250 mg (250 mg Intramuscular Given 08/15/17 1530)  sterile water (preservative free) injection (1.2 mLs  Given 08/15/17 1530)     Initial Impression / Assessment and Plan / ED Course  I have reviewed the triage vital signs and the nursing notes.  Pertinent labs & imaging results that were available during my care of the patient were reviewed by me and considered in my medical decision making (see chart for details).      Final Clinical Impressions(s) / ED Diagnoses  Final diagnoses:  Vaginal discharge  Hyperglycemia    Labs: Wet prep, i-STAT beta-hCG, lipase, CMP, CBC  Imaging:  Consults:  Therapeutics: Ceftriaxone  Discharge Meds: Metformin, doxycycline  Assessment/Plan: 27 year old female presents today with complaints of lower abdominal pain and discharge.  Patient's physical exam consistent with STD, she had no true cervical motion tenderness but did have discomfort in the pelvis.  Patient will be treated with ceftriaxone here and discharged with doxycycline for 2 weeks to cover for STD anteriorly PID.  She has no severe symptoms that require hospital admission for ongoing evaluation or management here.  Patient also noted to be hyperglycemic here.  She notes she is taken metformin in the past but did not follow-up with her primary care provider for medication refill.  I had a lengthy discussion with her on the poor outcome that would likely and see if she did not continue managing her blood sugar.  She has no signs of DKA or hyperosmolar state that would require further evaluation or management.  She will be discharged home with metformin.  She is given strict return precautions, she verbalized understanding and agreement to this plan had no further questions or concerns at the time of discharge.      ED Discharge Orders        Ordered    doxycycline (VIBRAMYCIN) 100 MG capsule  2 times daily      08/15/17 1615    metFORMIN (GLUCOPHAGE) 500 MG tablet  2 times daily with meals     08/15/17 1625       HedgesDellis Filbert, PA-C 08/15/17 1701    Blanchie Dessert, MD 08/16/17 1611

## 2017-08-15 NOTE — Discharge Instructions (Signed)
Please read attached information. If you experience any new or worsening signs or symptoms please return to the emergency room for evaluation. Please follow-up with your primary care provider or specialist as discussed. Please use medication prescribed only as directed and discontinue taking if you have any concerning signs or symptoms.   °

## 2017-08-15 NOTE — ED Triage Notes (Signed)
Per Pt, Pt is coming from home with three days of lower abdominal pain, dark urina, and some vaginal discharge. Denies an N/V/D.

## 2017-08-16 LAB — GC/CHLAMYDIA PROBE AMP (~~LOC~~) NOT AT ARMC
Chlamydia: NEGATIVE
Neisseria Gonorrhea: NEGATIVE

## 2017-10-15 ENCOUNTER — Encounter (HOSPITAL_COMMUNITY): Payer: Self-pay | Admitting: Emergency Medicine

## 2017-10-15 ENCOUNTER — Emergency Department (HOSPITAL_COMMUNITY)
Admission: EM | Admit: 2017-10-15 | Discharge: 2017-10-15 | Disposition: A | Discharge status: Home / Self Care | Payer: Medicaid Other | Attending: Emergency Medicine | Admitting: Emergency Medicine

## 2017-10-15 ENCOUNTER — Other Ambulatory Visit: Payer: Self-pay

## 2017-10-15 DIAGNOSIS — Z79899 Other long term (current) drug therapy: Secondary | ICD-10-CM | POA: Insufficient documentation

## 2017-10-15 DIAGNOSIS — N611 Abscess of the breast and nipple: Secondary | ICD-10-CM | POA: Insufficient documentation

## 2017-10-15 DIAGNOSIS — F17210 Nicotine dependence, cigarettes, uncomplicated: Secondary | ICD-10-CM | POA: Insufficient documentation

## 2017-10-15 MED ORDER — CLINDAMYCIN HCL 300 MG PO CAPS: 300.0000 mg | ORAL_CAPSULE | Freq: Four times a day (QID) | ORAL | 0 refills | Status: DC

## 2017-10-15 NOTE — ED Notes (Signed)
called for triage x 3

## 2017-10-15 NOTE — ED Provider Notes (Signed)
[facility] EMERGENCY DEPARTMENT Provider Note   CSN: [id number] Arrival date & time: 10/15/17  1437     History   Chief Complaint Chief Complaint  Patient presents with  . Abscess    HPI [name] [name] is a 27 y.o. female.  [name] [name] is a 27 y.o. Female with history of diabetes, PCO S and Cushing syndrome, who presents to the ED for evaluation of breast abscess.  Patient reports she first noticed abscess approximately 3 days ago on the right breast near her sternum.  She reports since then it continued to get larger, with constant dull aching pain worse with palpation and when she tries to wear a bra.  Patient reports small amount of purulent drainage today, with some surrounding redness.  Patient denies any other abscesses elsewhere.  Reports history of the same about 4 years ago, she was initially scheduled to have surgery for this, but the abscess improved with clindamycin.  Patient is diabetic and has poorly controlled sugars.  No chest pain or shortness of breath.  Patient denies fevers or chills, nausea, vomiting or general malaise.     Past Medical History:  Diagnosis Date  . Cushing syndrome ([place])   . Diabetes mellitus   . 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 Gonorrhea   . Hirsutism   . PCOS (polycystic ovarian syndrome)     Patient Active Problem List   Diagnosis Date Noted  . Uncontrolled diabetes mellitus ([place]) 06/04/2013  . Cushings syndrome ([place]) 01/08/2013  . PCOS (polycystic ovarian syndrome) 01/08/2013    Past Surgical History:  Procedure Laterality Date  . ABSCESS DRAINAGE    . TOOTH EXTRACTION       OB History    Gravida  2   Para  1   Term  1   Preterm  0   AB  1   Living  1     SAB  1   TAB  0   Ectopic  0   Multiple  0   Live Births  1            Home Medications    Prior to Admission medications   Medication Sig Start Date End Date Taking? Authorizing Provider  Blood Glucose  Monitoring Suppl (TRUE METRIX METER) w/Device KIT Use as directed 07/28/16   Tresa Garter, MD  clindamycin (CLEOCIN) 300 MG capsule Take 1 capsule (300 mg total) by mouth 4 (four) times daily. X 7 days 10/15/17   Jacqlyn Larsen, PA-C  doxycycline (VIBRAMYCIN) 100 MG capsule Take 1 capsule (100 mg total) by mouth 2 (two) times daily. 08/15/17   Hedges, Dellis Filbert, PA-C  glipiZIDE (GLUCOTROL) 5 MG tablet Take 1 tablet (5 mg total) by mouth daily before breakfast. 03/11/17   Alfonse Spruce, FNP  glucose blood (TRUE METRIX BLOOD GLUCOSE TEST) test strip Use as instructed 03/11/17   Alfonse Spruce, FNP  ibuprofen (ADVIL,MOTRIN) 600 MG tablet Take 1 tablet (600 mg total) by mouth every 8 (eight) hours as needed for moderate pain or cramping (Take with food.). 03/11/17   Alfonse Spruce, FNP  insulin glargine (LANTUS) 100 UNIT/ML injection Inject 0.18 mLs (18 Units total) at bedtime into the skin. 05/13/17   Ripley Fraise, MD  metFORMIN (GLUCOPHAGE) 500 MG tablet Take 1 tablet (500 mg total) by mouth 2 (two) times daily with a meal. 08/15/17   Hedges, Dellis Filbert, PA-C  TRUEPLUS LANCETS 28G MISC Use as directed 03/11/17  Alfonse Spruce, FNP    Family History Family History  Problem Relation Age of Onset  . Epilepsy Father   . Cancer Father        lung cancer  . COPD Maternal Grandmother   . Hypertension Maternal Grandmother   . Heart disease Maternal Grandmother   . Cancer Paternal Grandfather     Social History Social History   Tobacco Use  . Smoking status: Current Every Day Smoker    Packs/day: 0.50    Types: Cigarettes  . Smokeless tobacco: Never Used  Substance Use Topics  . Alcohol use: Yes    Alcohol/week: 4.8 - 6.0 oz    Types: 3 - 4 Cans of beer, 5 - 6 Shots of liquor per week    Comment: socially  . Drug use: No     Allergies   Lisinopril   Review of Systems Review of Systems  Constitutional: Negative for chills and fever.  Respiratory: Negative for  shortness of breath.   Cardiovascular: Negative for chest pain.  Gastrointestinal: Negative for nausea and vomiting.  Skin: Positive for color change.       Abscess     Physical Exam Updated Vital Signs BP 130/79 (BP Location: Right Arm)   Pulse 69   Temp 99.1 F (37.3 C) (Oral)   Resp 18   Ht 5' 7"  (1.702 m)   Wt 131.5 kg (290 lb)   SpO2 99%   BMI 45.42 kg/m   Physical Exam  Constitutional: She appears well-developed and well-nourished. No distress.  HENT:  Head: Normocephalic and atraumatic.  Eyes: Right eye exhibits no discharge. Left eye exhibits no discharge.  Cardiovascular: Normal rate, regular rhythm and normal heart sounds.  Pulmonary/Chest: Effort normal and breath sounds normal. No stridor. No respiratory distress. She has no wheezes. She has no rales.  3 x 1.5 cm area of fluctuance on the right breast near the sternum, mild surrounding erythema and warmth, no active drainage with palpation, no other abscesses or breast masses noted.  No nipple drainage or inversion.    Abdominal: Soft. Bowel sounds are normal. She exhibits no distension. There is no tenderness.  Neurological: She is alert. Coordination normal.  Skin: Skin is warm and dry. Capillary refill takes less than 2 seconds. She is not diaphoretic.  Psychiatric: She has a normal mood and affect. Her behavior is normal.  Nursing note and vitals reviewed.    ED Treatments / Results  Labs (all labs ordered are listed, but only abnormal results are displayed) Labs Reviewed - No data to display  EKG None  Radiology No results found.  Procedures Procedures (including critical care time)  Medications Ordered in ED Medications - No data to display   Initial Impression / Assessment and Plan / ED Course  I have reviewed the triage vital signs and the nursing notes.  Pertinent labs & imaging results that were available during my care of the patient were reviewed by me and considered in my medical  decision making (see chart for details).  Patient presents to the ED for evaluation of breast abscess.  No associated fevers, nausea vomiting or generalized malaise.  History of the same which improved with clindamycin.  Patient is diabetic and poorly controlled, she is not exhibiting any symptoms to suggest DKA here today.  Patient is well-appearing with normal vitals.  3 x 1.5 cm abscess on the right breast.  No other abscesses noted.  Lungs clear.  Will place patient on clindamycin and  have her follow-up with [facility] for drainage if abscess does not improve with antibiotics.  Strict return precautions discussed.  Patient expresses understanding and is in agreement with plan.  Final Clinical Impressions(s) / ED Diagnoses   Final diagnoses:  Breast abscess    ED Discharge Orders        Ordered    clindamycin (CLEOCIN) 300 MG capsule  4 times daily     10/15/17 1817       Janet Berlin 10/15/17 2211    Duffy Bruce, MD 10/16/17 1110

## 2017-10-15 NOTE — Discharge Instructions (Signed)
Please complete entire course of antibiotics as directed.  You may use Tylenol or ibuprofen as needed for pain.  If abscess does not improve with antibiotics you will need to follow-up with [facility] for drainage.  You may follow-up with the [facility] to establish primary care to help with diabetes management.  Please return to the emergency department for fevers or chills, nausea, vomiting, significantly worsened pain, or spreading redness over the breast.

## 2017-10-15 NOTE — ED Triage Notes (Signed)
Pt present with a abscess to her right breast by her sternum that started 3 days ago.

## 2017-10-22 ENCOUNTER — Other Ambulatory Visit: Payer: Self-pay

## 2017-10-22 ENCOUNTER — Encounter (HOSPITAL_COMMUNITY): Payer: Self-pay | Admitting: Emergency Medicine

## 2017-10-22 ENCOUNTER — Emergency Department (HOSPITAL_COMMUNITY)
Admission: EM | Admit: 2017-10-22 | Discharge: 2017-10-22 | Disposition: A | Discharge status: Home / Self Care | Payer: Self-pay | Attending: Emergency Medicine | Admitting: Emergency Medicine

## 2017-10-22 DIAGNOSIS — E119 Type 2 diabetes mellitus without complications: Secondary | ICD-10-CM | POA: Insufficient documentation

## 2017-10-22 DIAGNOSIS — E249 Cushing's syndrome, unspecified: Secondary | ICD-10-CM | POA: Insufficient documentation

## 2017-10-22 DIAGNOSIS — F17210 Nicotine dependence, cigarettes, uncomplicated: Secondary | ICD-10-CM | POA: Insufficient documentation

## 2017-10-22 DIAGNOSIS — F1210 Cannabis abuse, uncomplicated: Secondary | ICD-10-CM | POA: Insufficient documentation

## 2017-10-22 DIAGNOSIS — N764 Abscess of vulva: Secondary | ICD-10-CM | POA: Insufficient documentation

## 2017-10-22 DIAGNOSIS — Z794 Long term (current) use of insulin: Secondary | ICD-10-CM | POA: Insufficient documentation

## 2017-10-22 MED ORDER — DOXYCYCLINE HYCLATE 100 MG PO CAPS: 100.0000 mg | ORAL_CAPSULE | Freq: Two times a day (BID) | ORAL | 0 refills | Status: AC

## 2017-10-22 NOTE — ED Provider Notes (Signed)
[facility] DEPT Provider Note   CSN: [id number] Arrival date & time: 10/22/17  2056     History   Chief Complaint Chief Complaint  Patient presents with  . Abscess    HPI [name] [name] is a 27 y.o. female.  HPI  Pt is a 27 y/o female who presents to the ED today to be evaluated for an abscess to the left side of her vagina that has been present for the last several days. States that pain and swelling has been worse over the last several days. She denies any drainage from the area. Pain is 10/10 and constant. Worse when she is walking. She has tried taking tylenol with no relief.   No fevers, abd pain, NVD, urinary or other vaginal sxs. No CP or SOB. States she smoke marijuana PTA which is why her HR is elevated today.  Past Medical History:  Diagnosis Date  . Cushing syndrome ([place])   . Diabetes mellitus   . 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 Gonorrhea   . Hirsutism   . PCOS (polycystic ovarian syndrome)     Patient Active Problem List   Diagnosis Date Noted  . Uncontrolled diabetes mellitus ([place]) 06/04/2013  . 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 PCOS (polycystic ovarian syndrome) 01/08/2013    Past Surgical History:  Procedure Laterality Date  . ABSCESS DRAINAGE    . TOOTH EXTRACTION       OB History    Gravida  2   Para  1   Term  1   Preterm  0   AB  1   Living  1     SAB  1   TAB  0   Ectopic  0   Multiple  0   Live Births  1            Home Medications    Prior to Admission medications   Medication Sig Start Date End Date Taking? Authorizing Provider  Blood Glucose Monitoring Suppl (TRUE METRIX METER) w/Device KIT Use as directed 07/28/16  Yes Jegede, Olugbemiga E, MD  clindamycin (CLEOCIN) 300 MG capsule Take 1 capsule (300 mg total) by mouth 4 (four) times daily. X 7 days 10/15/17  Yes Benedetto Goad N, PA-C  glipiZIDE (GLUCOTROL) 5 MG tablet Take 1 tablet (5 mg total) by mouth daily before  breakfast. 03/11/17  Yes Hairston, Mandesia R, FNP  glucose blood (TRUE METRIX BLOOD GLUCOSE TEST) test strip Use as instructed 03/11/17  Yes Hairston, Mandesia R, FNP  insulin glargine (LANTUS) 100 UNIT/ML injection Inject 0.18 mLs (18 Units total) at bedtime into the skin. 05/13/17  Yes Ripley Fraise, MD  metFORMIN (GLUCOPHAGE) 500 MG tablet Take 1 tablet (500 mg total) by mouth 2 (two) times daily with a meal. 08/15/17  Yes Hedges, Dellis Filbert, PA-C  doxycycline (VIBRAMYCIN) 100 MG capsule Take 1 capsule (100 mg total) by mouth 2 (two) times daily for 7 days. 10/22/17 10/29/17  Shlok Raz S, PA-C  ibuprofen (ADVIL,MOTRIN) 600 MG tablet Take 1 tablet (600 mg total) by mouth every 8 (eight) hours as needed for moderate pain or cramping (Take with food.). Patient not taking: Reported on 10/22/2017 03/11/17   Alfonse Spruce, FNP    Family History Family History  Problem Relation Age of Onset  . Epilepsy Father   . Cancer Father        lung cancer  . COPD Maternal Grandmother   . Hypertension Maternal Grandmother   . Heart  disease Maternal Grandmother   . Cancer Paternal Grandfather     Social History Social History   Tobacco Use  . Smoking status: Current Every Day Smoker    Packs/day: 0.50    Types: Cigarettes  . Smokeless tobacco: Never Used  Substance Use Topics  . Alcohol use: Yes    Alcohol/week: 4.8 - 6.0 oz    Types: 3 - 4 Cans of beer, 5 - 6 Shots of liquor per week    Comment: socially  . Drug use: No     Allergies   Lisinopril   Review of Systems Review of Systems  Constitutional: Negative for chills and fever.  HENT: Negative for sore throat.   Eyes: Negative for visual disturbance.  Respiratory: Negative for shortness of breath.   Cardiovascular: Negative for chest pain.  Gastrointestinal: Negative for abdominal pain, constipation, diarrhea, nausea and vomiting.  Genitourinary: Positive for vaginal pain. Negative for dysuria, flank pain, frequency,  hematuria, pelvic pain, urgency, vaginal bleeding and vaginal discharge.  Musculoskeletal: Negative for neck pain.  Skin:       abscess  Neurological: Negative for headaches.     Physical Exam Updated Vital Signs BP 128/87 (BP Location: Left Arm)   Pulse 96   Temp 98.9 F (37.2 C) (Oral)   Resp 18   Ht 5' 7"  (1.702 m)   Wt 131.5 kg (290 lb)   LMP 09/13/2017   SpO2 97%   BMI 45.42 kg/m   Physical Exam  Constitutional: She appears well-developed and well-nourished. No distress.  HENT:  Head: Normocephalic and atraumatic.  Eyes: Conjunctivae are normal.  Neck: Neck supple.  Cardiovascular: Normal rate, regular rhythm and normal heart sounds.  Pulmonary/Chest: Effort normal and breath sounds normal. She has no wheezes.  Abdominal: Soft. Bowel sounds are normal. She exhibits no distension. There is no tenderness.  Genitourinary:  Genitourinary Comments: Chaperone present during GU exam.  External vaginal exam was performed and patient does have a large 2.5 cm x 2 cm abscess to the left superior portion of the labia majora.  Abscess is actively draining on examination.  Copious amount of sero-purulent, malodorous fluid drained from the abscess.  No vesicular lesions noted.  Area is tender to palpation with surrounding induration.     Musculoskeletal: Normal range of motion.  Neurological: She is alert.  Skin: Skin is warm and dry.  Psychiatric: She has a normal mood and affect.  Nursing note and vitals reviewed.    ED Treatments / Results  Labs (all labs ordered are listed, but only abnormal results are displayed) Labs Reviewed - No data to display  EKG None  Radiology No results found.  Procedures Procedures (including critical care time)  Medications Ordered in ED Medications - No data to display   Initial Impression / Assessment and Plan / ED Course  I have reviewed the triage vital signs and the nursing notes.  Pertinent labs & imaging results that were  available during my care of the patient were reviewed by me and considered in my medical decision making (see chart for details).    Discussed pt presentation and exam findings with Dr. Marcha Dutton, who evaluated the patient and agrees with the plan for discharge with antibiotics and recheck in 48 hours.  During her evaluation patient very adamant that she did not want to have complete incision and drainage procedure performed.   Final Clinical Impressions(s) / ED Diagnoses   Final diagnoses:  Left genital labial abscess   Patient presented  with left genital labial abscess that has been present for 2 days.  She is afebrile.  Initially slightly tachycardic states this is due to marijuana intake prior to arrival.  All vital signs normalized by the time of discharge.  On exam patient had large abscess to upper part of labia that was actively draining a copious amount of seropurulent fluid on exam. No evidence of bartholin cyst on exam. Attempted to drain this during initial evaluation and the abscess was significantly smaller after drainage of fluid.  Patient's pain improved significantly as well.  Discussed further incision and drainage procedure and patient declines incision and drainage at this time.  Feel that this is reasonable given the copious amount of drainage that was already drained during exam.  Will send patient home with Rx for doxycycline and advised her to follow-up in 48 hours for recheck.  Gave her strict instructions to return to the ER for any signs of worsening infection in the meantime.  All questions were answered and patient understands the plan was to return.  ED Discharge Orders        Ordered    doxycycline (VIBRAMYCIN) 100 MG capsule  2 times daily     10/22/17 2244       Rodney Booze, PA-C 10/22/17 2352    Pixie Casino, MD 10/22/17 2354

## 2017-10-22 NOTE — Discharge Instructions (Addendum)
You were given a prescription for antibiotics. Please take the antibiotic prescription fully.    I have prescribed a new medication for you today. It is important that when you pick the prescription up you discuss the potential interactions of this medication with other medications you are taking, including over the counter medications, with the pharmacists.   This new medication has potential side effects. Be sure to contact your primary care provider or return to the emergency department if you are experiencing new symptoms that you are unable to tolerate after starting the medication. You need to receive medical evaluation immediately if you start to experience blistering of the skin, rash, swelling, or difficulty breathing as these signs could indicate a more serious medication side effect.   You will need to follow-up with either the emergency department, urgent care, at [facility], or follow-up with your regular doctor within 48 hours for a recheck of your wound.  If you notice anything is worsening in the meantime such as you are experiencing fevers, worsening pain, increasing redness or swelling to the area, or other signs of infection you will need to return immediately to the ER.

## 2017-10-22 NOTE — ED Triage Notes (Signed)
Pt reports having an abscess to left groin area for the last several days. Pt reports pain with walking

## 2017-12-22 ENCOUNTER — Inpatient Hospital Stay (HOSPITAL_COMMUNITY)
Admission: AD | Admit: 2017-12-22 | Discharge: 2017-12-23 | Disposition: A | Discharge status: Home / Self Care | Payer: Self-pay | Source: Ambulatory Visit | Attending: Obstetrics and Gynecology | Admitting: Obstetrics and Gynecology

## 2017-12-22 ENCOUNTER — Encounter (HOSPITAL_COMMUNITY): Payer: Self-pay | Admitting: *Deleted

## 2017-12-22 DIAGNOSIS — Z5321 Procedure and treatment not carried out due to patient leaving prior to being seen by health care provider: Secondary | ICD-10-CM | POA: Insufficient documentation

## 2017-12-22 LAB — WET PREP, GENITAL
CLUE CELLS WET PREP: NONE SEEN
Sperm: NONE SEEN
TRICH WET PREP: NONE SEEN
YEAST WET PREP: NONE SEEN

## 2017-12-22 LAB — URINALYSIS, ROUTINE W REFLEX MICROSCOPIC
BILIRUBIN URINE: NEGATIVE
Bacteria, UA: NONE SEEN
Glucose, UA: >=500 mg/dL — AB
Hgb urine dipstick: NEGATIVE
KETONES UR: NEGATIVE mg/dL
Nitrite: NEGATIVE
PROTEIN: NEGATIVE mg/dL
Specific Gravity, Urine: 1.03 (ref 1.005–1.030)
pH: 7 (ref 5.0–8.0)

## 2017-12-22 NOTE — MAU Note (Signed)
PT SAYS HE HAS VAG D/C - HAS ODOR- NOTICED ON Tuesday . NO BIRTH CONTROL. LAST SEX-  LAST WEEKEND

## 2017-12-23 NOTE — MAU Note (Signed)
NOT IN LOBBY 

## 2017-12-24 ENCOUNTER — Inpatient Hospital Stay (HOSPITAL_COMMUNITY)
Admission: AD | Admit: 2017-12-24 | Discharge: 2017-12-24 | Disposition: A | Discharge status: Home / Self Care | Payer: Self-pay | Source: Ambulatory Visit | Attending: Obstetrics & Gynecology | Admitting: Obstetrics & Gynecology

## 2017-12-24 ENCOUNTER — Encounter (HOSPITAL_COMMUNITY): Payer: Self-pay | Admitting: *Deleted

## 2017-12-24 DIAGNOSIS — N760 Acute vaginitis: Secondary | ICD-10-CM

## 2017-12-24 DIAGNOSIS — F17210 Nicotine dependence, cigarettes, uncomplicated: Secondary | ICD-10-CM | POA: Insufficient documentation

## 2017-12-24 DIAGNOSIS — Z888 Allergy status to other drugs, medicaments and biological substances status: Secondary | ICD-10-CM | POA: Insufficient documentation

## 2017-12-24 DIAGNOSIS — Z8249 Family history of ischemic heart disease and other diseases of the circulatory system: Secondary | ICD-10-CM | POA: Insufficient documentation

## 2017-12-24 DIAGNOSIS — E282 Polycystic ovarian syndrome: Secondary | ICD-10-CM | POA: Insufficient documentation

## 2017-12-24 DIAGNOSIS — N898 Other specified noninflammatory disorders of vagina: Secondary | ICD-10-CM | POA: Insufficient documentation

## 2017-12-24 DIAGNOSIS — B9689 Other specified bacterial agents as the cause of diseases classified elsewhere: Secondary | ICD-10-CM

## 2017-12-24 DIAGNOSIS — E119 Type 2 diabetes mellitus without complications: Secondary | ICD-10-CM | POA: Insufficient documentation

## 2017-12-24 LAB — URINALYSIS, ROUTINE W REFLEX MICROSCOPIC
BACTERIA UA: NONE SEEN
Bilirubin Urine: NEGATIVE
Glucose, UA: >=500 mg/dL — AB
Hgb urine dipstick: NEGATIVE
Ketones, ur: NEGATIVE mg/dL
Nitrite: NEGATIVE
Protein, ur: 30 mg/dL — AB
SPECIFIC GRAVITY, URINE: 1.028 (ref 1.005–1.030)
pH: 6 (ref 5.0–8.0)

## 2017-12-24 LAB — WET PREP, GENITAL
SPERM: NONE SEEN
Trich, Wet Prep: NONE SEEN
YEAST WET PREP: NONE SEEN

## 2017-12-24 LAB — POCT PREGNANCY, URINE: Preg Test, Ur: NEGATIVE

## 2017-12-24 MED ORDER — METRONIDAZOLE 500 MG PO TABS: 500.0000 mg | ORAL_TABLET | Freq: Two times a day (BID) | ORAL | 0 refills | Status: DC

## 2017-12-24 NOTE — MAU Provider Note (Signed)
History   [name] [name] is a 27 year old female in today with complaints of vaginal discharge and odor for 4 days.  She states she came the other day but the wait was too long so she left.  She denies any other complaints.  She is sexually active and desires STD screening.  CSN: [id number]  Arrival date & time 12/24/17  1844   None     Chief Complaint  Patient presents with  . Vaginal Discharge    HPI  Past Medical History:  Diagnosis Date  . Cushing syndrome ([facility])   . Diabetes mellitus   . 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 Gonorrhea   . Hirsutism   . PCOS (polycystic ovarian syndrome)     Past Surgical History:  Procedure Laterality Date  . ABSCESS DRAINAGE    . TOOTH EXTRACTION      Family History  Problem Relation Age of Onset  . Epilepsy Father   . Cancer Father        lung cancer  . COPD Maternal Grandmother   . Hypertension Maternal Grandmother   . Heart disease Maternal Grandmother   . Cancer Paternal Grandfather     Social History   Tobacco Use  . Smoking status: Current Every Day Smoker    Packs/day: 0.50    Types: Cigarettes  . Smokeless tobacco: Never Used  Substance Use Topics  . Alcohol use: Yes    Alcohol/week: 4.8 - 6.0 oz    Types: 3 - 4 Cans of beer, 5 - 6 Shots of liquor per week    Comment: socially  . Drug use: No    OB History    Gravida  2   Para  1   Term  1   Preterm  0   AB  1   Living  1     SAB  1   TAB  0   Ectopic  0   Multiple  0   Live Births  1           Review of Systems  Constitutional: Negative.   HENT: Negative.   Eyes: Negative.   Respiratory: Negative.   Cardiovascular: Negative.   Gastrointestinal: Negative.   Endocrine: Negative.   Genitourinary: Positive for vaginal discharge.  Musculoskeletal: Negative.   Skin: Negative.   Allergic/Immunologic: Negative.   Neurological: Negative.   Hematological: Negative.   Psychiatric/Behavioral: Negative.     Allergies   Lisinopril  Home Medications    BP 134/66 (BP Location: Right Arm)   Pulse 82   Temp 98.7 F (37.1 C) (Oral)   Resp 20   Wt 271 lb 0.6 oz (122.9 kg)   LMP  (LMP Unknown)   SpO2 99% Comment: ra  BMI 43.75 kg/m   Physical Exam  Constitutional: She is oriented to person, place, and time. She appears well-developed and well-nourished.  Neck: Normal range of motion.  Cardiovascular: Normal rate, regular rhythm, normal heart sounds and intact distal pulses.  Pulmonary/Chest: Effort normal and breath sounds normal.  Abdominal: Soft. Bowel sounds are normal.  Genitourinary: Vaginal discharge found.  Neurological: She is alert and oriented to person, place, and time.  Skin: Skin is warm and dry.  Psychiatric: She has a normal mood and affect. Her behavior is normal. Judgment and thought content normal.    MAU Course  Procedures (including critical care time)  Labs Reviewed  WET PREP, GENITAL  URINALYSIS, ROUTINE W REFLEX MICROSCOPIC  HIV ANTIBODY (ROUTINE TESTING)  RPR  GC/CHLAMYDIA PROBE AMP ([place]) NOT [facility]   No results found.   1. Vaginal discharge   2. Vaginal odor       MDM  VSS. Wet prep . Cultures obtained. Strong amine odor noted with exam. HIV and RPR. Wet prep clue. Will treat for BV and d/c home

## 2017-12-24 NOTE — Discharge Instructions (Signed)

## 2017-12-24 NOTE — MAU Note (Signed)
[name] [name] is a 27 y.o. here in MAU reporting: +vaginal discharge x4-5 days Thin, yellow, and odorous  LMP: unsure of dates; "mid may sometime"; states has irregular periods; has not taken a HPT; is unsure if pregnant as she states she is having unprotected sex Pain score: none Vitals:   12/24/17 1901  BP: 134/66  Pulse: 82  Resp: 20  Temp: 98.7 F (37.1 C)  SpO2: 99%     Lab orders placed from triage: ua and pregnancy test

## 2017-12-25 LAB — RPR: RPR Ser Ql: NONREACTIVE

## 2017-12-25 LAB — HIV ANTIBODY (ROUTINE TESTING W REFLEX): HIV Screen 4th Generation wRfx: NONREACTIVE

## 2017-12-26 LAB — GC/CHLAMYDIA PROBE AMP (~~LOC~~) NOT AT ARMC
Chlamydia: NEGATIVE
Chlamydia: NEGATIVE
Neisseria Gonorrhea: NEGATIVE
Neisseria Gonorrhea: NEGATIVE

## 2018-01-16 ENCOUNTER — Encounter (HOSPITAL_COMMUNITY): Payer: Self-pay | Admitting: Emergency Medicine

## 2018-01-16 ENCOUNTER — Emergency Department (HOSPITAL_COMMUNITY)
Admission: EM | Admit: 2018-01-16 | Discharge: 2018-01-16 | Disposition: A | Discharge status: Home / Self Care | Payer: Medicaid Other | Attending: Emergency Medicine | Admitting: Emergency Medicine

## 2018-01-16 ENCOUNTER — Other Ambulatory Visit: Payer: Self-pay

## 2018-01-16 DIAGNOSIS — F17210 Nicotine dependence, cigarettes, uncomplicated: Secondary | ICD-10-CM | POA: Insufficient documentation

## 2018-01-16 DIAGNOSIS — N898 Other specified noninflammatory disorders of vagina: Secondary | ICD-10-CM | POA: Insufficient documentation

## 2018-01-16 DIAGNOSIS — E119 Type 2 diabetes mellitus without complications: Secondary | ICD-10-CM | POA: Insufficient documentation

## 2018-01-16 DIAGNOSIS — Z760 Encounter for issue of repeat prescription: Secondary | ICD-10-CM

## 2018-01-16 DIAGNOSIS — Z794 Long term (current) use of insulin: Secondary | ICD-10-CM | POA: Insufficient documentation

## 2018-01-16 DIAGNOSIS — R42 Dizziness and giddiness: Secondary | ICD-10-CM | POA: Insufficient documentation

## 2018-01-16 DIAGNOSIS — R739 Hyperglycemia, unspecified: Secondary | ICD-10-CM

## 2018-01-16 DIAGNOSIS — Z79899 Other long term (current) drug therapy: Secondary | ICD-10-CM | POA: Insufficient documentation

## 2018-01-16 DIAGNOSIS — B379 Candidiasis, unspecified: Secondary | ICD-10-CM

## 2018-01-16 LAB — I-STAT BETA HCG BLOOD, ED (MC, WL, AP ONLY)

## 2018-01-16 LAB — COMPREHENSIVE METABOLIC PANEL
ALT: 11 U/L (ref 0–44)
AST: 11 U/L — AB (ref 15–41)
Albumin: 3.6 g/dL (ref 3.5–5.0)
Alkaline Phosphatase: 83 U/L (ref 38–126)
Anion gap: 7 (ref 5–15)
BUN: 9 mg/dL (ref 6–20)
CHLORIDE: 102 mmol/L (ref 98–111)
CO2: 29 mmol/L (ref 22–32)
Calcium: 9.3 mg/dL (ref 8.9–10.3)
Creatinine, Ser: 0.67 mg/dL (ref 0.44–1.00)
GFR calc Af Amer: >60 mL/min (ref >60)
Glucose, Bld: 361 mg/dL — ABNORMAL HIGH (ref 70–99)
POTASSIUM: 3.8 mmol/L (ref 3.5–5.1)
Sodium: 138 mmol/L (ref 135–145)
Total Bilirubin: 0.9 mg/dL (ref 0.3–1.2)
Total Protein: 7.6 g/dL (ref 6.5–8.1)

## 2018-01-16 LAB — URINALYSIS, ROUTINE W REFLEX MICROSCOPIC
Bacteria, UA: NONE SEEN
Bilirubin Urine: NEGATIVE
HGB URINE DIPSTICK: NEGATIVE
Ketones, ur: NEGATIVE mg/dL
Nitrite: NEGATIVE
PH: 6 (ref 5.0–8.0)
Protein, ur: NEGATIVE mg/dL
Specific Gravity, Urine: 1.03 (ref 1.005–1.030)

## 2018-01-16 LAB — CBC
HCT: 40.6 % (ref 36.0–46.0)
Hemoglobin: 13.4 g/dL (ref 12.0–15.0)
MCH: 31 pg (ref 26.0–34.0)
MCHC: 33 g/dL (ref 30.0–36.0)
MCV: 94 fL (ref 78.0–100.0)
PLATELETS: 226 10*3/uL (ref 150–400)
RBC: 4.32 MIL/uL (ref 3.87–5.11)
RDW: 11.9 % (ref 11.5–15.5)
WBC: 6.4 10*3/uL (ref 4.0–10.5)

## 2018-01-16 LAB — CBG MONITORING, ED: Glucose-Capillary: 352 mg/dL — ABNORMAL HIGH (ref 70–99)

## 2018-01-16 LAB — LIPASE, BLOOD: LIPASE: 40 U/L (ref 11–51)

## 2018-01-16 MED ORDER — METFORMIN HCL 500 MG PO TABS: 500.0000 mg | ORAL_TABLET | Freq: Two times a day (BID) | ORAL | 1 refills | Status: DC

## 2018-01-16 MED ORDER — FLUCONAZOLE 150 MG PO TABS
150.0000 mg | ORAL_TABLET | Freq: Once | ORAL | Status: AC
  Administered 2018-01-16: 150 mg via ORAL
  Filled 2018-01-16: qty 1

## 2018-01-16 MED ORDER — METFORMIN HCL 500 MG PO TABS
500.0000 mg | ORAL_TABLET | Freq: Once | ORAL | Status: AC
  Administered 2018-01-16: 500 mg via ORAL
  Filled 2018-01-16: qty 1

## 2018-01-16 MED ORDER — FLUCONAZOLE 150 MG PO TABS: 150.0000 mg | ORAL_TABLET | ORAL | 0 refills | Status: AC | PRN

## 2018-01-16 MED ORDER — INSULIN GLARGINE 100 UNIT/ML ~~LOC~~ SOLN: 15.0000 [IU] | Freq: Every day | SUBCUTANEOUS | 1 refills | Status: DC

## 2018-01-16 NOTE — ED Notes (Signed)
Pt tearful, frustrated with the wait time, stating she never should have come here. Pt given box of tissues. Pt has not been able to get her metformin or insulin for 2-3 months r/t lack of insurance.

## 2018-01-16 NOTE — ED Provider Notes (Signed)
Patient placed in Quick Look pathway, seen and evaluated   Chief Complaint: Dizziness  HPI:   27 y.o. is a female presenting with sudden on set dizziness this afternoon. Reports associated polydipsia. Checked her blood sugar at work and it was >500. She was taking insulin and oral DM meds, but has been out "for month" She also endorses vaginal discharge and think she has a yeast infection. No confusion, fever, or vomiting.    ROS: Dizziness, vaginal discharge, polydipsia (one)  Physical Exam:   Gen: No distress  Neuro: Awake and Alert  Skin: Warm    Focused Exam: NAD. A & O x3. Heart is RRR. Lungs are CTAB.    Initiation of care has begun. The patient has been counseled on the process, plan, and necessity for staying for the completion/evaluation, and the remainder of the medical screening examination    Barkley BoardsMcDonald, Elya Diloreto A, PA-C 01/16/18 1339    Charlynne PanderYao, David Hsienta, MD 01/17/18 [phone]

## 2018-01-16 NOTE — ED Triage Notes (Addendum)
Pt states she is type 2 diabetic. Has not been able to afford her medications for months, including oral and insulin. Pt states at work she felt dizzy and like she was going to pass out. States she checked her CBG and it was in the 500s. Pt also reports she thinks she has a yeast infection.

## 2018-01-16 NOTE — Discharge Instructions (Addendum)
See the [facility] wellness team is discussed to see if they can help you get your medications.

## 2018-01-16 NOTE — ED Provider Notes (Signed)
[facility] EMERGENCY DEPARTMENT Provider Note   CSN: [id number] Arrival date & time: 01/16/18  1238     History   Chief Complaint Chief Complaint  Patient presents with  . Hyperglycemia    HPI [name] [name] is a 27 y.o. female.  HPI  27 year old female with history of PCOS, type 2 diabetes with noncompliance comes in with chief complaint of elevated blood sugar and also vaginal discharge.  Patient reports that she has not been able to take her medications because of lack of insurance and no prescriptions.  She noted earlier today that she was dizzy and lightheaded, and her blood sugar was coming back is elevated.  Patient denies any chest pain, palpitations, numbness, tingling, vision changes.  Additionally, patient also reports that she has been having some vaginal discharge.  Recently she was started on Flagyl because of bacterial vaginosis.  She has had yeast infection in the past.  Past Medical History:  Diagnosis Date  . 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 Diabetes mellitus   . 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 Gonorrhea   . Hirsutism   . PCOS (polycystic ovarian syndrome)     Patient Active Problem List   Diagnosis Date Noted  . Uncontrolled diabetes mellitus ([place]) 06/04/2013  . 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 PCOS (polycystic ovarian syndrome) 01/08/2013    Past Surgical History:  Procedure Laterality Date  . ABSCESS DRAINAGE    . TOOTH EXTRACTION       OB History    Gravida  2   Para  1   Term  1   Preterm  0   AB  1   Living  1     SAB  1   TAB  0   Ectopic  0   Multiple  0   Live Births  1            Home Medications    Prior to Admission medications   Medication Sig Start Date End Date Taking? Authorizing Provider  Blood Glucose Monitoring Suppl (TRUE METRIX METER) w/Device KIT Use as directed 07/28/16   Tresa Garter, MD  clindamycin (CLEOCIN) 300 MG capsule Take 1 capsule (300 mg total)  by mouth 4 (four) times daily. X 7 days 10/15/17   Jacqlyn Larsen, PA-C  fluconazole (DIFLUCAN) 150 MG tablet Take 1 tablet (150 mg total) by mouth every three (3) days as needed for up to 3 days. 01/19/18 01/22/18  Varney Biles, MD  glipiZIDE (GLUCOTROL) 5 MG tablet Take 1 tablet (5 mg total) by mouth daily before breakfast. 03/11/17   Alfonse Spruce, FNP  glucose blood (TRUE METRIX BLOOD GLUCOSE TEST) test strip Use as instructed 03/11/17   Alfonse Spruce, FNP  ibuprofen (ADVIL,MOTRIN) 600 MG tablet Take 1 tablet (600 mg total) by mouth every 8 (eight) hours as needed for moderate pain or cramping (Take with food.). Patient not taking: Reported on 10/22/2017 03/11/17   Alfonse Spruce, FNP  insulin glargine (LANTUS) 100 UNIT/ML injection Inject 0.15 mLs (15 Units total) into the skin at bedtime. 01/16/18   Varney Biles, MD  metFORMIN (GLUCOPHAGE) 500 MG tablet Take 1 tablet (500 mg total) by mouth 2 (two) times daily with a meal. 01/16/18   Varney Biles, MD  metroNIDAZOLE (FLAGYL) 500 MG tablet Take 1 tablet (500 mg total) by mouth 2 (two) times daily. 12/24/17   Keitha Butte, CNM    Family History Family History  Problem  Relation Age of Onset  . Epilepsy Father   . Cancer Father        lung cancer  . COPD Maternal Grandmother   . Hypertension Maternal Grandmother   . Heart disease Maternal Grandmother   . Cancer Paternal Grandfather     Social History Social History   Tobacco Use  . Smoking status: Current Every Day Smoker    Packs/day: 0.50    Types: Cigarettes  . Smokeless tobacco: Never Used  Substance Use Topics  . Alcohol use: Yes    Alcohol/week: 4.8 - 6.0 oz    Types: 3 - 4 Cans of beer, 5 - 6 Shots of liquor per week    Comment: socially  . Drug use: No     Allergies   Lisinopril   Review of Systems Review of Systems  Constitutional: Positive for activity change.  Respiratory: Negative for shortness of breath.   Cardiovascular: Negative for  chest pain.  Skin: Negative for rash.  Neurological: Positive for dizziness.  All other systems reviewed and are negative.    Physical Exam Updated Vital Signs BP (!) 103/59   Pulse 69   Temp 98.5 F (36.9 C) (Oral)   Resp 16   Ht '5\' 7"'$  (1.702 m)   LMP 01/02/2018   SpO2 95%   BMI 42.45 kg/m   Physical Exam  Constitutional: She is oriented to person, place, and time. She appears well-developed.  HENT:  Head: Normocephalic and atraumatic.  Eyes: EOM are normal.  Neck: Normal range of motion. Neck supple.  Cardiovascular: Normal rate.  Pulmonary/Chest: Effort normal.  Abdominal: Bowel sounds are normal.  Neurological: She is alert and oriented to person, place, and time.  Skin: Skin is warm and dry.  Nursing note and vitals reviewed.    ED Treatments / Results  Labs (all labs ordered are listed, but only abnormal results are displayed) Labs Reviewed  URINALYSIS, ROUTINE W REFLEX MICROSCOPIC - Abnormal; Notable for the following components:      Result Value   Glucose, UA >=500 (*)    Leukocytes, UA TRACE (*)    All other components within normal limits  COMPREHENSIVE METABOLIC PANEL - Abnormal; Notable for the following components:   Glucose, Bld 361 (*)    AST 11 (*)    All other components within normal limits  CBG MONITORING, ED - Abnormal; Notable for the following components:   Glucose-Capillary 352 (*)    All other components within normal limits  CBC  LIPASE, BLOOD  I-STAT BETA HCG BLOOD, ED ([facility], [facility], [facility] ONLY)    EKG None  Radiology No results found.  Procedures Procedures (including critical care time)  Medications Ordered in ED Medications  fluconazole (DIFLUCAN) tablet 150 mg (150 mg Oral Given 01/16/18 2004)  metFORMIN (GLUCOPHAGE) tablet 500 mg (500 mg Oral Given 01/16/18 1955)     Initial Impression / Assessment and Plan / ED Course  I have reviewed the triage vital signs and the nursing notes.  Pertinent labs & imaging results that  were available during my care of the patient were reviewed by me and considered in my medical decision making (see chart for details).     27 year old female comes in with elevated blood sugar.  She has uncontrolled diabetes and PCOS.  She is also complaining of vaginal discharge, with recent antibiotics.  It is possible that she is having yeast infection because of her poorly controlled diabetes or recent antibiotics.  We will give her Diflucan.  Patient is noted to have elevated blood sugar but she does not have DKA.  We have called case management to see if they can help patient with prescription refill.  We will start her on metformin 500 mg twice daily and Lantus 50 mg nightly, which is her normal dosing.  Patient has seen: Wellness in the past, and we have encouraged that she calls: Wellness for further resources and also optimal follow-up.  Final Clinical Impressions(s) / ED Diagnoses   Final diagnoses:  Hyperglycemia  Medication refill  Yeast infection    ED Discharge Orders        Ordered    fluconazole (DIFLUCAN) 150 MG tablet  Every 3 DAYS PRN     01/16/18 2015    metFORMIN (GLUCOPHAGE) 500 MG tablet  2 times daily with meals     01/16/18 2010    insulin glargine (LANTUS) 100 UNIT/ML injection  Daily at bedtime     01/16/18 2010       Varney Biles, MD 01/16/18 2018

## 2018-01-16 NOTE — ED Notes (Signed)
Patient verbalizes understanding of discharge instructions. Opportunity for questioning and answers were provided. Armband removed by staff, pt discharged from ED ambulatory.   

## 2018-01-23 ENCOUNTER — Telehealth: Payer: Self-pay | Admitting: Endocrinology

## 2018-01-23 NOTE — Telephone Encounter (Signed)
OK 

## 2018-01-23 NOTE — Telephone Encounter (Signed)
-----   Message from Sherlyn HayStephanie L Vaughn sent at 01/17/2018  3:52 PM EDT ----- Regarding: self referral Pt just goes to the ER every time she has an episode with her blood sugars. Please review and advise

## 2018-02-24 ENCOUNTER — Encounter (HOSPITAL_COMMUNITY): Payer: Self-pay | Admitting: *Deleted

## 2018-02-24 ENCOUNTER — Inpatient Hospital Stay (HOSPITAL_COMMUNITY)
Admission: AD | Admit: 2018-02-24 | Discharge: 2018-02-24 | Disposition: A | Discharge status: Home / Self Care | Payer: Medicaid Other | Source: Ambulatory Visit | Attending: Obstetrics and Gynecology | Admitting: Obstetrics and Gynecology

## 2018-02-24 DIAGNOSIS — O26891 Other specified pregnancy related conditions, first trimester: Secondary | ICD-10-CM | POA: Insufficient documentation

## 2018-02-24 DIAGNOSIS — F17210 Nicotine dependence, cigarettes, uncomplicated: Secondary | ICD-10-CM | POA: Insufficient documentation

## 2018-02-24 DIAGNOSIS — O24911 Unspecified diabetes mellitus in pregnancy, first trimester: Secondary | ICD-10-CM | POA: Insufficient documentation

## 2018-02-24 DIAGNOSIS — O9989 Other specified diseases and conditions complicating pregnancy, childbirth and the puerperium: Secondary | ICD-10-CM

## 2018-02-24 DIAGNOSIS — Z3A01 Less than 8 weeks gestation of pregnancy: Secondary | ICD-10-CM | POA: Insufficient documentation

## 2018-02-24 DIAGNOSIS — O219 Vomiting of pregnancy, unspecified: Secondary | ICD-10-CM | POA: Insufficient documentation

## 2018-02-24 DIAGNOSIS — Z7984 Long term (current) use of oral hypoglycemic drugs: Secondary | ICD-10-CM | POA: Insufficient documentation

## 2018-02-24 DIAGNOSIS — O99331 Smoking (tobacco) complicating pregnancy, first trimester: Secondary | ICD-10-CM | POA: Insufficient documentation

## 2018-02-24 DIAGNOSIS — N898 Other specified noninflammatory disorders of vagina: Secondary | ICD-10-CM | POA: Insufficient documentation

## 2018-02-24 LAB — URINALYSIS, ROUTINE W REFLEX MICROSCOPIC
BACTERIA UA: NONE SEEN
Bilirubin Urine: NEGATIVE
Glucose, UA: >=500 mg/dL — AB
HGB URINE DIPSTICK: NEGATIVE
Ketones, ur: 20 mg/dL — AB
Leukocytes, UA: NEGATIVE
NITRITE: NEGATIVE
Protein, ur: 30 mg/dL — AB
SPECIFIC GRAVITY, URINE: 1.021 (ref 1.005–1.030)
pH: 7 (ref 5.0–8.0)

## 2018-02-24 LAB — WET PREP, GENITAL
SPERM: NONE SEEN
TRICH WET PREP: NONE SEEN
Yeast Wet Prep HPF POC: NONE SEEN

## 2018-02-24 LAB — POCT PREGNANCY, URINE: PREG TEST UR: POSITIVE — AB

## 2018-02-24 MED ORDER — PROMETHAZINE HCL 25 MG PO TABS: 12.5000 mg | ORAL_TABLET | Freq: Four times a day (QID) | ORAL | 0 refills | Status: DC | PRN

## 2018-02-24 NOTE — MAU Note (Signed)
Pt reports she had 2 positive home preg test, comes in due to discharge that is clear.

## 2018-02-24 NOTE — MAU Provider Note (Signed)
History     CSN: [id number]  Arrival date and time: 02/24/18 1010   First Provider Initiated Contact with Patient 02/24/18 1222      Chief Complaint  Patient presents with  . Possible Pregnancy  . Vaginal Discharge   Vaginal Discharge  The patient's primary symptoms include vaginal discharge. The patient's pertinent negatives include no pelvic pain. This is a new problem. Episode onset: 3 days ago. The problem occurs constantly. The problem has been unchanged. The patient is experiencing no pain. She is pregnant. Associated symptoms include nausea. Pertinent negatives include no chills, dysuria, fever, frequency, urgency or vomiting. The vaginal discharge was mucoid. There has been no bleeding. Nothing aggravates the symptoms. She has tried nothing for the symptoms. Her menstrual history has been irregular (LMP 01/02/18 ).    OB History    Gravida  3   Para  1   Term  1   Preterm  0   AB  1   Living  1     SAB  1   TAB  0   Ectopic  0   Multiple  0   Live Births  1           Past Medical History:  Diagnosis Date  . 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 Diabetes mellitus   . 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 Gonorrhea   . Hirsutism   . PCOS (polycystic ovarian syndrome)     Past Surgical History:  Procedure Laterality Date  . ABSCESS DRAINAGE    . TOOTH EXTRACTION      Family History  Problem Relation Age of Onset  . Epilepsy Father   . Cancer Father        lung cancer  . COPD Maternal Grandmother   . Hypertension Maternal Grandmother   . Heart disease Maternal Grandmother   . Cancer Paternal Grandfather     Social History   Tobacco Use  . Smoking status: Current Every Day Smoker    Packs/day: 0.50    Types: Cigarettes  . Smokeless tobacco: Never Used  Substance Use Topics  . Alcohol use: Yes    Alcohol/week: 8.0 - 10.0 standard drinks    Types: 3 - 4 Cans of beer, 5 - 6 Shots of liquor per week    Comment: socially  . Drug use: No     Allergies:  Allergies  Allergen Reactions  . Lisinopril Other (See Comments)    Chest pains    Medications Prior to Admission  Medication Sig Dispense Refill Last Dose  . Blood Glucose Monitoring Suppl (TRUE METRIX METER) w/Device KIT Use as directed 1 kit 0 Past Month at Unknown time  . clindamycin (CLEOCIN) 300 MG capsule Take 1 capsule (300 mg total) by mouth 4 (four) times daily. X 7 days 28 capsule 0 10/22/2017 at Unknown time  . glipiZIDE (GLUCOTROL) 5 MG tablet Take 1 tablet (5 mg total) by mouth daily before breakfast. 30 tablet 2 Past Month at Unknown time  . glucose blood (TRUE METRIX BLOOD GLUCOSE TEST) test strip Use as instructed 100 each 12 Past Month at Unknown time  . ibuprofen (ADVIL,MOTRIN) 600 MG tablet Take 1 tablet (600 mg total) by mouth every 8 (eight) hours as needed for moderate pain or cramping (Take with food.). (Patient not taking: Reported on 10/22/2017) 30 tablet 0 Not Taking at Unknown time  . insulin glargine (LANTUS) 100 UNIT/ML injection Inject 0.15 mLs (15 Units total) into the skin at bedtime. 10  mL 1   . metFORMIN (GLUCOPHAGE) 500 MG tablet Take 1 tablet (500 mg total) by mouth 2 (two) times daily with a meal. 60 tablet 1   . metroNIDAZOLE (FLAGYL) 500 MG tablet Take 1 tablet (500 mg total) by mouth 2 (two) times daily. 14 tablet 0     Review of Systems  Constitutional: Negative for chills and fever.  Gastrointestinal: Positive for nausea. Negative for vomiting.  Genitourinary: Positive for vaginal discharge. Negative for dysuria, frequency, pelvic pain, urgency and vaginal bleeding.   Physical Exam   Blood pressure (!) 149/78, pulse 82, temperature 98.8 F (37.1 C), temperature source Oral, resp. rate 16, height 5' 7"  (1.702 m), weight 123.8 kg, last menstrual period 01/02/2018, SpO2 100 %.  Physical Exam  Nursing note and vitals reviewed. Constitutional: She is oriented to person, place, and time. She appears well-developed and well-nourished.  No distress.  HENT:  Head: Normocephalic.  Cardiovascular: Normal rate.  Respiratory: Effort normal.  GI: Soft. There is no tenderness. There is no rebound.  Neurological: She is alert and oriented to person, place, and time.  Skin: Skin is warm and dry.  Psychiatric: She has a normal mood and affect.     Results for orders placed or performed during the hospital encounter of 02/24/18 (from the past 24 hour(s))  Urinalysis, Routine w reflex microscopic     Status: Abnormal   Collection Time: 02/24/18 10:24 AM  Result Value Ref Range   Color, Urine YELLOW YELLOW   APPearance CLEAR CLEAR   Specific Gravity, Urine 1.021 1.005 - 1.030   pH 7.0 5.0 - 8.0   Glucose, UA >=500 (A) NEGATIVE mg/dL   Hgb urine dipstick NEGATIVE NEGATIVE   Bilirubin Urine NEGATIVE NEGATIVE   Ketones, ur 20 (A) NEGATIVE mg/dL   Protein, ur 30 (A) NEGATIVE mg/dL   Nitrite NEGATIVE NEGATIVE   Leukocytes, UA NEGATIVE NEGATIVE   RBC / HPF 0-5 0 - 5 RBC/hpf   WBC, UA 0-5 0 - 5 WBC/hpf   Bacteria, UA NONE SEEN NONE SEEN   Squamous Epithelial / LPF 6-10 0 - 5   Mucus PRESENT   Wet prep, genital     Status: Abnormal   Collection Time: 02/24/18 10:25 AM  Result Value Ref Range   Yeast Wet Prep HPF POC NONE SEEN NONE SEEN   Trich, Wet Prep NONE SEEN NONE SEEN   Clue Cells Wet Prep HPF POC PRESENT (A) NONE SEEN   WBC, Wet Prep HPF POC FEW (A) NONE SEEN   Sperm NONE SEEN   Pregnancy, urine POC     Status: Abnormal   Collection Time: 02/24/18 10:34 AM  Result Value Ref Range   Preg Test, Ur POSITIVE (A) NEGATIVE   MAU Course  Procedures  MDM   Assessment and Plan   1. Vaginal discharge   2. Nausea and vomiting in pregnancy    DC home Comfort measures reviewed  1st Trimester precautions  RX: phenergan PRN #[id number]  Return to MAU as needed FU with OB as planned  [facility] for [facility] Follow up.   Specialty:  Obstetrics and Gynecology Contact information: [place] 02/24/2018, 12:22 PM

## 2018-02-24 NOTE — Discharge Instructions (Signed)

## 2018-02-27 LAB — GC/CHLAMYDIA PROBE AMP (~~LOC~~) NOT AT ARMC
Chlamydia: NEGATIVE
NEISSERIA GONORRHEA: NEGATIVE

## 2018-02-28 ENCOUNTER — Emergency Department (HOSPITAL_COMMUNITY)
Admission: EM | Admit: 2018-02-28 | Discharge: 2018-02-28 | Disposition: A | Discharge status: Home / Self Care | Payer: Medicaid Other | Attending: Emergency Medicine | Admitting: Emergency Medicine

## 2018-02-28 ENCOUNTER — Encounter (HOSPITAL_COMMUNITY): Payer: Self-pay

## 2018-02-28 ENCOUNTER — Other Ambulatory Visit: Payer: Self-pay

## 2018-02-28 DIAGNOSIS — E249 Cushing's syndrome, unspecified: Secondary | ICD-10-CM | POA: Insufficient documentation

## 2018-02-28 DIAGNOSIS — O24811 Other pre-existing diabetes mellitus in pregnancy, first trimester: Secondary | ICD-10-CM | POA: Insufficient documentation

## 2018-02-28 DIAGNOSIS — Z794 Long term (current) use of insulin: Secondary | ICD-10-CM | POA: Insufficient documentation

## 2018-02-28 DIAGNOSIS — Z3A08 8 weeks gestation of pregnancy: Secondary | ICD-10-CM | POA: Insufficient documentation

## 2018-02-28 DIAGNOSIS — Z3491 Encounter for supervision of normal pregnancy, unspecified, first trimester: Secondary | ICD-10-CM

## 2018-02-28 DIAGNOSIS — R739 Hyperglycemia, unspecified: Secondary | ICD-10-CM | POA: Insufficient documentation

## 2018-02-28 LAB — CBC
HCT: 36.2 % (ref 36.0–46.0)
Hemoglobin: 12.6 g/dL (ref 12.0–15.0)
MCH: 31.4 pg (ref 26.0–34.0)
MCHC: 34.8 g/dL (ref 30.0–36.0)
MCV: 90.3 fL (ref 78.0–100.0)
Platelets: 226 10*3/uL (ref 150–400)
RBC: 4.01 MIL/uL (ref 3.87–5.11)
RDW: 12.3 % (ref 11.5–15.5)
WBC: 6.8 10*3/uL (ref 4.0–10.5)

## 2018-02-28 LAB — BASIC METABOLIC PANEL
ANION GAP: 10 (ref 5–15)
BUN: 12 mg/dL (ref 6–20)
CALCIUM: 9.1 mg/dL (ref 8.9–10.3)
CO2: 25 mmol/L (ref 22–32)
Chloride: 101 mmol/L (ref 98–111)
Creatinine, Ser: 0.62 mg/dL (ref 0.44–1.00)
GFR calc non Af Amer: >60 mL/min (ref >60)
Glucose, Bld: 300 mg/dL — ABNORMAL HIGH (ref 70–99)
Potassium: 3.5 mmol/L (ref 3.5–5.1)
Sodium: 136 mmol/L (ref 135–145)

## 2018-02-28 LAB — URINALYSIS, ROUTINE W REFLEX MICROSCOPIC
BILIRUBIN URINE: NEGATIVE
Hgb urine dipstick: NEGATIVE
KETONES UR: 5 mg/dL — AB
LEUKOCYTES UA: NEGATIVE
Nitrite: NEGATIVE
PH: 6 (ref 5.0–8.0)
PROTEIN: NEGATIVE mg/dL
Specific Gravity, Urine: 1.026 (ref 1.005–1.030)

## 2018-02-28 LAB — HEPATIC FUNCTION PANEL
ALBUMIN: 3.6 g/dL (ref 3.5–5.0)
ALK PHOS: 68 U/L (ref 38–126)
ALT: 9 U/L (ref 0–44)
AST: 10 U/L — AB (ref 15–41)
BILIRUBIN TOTAL: 0.6 mg/dL (ref 0.3–1.2)
Bilirubin, Direct: 0.1 mg/dL (ref 0.0–0.2)
Indirect Bilirubin: 0.5 mg/dL (ref 0.3–0.9)
Total Protein: 7.5 g/dL (ref 6.5–8.1)

## 2018-02-28 LAB — CBG MONITORING, ED
Glucose-Capillary: 250 mg/dL — ABNORMAL HIGH (ref 70–99)
Glucose-Capillary: 285 mg/dL — ABNORMAL HIGH (ref 70–99)
Glucose-Capillary: 214 mg/dL — ABNORMAL HIGH (ref 70–99)

## 2018-02-28 LAB — HCG, QUANTITATIVE, PREGNANCY: hCG, Beta Chain, Quant, S: 38201 m[IU]/mL — ABNORMAL HIGH (ref <5)

## 2018-02-28 MED ORDER — INSULIN ASPART 100 UNIT/ML ~~LOC~~ SOLN
3.0000 [IU] | Freq: Once | SUBCUTANEOUS | Status: AC
  Administered 2018-02-28: 3 [IU] via SUBCUTANEOUS
  Filled 2018-02-28: qty 1

## 2018-02-28 MED ORDER — SODIUM CHLORIDE 0.9 % IV BOLUS
1000.0000 mL | Freq: Once | INTRAVENOUS | Status: AC
  Administered 2018-02-28: 1000 mL via INTRAVENOUS

## 2018-02-28 NOTE — ED Provider Notes (Signed)
[name] Provider Note   CSN: [id number] Arrival date & time: 02/28/18  1405     History   Chief Complaint Chief Complaint  Patient presents with  . Hyperglycemia  . Routine Prenatal Visit    patient states [redacted] weeks pregnant    HPI [name] [name] is a 27 y.o. female.  27 year old female with past medical history including Cushing syndrome, PCOS, type 2 diabetes mellitus, approximately [redacted] weeks pregnant by last menstrual period who presents with hyperglycemia.  The patient was seen at the MAU last week for home positive pregnancy test and nausea as well as vaginal discharge.  She was discharged home with confirmation of pregnancy.  She states that she has continued to have hyperglycemia at home, blood glucose is usually in the 250s daily, despite compliance with her metformin and 70/30 insulin.  She was seen by endocrinology and started on these medications but this was before the diagnosis of pregnancy.  She notes some increased urination but no dysuria, vaginal bleeding, or abdominal pain.  She reports nausea, generalized weakness and fatigue; no fevers or vomiting.  She has been trying to follow diabetic diet.  The history is provided by the patient.  Hyperglycemia    Past Medical History:  Diagnosis Date  . 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 Diabetes mellitus   . 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 Gonorrhea   . Hirsutism   . PCOS (polycystic ovarian syndrome)     Patient Active Problem List   Diagnosis Date Noted  . Uncontrolled diabetes mellitus ([place]) 06/04/2013  . 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) 01/08/2013  . PCOS (polycystic ovarian syndrome) 01/08/2013    Past Surgical History:  Procedure Laterality Date  . ABSCESS DRAINAGE    . TOOTH EXTRACTION       OB History    Gravida  3   Para  1   Term  1   Preterm  0   AB  1   Living  1     SAB  1   TAB  0   Ectopic  0   Multiple  0   Live Births  1             Home Medications    Prior to Admission medications   Medication Sig Start Date End Date Taking? Authorizing Provider  metFORMIN (GLUCOPHAGE) 500 MG tablet Take 1 tablet (500 mg total) by mouth 2 (two) times daily with a meal. 01/16/18  Yes Nanavati, Ankit, MD  Blood Glucose Monitoring Suppl (TRUE METRIX METER) w/Device KIT Use as directed 07/28/16   Tresa Garter, MD  glipiZIDE (GLUCOTROL) 5 MG tablet Take 1 tablet (5 mg total) by mouth daily before breakfast. Patient not taking: Reported on 02/28/2018 03/11/17   Alfonse Spruce, FNP  glucose blood (TRUE METRIX BLOOD GLUCOSE TEST) test strip Use as instructed 03/11/17   Alfonse Spruce, FNP  insulin glargine (LANTUS) 100 UNIT/ML injection Inject 0.15 mLs (15 Units total) into the skin at bedtime. Patient not taking: Reported on 02/28/2018 01/16/18   Varney Biles, MD  promethazine (PHENERGAN) 25 MG tablet Take 0.5-1 tablets (12.5-25 mg total) by mouth every 6 (six) hours as needed. Patient not taking: Reported on 02/28/2018 02/24/18   Tresea Mall, CNM    Family History Family History  Problem Relation Age of Onset  . Epilepsy Father   . Cancer Father        lung cancer  . COPD Maternal Grandmother   .  Hypertension Maternal Grandmother   . Heart disease Maternal Grandmother   . Cancer Paternal Grandfather     Social History Social History   Tobacco Use  . Smoking status: Current Every Day Smoker    Packs/day: 0.50    Types: Cigarettes  . Smokeless tobacco: Never Used  Substance Use Topics  . Alcohol use: Yes    Alcohol/week: 8.0 - 10.0 standard drinks    Types: 3 - 4 Cans of beer, 5 - 6 Shots of liquor per week    Comment: socially  . Drug use: No     Allergies   Lisinopril   Review of Systems Review of Systems All other systems reviewed and are negative except that which was mentioned in HPI   Physical Exam Updated Vital Signs BP (!) 145/84 (BP Location: Right Arm)   Pulse 71   Temp 98.4  F (36.9 C) (Oral)   Resp 17   Ht _0  (1.702 m)   Wt 123.8 kg   LMP 01/02/2018   SpO2 99%   BMI 42.76 kg/m   Physical Exam  Constitutional: She is oriented to person, place, and time. She appears well-developed and well-nourished. No distress.  HENT:  Head: Normocephalic and atraumatic.  Moist mucous membranes  Eyes: Conjunctivae are normal.  Neck: Neck supple.  Cardiovascular: Normal rate, regular rhythm and normal heart sounds.  No murmur heard. Pulmonary/Chest: Effort normal and breath sounds normal.  Abdominal: Soft. Bowel sounds are normal. She exhibits no distension. There is no tenderness.  Musculoskeletal: She exhibits no edema.  Neurological: She is alert and oriented to person, place, and time.  Fluent speech  Skin: Skin is warm and dry.  Psychiatric: She has a normal mood and affect. Judgment normal.  Nursing note and vitals reviewed.    ED Treatments / Results  Labs (all labs ordered are listed, but only abnormal results are displayed) Labs Reviewed  BASIC METABOLIC PANEL - Abnormal; Notable for the following components:      Result Value   Glucose, Bld 300 (*)    All other components within normal limits  URINALYSIS, ROUTINE W REFLEX MICROSCOPIC - Abnormal; Notable for the following components:   Glucose, UA >=500 (*)    Ketones, ur 5 (*)    Bacteria, UA RARE (*)    All other components within normal limits  HCG, QUANTITATIVE, PREGNANCY - Abnormal; Notable for the following components:   hCG, Beta Chain, Quant, S 38,201 (*)    All other components within normal limits  HEPATIC FUNCTION PANEL - Abnormal; Notable for the following components:   AST 10 (*)    All other components within normal limits  CBG MONITORING, ED - Abnormal; Notable for the following components:   Glucose-Capillary 250 (*)    All other components within normal limits  CBG MONITORING, ED - Abnormal; Notable for the following components:   Glucose-Capillary 285 (*)    All other  components within normal limits  CBG MONITORING, ED - Abnormal; Notable for the following components:   Glucose-Capillary 214 (*)    All other components within normal limits  CBC    EKG None  Radiology No results found.  Procedures Procedures (including critical care time)  Medications Ordered in ED Medications  sodium chloride 0.9 % bolus 1,000 mL (0 mLs Intravenous Stopped 02/28/18 1834)  insulin aspart (novoLOG) injection 3 Units (3 Units Subcutaneous Given 02/28/18 1646)     Initial Impression / Assessment and Plan / ED Course  I  have reviewed the triage vital signs and the nursing notes.  Pertinent labs that were available during my care of the patient were reviewed by me and considered in my medical decision making (see chart for details).     BG 250 in triage, pt was noted to be eating [employer] sandwich. Repeat was 285. Gave IVF bolus and small dose novolog.   Lab work shows beta hCG of 38,000, normal CBC, hyperglycemia on CMP with no evidence of DKA.  UA with glucose, no evidence of infection.  Blood glucose improved to 214 after therapies.  Pressure was mildly elevated on initial exam but improved without intervention on repeat exams.  I explained that this would need to be watched closely by her OB/GYN.  Emphasized the importance of contacting for next available appointment to establish care for first trimester pregnancy.  She has no pregnancy related complaints today.  Regarding her diabetes management, I contacted [facility], where she recently established care, and discussed with Dr. Soundra Pilon. I appreciate her assistance. She has recommended increasing 70/30 from 15 to 17u BID and continuing metformin.  Have spent greater than 5 minutes counseling patient on diabetic diet, importance of medication compliance, and need for close follow-up in the endocrine clinic.  They will contact her for a close appointment.  I have reviewed return precautions with her and she voiced  understanding.  Final Clinical Impressions(s) / ED Diagnoses   Final diagnoses:  Hyperglycemia  First trimester pregnancy    ED Discharge Orders    None       Felise Georgia, Wenda Overland, MD 03/01/18 0000

## 2018-02-28 NOTE — Discharge Instructions (Addendum)
START TAKING 17U INSULIN (70/30) TWICE DAILY ALONG WITH METFORMIN TWICE DAILY. STRICTLY FOLLOW DIABETIC DIET AND DRINK PLENTY OF FLUIDS. FOLLOW UP WITH ENDOCRINOLOGY AND [facility]OMEN[facility]S CLINIC FOR OB CARE.

## 2018-02-28 NOTE — ED Triage Notes (Addendum)
Patient c/o hyperglycemia and CBG in triage -250. Patient was eating [employer] while in triage.  Patient added prior to finishing triage that she was [redacted] weeks pregnant.

## 2018-03-16 ENCOUNTER — Other Ambulatory Visit: Payer: Self-pay

## 2018-03-16 ENCOUNTER — Inpatient Hospital Stay (HOSPITAL_COMMUNITY)
Admission: AD | Admit: 2018-03-16 | Discharge: 2018-03-16 | Disposition: A | Discharge status: Home / Self Care | Payer: Medicaid Other | Source: Ambulatory Visit | Attending: Obstetrics & Gynecology | Admitting: Obstetrics & Gynecology

## 2018-03-16 ENCOUNTER — Encounter (HOSPITAL_COMMUNITY): Payer: Self-pay | Admitting: *Deleted

## 2018-03-16 DIAGNOSIS — E119 Type 2 diabetes mellitus without complications: Secondary | ICD-10-CM | POA: Insufficient documentation

## 2018-03-16 DIAGNOSIS — Z7984 Long term (current) use of oral hypoglycemic drugs: Secondary | ICD-10-CM | POA: Insufficient documentation

## 2018-03-16 DIAGNOSIS — F17210 Nicotine dependence, cigarettes, uncomplicated: Secondary | ICD-10-CM | POA: Insufficient documentation

## 2018-03-16 DIAGNOSIS — N760 Acute vaginitis: Secondary | ICD-10-CM

## 2018-03-16 DIAGNOSIS — B9689 Other specified bacterial agents as the cause of diseases classified elsewhere: Secondary | ICD-10-CM

## 2018-03-16 DIAGNOSIS — Z332 Encounter for elective termination of pregnancy: Secondary | ICD-10-CM

## 2018-03-16 LAB — COMPREHENSIVE METABOLIC PANEL
ALBUMIN: 3.6 g/dL (ref 3.5–5.0)
ALK PHOS: 69 U/L (ref 38–126)
ALT: 13 U/L (ref 0–44)
ANION GAP: 10 (ref 5–15)
AST: 11 U/L — ABNORMAL LOW (ref 15–41)
BUN: 8 mg/dL (ref 6–20)
CALCIUM: 8.5 mg/dL — AB (ref 8.9–10.3)
CO2: 22 mmol/L (ref 22–32)
CREATININE: 0.47 mg/dL (ref 0.44–1.00)
Chloride: 105 mmol/L (ref 98–111)
GFR calc non Af Amer: >60 mL/min (ref >60)
Glucose, Bld: 224 mg/dL — ABNORMAL HIGH (ref 70–99)
POTASSIUM: 3.4 mmol/L — AB (ref 3.5–5.1)
Sodium: 137 mmol/L (ref 135–145)
TOTAL PROTEIN: 7.4 g/dL (ref 6.5–8.1)
Total Bilirubin: 0.7 mg/dL (ref 0.3–1.2)

## 2018-03-16 LAB — CBC WITH DIFFERENTIAL/PLATELET
BASOS PCT: 0 %
Basophils Absolute: 0 10*3/uL (ref 0.0–0.1)
EOS ABS: 0.1 10*3/uL (ref 0.0–0.7)
EOS PCT: 1 %
HCT: 32 % — ABNORMAL LOW (ref 36.0–46.0)
HEMOGLOBIN: 10.9 g/dL — AB (ref 12.0–15.0)
Lymphocytes Relative: 31 %
Lymphs Abs: 2 10*3/uL (ref 0.7–4.0)
MCH: 31.7 pg (ref 26.0–34.0)
MCHC: 34.1 g/dL (ref 30.0–36.0)
MCV: 93 fL (ref 78.0–100.0)
MONOS PCT: 4 %
Monocytes Absolute: 0.2 10*3/uL (ref 0.1–1.0)
NEUTROS PCT: 64 %
Neutro Abs: 4.2 10*3/uL (ref 1.7–7.7)
PLATELETS: 215 10*3/uL (ref 150–400)
RBC: 3.44 MIL/uL — AB (ref 3.87–5.11)
RDW: 12.6 % (ref 11.5–15.5)
WBC: 6.5 10*3/uL (ref 4.0–10.5)

## 2018-03-16 LAB — URINALYSIS, ROUTINE W REFLEX MICROSCOPIC
Bilirubin Urine: NEGATIVE
HGB URINE DIPSTICK: NEGATIVE
Ketones, ur: NEGATIVE mg/dL
NITRITE: NEGATIVE
Protein, ur: 30 mg/dL — AB
SPECIFIC GRAVITY, URINE: 1.022 (ref 1.005–1.030)
pH: 6 (ref 5.0–8.0)

## 2018-03-16 LAB — WET PREP, GENITAL
Sperm: NONE SEEN
TRICH WET PREP: NONE SEEN
Yeast Wet Prep HPF POC: NONE SEEN

## 2018-03-16 LAB — LIPASE, BLOOD: LIPASE: 26 U/L (ref 11–51)

## 2018-03-16 LAB — POCT PREGNANCY, URINE: Preg Test, Ur: POSITIVE — AB

## 2018-03-16 MED ORDER — METRONIDAZOLE 500 MG PO TABS: 500.0000 mg | ORAL_TABLET | Freq: Two times a day (BID) | ORAL | 0 refills | Status: DC

## 2018-03-16 NOTE — MAU Provider Note (Signed)
History     CSN: [id number]  Arrival date and time: 03/16/18 1216   First Provider Initiated Contact with Patient 03/16/18 1328      Chief Complaint  Patient presents with  . Headache  . Abdominal Pain   HPI [name] is a 27 y.o. [phone] s/p TAB ~ 2 weeks ago in [place] who presents to MAU today with complaint of foul smelling discharge and upper abdominal pain x 4 days. The patient states pain is mild to moderate. She denies GI symptoms, vaginal bleeding, fever today. She has had intermittent headaches. She also complains of a thick, clear discharge.   OB History    Gravida  3   Para  1   Term  1   Preterm  0   AB  2   Living  1     SAB  1   TAB  1   Ectopic  0   Multiple  0   Live Births  1           Past Medical History:  Diagnosis Date  . 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 Diabetes mellitus    Type II  . 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 Gonorrhea   . Hirsutism   . PCOS (polycystic ovarian syndrome)     Past Surgical History:  Procedure Laterality Date  . ABSCESS DRAINAGE    . THERAPEUTIC ABORTION    . TOOTH EXTRACTION      Family History  Problem Relation Age of Onset  . Epilepsy Father   . Cancer Father        lung cancer  . COPD Maternal Grandmother   . Hypertension Maternal Grandmother   . Heart disease Maternal Grandmother   . Cancer Paternal Grandfather     Social History   Tobacco Use  . Smoking status: Current Every Day Smoker    Packs/day: 0.25    Types: Cigarettes  . Smokeless tobacco: Never Used  Substance Use Topics  . Alcohol use: Not Currently    Alcohol/week: 8.0 - 10.0 standard drinks    Types: 3 - 4 Cans of beer, 5 - 6 Shots of liquor per week    Comment: socially  . Drug use: No    Allergies:  Allergies  Allergen Reactions  . Lisinopril Other (See Comments) and Shortness Of Breath    Chest pains Chest pains and tightness    Medications Prior to Admission  Medication Sig Dispense Refill  Last Dose  . Blood Glucose Monitoring Suppl (TRUE METRIX METER) w/Device KIT Use as directed 1 kit 0 Past Month at Unknown time  . glipiZIDE (GLUCOTROL) 5 MG tablet Take 1 tablet (5 mg total) by mouth daily before breakfast. (Patient not taking: Reported on 02/28/2018) 30 tablet 2 Not Taking at Unknown time  . glucose blood (TRUE METRIX BLOOD GLUCOSE TEST) test strip Use as instructed 100 each 12 Past Month at Unknown time  . insulin glargine (LANTUS) 100 UNIT/ML injection Inject 0.15 mLs (15 Units total) into the skin at bedtime. (Patient not taking: Reported on 02/28/2018) 10 mL 1 Not Taking at Unknown time  . metFORMIN (GLUCOPHAGE) 500 MG tablet Take 1 tablet (500 mg total) by mouth 2 (two) times daily with a meal. 60 tablet 1 02/28/2018 at Unknown time  . promethazine (PHENERGAN) 25 MG tablet Take 0.5-1 tablets (12.5-25 mg total) by mouth every 6 (six) hours as needed. (Patient not taking: Reported on 02/28/2018) 30 tablet 0 Not Taking at  Unknown time    Review of Systems  Constitutional: Negative for fever.  Gastrointestinal: Positive for abdominal pain. Negative for constipation, diarrhea, nausea and vomiting.  Genitourinary: Positive for vaginal discharge. Negative for dysuria, frequency, urgency and vaginal bleeding.   Physical Exam   Blood pressure 129/88, pulse 72, temperature 98.9 F (37.2 C), temperature source Oral, resp. rate 16, weight 126.7 kg, last menstrual period 01/02/2018, SpO2 99 %, unknown if currently breastfeeding.  Physical Exam  Nursing note and vitals reviewed. Constitutional: She is oriented to person, place, and time. She appears well-developed and well-nourished. No distress.  HENT:  Head: Normocephalic and atraumatic.  Cardiovascular: Normal rate.  Respiratory: Effort normal.  GI: Soft. She exhibits no distension and no mass. There is no tenderness. There is no rebound and no guarding.  Genitourinary: Uterus is not enlarged and not tender. Cervix exhibits no  motion tenderness, no discharge and no friability. Right adnexum displays no mass and no tenderness. Left adnexum displays no mass and no tenderness. No bleeding in the vagina. Vaginal discharge (scant thin, white malodorous discharge) found.  Neurological: She is alert and oriented to person, place, and time.  Skin: Skin is warm and dry. No erythema.  Psychiatric: She has a normal mood and affect.    Results for orders placed or performed during the hospital encounter of 03/16/18 (from the past 24 hour(s))  Urinalysis, Routine w reflex microscopic     Status: Abnormal   Collection Time: 03/16/18  1:01 PM  Result Value Ref Range   Color, Urine YELLOW YELLOW   APPearance HAZY (A) CLEAR   Specific Gravity, Urine 1.022 1.005 - 1.030   pH 6.0 5.0 - 8.0   Glucose, UA >=500 (A) NEGATIVE mg/dL   Hgb urine dipstick NEGATIVE NEGATIVE   Bilirubin Urine NEGATIVE NEGATIVE   Ketones, ur NEGATIVE NEGATIVE mg/dL   Protein, ur 30 (A) NEGATIVE mg/dL   Nitrite NEGATIVE NEGATIVE   Leukocytes, UA TRACE (A) NEGATIVE   RBC / HPF 0-5 0 - 5 RBC/hpf   WBC, UA 6-10 0 - 5 WBC/hpf   Bacteria, UA RARE (A) NONE SEEN   Squamous Epithelial / LPF 11-20 0 - 5   Mucus PRESENT   Pregnancy, urine POC     Status: Abnormal   Collection Time: 03/16/18  1:09 PM  Result Value Ref Range   Preg Test, Ur POSITIVE (A) NEGATIVE  Wet prep, genital     Status: Abnormal   Collection Time: 03/16/18  1:43 PM  Result Value Ref Range   Yeast Wet Prep HPF POC NONE SEEN NONE SEEN   Trich, Wet Prep NONE SEEN NONE SEEN   Clue Cells Wet Prep HPF POC PRESENT (A) NONE SEEN   WBC, Wet Prep HPF POC MANY (A) NONE SEEN   Sperm NONE SEEN   CBC with Differential/Platelet     Status: Abnormal   Collection Time: 03/16/18  1:53 PM  Result Value Ref Range   WBC 6.5 4.0 - 10.5 K/uL   RBC 3.44 (L) 3.87 - 5.11 MIL/uL   Hemoglobin 10.9 (L) 12.0 - 15.0 g/dL   HCT 32.0 (L) 36.0 - 46.0 %   MCV 93.0 78.0 - 100.0 fL   MCH 31.7 26.0 - 34.0 pg   MCHC  34.1 30.0 - 36.0 g/dL   RDW 12.6 11.5 - 15.5 %   Platelets 215 150 - 400 K/uL   Neutrophils Relative % 64 %   Neutro Abs 4.2 1.7 - 7.7 K/uL   Lymphocytes  Relative 31 %   Lymphs Abs 2.0 0.7 - 4.0 K/uL   Monocytes Relative 4 %   Monocytes Absolute 0.2 0.1 - 1.0 K/uL   Eosinophils Relative 1 %   Eosinophils Absolute 0.1 0.0 - 0.7 K/uL   Basophils Relative 0 %   Basophils Absolute 0.0 0.0 - 0.1 K/uL  Comprehensive metabolic panel     Status: Abnormal   Collection Time: 03/16/18  1:53 PM  Result Value Ref Range   Sodium 137 135 - 145 mmol/L   Potassium 3.4 (L) 3.5 - 5.1 mmol/L   Chloride 105 98 - 111 mmol/L   CO2 22 22 - 32 mmol/L   Glucose, Bld 224 (H) 70 - 99 mg/dL   BUN 8 6 - 20 mg/dL   Creatinine, Ser 0.47 0.44 - 1.00 mg/dL   Calcium 8.5 (L) 8.9 - 10.3 mg/dL   Total Protein 7.4 6.5 - 8.1 g/dL   Albumin 3.6 3.5 - 5.0 g/dL   AST 11 (L) 15 - 41 U/L   ALT 13 0 - 44 U/L   Alkaline Phosphatase 69 38 - 126 U/L   Total Bilirubin 0.7 0.3 - 1.2 mg/dL   GFR calc non Af Amer >60 >60 mL/min   GFR calc Af Amer >60 >60 mL/min   Anion gap 10 5 - 15  Lipase, blood     Status: None   Collection Time: 03/16/18  1:53 PM  Result Value Ref Range   Lipase 26 11 - 51 U/L    MAU Course  Procedures None  MDM CBC, CMP, Lipase, Wet prep, GC/Chlamydia, HIV and RPR today   Assessment and Plan  A: Bacterial vaginosis   P: Discharge home Rx for Flagyl given to patient  Warning signs for worsening condition discussed Patient advised to follow-up with PCP, contact information given for [facility] and [facility] Patient may return to MAU as needed or if her condition were to change or worsen  Kerry Hough, PA-C 03/16/2018, 2:32 PM

## 2018-03-16 NOTE — MAU Note (Signed)
About 2wks had an [place] in [place], procedure- everything went fine.  About 4 days ago, started having pain in abd, from epigastric area down, started having bad headaches. Called the clinic, was told to see primary or ER.  Denies fever, diarrhea, or dizziness

## 2018-03-16 NOTE — Discharge Instructions (Signed)

## 2018-03-17 ENCOUNTER — Emergency Department (HOSPITAL_COMMUNITY)
Admission: EM | Admit: 2018-03-17 | Discharge: 2018-03-17 | Discharge status: Against Medical Advice | Payer: Medicaid Other | Attending: Emergency Medicine | Admitting: Emergency Medicine

## 2018-03-17 ENCOUNTER — Encounter (HOSPITAL_COMMUNITY): Payer: Self-pay | Admitting: *Deleted

## 2018-03-17 ENCOUNTER — Encounter (HOSPITAL_COMMUNITY): Payer: Self-pay

## 2018-03-17 ENCOUNTER — Emergency Department (HOSPITAL_COMMUNITY)
Admission: EM | Admit: 2018-03-17 | Discharge: 2018-03-17 | Disposition: A | Discharge status: Home / Self Care | Payer: Medicaid Other | Attending: Emergency Medicine | Admitting: Emergency Medicine

## 2018-03-17 ENCOUNTER — Other Ambulatory Visit: Payer: Self-pay

## 2018-03-17 DIAGNOSIS — F17210 Nicotine dependence, cigarettes, uncomplicated: Secondary | ICD-10-CM | POA: Insufficient documentation

## 2018-03-17 DIAGNOSIS — R1013 Epigastric pain: Secondary | ICD-10-CM | POA: Insufficient documentation

## 2018-03-17 DIAGNOSIS — R109 Unspecified abdominal pain: Secondary | ICD-10-CM | POA: Insufficient documentation

## 2018-03-17 DIAGNOSIS — G44209 Tension-type headache, unspecified, not intractable: Secondary | ICD-10-CM

## 2018-03-17 DIAGNOSIS — Z5321 Procedure and treatment not carried out due to patient leaving prior to being seen by health care provider: Secondary | ICD-10-CM | POA: Insufficient documentation

## 2018-03-17 DIAGNOSIS — E119 Type 2 diabetes mellitus without complications: Secondary | ICD-10-CM | POA: Insufficient documentation

## 2018-03-17 DIAGNOSIS — Z7984 Long term (current) use of oral hypoglycemic drugs: Secondary | ICD-10-CM | POA: Insufficient documentation

## 2018-03-17 LAB — COMPREHENSIVE METABOLIC PANEL
ALBUMIN: 3.5 g/dL (ref 3.5–5.0)
ALT: 15 U/L (ref 0–44)
AST: 13 U/L — AB (ref 15–41)
Alkaline Phosphatase: 74 U/L (ref 38–126)
Anion gap: 9 (ref 5–15)
BUN: 9 mg/dL (ref 6–20)
CHLORIDE: 106 mmol/L (ref 98–111)
CO2: 27 mmol/L (ref 22–32)
CREATININE: 0.66 mg/dL (ref 0.44–1.00)
Calcium: 8.7 mg/dL — ABNORMAL LOW (ref 8.9–10.3)
GFR calc Af Amer: >60 mL/min (ref >60)
GLUCOSE: 228 mg/dL — AB (ref 70–99)
POTASSIUM: 3.6 mmol/L (ref 3.5–5.1)
SODIUM: 142 mmol/L (ref 135–145)
Total Bilirubin: 0.5 mg/dL (ref 0.3–1.2)
Total Protein: 7 g/dL (ref 6.5–8.1)

## 2018-03-17 LAB — HIV ANTIBODY (ROUTINE TESTING W REFLEX): HIV SCREEN 4TH GENERATION: NONREACTIVE

## 2018-03-17 LAB — CBC
HEMATOCRIT: 33.2 % — AB (ref 36.0–46.0)
Hemoglobin: 11.2 g/dL — ABNORMAL LOW (ref 12.0–15.0)
MCH: 31.4 pg (ref 26.0–34.0)
MCHC: 33.7 g/dL (ref 30.0–36.0)
MCV: 93 fL (ref 78.0–100.0)
PLATELETS: 243 10*3/uL (ref 150–400)
RBC: 3.57 MIL/uL — ABNORMAL LOW (ref 3.87–5.11)
RDW: 12.6 % (ref 11.5–15.5)
WBC: 6.8 10*3/uL (ref 4.0–10.5)

## 2018-03-17 LAB — RPR: RPR: NONREACTIVE

## 2018-03-17 LAB — GC/CHLAMYDIA PROBE AMP (~~LOC~~) NOT AT ARMC
Chlamydia: NEGATIVE
NEISSERIA GONORRHEA: NEGATIVE

## 2018-03-17 LAB — I-STAT BETA HCG BLOOD, ED (MC, WL, AP ONLY): I-stat hCG, quantitative: 51.9 m[IU]/mL — ABNORMAL HIGH (ref <5)

## 2018-03-17 LAB — LIPASE, BLOOD: LIPASE: 33 U/L (ref 11–51)

## 2018-03-17 MED ORDER — KETOROLAC TROMETHAMINE 60 MG/2ML IM SOLN
60.0000 mg | Freq: Once | INTRAMUSCULAR | Status: AC
  Administered 2018-03-17: 60 mg via INTRAMUSCULAR
  Filled 2018-03-17: qty 2

## 2018-03-17 MED ORDER — METOCLOPRAMIDE HCL 10 MG PO TABS
10.0000 mg | ORAL_TABLET | Freq: Once | ORAL | Status: AC
  Administered 2018-03-17: 10 mg via ORAL
  Filled 2018-03-17: qty 1

## 2018-03-17 MED ORDER — OMEPRAZOLE 20 MG PO CPDR: DELAYED_RELEASE_CAPSULE | ORAL | 0 refills | Status: DC

## 2018-03-17 NOTE — ED Notes (Signed)
Pt requested to speak to an Charity fundraiserN. Writer went to speak with her. She is concerned with her wait time and why she has seen people get seen before her. This RN explained acuity and that we have a minor care area and acute care area. I also let her know that she should be next to go as long as no one more acute comes in. She ambulated away from this RN with a steady gait.

## 2018-03-17 NOTE — ED Notes (Signed)
Unable to get labs on pt.  

## 2018-03-17 NOTE — Discharge Instructions (Addendum)
Use the list of clinics provided to find a primary care doctor who can take care of nonemergent health concerns, and recheck abdominal pain with use of Prilosec to insure it is improving.

## 2018-03-17 NOTE — ED Triage Notes (Signed)
Pt reports sharp epigastric pain x4 days. She also states that her chest feels tight and endorses a "little bit" of SOB. She reports that she had an abortion about 2 weeks ago.

## 2018-03-17 NOTE — ED Triage Notes (Signed)
Pt arrives with c/o upper abdominal pain and headaches for about 4 days. She says the pain in the abdomen hurts worse to push on the area. She denies n/v/d or urinary symptoms.

## 2018-03-17 NOTE — ED Provider Notes (Signed)
[name] [name] Provider Note   CSN: [id number] Arrival date & time: 03/17/18  1525     History   Chief Complaint Chief Complaint  Patient presents with  . Abdominal Pain    epigastric    HPI [name] [name] is a 27 y.o. female.  Patient with history of Cushing syndrome, DM, PCOS presents with epigastric abdominal pain x 4 days that is constant. No aggravating or alleviating factors, including eating/drinking, lying/standing. She has tried taking ibuprofen for symptoms without relief. She also complains of a right sided headache and periodic right sided chest discomfort. No SOB, cough, fever. No history of headaches. Ibuprofen gives temporary relief of headache but it returns. No nausea, vomiting, diarrhea. She states she is having mild constipation but moving her bowels regularly. No change in diet. Seen at [facility] yesterday for these symptoms as well as vaginal discharge, but reports only her vaginal symptoms were addressed.   The history is provided by the patient. No language interpreter was used.  Abdominal Pain   Associated symptoms include headaches. Pertinent negatives include fever, nausea and vomiting.    Past Medical History:  Diagnosis Date  . 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 Diabetes mellitus    Type II  . 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 Gonorrhea   . Hirsutism   . PCOS (polycystic ovarian syndrome)     Patient Active Problem List   Diagnosis Date Noted  . Uncontrolled diabetes mellitus ([place]) 06/04/2013  . Cushings syndrome ([place]) 01/08/2013  . PCOS (polycystic ovarian syndrome) 01/08/2013    Past Surgical History:  Procedure Laterality Date  . ABSCESS DRAINAGE    . THERAPEUTIC ABORTION    . TOOTH EXTRACTION       OB History    Gravida  3   Para  1   Term  1   Preterm  0   AB  2   Living  1     SAB  1   TAB  1   Ectopic  0   Multiple  0   Live Births  1            Home  Medications    Prior to Admission medications   Medication Sig Start Date End Date Taking? Authorizing Provider  Blood Glucose Monitoring Suppl (TRUE METRIX METER) w/Device KIT Use as directed 07/28/16   Tresa Garter, MD  glipiZIDE (GLUCOTROL) 5 MG tablet Take 1 tablet (5 mg total) by mouth daily before breakfast. Patient not taking: Reported on 02/28/2018 03/11/17   Alfonse Spruce, FNP  glucose blood (TRUE METRIX BLOOD GLUCOSE TEST) test strip Use as instructed 03/11/17   Alfonse Spruce, FNP  insulin glargine (LANTUS) 100 UNIT/ML injection Inject 0.15 mLs (15 Units total) into the skin at bedtime. Patient not taking: Reported on 02/28/2018 01/16/18   Varney Biles, MD  metFORMIN (GLUCOPHAGE) 500 MG tablet Take 1 tablet (500 mg total) by mouth 2 (two) times daily with a meal. 01/16/18   Varney Biles, MD  metroNIDAZOLE (FLAGYL) 500 MG tablet Take 1 tablet (500 mg total) by mouth 2 (two) times daily. 03/16/18   Luvenia Redden, PA-C  promethazine (PHENERGAN) 25 MG tablet Take 0.5-1 tablets (12.5-25 mg total) by mouth every 6 (six) hours as needed. Patient not taking: Reported on 02/28/2018 02/24/18   Tresea Mall, CNM    Family History Family History  Problem Relation Age of Onset  . Epilepsy Father   . Cancer  Father        lung cancer  . COPD Maternal Grandmother   . Hypertension Maternal Grandmother   . Heart disease Maternal Grandmother   . Cancer Paternal Grandfather     Social History Social History   Tobacco Use  . Smoking status: Current Every Day Smoker    Packs/day: 0.25    Types: Cigarettes  . Smokeless tobacco: Never Used  Substance Use Topics  . Alcohol use: Not Currently    Alcohol/week: 8.0 - 10.0 standard drinks    Types: 3 - 4 Cans of beer, 5 - 6 Shots of liquor per week    Comment: socially  . Drug use: No     Allergies   Lisinopril   Review of Systems Review of Systems  Constitutional: Negative for chills and fever.  HENT: Negative.    Respiratory: Negative.   Cardiovascular: Positive for chest pain (See HPI.).  Gastrointestinal: Positive for abdominal pain. Negative for nausea and vomiting.  Musculoskeletal: Negative.   Skin: Negative.   Neurological: Positive for headaches.     Physical Exam Updated Vital Signs BP (!) 146/90 (BP Location: Left Arm)   Pulse 80   Temp 99.4 F (37.4 C) (Oral)   Resp 18   LMP 01/02/2018   SpO2 100%   Physical Exam  Constitutional: She appears well-developed and well-nourished.  HENT:  Head: Normocephalic.  Neck: Normal range of motion. Neck supple.    Mildly tender to right occipital scalp.  Cardiovascular: Normal rate and regular rhythm.  No murmur heard. Pulmonary/Chest: Effort normal and breath sounds normal. She has no wheezes. She has no rhonchi. She has no rales. She exhibits no tenderness.  Abdominal: Soft. Bowel sounds are normal. There is tenderness (No RUQ tenderness) in the epigastric area. There is no rebound and no guarding.  Musculoskeletal: Normal range of motion.  Neurological: She is alert. No cranial nerve deficit.  CN's 3-12 grossly intact. Speech is clear and focused. No facial asymmetry. No lateralizing weakness. Reflexes are equal. No deficits of coordination. Ambulatory without imbalance.    Skin: Skin is warm and dry. No rash noted.  Psychiatric: She has a normal mood and affect.     ED Treatments / Results  Labs (all labs ordered are listed, but only abnormal results are displayed) Labs Reviewed  COMPREHENSIVE METABOLIC PANEL - Abnormal; Notable for the following components:      Result Value   Glucose, Bld 228 (*)    Calcium 8.7 (*)    AST 13 (*)    All other components within normal limits  CBC - Abnormal; Notable for the following components:   RBC 3.57 (*)    Hemoglobin 11.2 (*)    HCT 33.2 (*)    All other components within normal limits  I-STAT BETA HCG BLOOD, ED ([facility], [facility], [facility] ONLY) - Abnormal; Notable for the following  components:   I-stat hCG, quantitative 51.9 (*)    All other components within normal limits  LIPASE, BLOOD  URINALYSIS, ROUTINE W REFLEX MICROSCOPIC    EKG None  Radiology No results found.  Procedures Procedures (including critical care time)  Medications Ordered in ED Medications - No data to display   Initial Impression / Assessment and Plan / ED Course  I have reviewed the triage vital signs and the nursing notes.  Pertinent labs & imaging results that were available during my care of the patient were reviewed by me and considered in my medical decision making (see chart for  details).     Patient is here for evaluation of epigastric abdominal pain associated with right sided headache and periodic right chest pain for 4 days. No other GI symptoms, fever, SOB, cough. Ibuprofen without relief.   The patient is very well appearing. VSS. Labs reviewed from visit to [facility] yesterday and compared to labs today and are both without change and without any concerning abnormality.   DDx of epigastric pain includes dyspepsia vs PUD. Do not feel gall bladder in involved without association with eating and no RUQ pain. No concern for infection.  Reglan and Toradol given in ED to address likely tension type headache and epigastric pain. Will start on omeprazole and refer to GI, although she reports being unable to establish with a primary care provider secondary to financial burden so follow up with specialist at this point is likely not something she will be able to do. Will provide resources for clinics available to her and encourage follow up there.   Final Clinical Impressions(s) / ED Diagnoses   Final diagnoses:  None   1. Epigastric abdominal pain 2. Tension type headache.  ED Discharge Orders    None       Dennie Bible 03/17/18 [place], MD 03/18/18 [phone]

## 2018-03-21 ENCOUNTER — Encounter (HOSPITAL_COMMUNITY): Payer: Self-pay | Admitting: Emergency Medicine

## 2018-03-21 ENCOUNTER — Emergency Department (HOSPITAL_COMMUNITY)
Admission: EM | Admit: 2018-03-21 | Discharge: 2018-03-21 | Discharge status: Against Medical Advice | Payer: Medicaid Other | Attending: Emergency Medicine | Admitting: Emergency Medicine

## 2018-03-21 DIAGNOSIS — R51 Headache: Secondary | ICD-10-CM | POA: Insufficient documentation

## 2018-03-21 DIAGNOSIS — Z5321 Procedure and treatment not carried out due to patient leaving prior to being seen by health care provider: Secondary | ICD-10-CM | POA: Insufficient documentation

## 2018-03-21 MED ORDER — IBUPROFEN 400 MG PO TABS
400.0000 mg | ORAL_TABLET | Freq: Once | ORAL | Status: AC
  Administered 2018-03-21: 400 mg via ORAL
  Filled 2018-03-21: qty 1

## 2018-03-21 NOTE — ED Notes (Signed)
No answer vitals 1x

## 2018-03-21 NOTE — ED Triage Notes (Signed)
Patient presents to ED for assessment of headache x 6 days, right sided with some blurred vision to he right eye.  C/o right sided stiff neck as well.  BP 162/94 en route with EMS, states no hx of HTN.

## 2018-03-21 NOTE — ED Notes (Addendum)
No answer for vitals x 3  

## 2018-04-04 ENCOUNTER — Ambulatory Visit: Payer: Self-pay | Admitting: Endocrinology

## 2018-04-04 DIAGNOSIS — Z0289 Encounter for other administrative examinations: Secondary | ICD-10-CM

## 2018-06-30 ENCOUNTER — Other Ambulatory Visit (HOSPITAL_COMMUNITY)
Admission: RE | Admit: 2018-06-30 | Discharge: 2018-06-30 | Disposition: A | Discharge status: Home / Self Care | Payer: Medicaid Other | Source: Ambulatory Visit | Attending: Student | Admitting: Student

## 2018-06-30 ENCOUNTER — Ambulatory Visit (INDEPENDENT_AMBULATORY_CARE_PROVIDER_SITE_OTHER): Payer: Medicaid Other | Admitting: Student

## 2018-06-30 ENCOUNTER — Encounter: Payer: Self-pay | Admitting: Student

## 2018-06-30 VITALS — BP 131/88 | HR 74 | Ht 66.0 in | Wt 271.9 lb

## 2018-06-30 DIAGNOSIS — Z113 Encounter for screening for infections with a predominantly sexual mode of transmission: Secondary | ICD-10-CM | POA: Insufficient documentation

## 2018-06-30 DIAGNOSIS — N760 Acute vaginitis: Secondary | ICD-10-CM

## 2018-06-30 DIAGNOSIS — Z0000 Encounter for general adult medical examination without abnormal findings: Secondary | ICD-10-CM

## 2018-06-30 DIAGNOSIS — B9689 Other specified bacterial agents as the cause of diseases classified elsewhere: Secondary | ICD-10-CM

## 2018-06-30 DIAGNOSIS — Z3009 Encounter for other general counseling and advice on contraception: Secondary | ICD-10-CM

## 2018-06-30 LAB — POCT PREGNANCY, URINE: PREG TEST UR: NEGATIVE

## 2018-06-30 NOTE — Progress Notes (Signed)
GYNECOLOGY CLINIC ANNUAL PREVENTATIVE CARE ENCOUNTER NOTE  Subjective:   [name] [name] is a 27 y.o. [phone] female here for a routine annual gynecologic exam. LMP 06/29/18. She is sexually active with 1 partner since March & routinely uses condoms. Would like STI screening during today's visit.   Has been taking sprintec irregularly since a TAB in September. Wants something different for contraception but unsure what. Hx of T2DM (on insulin & metformin), HTN, & cushings. Is currently between PCPs d/t insurance reasons but does have an endocrinologist whom she last saw in September   Gynecologic History Patient's last menstrual period was 06/29/2018. Contraception: condoms Last Pap: 06/2015. Results were: normal  Obstetric History OB History  Gravida Para Term Preterm AB Living  3 1 1  0 2 1  SAB TAB Ectopic Multiple Live Births  1 1 0 0 1    # Outcome Date GA Lbr Len/2nd Weight Sex Delivery Anes PTL Lv  3 TAB 03/04/18 [redacted]w[redacted]d        2 Term 07/20/13 360w2d 03:27 9 lb 9.1 oz (4.34 kg) F Vag-Spont EPI  LIV     Birth Comments: 9 lbs. 9 oz. Infant born at 3946 weeksestational age to a 2324ear old g 2 p 0 0 1 0 female. Gestation was complicated by in a controlled  class B. insulin dependent diabetes mellitus, polycystic ovary syndrome, Cushing syndrome Mother received General anesthesia primary cesarean section due to  loss of heart rate prior to delivery and thick meconium Nursery Course was complicated by severe hypoxic ischemic insult at birth requiring resuscitation,  treatment with therapeutic hypothermia. Growth and Development was recalled as  mild truncal hypotonia according to physical therapy.  1 SAB 09/2012 [id number]         Birth Comments: System Generated. Please review and update pregnancy details.    Past Medical History:  Diagnosis Date  . Cushing syndrome ([place] . Diabetes mellitus    Type II  . Gonorrhea   . Hirsutism   . PCOS (polycystic ovarian syndrome)      Past Surgical History:  Procedure Laterality Date  . ABSCESS DRAINAGE    . THERAPEUTIC ABORTION    . TOOTH EXTRACTION      Current Outpatient Medications on File Prior to Visit  Medication Sig Dispense Refill  . Blood Glucose Monitoring Suppl (TRUE METRIX METER) w/Device KIT Use as directed 1 kit 0  . glucose blood (TRUE METRIX BLOOD GLUCOSE TEST) test strip Use as instructed 100 each 12  . insulin glargine (LANTUS) 100 UNIT/ML injection Inject 0.15 mLs (15 Units total) into the skin at bedtime. 10 mL 1  . metFORMIN (GLUCOPHAGE) 500 MG tablet Take 1 tablet (500 mg total) by mouth 2 (two) times daily with a meal. 60 tablet 1  . glipiZIDE (GLUCOTROL) 5 MG tablet Take 1 tablet (5 mg total) by mouth daily before breakfast. (Patient not taking: Reported on 02/28/2018) 30 tablet 2   No current facility-administered medications on file prior to visit.     Allergies  Allergen Reactions  . Lisinopril Other (See Comments) and Shortness Of Breath    Chest pains Chest pains and tightness      Family History  Problem Relation Age of Onset  . Epilepsy Father   . Cancer Father        lung cancer  . COPD Maternal Grandmother   . Hypertension Maternal Grandmother   . Heart disease Maternal Grandmother   .  Cancer Paternal Grandfather     The following portions of the patient's history were reviewed and updated as appropriate: allergies, current medications, past family history, past medical history, past social history, past surgical history and problem list.  Review of Systems Pertinent items are noted in HPI.   Objective:  BP 131/88   Pulse 74   Ht 5' 6"  (1.676 m)   Wt 271 lb 14.4 oz (123.3 kg)   LMP 06/29/2018   BMI 43.89 kg/m  CONSTITUTIONAL: Well-developed, well-nourished female in no acute distress.  HENT:  Normocephalic, atraumatic, External right and left ear normal.  EYES: Conjunctivae and EOM are normal. Pupils are equal, round, and reactive to light. No scleral  icterus.  NECK: Normal range of motion, supple, no masses.  Normal thyroid.  SKIN: Skin is warm and dry. No rash noted. Not diaphoretic. No erythema. No pallor. [place]: Alert and oriented to person, place, and time. Normal reflexes, muscle tone coordination. No cranial nerve deficit noted. CARDIOVASCULAR: Normal heart rate noted, regular rhythm RESPIRATORY: Clear to auscultation bilaterally. Effort and breath sounds normal, no problems with respiration noted. PELVIC: Normal appearing external genitalia; normal appearing vaginal mucosa and cervix. Moderate amount of active dark red bleeding from cervical os appropriate for menses. Pap smear not obtained at this time  Assessment:  Pap smear not obtained at this time d/t amount of vaginal bleeding.   Discussed contraception options. Will avoid estrogen containing pills & depo provera d/t increased risks (obesity, HTN, DM, & current smoker). Pt unsure. Will give list of contraception choices to review at home.    Plan:  1. Well woman exam without gynecological exam -will return in 2 weeks for pap when no longer bleeding -discussed importance of healthcare in presence of comorbidities (HTN & DM). Will give list of [facility] resources to establish care with a new PCP  2. Screen for STD (sexually transmitted disease)  - RPR - HIV Antibody (routine testing w rflx) - Cervicovaginal ancillary only( [place])  3. Encounter for other general counseling or advice on contraception -discussed contraception options. Given list to review at home. Will decide when returns for next visit    Jorje Guild, NP

## 2018-06-30 NOTE — Patient Instructions (Addendum)
Contraception Choices Contraception, also called birth control, refers to methods or devices that prevent pregnancy. Hormonal methods Contraceptive implant  A contraceptive implant is a thin, plastic tube that contains a hormone. It is inserted into the upper part of the arm. It can remain in place for up to 3 years. Progestin-only injections Progestin-only injections are injections of progestin, a synthetic form of the hormone progesterone. They are given every 3 months by a health care provider. Birth control pills  Birth control pills are pills that contain hormones that prevent pregnancy. They must be taken once a day, preferably at the same time each day. Birth control patch  The birth control patch contains hormones that prevent pregnancy. It is placed on the skin and must be changed once a week for three weeks and removed on the fourth week. A prescription is needed to use this method of contraception. Vaginal ring  A vaginal ring contains hormones that prevent pregnancy. It is placed in the vagina for three weeks and removed on the fourth week. After that, the process is repeated with a new ring. A prescription is needed to use this method of contraception. Emergency contraceptive Emergency contraceptives prevent pregnancy after unprotected sex. They come in pill form and can be taken up to 5 days after sex. They work best the sooner they are taken after having sex. Most emergency contraceptives are available without a prescription. This method should not be used as your only form of birth control. Barrier methods Female condom  A female condom is a thin sheath that is worn over the penis during sex. Condoms keep sperm from going inside a woman's body. They can be used with a spermicide to increase their effectiveness. They should be disposed after a single use. Female condom  A female condom is a soft, loose-fitting sheath that is put into the vagina before sex. The condom keeps sperm  from going inside a woman's body. They should be disposed after a single use. Diaphragm  A diaphragm is a soft, dome-shaped barrier. It is inserted into the vagina before sex, along with a spermicide. The diaphragm blocks sperm from entering the uterus, and the spermicide kills sperm. A diaphragm should be left in the vagina for 6-8 hours after sex and removed within 24 hours. A diaphragm is prescribed and fitted by a health care provider. A diaphragm should be replaced every 1-2 years, after giving birth, after gaining more than 15 lb (6.8 kg), and after pelvic surgery. Cervical cap  A cervical cap is a round, soft latex or plastic cup that fits over the cervix. It is inserted into the vagina before sex, along with spermicide. It blocks sperm from entering the uterus. The cap should be left in place for 6-8 hours after sex and removed within 48 hours. A cervical cap must be prescribed and fitted by a health care provider. It should be replaced every 2 years. Sponge  A sponge is a soft, circular piece of polyurethane foam with spermicide on it. The sponge helps block sperm from entering the uterus, and the spermicide kills sperm. To use it, you make it wet and then insert it into the vagina. It should be inserted before sex, left in for at least 6 hours after sex, and removed and thrown away within 30 hours. Spermicides Spermicides are chemicals that kill or block sperm from entering the cervix and uterus. They can come as a cream, jelly, suppository, foam, or tablet. A spermicide should be inserted into the   vagina with an applicator at least 10-15 minutes before sex to allow time for it to work. The process must be repeated every time you have sex. Spermicides do not require a prescription. Intrauterine contraception Intrauterine device (IUD) An IUD is a T-shaped device that is put in a woman's uterus. There are two types:  Hormone IUD.This type contains progestin, a synthetic form of the hormone  progesterone. This type can stay in place for 3-5 years.  Copper IUD.This type is wrapped in copper wire. It can stay in place for 10 years.  Permanent methods of contraception Female tubal ligation In this method, a woman's fallopian tubes are sealed, tied, or blocked during surgery to prevent eggs from traveling to the uterus. Hysteroscopic sterilization In this method, a small, flexible insert is placed into each fallopian tube. The inserts cause scar tissue to form in the fallopian tubes and block them, so sperm cannot reach an egg. The procedure takes about 3 months to be effective. Another form of birth control must be used during those 3 months. Female sterilization This is a procedure to tie off the tubes that carry sperm (vasectomy). After the procedure, the man can still ejaculate fluid (semen). Natural planning methods Natural family planning In this method, a couple does not have sex on days when the woman could become pregnant. Calendar method This means keeping track of the length of each menstrual cycle, identifying the days when pregnancy can happen, and not having sex on those days. Ovulation method In this method, a couple avoids sex during ovulation. Symptothermal method This method involves not having sex during ovulation. The woman typically checks for ovulation by watching changes in her temperature and in the consistency of cervical mucus. Post-ovulation method In this method, a couple waits to have sex until after ovulation. Summary  Contraception, also called birth control, means methods or devices that prevent pregnancy.  Hormonal methods of contraception include implants, injections, pills, patches, vaginal rings, and emergency contraceptives.  Barrier methods of contraception can include female condoms, female condoms, diaphragms, cervical caps, sponges, and spermicides.  There are two types of IUDs (intrauterine devices). An IUD can be put in a woman's uterus to  prevent pregnancy for 3-5 years.  Permanent sterilization can be done through a procedure for males, females, or both.  Natural family planning methods involve not having sex on days when the woman could become pregnant. This information is not intended to replace advice given to you by your health care provider. Make sure you discuss any questions you have with your health care provider. Document Released: 06/21/2005 Document Revised: 06/23/2017 Document Reviewed: 07/24/2016 Elsevier Interactive Patient Education  2019 [employer]    [facility] Resource Guide (Revised August 2014)    Insufficient Money for Medicine:           [address]: call "211"   . MAP Program at [facility] - [place] [phone] or [place] [phone]            No Primary Care Doctor:  To locate a primary care doctor that accepts your insurance or provides certain services:           [place] [facility]: [phone]           Physician Referral Service: [phone] ask for "My [place]" . If no insurance, you need to see if you qualify for [facility] "orange card", call to set      up appointment for eligibility/enrollment at [phone] or [phone] or visit  [facility]. of Health and [employer] ([address], [place] and [address] -[place]) to meet with a [facility] enrollment specialist.  Agencies that provide inexpensive (sliding fee scale) medical care:   Marland Kitchen    [facility] - Family Medicine at [facility] .    [facility]  -  [facility] [phone] .    [facility] - [phone] .    [facility] [phone] .    [facility] [phone] .    [facility] Family [facility] [phone] . [facility] - [facility] @ [place] [phone]-     [phone] . [facility] - [facility] @ [place] [phone] . [facility]: [phone]  . Women's Clinic: [phone]  . Planned Parenthood: [phone]  . Family Services of the [place]    Medicaid-accepting [facility] Providers:           [facility] - [phone] (No Family Planning accepted)          [address], [phone], Mon-Fri 9am-5pm          [facility] [phone] . [address] [place], [address], [place] 8am-5pm, Fri 8am-noon . [facility] [place] - [phone]          [address], Mon-Fri 7:30am-4:30pm          [facility] Family [facility] - [phone]          [address], [place] 8am-5pm          [facility] - [phone] [address]          Only accepts [place] [employer] patients after they have their name applied to their card  Self Pay (no insurance) in [facility]:           Sickle Cell Patients:  . [address] [place], [phone] [facility] Internal Medicine: . [address], [place] [phone]       [facility] Wellness . [address] [place] [phone]  [facility] Family Practice: . [address], [phone]          [facility]           [address] [place], [phone] [facility] for [facility] . [address] [place], [phone]           [facility] [place]           [address], [place] [phone]        [facility] - [address]           [phone], Mon-Fri 9am-7pm, [place] 9am-1pm          [facility] - Family Medicine @ [facility]          [address] [place], [phone]          [facility] - [facility]           [address] [phone] [facility] -  [facility] [place] . [address] [place] [phone]          [facility]           [address], [phone]  [facility] - [facility]  . [address] [place], [phone] [facility] - [facility] . [address] [phone]  Dr. Julio Sicks           [address], [place], [phone]          [facility]           [address], [phone]          [facility]             [address], [phone]          [facility]           [address] [place], [phone], 1st & 3rd Saturday every month, 10am-1pm OTHERS:  [employer]  (Immigration [employer] Only)  [phone] (Thursday only) Strategies for finding a Primary Care Provider:  1) Find a Doctor and Pay Out of Pocket  Although you won't have to find out who is covered by your insurance plan, it is a good idea to ask around and get recommendations. You will then need to call the office and see if the doctor you have chosen will accept you as a new patient and what types of options they offer for patients who are self-pay. Some doctors offer discounts or will set up payment plans for their patients who do not have insurance, but you will need to ask so you aren't surprised when you get to your appointment.  2) Contact [place] [employer] - To see if you qualify for "orange card" access to healthcare safety net providers.  Call for appointment for eligibility/enrollment at [phone] or [phone]. (Uninsured, 0-200% FPL, qualifying info)  Applicants for [facility] are first required to see if they are eligible to enroll in the [facility] Marketplace before enrolling in [facility] (and get an exemption if they are not).  GCCN Criteria for acceptance is:  ? Proof of [employer] Marketing exemption - form or documentation  ? Valid photo ID (driver's license, state identification card, passport, home country  ID)  ? Proof of [facility] residency (e.g. driver's license, lease/landlord information, pay stubs with address, utility bill, bank statement, etc.)  ? Proof of income (1040, last year's tax return, W2, 4 current pay stubs, other income proof)  ? Proof of assets (current bank statement + 3 most recent, disability paperwork, life insurance info, tax value on autos, etc.)  3) Contact Your Local Health Department  Not all health departments have doctors that can see patients for sick visits, but many do, so it is worth a call to see if yours does. If you don't know where your local health department is, you can check in your phone book. The CDC also has a tool to help you locate your state's health department, and many state websites also have listings of all of their local health departments.  4) Find a Walk-in Clinic  If your illness is not likely to be very severe or complicated, you may want to try a walk in clinic. These are popping up all over the country in pharmacies, drugstores, and shopping centers. They're usually staffed by  nurse practitioners or physician assistants that have been trained to treat common illnesses and complaints. They're usually fairly quick and inexpensive. However, if you have serious medical issues or chronic medical problems, these are probably not your best option   STD Testing:           [facility]           [address], [place], phone [phone] or [phone]           Monday - Friday, call for an appointment          [facility]           [address], [place], phone [phone] or [phone]           Monday - Friday, call for an appointment Abuse/Neglect:           [employer] Child Abuse Hotline: 626-696-1655           [facility] Child Abuse Hotline: 662-150-5524 (After Hours) Emergency Shelter:  [facility]  Ministries [phone]  [facility] [place]- [phone]  [facility] [place] - [phone]  Youth Focus - Act Together - [phone] (ages 60-17)  Homeless Day Shelter @ [employer] - 619-245-2885   Mammograms - Free at [facility] [phone]  Maternity Homes:           Room at the [place] of the [facility]: [phone]   (Homeless mother with children)          [facility] Alert Services: [phone] (Mothers only) . Youth Focus: 939-684-7187 (Pregnant 31-55 years old) . Adopt a Mom -([phone]  [facility]  . [facility] - Lanae Boast . [address], [address] [phone]          [facility] [place]           [address]           [phone]          [place]           [address], [place]           [phone]          [facility] Dept.           [address], [place]           [phone]          [facility]           [phone]          [facility]           [phone]          [facility] in [place]           [address]           4788111259, [facility] Child Abuse Hotline           (905)863-7966           [phone] (After Hours)  Behavioral Health Services /Substance Abuse Resources:           Alcohol and Drug Services: 412-851-4365  [facility]: [phone]          [facility]: [phone]  . Narcotics Helpline (320)796-3487- [phone]          [facility]: 567-215-3303(336) [phone]           Residential & Outpatient Substance Abuse Program - [facility]: [phone] . [facility] - [phone] Psychological Services:          [facility]: [phone]  . Therapeutic Alternatives: [phone]           [facility]           [address], [place]           ACCESS LINE: [phone]     (24 Hour) . Mobile Crisis:  . HELPLINES:  [facility]ness - [place] [phone]) [phone] [facility] - [place] [phone](800) [phone] . Walk In [facility]       [facility] - [address] - [place]  [phone]       [facility] - [phone] or [phone]  [facility] - [phone] S. [address] - [place] [phone]  [facility] [phone]- [address], [place] [phone]   Dental Assistance:  If unable to pay or uninsured, contact: [facility] to become qualified for the adult dental clinic. Patient must be enrolled in [facility] (uninsured, 0-200% FPL, qualifying info).  Enroll in [facility] first, then see Primary Care Physician assigned to you, the PCP makes a dental referral. [facility] will receive referral and contacts patient for appointment.  Patients with Medicaid           [address], [phone]  [facility] (Children up to 20 + Pregnant Women) - [phone]) [phone]  [facility] - [address] - [address] [phone]  If unable to pay, or uninsured: contact [facility] [phone] in [place] - (Children only + Pregnant Women), [phone] in [facility]- Children only) to become qualified for the adult dental clinic  Must see if eligible to enroll in [facility] Insurance Marketplace before enrolling into the [facility] (exemption required) [phone] for an appointment)  [email or web address].ukwww.[email or web address];   [phone].  If not eligible for ACA, then go by Department of Health and Human Services to see if eligible for "orange card."  [address], [place] and [phone]- [address] [place].  Once you get an orange card, you will have a Primary Care home who will then refer you to dental if  needed.     Other IT consultantLow-Cost Community Dental Services:   [facility] - [phone] (ext [phone])   [address] [place] [address]  Dr.  Marseillesivils - [phone]   [address]    [employer] - [phone]   [address] [facility] [address]           [facility]           [address], [place], [place], [address]           [phone], Ext. [phone]           2nd and 4th Thursday of the month at 6:30am (Simple extractions only - no wisdom teeth or surgery) First come/First serve -First 10 clients served           [facility] [place], [place] and [place] residents  only)          [address] [place], [place], [address]           [phone]                    [facility] Health Department           [phone]          [facility] Health Department          [phone]         [facility] - [facility]          [phone]   Transportation Options:  Ambulance - 911 - [phone] per ride Family Member to accompany patient (if stable) [place] [employer] - [phone]  PART - [phone]  Taxi - [phone] - [place]  SCAT - [phone] (Application required)  [facility] - [phone]

## 2018-06-30 NOTE — Progress Notes (Signed)
Recommended pt schedule appointment with Integrated behavioral health for elevated PHQ-9 and GAD-7

## 2018-07-01 LAB — HIV ANTIBODY (ROUTINE TESTING W REFLEX): HIV Screen 4th Generation wRfx: NONREACTIVE

## 2018-07-01 LAB — RPR: RPR Ser Ql: NONREACTIVE

## 2018-07-03 LAB — CERVICOVAGINAL ANCILLARY ONLY
Bacterial vaginitis: POSITIVE — AB
CANDIDA VAGINITIS: NEGATIVE
Chlamydia: NEGATIVE
Neisseria Gonorrhea: NEGATIVE
Trichomonas: NEGATIVE

## 2018-07-06 ENCOUNTER — Encounter (HOSPITAL_COMMUNITY): Payer: Self-pay | Admitting: Emergency Medicine

## 2018-07-06 ENCOUNTER — Emergency Department (HOSPITAL_COMMUNITY)
Admission: EM | Admit: 2018-07-06 | Discharge: 2018-07-06 | Disposition: A | Discharge status: Home / Self Care | Payer: Medicaid Other | Attending: Emergency Medicine | Admitting: Emergency Medicine

## 2018-07-06 DIAGNOSIS — Z794 Long term (current) use of insulin: Secondary | ICD-10-CM | POA: Insufficient documentation

## 2018-07-06 DIAGNOSIS — F17210 Nicotine dependence, cigarettes, uncomplicated: Secondary | ICD-10-CM | POA: Insufficient documentation

## 2018-07-06 DIAGNOSIS — Z79899 Other long term (current) drug therapy: Secondary | ICD-10-CM | POA: Insufficient documentation

## 2018-07-06 DIAGNOSIS — N611 Abscess of the breast and nipple: Secondary | ICD-10-CM | POA: Insufficient documentation

## 2018-07-06 DIAGNOSIS — E119 Type 2 diabetes mellitus without complications: Secondary | ICD-10-CM | POA: Insufficient documentation

## 2018-07-06 LAB — CBC WITH DIFFERENTIAL/PLATELET
ABS IMMATURE GRANULOCYTES: 0.04 10*3/uL (ref 0.00–0.07)
BASOS PCT: 0 %
Basophils Absolute: 0 10*3/uL (ref 0.0–0.1)
Eosinophils Absolute: 0.1 10*3/uL (ref 0.0–0.5)
Eosinophils Relative: 1 %
HEMATOCRIT: 37.5 % (ref 36.0–46.0)
Hemoglobin: 12.3 g/dL (ref 12.0–15.0)
IMMATURE GRANULOCYTES: 1 %
LYMPHS ABS: 1.6 10*3/uL (ref 0.7–4.0)
Lymphocytes Relative: 21 %
MCH: 31 pg (ref 26.0–34.0)
MCHC: 32.8 g/dL (ref 30.0–36.0)
MCV: 94.5 fL (ref 80.0–100.0)
MONOS PCT: 8 %
Monocytes Absolute: 0.6 10*3/uL (ref 0.1–1.0)
NEUTROS ABS: 5.5 10*3/uL (ref 1.7–7.7)
NEUTROS PCT: 69 %
PLATELETS: 185 10*3/uL (ref 150–400)
RBC: 3.97 MIL/uL (ref 3.87–5.11)
RDW: 11.9 % (ref 11.5–15.5)
WBC: 7.9 10*3/uL (ref 4.0–10.5)
nRBC: 0 % (ref 0.0–0.2)

## 2018-07-06 LAB — I-STAT CHEM 8, ED
BUN: 8 mg/dL (ref 6–20)
CHLORIDE: 102 mmol/L (ref 98–111)
Calcium, Ion: 1.17 mmol/L (ref 1.15–1.40)
Creatinine, Ser: 0.5 mg/dL (ref 0.44–1.00)
Glucose, Bld: 279 mg/dL — ABNORMAL HIGH (ref 70–99)
HEMATOCRIT: 37 % (ref 36.0–46.0)
Hemoglobin: 12.6 g/dL (ref 12.0–15.0)
POTASSIUM: 4.2 mmol/L (ref 3.5–5.1)
Sodium: 139 mmol/L (ref 135–145)
TCO2: 30 mmol/L (ref 22–32)

## 2018-07-06 LAB — I-STAT BETA HCG BLOOD, ED (MC, WL, AP ONLY): HCG, QUANTITATIVE: 10.8 m[IU]/mL — AB (ref <5)

## 2018-07-06 MED ORDER — SULFAMETHOXAZOLE-TRIMETHOPRIM 800-160 MG PO TABS: 1.0000 | ORAL_TABLET | Freq: Two times a day (BID) | ORAL | 0 refills | Status: DC

## 2018-07-06 MED ORDER — LIDOCAINE-EPINEPHRINE (PF) 2 %-1:200000 IJ SOLN
10.0000 mL | Freq: Once | INTRAMUSCULAR | Status: AC
  Administered 2018-07-06: 10 mL
  Filled 2018-07-06: qty 10

## 2018-07-06 MED ORDER — CEPHALEXIN 500 MG PO CAPS: 500.0000 mg | ORAL_CAPSULE | Freq: Four times a day (QID) | ORAL | 0 refills | Status: DC

## 2018-07-06 MED ORDER — METRONIDAZOLE 500 MG PO TABS: 500.0000 mg | ORAL_TABLET | Freq: Two times a day (BID) | ORAL | 0 refills | Status: DC

## 2018-07-06 MED ORDER — FENTANYL CITRATE (PF) 100 MCG/2ML IJ SOLN
50.0000 ug | Freq: Once | INTRAMUSCULAR | Status: AC
  Administered 2018-07-06: 50 ug via INTRAVENOUS
  Filled 2018-07-06 (×2): qty 2

## 2018-07-06 NOTE — Addendum Note (Signed)
Addended by: Judeth Horn B on: 07/06/2018 03:31 PM   Modules accepted: Orders

## 2018-07-06 NOTE — ED Triage Notes (Signed)
Pt c/o left breast boil for several days that is painful but denies drainage.

## 2018-07-06 NOTE — Discharge Instructions (Addendum)
Your pregnancy test was just slightly above normal today.  Does not mean you are necessarily pregnant but cannot rule it out at this point.  Follow-up with your OB/GYN.

## 2018-07-06 NOTE — ED Notes (Signed)
RN applied gauze dressing with paper tape for drainage.

## 2018-07-06 NOTE — ED Provider Notes (Signed)
[facility] DEPT Provider Note   CSN: [id number] Arrival date & time: 07/06/18  0740     History   Chief Complaint Chief Complaint  Patient presents with  . Recurrent Skin Infections    HPI [name] [name] is a 28 y.o. female.  HPI Patient presents with left-sided breast abscess.  Has had for the last few days.  Has had previous abscesses.  No fevers.  Has been on some amoxicillin that she had from a previous family member.  She is diabetic.  States her sugars have been doing pretty well however.  Patient states she had a negative pregnancy test on Friday. Past Medical History:  Diagnosis Date  . 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 Diabetes mellitus    Type II  . Gonorrhea   . Hirsutism   . PCOS (polycystic ovarian syndrome)     Patient Active Problem List   Diagnosis Date Noted  . Uncontrolled diabetes mellitus ([place]) 06/04/2013  . 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 PCOS (polycystic ovarian syndrome) 01/08/2013    Past Surgical History:  Procedure Laterality Date  . ABSCESS DRAINAGE    . THERAPEUTIC ABORTION    . TOOTH EXTRACTION       OB History    Gravida  3   Para  1   Term  1   Preterm  0   AB  2   Living  1     SAB  1   TAB  1   Ectopic  0   Multiple  0   Live Births  1            Home Medications    Prior to Admission medications   Medication Sig Start Date End Date Taking? Authorizing Provider  insulin glargine (LANTUS) 100 UNIT/ML injection Inject 0.15 mLs (15 Units total) into the skin at bedtime. 01/16/18  Yes Varney Biles, MD  metFORMIN (GLUCOPHAGE) 500 MG tablet Take 1 tablet (500 mg total) by mouth 2 (two) times daily with a meal. 01/16/18  Yes Nanavati, Ankit, MD  Blood Glucose Monitoring Suppl (TRUE METRIX METER) w/Device KIT Use as directed 07/28/16   Tresa Garter, MD  cephALEXin (KEFLEX) 500 MG capsule Take 1 capsule (500 mg total) by mouth 4 (four) times daily. 07/06/18   Davonna Belling, MD  glipiZIDE (GLUCOTROL) 5 MG tablet Take 1 tablet (5 mg total) by mouth daily before breakfast. Patient not taking: Reported on 02/28/2018 03/11/17   Alfonse Spruce, FNP  glucose blood (TRUE METRIX BLOOD GLUCOSE TEST) test strip Use as instructed 03/11/17   Alfonse Spruce, FNP  metroNIDAZOLE (FLAGYL) 500 MG tablet Take 1 tablet (500 mg total) by mouth 2 (two) times daily. 07/06/18   Jorje Guild, NP  sulfamethoxazole-trimethoprim (BACTRIM DS,SEPTRA DS) 800-160 MG tablet Take 1 tablet by mouth 2 (two) times daily for 7 days. 07/06/18 07/13/18  Davonna Belling, MD    Family History Family History  Problem Relation Age of Onset  . Epilepsy Father   . Cancer Father        lung cancer  . COPD Maternal Grandmother   . Hypertension Maternal Grandmother   . Heart disease Maternal Grandmother   . Cancer Paternal Grandfather     Social History Social History   Tobacco Use  . Smoking status: Current Every Day Smoker    Packs/day: 0.25    Types: Cigarettes  . Smokeless tobacco: Never Used  Substance Use Topics  . Alcohol use:  Not Currently    Alcohol/week: 8.0 - 10.0 standard drinks    Types: 3 - 4 Cans of beer, 5 - 6 Shots of liquor per week    Comment: socially  . Drug use: No     Allergies   Lisinopril   Review of Systems Review of Systems  Constitutional: Negative for appetite change, chills and fever.  HENT: Negative for congestion.   Respiratory: Negative for shortness of breath.   Cardiovascular: Negative for chest pain.  Gastrointestinal: Negative for abdominal distention.  Genitourinary: Negative for flank pain.  Skin: Positive for wound.  Neurological: Negative for weakness.  Psychiatric/Behavioral: Negative for confusion.     Physical Exam Updated Vital Signs BP 136/85   Pulse 62   Temp 98 F (36.7 C) (Oral)   Resp 16   LMP 06/29/2018   SpO2 99%   Physical Exam HENT:     Head: Normocephalic.     Mouth/Throat:     Mouth: Mucous  membranes are dry.  Neck:     Musculoskeletal: Neck supple.  Cardiovascular:     Rate and Rhythm: Normal rate.  Pulmonary:     Comments: Tender mass to left medial breast.  Approximately 8 x 4 cm.  There is a medial fluctuant area on it approximately 2 cm.  There is surrounding induration. Abdominal:     Tenderness: There is no abdominal tenderness.  Skin:    General: Skin is warm.     Capillary Refill: Capillary refill takes less than 2 seconds.  Neurological:     Mental Status: She is alert.      ED Treatments / Results  Labs (all labs ordered are listed, but only abnormal results are displayed) Labs Reviewed  I-STAT CHEM 8, ED - Abnormal; Notable for the following components:      Result Value   Glucose, Bld 279 (*)    All other components within normal limits  I-STAT BETA HCG BLOOD, ED ([facility], [facility], [facility] ONLY) - Abnormal; Notable for the following components:   I-stat hCG, quantitative 10.8 (*)    All other components within normal limits  AEROBIC CULTURE (SUPERFICIAL SPECIMEN)  CBC WITH DIFFERENTIAL/PLATELET    EKG None  Radiology No results found.  Procedures .Marland KitchenIncision and Drainage Date/Time: 07/06/2018 4:23 PM Performed by: Davonna Belling, MD Authorized by: Davonna Belling, MD   Consent:    Consent obtained:  Verbal   Consent given by:  Patient   Risks discussed:  Bleeding, incomplete drainage, pain and infection   Alternatives discussed:  No treatment Location:    Type:  Abscess   Size:  7cm   Location:  Trunk Pre-procedure details:    Skin preparation:  Chloraprep Anesthesia (see MAR for exact dosages):    Anesthesia method:  Local infiltration   Local anesthetic:  Lidocaine 2% w/o epi Procedure type:    Complexity:  Simple Procedure details:    Incision types:  Single straight   Scalpel blade:  11   Wound management:  Probed and deloculated   Drainage:  Purulent   Drainage amount:  Copious   Wound treatment:  Wound left open   Packing  materials:  None Post-procedure details:    Patient tolerance of procedure:  Tolerated well, no immediate complications   (including critical care time)  Medications Ordered in ED Medications  fentaNYL (SUBLIMAZE) injection 50 mcg (50 mcg Intravenous Given 07/06/18 0930)  lidocaine-EPINEPHrine (XYLOCAINE W/EPI) 2 %-1:200000 (PF) injection 10 mL (10 mLs Infiltration Given by Other 07/06/18 1000)  Initial Impression / Assessment and Plan / ED Course  I have reviewed the triage vital signs and the nursing notes.  Pertinent labs & imaging results that were available during my care of the patient were reviewed by me and considered in my medical decision making (see chart for details).     Left side breast abscess.  Large fluctuant area was drained in the ER by me.  Large amount of purulence and culture was sent.  Will have follow-up with general surgery  Final Clinical Impressions(s) / ED Diagnoses   Final diagnoses:  Breast abscess    ED Discharge Orders         Ordered    sulfamethoxazole-trimethoprim (BACTRIM DS,SEPTRA DS) 800-160 MG tablet  2 times daily     07/06/18 1144    cephALEXin (KEFLEX) 500 MG capsule  4 times daily     07/06/18 1144           Davonna Belling, MD 07/06/18 1624

## 2018-07-06 NOTE — ED Notes (Signed)
ED Provider at bedside. 

## 2018-07-11 LAB — AEROBIC CULTURE W GRAM STAIN (SUPERFICIAL SPECIMEN)

## 2018-07-12 ENCOUNTER — Telehealth: Payer: Self-pay | Admitting: Behavioral Health

## 2018-07-12 NOTE — Telephone Encounter (Signed)
-----   Message from Cliffton Asters, MD sent at 07/12/2018  2:31 PM EST ----- Regarding: ED referral I was called by an ED provider today.  [name] was seen in the ED recently and found to have a breast abscess.  After discharge from the ED cultures were shown to be growing actinomyces.  Please schedule her to be seen in clinic.  I asked him to start her on amoxicillin clavulanate.

## 2018-07-12 NOTE — Telephone Encounter (Signed)
Thanks

## 2018-07-12 NOTE — Telephone Encounter (Signed)
Called patient to schedule a new patient appointment per Dr. Orvan Falconer.  She is scheduled with Dr. Luciana Axe Tuesday Jan 14 at 9:00am. Angeline Slim RN

## 2018-07-12 NOTE — ED Provider Notes (Signed)
Was notified by pharmacy today about patient wound culture that was taken from her breast abscess drained several days ago in the ED.  He states that the wound culture grew all and actinomyces species.  He was concerned that this will need a prolonged course of antibiotics.  Patient was initially placed on Bactrim and Keflex however felt that amoxicillin would be more beneficial for patient.  I end up speaking with infectious disease physician Dr. Orvan Falconer concerning patient.  He felt the patient would benefit from being seen in their clinic.  I gave Dr. Orvan Falconer patient's MRN number and date of birth and he will follow-up for an outpatient clinic visit.  He recommends that we switch patient from the Bactrim and Keflex to Augmentin twice daily.  We will give patient a 14-day course until she can follow-up in the infectious disease outpatient clinic.  I called patient and discussed the findings with her.  She has give me the pharmacy that she uses.  I did call [facility] and call patient's prescription for 875 mg Augmentin 1 capsule twice daily for 14 days.  I discussed with patient that if she develops any worsening symptoms to return the ED.  I instructed her to expect a phone call from the infectious disease outpatient clinic for a follow-up point in the next 1 to 2 weeks.  She states that she is doing well and has no further complaints at this time.   Rise Mu, PA-C 07/12/18 1043    Azalia Bilis, MD 07/12/18 [phone]

## 2018-07-13 ENCOUNTER — Other Ambulatory Visit (HOSPITAL_COMMUNITY)
Admission: RE | Admit: 2018-07-13 | Discharge: 2018-07-13 | Disposition: A | Discharge status: Home / Self Care | Payer: Medicaid Other | Source: Ambulatory Visit | Attending: Student | Admitting: Student

## 2018-07-13 ENCOUNTER — Ambulatory Visit (INDEPENDENT_AMBULATORY_CARE_PROVIDER_SITE_OTHER): Payer: Medicaid Other | Admitting: Student

## 2018-07-13 ENCOUNTER — Encounter: Payer: Self-pay | Admitting: Student

## 2018-07-13 ENCOUNTER — Ambulatory Visit (INDEPENDENT_AMBULATORY_CARE_PROVIDER_SITE_OTHER): Payer: Self-pay | Admitting: Clinical

## 2018-07-13 VITALS — BP 120/74 | HR 84 | Wt 267.2 lb

## 2018-07-13 DIAGNOSIS — F332 Major depressive disorder, recurrent severe without psychotic features: Secondary | ICD-10-CM

## 2018-07-13 DIAGNOSIS — Z01419 Encounter for gynecological examination (general) (routine) without abnormal findings: Secondary | ICD-10-CM

## 2018-07-13 DIAGNOSIS — Z5989 Other problems related to housing and economic circumstances: Secondary | ICD-10-CM

## 2018-07-13 DIAGNOSIS — Z3202 Encounter for pregnancy test, result negative: Secondary | ICD-10-CM | POA: Diagnosis not present

## 2018-07-13 DIAGNOSIS — Z598 Other problems related to housing and economic circumstances: Secondary | ICD-10-CM

## 2018-07-13 DIAGNOSIS — Z30017 Encounter for initial prescription of implantable subdermal contraceptive: Secondary | ICD-10-CM

## 2018-07-13 LAB — POCT PREGNANCY, URINE: Preg Test, Ur: NEGATIVE

## 2018-07-13 MED ORDER — ETONOGESTREL 68 MG ~~LOC~~ IMPL
68.0000 mg | DRUG_IMPLANT | Freq: Once | SUBCUTANEOUS | Status: AC
  Administered 2018-07-13: 68 mg via SUBCUTANEOUS

## 2018-07-13 NOTE — Patient Instructions (Signed)
Nexplanon Instructions After Insertion  Keep bandage clean and dry for 24 hours  May use ice/Tylenol/Ibuprofen for soreness or pain  If you develop fever, drainage or increased warmth from incision site-contact office immediately   

## 2018-07-13 NOTE — Progress Notes (Signed)
History:  [name] is a 28 y.o. [id number] who presents to clinic today for pap smear & contraception.  Pt was seen on 12/27 but had to return for completion of annual d/t vaginal bleeding.  Currently no bleeding. Patient read over contraception information previously provided & is interested in nexplanon.  She is currently sexually active & reports using condoms regularly.     Patient Active Problem List   Diagnosis Date Noted  . Uncontrolled diabetes mellitus ([place]) 06/04/2013  . 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 PCOS (polycystic ovarian syndrome) 01/08/2013    Allergies  Allergen Reactions  . Lisinopril Other (See Comments) and Shortness Of Breath    Chest pains Chest pains and tightness    Current Outpatient Medications on File Prior to Visit  Medication Sig Dispense Refill  . Blood Glucose Monitoring Suppl (TRUE METRIX METER) w/Device KIT Use as directed 1 kit 0  . glucose blood (TRUE METRIX BLOOD GLUCOSE TEST) test strip Use as instructed 100 each 12  . insulin glargine (LANTUS) 100 UNIT/ML injection Inject 0.15 mLs (15 Units total) into the skin at bedtime. 10 mL 1  . metFORMIN (GLUCOPHAGE) 500 MG tablet Take 1 tablet (500 mg total) by mouth 2 (two) times daily with a meal. 60 tablet 1  . metroNIDAZOLE (FLAGYL) 500 MG tablet Take 1 tablet (500 mg total) by mouth 2 (two) times daily. 14 tablet 0  . cephALEXin (KEFLEX) 500 MG capsule Take 1 capsule (500 mg total) by mouth 4 (four) times daily. (Patient not taking: Reported on 07/13/2018) 28 capsule 0  . glipiZIDE (GLUCOTROL) 5 MG tablet Take 1 tablet (5 mg total) by mouth daily before breakfast. (Patient not taking: Reported on 02/28/2018) 30 tablet 2  . sulfamethoxazole-trimethoprim (BACTRIM DS,SEPTRA DS) 800-160 MG tablet Take 1 tablet by mouth 2 (two) times daily for 7 days. (Patient not taking: Reported on 07/13/2018) 14 tablet 0   No current facility-administered medications on file prior to visit.      The  following portions of the patient's history were reviewed and updated as appropriate: allergies, current medications, family history, past medical history, social history, past surgical history and problem list.  Review of Systems:  Other than those mentioned in HPI all ROS negative   Objective:  Physical Exam BP 120/74   Pulse 84   Wt 267 lb 3.2 oz (121.2 kg)   LMP 06/29/2018   BMI 43.13 kg/m  CONSTITUTIONAL: Well-developed, well-nourished female in no acute distress.  HEAD: Normocephalic, atraumatic RESPIRATORY: Effort normal, no problems with respiration noted. GENITOURINARY: NEFG. Cervix pink/smooth. Pap smear collected. Physiologic discharge SKIN: Skin is warm and dry. No rash noted. Not diaphoretic. No erythema. No pallor. PSYCHIATRIC: Normal mood and affect. Normal behavior. Normal judgment and thought content.   NEXPLANON INSERTION: Appropriate time out taken. Nexlanon site (left arm) identified and the area was prepped in usual sterile fashon. 2 cc of 1% lidocaine was used to anesthetize the area starting with the distal end.   Next, the area was cleansed with betadine and the Nexplanon was inserted without difficulty.  Pressure bandage was applied.  Pt was instructed to remove pressure bandage in a few hours, and keep insertion site covered with a bandaid for 3 days.   Assessment & Plan:  1. Pap smear, as part of routine gynecological examination  - Cytology - PAP( [facility])  2. Encounter for initial prescription of implantable subdermal contraceptive -pt had istat hcg of 10 in ED last week. Negative  UPT on 12/27 & today. LMP 12/26. Discussed patient with Dr. Kennon Rounds. Per patient preference, will insert nexplanon d/t low likelihood of pregnancy. Pt will return in 2 weeks for UPT.  - etonogestrel (NEXPLANON) implant 68 mg   Jorje Guild, NP 07/13/2018 4:21 PM

## 2018-07-13 NOTE — BH Specialist Note (Signed)
Integrated Behavioral Health Initial Visit  MRN: [id number] Name: [name] [name]  Number of Integrated Behavioral Health Clinician visits:: 1/6 Session Start time: 3;59  Session End time: 4:22 Total time: 20 minutes  Type of Service: Integrated Behavioral Health- Individual/Family Interpretor:No. Interpretor Name and Language: n/a   Warm Hand Off Completed.       SUBJECTIVE: [name] [name] is a 28 y.o. female accompanied by n/a Patient was referred by Judeth Horn, NP for depression and anxiety symptoms Patient reports the following symptoms/concerns: Pt states her primary concern today is being untreated for depression for the past 5 years, took [facility] medication for one week 5 years ago, and was seeing psychiatrist, but is currently uninsured, and is interested in finding out her options for ongoing care.  Duration of problem: Ongoing for at least 5 years; Severity of problem: severe  OBJECTIVE: Mood: Depressed and Affect: Depressed Risk of harm to self or others: No plan to harm self or others  LIFE CONTEXT: Family and Social: Pt lives with her almost 5yo daughter School/Work: Pt working full-time Self-Care: Beginning to recognize greater need for self-care Life Changes: Recent TAB  GOALS ADDRESSED: Patient will: 1. Reduce symptoms of: anxiety and depression 2. Demonstrate ability to: Increase motivation to adhere to plan of care  INTERVENTIONS: Interventions utilized: Motivational Interviewing, Psychoeducation and/or Health Education and Link to [facility]  Standardized Assessments completed: GAD-7 and PHQ 9  ASSESSMENT: Patient currently experiencing Major depressive disorder, recurrent, severe, without psychotic features.   Patient may benefit from psychoeducation and brief therapeutic interventions regarding coping with symptoms of depression and anxiety .  PLAN: 1. Follow up with behavioral health clinician on : One week phone follow-up 2. Behavioral  recommendations:  -[facility] walk-in clinic at 8am on 07/17/2018 -Continue with plans to host daughter's 5yo birthday party within one week; enjoy every moment -Consider reading educational materials regarding coping with symptoms of depression and anxiety -Consider apps discussed in office visit, for additional self-care this week 3. Referral(s): Integrated [facility] (In Clinic) and [facility] (LME/Outside Clinic) 4. "From scale of 1-10, how likely are you to follow plan?": -  Rae Lips, LCSW  Depression screen [facility] 2/9 07/13/2018 06/30/2018 03/11/2017 07/15/2016 09/26/2015  Decreased Interest 2 3 1 2 3   Down, Depressed, Hopeless 3 3 1 2 3   PHQ - 2 Score 5 6 2 4 6   Altered sleeping 3 3 0 2 3  Tired, decreased energy 3 3 1 2 3   Change in appetite 3 3 1 3 3   Feeling bad or failure about yourself  3 3 1 3 3   Trouble concentrating 3 3 1 1 3   Moving slowly or fidgety/restless 3 0 1 0 3  Suicidal thoughts 0 0 1 1 3   PHQ-9 Score 23 21 8 16 27    GAD 7 : Generalized Anxiety Score 07/13/2018 06/30/2018 03/11/2017 07/15/2016  Nervous, Anxious, on Edge 3 3 1 3   Control/stop worrying 3 3 1 3   Worry too much - different things 3 3 1 3   Trouble relaxing 3 3 1 3   Restless 2 2 1 3   Easily annoyed or irritable 3 3 1 3   Afraid - awful might happen 3 3 1 3   Total GAD 7 Score 20 20 7  21

## 2018-07-13 NOTE — Addendum Note (Signed)
Addended by: Hulda Marin C on: 07/13/2018 04:43 PM   Modules accepted: Orders

## 2018-07-14 LAB — CYTOLOGY - PAP: Diagnosis: NEGATIVE

## 2018-07-18 ENCOUNTER — Ambulatory Visit (INDEPENDENT_AMBULATORY_CARE_PROVIDER_SITE_OTHER): Payer: Self-pay | Admitting: Internal Medicine

## 2018-07-18 ENCOUNTER — Encounter: Payer: Self-pay | Admitting: Internal Medicine

## 2018-07-18 DIAGNOSIS — A429 Actinomycosis, unspecified: Secondary | ICD-10-CM

## 2018-07-18 MED ORDER — AMOXICILLIN-POT CLAVULANATE 875-125 MG PO TABS: 1.0000 | ORAL_TABLET | Freq: Two times a day (BID) | ORAL | 2 refills | Status: DC

## 2018-07-18 NOTE — Progress Notes (Signed)
[facility] for Infectious Disease      Reason for Consult: Actinomyces abscess    Referring Physician: Dr. Alvino Chapel    Patient ID: [name] [name], female    DOB: May 20, 1991, 28 y.o.   MRN: [id number]  HPI:   She is here for evaluation of above after ED visit. She developed an abscess in her breast and went to the ED for an I and D.  Placed initially on Bactrim and Keflex then culture grew Actinomyces.  Discussed with ID and changed to Augmentin and sent here for evaluation for complex infection.  Improving overall.  No associated fever or chills.  Has had previous abscesses in breast and bilateral groin areas.  Has diabetes and previously poorly controlled, most recent A1C down to 7.7.  No complaints otherwise.  Previous record reviewed in Epic and ED visit, culture results.    Past Medical History:  Diagnosis Date  . 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 Diabetes mellitus    Type II  . Gonorrhea   . Hirsutism   . PCOS (polycystic ovarian syndrome)     Prior to Admission medications   Medication Sig Start Date End Date Taking? Authorizing Provider  Blood Glucose Monitoring Suppl (TRUE METRIX METER) w/Device KIT Use as directed 07/28/16   Tresa Garter, MD  cephALEXin (KEFLEX) 500 MG capsule Take 1 capsule (500 mg total) by mouth 4 (four) times daily. Patient not taking: Reported on 07/13/2018 07/06/18   Davonna Belling, MD  glipiZIDE (GLUCOTROL) 5 MG tablet Take 1 tablet (5 mg total) by mouth daily before breakfast. Patient not taking: Reported on 02/28/2018 03/11/17   Alfonse Spruce, FNP  glucose blood (TRUE METRIX BLOOD GLUCOSE TEST) test strip Use as instructed 03/11/17   Alfonse Spruce, FNP  insulin glargine (LANTUS) 100 UNIT/ML injection Inject 0.15 mLs (15 Units total) into the skin at bedtime. 01/16/18   Varney Biles, MD  metFORMIN (GLUCOPHAGE) 500 MG tablet Take 1 tablet (500 mg total) by mouth 2 (two) times daily with a meal. 01/16/18   Varney Biles, MD    metroNIDAZOLE (FLAGYL) 500 MG tablet Take 1 tablet (500 mg total) by mouth 2 (two) times daily. 07/06/18   Jorje Guild, NP    Allergies  Allergen Reactions  . Lisinopril Other (See Comments) and Shortness Of Breath    Chest pains Chest pains and tightness    Social History   Tobacco Use  . Smoking status: Current Every Day Smoker    Packs/day: 0.25    Types: Cigarettes  . Smokeless tobacco: Never Used  Substance Use Topics  . Alcohol use: Not Currently    Alcohol/week: 8.0 - 10.0 standard drinks    Types: 3 - 4 Cans of beer, 5 - 6 Shots of liquor per week    Comment: socially  . Drug use: No    Family History  Problem Relation Age of Onset  . Epilepsy Father   . Cancer Father        lung cancer  . COPD Maternal Grandmother   . Hypertension Maternal Grandmother   . Heart disease Maternal Grandmother   . Cancer Paternal Grandfather      Review of Systems  Constitutional: negative for fevers, chills, malaise and anorexia Gastrointestinal: negative for nausea and diarrhea Integument/breast: negative for rash Hematologic/lymphatic: negative for lymphadenopathy All other systems reviewed and are negative    Constitutional: in no apparent distress  Vitals:   07/18/18 0912  Weight: 275 lb (124.7  kg)   EYES: anicteric ENMT: no thrush Cardiovascular: Cor RRR Respiratory: CTA B; normal respiratory effort Musculoskeletal: no pedal edema noted Skin: negatives: no rash; abscess area closed, no erythema, no warmth Neuro: non focal  Labs: Lab Results  Component Value Date   WBC 7.9 07/06/2018   HGB 12.6 07/06/2018   HCT 37.0 07/06/2018   MCV 94.5 07/06/2018   PLT 185 07/06/2018    Lab Results  Component Value Date   CREATININE 0.50 07/06/2018   BUN 8 07/06/2018   NA 139 07/06/2018   K 4.2 07/06/2018   CL 102 07/06/2018   CO2 27 03/17/2018    Lab Results  Component Value Date   ALT 15 03/17/2018   AST 13 (L) 03/17/2018   ALKPHOS 74 03/17/2018    BILITOT 0.5 03/17/2018     Assessment: Actinomyeces with abscess.  I discussed complications of Actinomyces and need for prolonged treated with penicillin-based treatment.  Will treat for 3 months with Augmentin.  Can use amoxicillin 500 mg TID if she develops any GI issues/side effects.    Plan: 1) Augmentin 875 twice a day, 1 month 2 refills rtc about 10 weeks

## 2018-07-25 ENCOUNTER — Encounter (HOSPITAL_COMMUNITY): Payer: Self-pay | Admitting: Family Medicine

## 2018-07-25 ENCOUNTER — Emergency Department (HOSPITAL_COMMUNITY)
Admission: EM | Admit: 2018-07-25 | Discharge: 2018-07-25 | Disposition: A | Discharge status: Home / Self Care | Payer: Medicaid Other | Attending: Emergency Medicine | Admitting: Emergency Medicine

## 2018-07-25 DIAGNOSIS — J111 Influenza due to unidentified influenza virus with other respiratory manifestations: Secondary | ICD-10-CM | POA: Insufficient documentation

## 2018-07-25 DIAGNOSIS — E119 Type 2 diabetes mellitus without complications: Secondary | ICD-10-CM | POA: Insufficient documentation

## 2018-07-25 DIAGNOSIS — F17210 Nicotine dependence, cigarettes, uncomplicated: Secondary | ICD-10-CM | POA: Insufficient documentation

## 2018-07-25 LAB — INFLUENZA PANEL BY PCR (TYPE A & B)
Influenza A By PCR: POSITIVE — AB
Influenza B By PCR: NEGATIVE

## 2018-07-25 LAB — GROUP A STREP BY PCR: Group A Strep by PCR: NOT DETECTED

## 2018-07-25 MED ORDER — OSELTAMIVIR PHOSPHATE 75 MG PO CAPS: 75.0000 mg | ORAL_CAPSULE | Freq: Two times a day (BID) | ORAL | 0 refills | Status: DC

## 2018-07-25 MED ORDER — ACETAMINOPHEN 325 MG PO TABS
650.0000 mg | ORAL_TABLET | Freq: Once | ORAL | Status: AC
  Administered 2018-07-25: 650 mg via ORAL
  Filled 2018-07-25: qty 2

## 2018-07-25 NOTE — Discharge Instructions (Signed)
Your flu test was positive.  I have prescribed you Tamiflu.  You may also take Tylenol and ibuprofen as needed for body aches and pains.  Please make sure to drink liquids so you remain hydrated.  Follow-up with PCP for reevaluation if you continue to have symptoms beyond 3 days.  Return to the ED for any worsening symptoms.

## 2018-07-25 NOTE — ED Triage Notes (Signed)
Patient is complaining of sore throat, neck and back pain. Symptoms started yesterday. Unknown of fever. Patient reports taking Alzer pills with no relief. Patient is ambulatory from lobby to triage room with a steady gait.

## 2018-07-25 NOTE — ED Provider Notes (Signed)
[facility] DEPT Provider Note   CSN: [id number] Arrival date & time: 07/25/18  1319  History   Chief Complaint Chief Complaint  Patient presents with  . Sore Throat  . Back Pain    HPI [name] [name] is a 28 y.o. 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  Patient states she had sudden onset sore throat, neck pain, back pain, allover body pain, nasal congestion, rhinorrhea which began yesterday morning.  Patient denies sick contacts.  Did not receive influenza vaccine.  Patient states she has been taking Alka-Seltzer without relief of her symptoms.  Denies fever, chills, nausea, vomiting, headache, vision changes, chest pain, cough, hemoptysis, shortness of breath, abdominal pain, diarrhea or dysuria.  Denies additional aggravating relieving factors.  Patient states she is able to tolerate p.o. intake without difficulty, however states it hurts to swallow.  No drooling, dysphasia or trismus.  History obtained from patient.  No interpreter was used.  HPI  Past Medical History:  Diagnosis Date  . 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 Diabetes mellitus    Type II  . Gonorrhea   . Hirsutism   . PCOS (polycystic ovarian syndrome)     Patient Active Problem List   Diagnosis Date Noted  . 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 Uncontrolled diabetes mellitus ([place]) 06/04/2013  . Cushings syndrome ([place]) 01/08/2013  . PCOS (polycystic ovarian syndrome) 01/08/2013    Past Surgical History:  Procedure Laterality Date  . ABSCESS DRAINAGE    . THERAPEUTIC ABORTION    . TOOTH EXTRACTION       OB History    Gravida  3   Para  1   Term  1   Preterm  0   AB  2   Living  1     SAB  1   TAB  1   Ectopic  0   Multiple  0   Live Births  1            Home Medications    Prior to Admission medications   Medication Sig Start Date End  Date Taking? Authorizing Provider  amoxicillin-clavulanate (AUGMENTIN) 875-125 MG tablet Take 1 tablet by mouth 2 (two) times daily. 07/18/18   Comer, Okey Regal, MD  Blood Glucose Monitoring Suppl (TRUE METRIX METER) w/Device KIT Use as directed 07/28/16   Tresa Garter, MD  glipiZIDE (GLUCOTROL) 5 MG tablet Take 1 tablet (5 mg total) by mouth daily before breakfast. Patient not taking: Reported on 07/18/2018 03/11/17   Alfonse Spruce, FNP  glucose blood (TRUE METRIX BLOOD GLUCOSE TEST) test strip Use as instructed Patient not taking: Reported on 07/18/2018 03/11/17   Alfonse Spruce, FNP  insulin glargine (LANTUS) 100 UNIT/ML injection Inject 0.15 mLs (15 Units total) into the skin at bedtime. Patient not taking: Reported on 07/18/2018 01/16/18   Varney Biles, MD  metFORMIN (GLUCOPHAGE) 500 MG tablet Take 1 tablet (500 mg total) by mouth 2 (two) times daily with a meal. Patient not taking: Reported on 07/18/2018 01/16/18   Varney Biles, MD  oseltamivir (TAMIFLU) 75 MG capsule Take 1 capsule (75 mg total) by mouth every 12 (twelve) hours. 07/25/18   Mahagony Grieb A, PA-C    Family History Family History  Problem Relation Age of Onset  . Epilepsy Father   . Cancer Father        lung cancer  . COPD Maternal  Grandmother   . Hypertension Maternal Grandmother   . Heart disease Maternal Grandmother   . Cancer Paternal Grandfather     Social History Social History   Tobacco Use  . Smoking status: Current Every Day Smoker    Packs/day: 0.25    Types: Cigarettes  . Smokeless tobacco: Never Used  Substance Use Topics  . Alcohol use: Not Currently  . Drug use: No     Allergies   Lisinopril   Review of Systems Review of Systems  Constitutional: Positive for fatigue. Negative for appetite change, chills and fever.  HENT: Positive for congestion, postnasal drip, rhinorrhea and sore throat. Negative for sinus pressure, sinus pain, sneezing, tinnitus, trouble swallowing  and voice change.   Respiratory: Negative.   Cardiovascular: Negative.   Gastrointestinal: Negative.   Genitourinary: Negative.   Musculoskeletal: Positive for arthralgias, back pain and neck pain. Negative for gait problem.  Skin: Negative.   Neurological: Negative.   All other systems reviewed and are negative.    Physical Exam Updated Vital Signs BP 135/76   Pulse 91   Temp 99.7 F (37.6 C) (Oral)   Resp 20   Ht 5' 7"  (1.702 m)   Wt 122.5 kg   LMP 06/29/2018   SpO2 99%   BMI 42.29 kg/m   Physical Exam  Physical Exam  Constitutional: Pt appears well-developed and well-nourished. No distress.  HENT:  Head: Normocephalic and atraumatic.  Mouth/Throat: Oropharynx is clear and moist. No oropharyngeal exudate.  Mucous membranes moist.  Uvula midline without deviation.  Tonsils without edema or exudate.  Posterior oropharynx with mild erythema without exudate. Eyes: Conjunctivae are normal.  Neck: Normal range of motion. Neck supple.  Full ROM without pain. No tenderness palpation. No meningismus. Negative Brudzinski's and Kernig's sign. Cardiovascular: Normal rate, regular rhythm and intact distal pulses.   Pulmonary/Chest: Effort normal and breath sounds normal. No respiratory distress. Pt has no wheezes.  Abdominal: Soft. Pt exhibits no distension. There is no tenderness, rebound or guarding. No abd bruit or pulsatile mass Musculoskeletal:  Full range of motion of the T-spine and L-spine with flexion, hyperextension, and lateral flexion. No midline tenderness or stepoffs. No tenderness to palpation of the spinous processes of the T-spine or L-spine. No tenderness to palpation of the paraspinous muscles of the L-spine. straight leg raise. Lymphadenopathy:    Pt has no cervical adenopathy.  Neurological: Pt is alert. Pt has normal reflexes.  Reflex Scores:      Bicep reflexes are 2+ on the right side and 2+ on the left side.      Brachioradialis reflexes are 2+ on the  right side and 2+ on the left side.      Patellar reflexes are 2+ on the right side and 2+ on the left side.      Achilles reflexes are 2+ on the right side and 2+ on the left side. Speech is clear and goal oriented, follows commands Normal 5/5 strength in upper and lower extremities bilaterally including dorsiflexion and plantar flexion, strong and equal grip strength Sensation normal to light and sharp touch Moves extremities without ataxia, coordination intact Normal gait Normal balance No Clonus Skin: Skin is warm and dry. No rash noted or lesions noted. Pt is not diaphoretic. No erythema, ecchymosis,edema or warmth.  Psychiatric: Pt has a normal mood and affect. Behavior is normal.  Nursing note and vitals reviewed. ED Treatments / Results  Labs (all labs ordered are listed, but only abnormal results are displayed) Labs  Reviewed  INFLUENZA PANEL BY PCR (TYPE A & B) - Abnormal; Notable for the following components:      Result Value   Influenza A By PCR POSITIVE (*)    All other components within normal limits  GROUP A STREP BY PCR    EKG None  Radiology No results found.  Procedures Procedures (including critical care time)  Medications Ordered in ED Medications  acetaminophen (TYLENOL) tablet 650 mg (650 mg Oral Given 07/25/18 1506)     Initial Impression / Assessment and Plan / ED Course  I have reviewed the triage vital signs and the nursing notes.  Pertinent labs & imaging results that were available during my care of the patient were reviewed by me and considered in my medical decision making (see chart for details).  28 year old female who appears otherwise well presents for evaluation of flulike symptoms.  Symptom onset yesterday morning.  Did not receive influenza vaccine.  Patient with complaints of sore throat, back pain, neck pain, rhinorrhea, congestion, all over body aches and pains.  No cough, trismus, dysphagia, drooling.  Phonation normal.  Posterior  oropharynx with erythema without exudate.  Uvula midline without deviation.  Tonsils without edema or exudate.  Able to tolerate oral secretions without difficulty.  No evidence of RPA, PTA.  Lungs clear to auscultation bilaterally without wheeze, rhonchi or rales.  Abdomen soft, nontender without rebound or guarding.  Neck and back exam unremarkable.  Nonfocal neurologic exam without neurologic deficits.  Normal musculoskeletal exam.  Patient able to tolerate p.o. intake in department that difficulty.  Patient is afebrile does not appear septic.  There is no tachypnea.  Oxygen saturation 97% on room air with good waveform.  Influenza screen positive.  Strep screen negative.  Low suspicion for meningitis causing patient's neck and back pain.  It seems on exam patient has all over body aches and pains nothing specific to neck and back.  No stiffness or rigidity to suggest meningismus.  Vitals are stable, low-grade fever.  No signs of dehydration, tolerating PO's.  Lungs are clear. Due to patient's presentation and physical exam a chest x-ray was not ordered bc likely diagnosis of flu.  Discussed the cost versus benefit of Tamiflu treatment with the patient.  Patient will be discharged with instructions to orally hydrate, rest, and use over-the-counter medications such as anti-inflammatories ibuprofen and Aleve for muscle aches and Tylenol for fever.  Given prescription for Tamiflu, per patient request.  Patient is hemodynamically stable and appropriate for DC home at this time.  Discussed strict return precautions.  Patient voiced understanding and is agreeable for follow-up.      Final Clinical Impressions(s) / ED Diagnoses   Final diagnoses:  Influenza    ED Discharge Orders         Ordered    oseltamivir (TAMIFLU) 75 MG capsule  Every 12 hours     07/25/18 1546           Tynisha Ogan A, PA-C 07/25/18 1600    Drenda Freeze, MD 07/25/18 [phone]

## 2018-07-26 ENCOUNTER — Ambulatory Visit: Payer: Self-pay

## 2018-08-03 ENCOUNTER — Telehealth: Payer: Self-pay

## 2018-08-03 NOTE — Telephone Encounter (Signed)
Thanks.  She was flu positive.

## 2018-08-03 NOTE — Telephone Encounter (Signed)
Patient called stating she has recently been have vomiting and diarrhea but then also states she was just recently recovering from the Flu and wanted to make sure that her symptoms could be related to her antibiotic therapy.  Patient advised that her symptoms could have been related to her having the Flu. Patient agreed. These symptoms have now resolved. Routing to Dr. Luciana Axe for any additional advise. Valarie Cones, LPN

## 2018-09-25 ENCOUNTER — Other Ambulatory Visit: Payer: Self-pay | Admitting: *Deleted

## 2018-09-25 MED ORDER — FLUCONAZOLE 150 MG PO TABS: 150.0000 mg | ORAL_TABLET | Freq: Every day | ORAL | 3 refills | Status: DC

## 2018-09-25 NOTE — Telephone Encounter (Signed)
-----   Message from Gardiner Barefoot, MD sent at 09/25/2018  1:02 PM EDT ----- Regarding: RE: Medication Reaction Diflucan 150 mg po x once, 3 refills. Also, refill the Augmentin one month and have her reschedule tomorrows visit for 6 weeks or so.   ----- Message ----- From: Lurlean Leyden, CMA Sent: 09/25/2018  10:57 AM EDT To: Gardiner Barefoot, MD Subject: FW: Medication Reaction                        Is it ok to send in Diflucan for this patient due to yeast infection and if so please give dose. Feliz Beam ----- Message ----- From: Francesco Runner Sent: 09/25/2018  10:24 AM EDT To: [facility] Triage Nurse Pool Subject: Medication Reaction                            Patient called in regards to a medication she is taking (amoxicillan) she said she had a reaction when taking (yeast infection) if someone could give her a call back.

## 2018-09-25 NOTE — Telephone Encounter (Signed)
Called the patient to ask where to send the Diflucan and she advised she went to the urgent care in the mean time and does not need the diflucan called in. Advised will add to her list and reminded her of the office visit tomorrow 09/26/18.

## 2018-09-26 ENCOUNTER — Encounter: Payer: Self-pay | Admitting: Family

## 2018-09-26 ENCOUNTER — Ambulatory Visit (INDEPENDENT_AMBULATORY_CARE_PROVIDER_SITE_OTHER): Payer: Self-pay | Admitting: Family

## 2018-09-26 ENCOUNTER — Other Ambulatory Visit: Payer: Self-pay

## 2018-09-26 VITALS — BP 110/74 | HR 79 | Temp 98.1°F | Wt 267.0 lb

## 2018-09-26 DIAGNOSIS — A429 Actinomycosis, unspecified: Secondary | ICD-10-CM

## 2018-09-26 MED ORDER — AMOXICILLIN-POT CLAVULANATE 875-125 MG PO TABS: 1.0000 | ORAL_TABLET | Freq: Two times a day (BID) | ORAL | 2 refills | Status: DC

## 2018-09-26 NOTE — Assessment & Plan Note (Signed)
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 Initially started on Augmentin, but self-discontinued medication about 1-2 weeks after starting due to development of yeast infection. We discussed the need for long duration of treatment with actinomyces infections and the need to restart the 3 month treatment with new end date for Augmentin being June 12th. Prescription for fluconazole was provided for yeast infection as needed and follow up scheduled one week prior to end of treatment or sooner if needed.

## 2018-09-26 NOTE — Patient Instructions (Addendum)
Nice to see you.  We will re-start your Augmentin with end date of June 12.   We will plan to see you back the week before you stop medication.   I will send in a prescription for fluconazole for yeast infection as needed.

## 2018-09-26 NOTE — Progress Notes (Signed)
Subjective:    Patient ID: [name] [name], female    DOB: [birth year], 28 y.o.   MRN: [id number]  Chief Complaint  Patient presents with  . Breast Abscess     HPI:  [name] [name] is a 28 y.o. female who presents today for follow up of breast abscess.  [name] [name] was last seen in the clinic on 07/18/2018 with actinomyeces abscess with the plan for 3 months of Augmentin.   [name] [name] started taking the Augmentin and developed a vaginal yeast infection within 1-2 weeks of starting the medication in January and stopped taking the Augmentin until about 2-3 days ago. Reports increased size of the abscess located on the medial aspect of her left breast. There has been some drainage present. No systemic symptoms of fevers, chills or sweats.    Allergies  Allergen Reactions  . Lisinopril Other (See Comments) and Shortness Of Breath    Chest pains Chest pains and tightness      Outpatient Medications Prior to Visit  Medication Sig Dispense Refill  . Blood Glucose Monitoring Suppl (TRUE METRIX METER) w/Device KIT Use as directed 1 kit 0  . fluconazole (DIFLUCAN) 150 MG tablet Take 1 tablet (150 mg total) by mouth daily. 1 tablet 3  . oseltamivir (TAMIFLU) 75 MG capsule Take 1 capsule (75 mg total) by mouth every 12 (twelve) hours. 10 capsule 0  . amoxicillin-clavulanate (AUGMENTIN) 875-125 MG tablet Take 1 tablet by mouth 2 (two) times daily. 60 tablet 2  . glipiZIDE (GLUCOTROL) 5 MG tablet Take 1 tablet (5 mg total) by mouth daily before breakfast. (Patient not taking: Reported on 07/18/2018) 30 tablet 2  . glucose blood (TRUE METRIX BLOOD GLUCOSE TEST) test strip Use as instructed (Patient not taking: Reported on 07/18/2018) 100 each 12  . insulin glargine (LANTUS) 100 UNIT/ML injection Inject 0.15 mLs (15 Units total) into the skin at bedtime. (Patient not taking: Reported on 07/18/2018) 10 mL 1  . metFORMIN (GLUCOPHAGE) 500 MG tablet Take 1 tablet (500 mg total) by mouth 2 (two) times  daily with a meal. (Patient not taking: Reported on 07/18/2018) 60 tablet 1   No facility-administered medications prior to visit.      Past Medical History:  Diagnosis Date  . 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 Diabetes mellitus    Type II  . Gonorrhea   . Hirsutism   . PCOS (polycystic ovarian syndrome)      Past Surgical History:  Procedure Laterality Date  . ABSCESS DRAINAGE    . THERAPEUTIC ABORTION    . TOOTH EXTRACTION      Review of Systems  Constitutional: Negative for appetite change, chills, diaphoresis, fatigue, fever and unexpected weight change.  Eyes:       Negative for acute change in vision  Respiratory: Negative for chest tightness, shortness of breath and wheezing.   Cardiovascular: Negative for chest pain.  Gastrointestinal: Negative for diarrhea, nausea and vomiting.  Genitourinary: Negative for dysuria, pelvic pain and vaginal discharge.  Musculoskeletal: Negative for neck pain and neck stiffness.  Skin: Negative for rash.       Positive for abscess of left breast  Neurological: Negative for seizures, syncope, weakness and headaches.  Hematological: Negative for adenopathy. Does not bruise/bleed easily.  Psychiatric/Behavioral: Negative for hallucinations.      Objective:    BP 110/74   Pulse 79   Temp 98.1 F (36.7 C) (Oral)   Wt 267 lb (121.1 kg)   LMP 09/25/2018  BMI 41.82 kg/m  Nursing note and vital signs reviewed. Chaperone present for physical exam.   Physical Exam Constitutional:      General: She is not in acute distress.    Appearance: She is well-developed. She is not ill-appearing.     Comments: Seated in the chair; pleasant.   Cardiovascular:     Rate and Rhythm: Normal rate and regular rhythm.     Heart sounds: Normal heart sounds.  Pulmonary:     Effort: Pulmonary effort is normal.     Breath sounds: Normal breath sounds.  Chest:    Skin:    General: Skin is warm and dry.  Neurological:     Mental Status: She is alert  and oriented to person, place, and time.  Psychiatric:        Behavior: Behavior normal.        Thought Content: Thought content normal.        Judgment: Judgment normal.        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 Initially started on Augmentin, but self-discontinued medication about 1-2 weeks after starting due to development of yeast infection. We discussed the need for long duration of treatment with actinomyces infections and the need to restart the 3 month treatment with new end date for Augmentin being June 12th. Prescription for fluconazole was provided for yeast infection as needed and follow up scheduled one week prior to end of treatment or sooner if needed.           I am having [name] [name]. [name] [name] maintain her True Metrix Meter, glipiZIDE, glucose blood, metFORMIN, insulin glargine, oseltamivir, fluconazole, and amoxicillin-clavulanate.   Meds ordered this encounter  Medications  . amoxicillin-clavulanate (AUGMENTIN) 875-125 MG tablet    Sig: Take 1 tablet by mouth 2 (two) times daily.    Dispense:  60 tablet    Refill:  2    Order Specific Question:   Supervising Provider    Answer:   Carlyle Basques [[id number]]     Follow-up: Return in about 3 months (around 12/27/2018), or if symptoms worsen or fail to improve.   Terri Piedra, MSN, FNP-C Nurse Practitioner [facility] Office phone: [phone] Pager: [place] number: [phone]

## 2018-10-05 ENCOUNTER — Telehealth: Payer: Self-pay | Admitting: Family Medicine

## 2018-10-05 MED ORDER — MEDROXYPROGESTERONE ACETATE 10 MG PO TABS: 10.0000 mg | ORAL_TABLET | Freq: Every day | ORAL | 0 refills | Status: DC

## 2018-10-05 NOTE — Telephone Encounter (Signed)
Called pt and discussed her concerns regarding bleeding and desire to have Nexplanon removed. She stated that the Nexplanon was placed on 1/9. She began having light bleeding in February - unsure of start date. The bleeding became heavy on 2/22 and has not stopped. She reports changing her pad every hour which is approximately 30% saturated. I advised pt that her body is still adjusting to the birth control and this can take 3-6 months. Her symptoms are normal even though not pleasant. I encouraged her to be patient and allow more time for her body to adjust. Rx for short term provera received from Dr. Adrian Blackwater and pt was informed that this should stop her bleeding. If she is still having heavy bleeding on 4/6, she should call our office. Pt voiced understanding and agreed to plan of care.

## 2018-10-05 NOTE — Telephone Encounter (Signed)
The patient stated she would like to schedule an appointment to have her birth control removed as she has been bleeding a lot. Informed the patient of the appointment being scheduled in May and she will be placed on the wait list. She then stated she would like to talk to a doctor about the issue. Informed the patient I will send a message to the nurse.

## 2018-10-09 ENCOUNTER — Encounter (HOSPITAL_COMMUNITY): Payer: Self-pay

## 2018-10-09 ENCOUNTER — Emergency Department (HOSPITAL_COMMUNITY)
Admission: EM | Admit: 2018-10-09 | Discharge: 2018-10-09 | Disposition: A | Discharge status: Home / Self Care | Payer: Medicaid Other | Attending: Emergency Medicine | Admitting: Emergency Medicine

## 2018-10-09 ENCOUNTER — Other Ambulatory Visit: Payer: Self-pay

## 2018-10-09 DIAGNOSIS — E119 Type 2 diabetes mellitus without complications: Secondary | ICD-10-CM | POA: Insufficient documentation

## 2018-10-09 DIAGNOSIS — L03012 Cellulitis of left finger: Secondary | ICD-10-CM

## 2018-10-09 DIAGNOSIS — F17200 Nicotine dependence, unspecified, uncomplicated: Secondary | ICD-10-CM | POA: Insufficient documentation

## 2018-10-09 DIAGNOSIS — Z79899 Other long term (current) drug therapy: Secondary | ICD-10-CM | POA: Insufficient documentation

## 2018-10-09 MED ORDER — LIDOCAINE HCL 2 % IJ SOLN
10.0000 mL | Freq: Once | INTRAMUSCULAR | Status: AC
  Administered 2018-10-09: 200 mg via INTRADERMAL
  Filled 2018-10-09: qty 20

## 2018-10-09 MED ORDER — SULFAMETHOXAZOLE-TRIMETHOPRIM 800-160 MG PO TABS: 1.0000 | ORAL_TABLET | Freq: Two times a day (BID) | ORAL | 0 refills | Status: AC

## 2018-10-09 MED ORDER — SULFAMETHOXAZOLE-TRIMETHOPRIM 800-160 MG PO TABS
1.0000 | ORAL_TABLET | Freq: Once | ORAL | Status: AC
  Administered 2018-10-09: 22:00:00 1 via ORAL
  Filled 2018-10-09: qty 1

## 2018-10-09 NOTE — ED Triage Notes (Signed)
Pt complains of swelling of her left ring finger at her nail bed, she states that she had fake nails on took them off with her teeth, the swelling has been going on for three days

## 2018-10-09 NOTE — Discharge Instructions (Signed)
Take tylenol, motrin for pain   Continue augmentin, add bactrim twice daily for 5 days.   See your doctor  Use warm soaks of the finger   Do NOT bite your nails   Return to ER if you have worse finger swelling, fever, worse drainage

## 2018-10-09 NOTE — ED Notes (Signed)
Bed: WA04 Expected date:  Expected time:  Means of arrival:  Comments: 

## 2018-10-09 NOTE — ED Provider Notes (Signed)
[facility] DEPT Provider Note   CSN: [id number] Arrival date & time: 10/09/18  2057    History   Chief Complaint No chief complaint on file.   HPI [name] [name] is a 28 y.o. 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  Patient states that she took off her fake nail several days ago.  Also has been biting her nails and she noticed swelling around the nailbed several days ago.  She is on Augmentin chronically to prevent abscesses.  She does have multiple abscesses that required I&D.  She denies any IV drug use and denies being pregnant.      The history is provided by the patient.    Past Medical History:  Diagnosis Date  . 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 Diabetes mellitus    Type II  . Gonorrhea   . Hirsutism   . PCOS (polycystic ovarian syndrome)     Patient Active Problem List   Diagnosis Date Noted  . 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 Uncontrolled diabetes mellitus ([place]) 06/04/2013  . 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 PCOS (polycystic ovarian syndrome) 01/08/2013    Past Surgical History:  Procedure Laterality Date  . ABSCESS DRAINAGE    . THERAPEUTIC ABORTION    . TOOTH EXTRACTION       OB History    Gravida  3   Para  1   Term  1   Preterm  0   AB  2   Living  1     SAB  1   TAB  1   Ectopic  0   Multiple  0   Live Births  1            Home Medications    Prior to Admission medications   Medication Sig Start Date End Date Taking? Authorizing Provider  amoxicillin-clavulanate (AUGMENTIN) 875-125 MG tablet Take 1 tablet by mouth 2 (two) times daily. 09/26/18   Golden Circle, FNP  Blood Glucose Monitoring Suppl (TRUE METRIX METER) w/Device KIT Use as directed 07/28/16   Tresa Garter, MD  fluconazole (DIFLUCAN) 150 MG tablet Take 1 tablet (150 mg total) by mouth daily. 09/25/18   Comer, Okey Regal, MD  glipiZIDE (GLUCOTROL) 5 MG  tablet Take 1 tablet (5 mg total) by mouth daily before breakfast. Patient not taking: Reported on 07/18/2018 03/11/17   Alfonse Spruce, FNP  glucose blood (TRUE METRIX BLOOD GLUCOSE TEST) test strip Use as instructed Patient not taking: Reported on 07/18/2018 03/11/17   Alfonse Spruce, FNP  insulin glargine (LANTUS) 100 UNIT/ML injection Inject 0.15 mLs (15 Units total) into the skin at bedtime. Patient not taking: Reported on 07/18/2018 01/16/18   Varney Biles, MD  medroxyPROGESTERone (PROVERA) 10 MG tablet Take 1 tablet (10 mg total) by mouth daily. 10/05/18   Truett Mainland, DO  metFORMIN (GLUCOPHAGE) 500 MG tablet Take 1 tablet (500 mg total) by mouth 2 (two) times daily with a meal. Patient not taking: Reported on 07/18/2018 01/16/18   Varney Biles, MD  oseltamivir (TAMIFLU) 75 MG capsule Take 1 capsule (75 mg total) by mouth every 12 (twelve) hours. 07/25/18   Henderly, Britni A, PA-C    Family History Family History  Problem Relation Age of Onset  . Epilepsy Father   . Cancer Father        lung cancer  . COPD Maternal Grandmother   . Hypertension Maternal  Grandmother   . Heart disease Maternal Grandmother   . Cancer Paternal Grandfather     Social History Social History   Tobacco Use  . Smoking status: Current Every Day Smoker    Packs/day: 0.25    Types: Cigarettes  . Smokeless tobacco: Never Used  Substance Use Topics  . Alcohol use: Not Currently  . Drug use: No     Allergies   Lisinopril   Review of Systems Review of Systems  Musculoskeletal:       L 4th finger swelling   All other systems reviewed and are negative.    Physical Exam Updated Vital Signs BP 120/89 (BP Location: Right Arm)   Pulse 91   Temp 99 F (37.2 C) (Oral)   Resp 15   Ht 5' 7"  (1.702 m)   Wt 121.1 kg   LMP 09/25/2018   SpO2 100%   BMI 41.82 kg/m   Physical Exam Vitals signs and nursing note reviewed.  HENT:     Head: Normocephalic.     Mouth/Throat:     Mouth:  Mucous membranes are moist.  Eyes:     Pupils: Pupils are equal, round, and reactive to light.  Neck:     Musculoskeletal: Normal range of motion.  Cardiovascular:     Rate and Rhythm: Normal rate.  Pulmonary:     Effort: Pulmonary effort is normal.  Abdominal:     General: Abdomen is flat.  Musculoskeletal:     Comments: L 4th finger paronychia. No obvious felon   Skin:    General: Skin is warm.     Capillary Refill: Capillary refill takes less than 2 seconds.  Neurological:     General: No focal deficit present.     Mental Status: She is alert and oriented to person, place, and time.  Psychiatric:        Mood and Affect: Mood normal.      ED Treatments / Results  Labs (all labs ordered are listed, but only abnormal results are displayed) Labs Reviewed - No data to display  EKG None  Radiology No results found.  Procedures Procedures (including critical care time)  INCISION AND DRAINAGE Performed by: Wandra Arthurs Consent: Verbal consent obtained. Risks and benefits: risks, benefits and alternatives were discussed Type: abscess  Body area: L 4th finger paronychia   Anesthesia: local infiltration  Incision was made with a scalpel.  Local anesthetic: lidocaine 2 % no epinephrine  Anesthetic total: 5 ml  Complexity: complex Blunt dissection to break up loculations  Drainage: purulent  Drainage amount: moderate   Packing material:   Patient tolerance: Patient tolerated the procedure well with no immediate complications.     Medications Ordered in ED Medications  sulfamethoxazole-trimethoprim (BACTRIM DS,SEPTRA DS) 800-160 MG per tablet 1 tablet (has no administration in time range)  lidocaine (XYLOCAINE) 2 % (with pres) injection 200 mg (200 mg Intradermal Given 10/09/18 2116)     Initial Impression / Assessment and Plan / ED Course  I have reviewed the triage vital signs and the nursing notes.  Pertinent labs & imaging results that were  available during my care of the patient were reviewed by me and considered in my medical decision making (see chart for details).       [name] [name] is a 28 y.o. female hx of abscess here with L 4th finger paronychia. I and D performed. She is on augmentin already, will add bactrim for several days. Recommend warm soaks as well.  Told her to not bite her nails    Final Clinical Impressions(s) / ED Diagnoses   Final diagnoses:  None    ED Discharge Orders    None       Drenda Freeze, MD 10/09/18 2201

## 2018-11-29 ENCOUNTER — Other Ambulatory Visit: Payer: Self-pay

## 2018-11-29 ENCOUNTER — Encounter: Payer: Self-pay | Admitting: Internal Medicine

## 2018-11-29 ENCOUNTER — Ambulatory Visit (INDEPENDENT_AMBULATORY_CARE_PROVIDER_SITE_OTHER): Payer: Self-pay | Admitting: Internal Medicine

## 2018-11-29 DIAGNOSIS — A429 Actinomycosis, unspecified: Secondary | ICD-10-CM

## 2018-11-29 NOTE — Progress Notes (Signed)
   Subjective:    Patient ID: [name] [name], female    DOB: [birth year], 28 y.o.   MRN: [id number]  HPI I connected with  ARMENDA ETHERIDGE on 11/29/18 by phone and verified that I am speaking with the correct person using two identifiers.   I discussed the limitations of evaluation and management by telemedicine. The patient expressed understanding and agreed to proceed.  She is followed for an Actinomyces left breast abscess.  She had been off and on Augmentin that I recommended she take for about 3 months to prevent relapse.  She did have a relapse of the breast abscess after stopping initially after about 2 weeks due to a yeast infection.  Restarted it and took about 3 months.   No problems since.  No associated fever or chills.     Review of Systems     Objective:   Physical Exam        Assessment & Plan:

## 2018-11-29 NOTE — Assessment & Plan Note (Signed)
Healed, completed adequate treatment.  Follow up PRN Time spent with discussion and counseling: 8 minutes.

## 2018-12-27 ENCOUNTER — Telehealth: Payer: Self-pay | Admitting: Infectious Diseases

## 2018-12-27 NOTE — Telephone Encounter (Signed)
COVID-19 Pre-Screening Questions: ° °Do you currently have a fever (>100 °F), chills or unexplained body aches? No  ° °Are you currently experiencing new cough, shortness of breath, sore throat, runny nose?no   °•  °Have you recently travelled outside the state of [place] in the last 14 days? No   °•  °Have you been in contact with someone that is currently pending confirmation of Covid19 testing or has been confirmed to have the Covid19 virus?  No  °

## 2018-12-28 ENCOUNTER — Other Ambulatory Visit: Payer: Self-pay

## 2018-12-28 ENCOUNTER — Encounter: Payer: Self-pay | Admitting: Infectious Diseases

## 2018-12-28 ENCOUNTER — Ambulatory Visit (INDEPENDENT_AMBULATORY_CARE_PROVIDER_SITE_OTHER): Payer: Medicaid Other | Admitting: Infectious Diseases

## 2018-12-28 DIAGNOSIS — A429 Actinomycosis, unspecified: Secondary | ICD-10-CM

## 2018-12-28 DIAGNOSIS — L0293 Carbuncle, unspecified: Secondary | ICD-10-CM

## 2018-12-28 DIAGNOSIS — E1165 Type 2 diabetes mellitus with hyperglycemia: Secondary | ICD-10-CM

## 2018-12-28 NOTE — Progress Notes (Signed)
Patient: [name] [name]  DOB: [birth year] MRN: [id number] PCP: Patient, No Pcp Per    Patient Active Problem List   Diagnosis Date Noted  . 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 Uncontrolled diabetes mellitus ([place]) 06/04/2013  . 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 PCOS (polycystic ovarian syndrome) 01/08/2013     Subjective:  [name] [name] is a 28 y.o. female.  She saw Dr. Linus Salmons in consultation in January 2020 after an ER visit for a painful abscess on her breast.  She went to the ER and underwent I&D.  Initially placed on Bactrim and Keflex with culture growing out actinomyces species.  She was switched to Augmentin and referred to ID clinic for ongoing management.  She did stop taking her antibiotic a few weeks after initial drainage.  But she had a relapse and was continued on this for 3 months.  Last follow-up with Dr. Linus Salmons was about a month ago and he instructed her to stop and follow her progress off antibiotics.  Since that time she has had some ongoing clear drainage and open skin at the site of the previous abscess but no purulent or grainy drainage.  No pain at the breast or erythema.  He has unfortunately however had recurrent boils to her perineal area.  She had a very large one that was prohibiting her to sit recently.  She began taking her Augmentin again that she had leftover after Dr. Linus Salmons told her to stop as well as hot compress applications.  This has significantly improved the size of the abscess and she is able to walk and sit comfortably and wear clothing normally again.  She has been on her Augmentin twice daily for 6 days now.  No fevers no chills.  The swelling is gone down and her skin starting to itch and flake.  No purulent drainage per her report.  She says that as a teenager she suffered significant underarm abscesses that required surgery one time.  Since that surgery at the age of 4 she is never had a  recurrence.  She does not have these boils come up under the breasts or the buttocks.  She works in a hot environment out of the sun and [employer] and sweats a lot.  She is also a diabetic under very poor control.  She tells me her sugars have been upwards of 400 recently last A1c of 11%.  She is maintained on metformin and Lantus.  She does not check her sugars routinely.   Review of Systems  Constitutional: Negative for chills and fever.  Respiratory: Negative for cough.   Cardiovascular: Negative for chest pain.  Gastrointestinal: Negative for abdominal pain and diarrhea.  Genitourinary: Negative for dysuria.  Skin: Positive for rash.    Past Medical History:  Diagnosis Date  . 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 Diabetes mellitus    Type II  . Gonorrhea   . Hirsutism   . PCOS (polycystic ovarian syndrome)     Outpatient Medications Prior to Visit  Medication Sig Dispense Refill  . amoxicillin-clavulanate (AUGMENTIN) 875-125 MG tablet Take 1 tablet by mouth 2 (two) times daily. 60 tablet 2  . Blood Glucose Monitoring Suppl (TRUE METRIX METER) w/Device KIT Use as directed 1 kit 0  . glipiZIDE (GLUCOTROL) 5 MG tablet Take 1 tablet (5 mg total) by mouth daily before breakfast. 30 tablet 2  . glucose blood (TRUE METRIX BLOOD GLUCOSE TEST) test strip Use as  instructed 100 each 12  . insulin glargine (LANTUS) 100 UNIT/ML injection Inject 0.15 mLs (15 Units total) into the skin at bedtime. 10 mL 1  . metFORMIN (GLUCOPHAGE) 500 MG tablet Take 1 tablet (500 mg total) by mouth 2 (two) times daily with a meal. 60 tablet 1  . QUEtiapine (SEROQUEL) 300 MG tablet Take 300 mg by mouth at bedtime.    . fluconazole (DIFLUCAN) 150 MG tablet Take 1 tablet (150 mg total) by mouth daily. (Patient not taking: Reported on 12/28/2018) 1 tablet 3  . medroxyPROGESTERone (PROVERA) 10 MG tablet Take 1 tablet (10 mg total) by mouth daily. (Patient not taking: Reported on 12/28/2018) 30 tablet 0  . oseltamivir (TAMIFLU)  75 MG capsule Take 1 capsule (75 mg total) by mouth every 12 (twelve) hours. (Patient not taking: Reported on 12/28/2018) 10 capsule 0   No facility-administered medications prior to visit.      Allergies  Allergen Reactions  . Lisinopril Other (See Comments) and Shortness Of Breath    Chest pains Chest pains and tightness    Social History   Tobacco Use  . Smoking status: Current Every Day Smoker    Packs/day: 0.25    Types: Cigarettes  . Smokeless tobacco: Never Used  Substance Use Topics  . Alcohol use: Not Currently  . Drug use: No    Family History  Problem Relation Age of Onset  . Epilepsy Father   . Cancer Father        lung cancer  . COPD Maternal Grandmother   . Hypertension Maternal Grandmother   . Heart disease Maternal Grandmother   . Cancer Paternal Grandfather     Objective:   Vitals:   12/28/18 1048  BP: 123/75  Pulse: 78  Temp: 98.4 F (36.9 C)  TempSrc: Oral  Weight: 269 lb (122 kg)   Body mass index is 42.13 kg/m.  Physical Exam Vitals signs reviewed.  Constitutional:      Appearance: She is obese. She is not ill-appearing.  Genitourinary:    Comments: Deferred exam. Skin:    General: Skin is warm and dry.     Comments: She has a small less than 0.5 cm circular ulcer to the skin draining clear fluid.  No surrounding erythema, fluctuance or pain.  Neurological:     Mental Status: She is alert and oriented to person, place, and time.     Lab Results: Lab Results  Component Value Date   WBC 7.9 07/06/2018   HGB 12.6 07/06/2018   HCT 37.0 07/06/2018   MCV 94.5 07/06/2018   PLT 185 07/06/2018    Lab Results  Component Value Date   CREATININE 0.50 07/06/2018   BUN 8 07/06/2018   NA 139 07/06/2018   K 4.2 07/06/2018   CL 102 07/06/2018   CO2 27 03/17/2018    Lab Results  Component Value Date   ALT 15 03/17/2018   AST 13 (L) 03/17/2018   ALKPHOS 74 03/17/2018   BILITOT 0.5 03/17/2018     Assessment & Plan:   Problem  List Items Addressed This Visit      Unprioritized   Infection, actinomyces    Area where she was previously trouble with a breast abscess looks clean.  There is no signs of infection on exam.  She has large breasts and reports frequent and excessive sweating throughout the day.  I encouraged her to try to keep this area dry with a dry gauze over the ulcer to help this  heal better. No need for any further antibiotics at this time.      Recurrent boils, perineum    Again her out-of-control diabetes is not helping this problem.  We discussed modify her she can do in the home environment and with regards to hygiene to help prevent this.  She will pick up chlorhexidine soap to see if this will help prevent recurrent boils.  I asked her to please take 1 more day of the Augmentin then stop to complete 7 days as it sounds like her abscess has improved with medical management and compresses.       Uncontrolled diabetes mellitus ([place])    Most recently her hemoglobin A1c was up to 11% again.  I informed her that this means her average blood sugars above 250 all the time.  She is currently working with her primary care provider and readily admits that it is all diet that she struggles with.  She has a young child at home, high stress and eats frequently when she is bored tired or emotional.  We spent a majority of visit discussing resources regarding behavioral and lifestyle modifiers and resources that I would recommend she look into.  Discussed with her that she will continue to be at a higher risk for recurrent skin and soft tissue infections as well as heart disease, kidney disease, neuropathy and retinopathy should she continue with this course.         More than 50% of the visit was in direct face to face counsel of the patient regarding the above.     Janene Madeira, MSN, NP-C [facility] for [facility] [place] Pager: [phone] Office: [phone]  12/28/18   1:25 PM

## 2018-12-28 NOTE — Assessment & Plan Note (Signed)
Area where she was previously trouble with a breast abscess looks clean.  There is no signs of infection on exam.  She has large breasts and reports frequent and excessive sweating throughout the day.  I encouraged her to try to keep this area dry with a dry gauze over the ulcer to help this heal better. No need for any further antibiotics at this time.

## 2018-12-28 NOTE — Patient Instructions (Signed)
Your breast abscess seems to be healing up quite nicely.  I do not see any signs of infection.  Due to the moisture in this unfortunate spot it is going to take some time to heal.  Use do your best to try to keep it dry with gauze during the day.   Clear drainage is okay anything that turns white or cloudy is something I would want to know about.  For your diabetes, I would encourage you to try to use some of the resources that I view as personally listed below to help with gaining better control over your blood sugars.  Hemoglobin A1c is way too high for you and puts you at a significant risk for heart disease, kidney disease, infections and blindness.  The goal would be to try to get your A1c down below 7.  Dietdoctor.com -this is a good website that has a lot of introductory videos that can teach you about the benefits of a low carbohydrate diet and how it can correct and fix diabetes.  I encourage you to please discuss with your primary care doctor to see if they can partner with you to make sure that if you decide to make these dietary changes it is done so safely.  PodCasts:  - Weight loss for busy physicians - you don't need to be a doctor to find benefit in this one. It is free and this lady is a life coach that teaches more about strategies to correct habit/behaviors and get you in a mindset to make lifesaving changes to the way you eat.   Books: - The Diabetes Code - this is a wonderful book by a man named Sharman Cheek. Very helpful and insightful.  - The Obesity Code - this one changed my life. Same author and goes into details about the benefit of low carb diet and intermittent fasting for health.   For your Boils:  1.  Please do not use loofah O's and only use single use washcloths when you are in the shower 2.  It sounds like it is healing up I would continue your Augmentin for 1-2 more days and continue with your warm compresses. 3.  If your underwear gets wet from sweat during the day  I would bring a pair to change into at work.  Would prefer Cotton underwear versus any synthetic fibers to decrease sweating. 4.  The surgical soap that I want you to try is over-the-counter at the pharmacy called chlorhexidine.  Green bottle pink soap.  I want you to use this 3 times a week for a while to see if this helps decrease outbreaks.  And drop down to once a week thereafter and titrate the use as it you find it helpful. 5.  Warm compresses with Epsom salt are helpful for the future boils that come.  Sometimes if they get large enough drainage is necessary.  We do not do drainage here but you can seek care at your primary care or urgent care to have this done.   It was very nice to meet you.

## 2018-12-28 NOTE — Assessment & Plan Note (Signed)
Most recently her hemoglobin A1c was up to 11% again.  I informed her that this means her average blood sugars above 250 all the time.  She is currently working with her primary care provider and readily admits that it is all diet that she struggles with.  She has a young child at home, high stress and eats frequently when she is bored tired or emotional.  We spent a majority of visit discussing resources regarding behavioral and lifestyle modifiers and resources that I would recommend she look into.  Discussed with her that she will continue to be at a higher risk for recurrent skin and soft tissue infections as well as heart disease, kidney disease, neuropathy and retinopathy should she continue with this course.

## 2018-12-28 NOTE — Assessment & Plan Note (Signed)
Again her out-of-control diabetes is not helping this problem.  We discussed modify her she can do in the home environment and with regards to hygiene to help prevent this.  She will pick up chlorhexidine soap to see if this will help prevent recurrent boils.  I asked her to please take 1 more day of the Augmentin then stop to complete 7 days as it sounds like her abscess has improved with medical management and compresses.

## 2019-01-24 ENCOUNTER — Emergency Department (HOSPITAL_COMMUNITY)
Admission: EM | Admit: 2019-01-24 | Discharge: 2019-01-24 | Disposition: A | Discharge status: Home / Self Care | Payer: Commercial Managed Care - PPO | Attending: Emergency Medicine | Admitting: Emergency Medicine

## 2019-01-24 ENCOUNTER — Other Ambulatory Visit: Payer: Self-pay

## 2019-01-24 ENCOUNTER — Encounter (HOSPITAL_COMMUNITY): Payer: Self-pay | Admitting: Obstetrics and Gynecology

## 2019-01-24 DIAGNOSIS — L02213 Cutaneous abscess of chest wall: Secondary | ICD-10-CM | POA: Insufficient documentation

## 2019-01-24 DIAGNOSIS — Z5321 Procedure and treatment not carried out due to patient leaving prior to being seen by health care provider: Secondary | ICD-10-CM | POA: Diagnosis not present

## 2019-01-24 NOTE — ED Notes (Signed)
Patient called out and was mad that no one had come in to see her yet I informed her that I was busy with another patient and could no come in to see here quicker, told patient that doctor would be in to see here as soon as they can.

## 2019-01-24 NOTE — ED Triage Notes (Signed)
Pt reports between her breasts she has an abscess that developed x2 days ago and it is causing pain and irritation.

## 2019-01-24 NOTE — ED Notes (Signed)
Patient states that she was leaving because it was taking too long.

## 2019-01-25 ENCOUNTER — Encounter: Payer: Self-pay | Admitting: Infectious Diseases

## 2019-01-25 ENCOUNTER — Ambulatory Visit (INDEPENDENT_AMBULATORY_CARE_PROVIDER_SITE_OTHER): Payer: Commercial Managed Care - PPO | Admitting: Infectious Diseases

## 2019-01-25 ENCOUNTER — Other Ambulatory Visit: Payer: Self-pay

## 2019-01-25 DIAGNOSIS — N611 Abscess of the breast and nipple: Secondary | ICD-10-CM | POA: Diagnosis not present

## 2019-01-25 MED ORDER — AMOXICILLIN-POT CLAVULANATE 875-125 MG PO TABS: 1.0000 | ORAL_TABLET | Freq: Two times a day (BID) | ORAL | 0 refills | Status: DC

## 2019-01-25 NOTE — Assessment & Plan Note (Signed)
I am unable to drain the abscess in the office today.  We will have her seek care for drainage and culturing of material at urgent care.  With her history of actinomyces involving the left breast obtaining culture of drainage will help direct duration of therapy.   I will send in a prescription for Augmentin 875 twice daily for 10 days to start and follow culture results and advise thereafter with regards to how much longer she needs for treatment.   If urgent care is unable to drain may need to get her scheduled with the breast center for needle aspiration or possibly general surgery.

## 2019-01-25 NOTE — Patient Instructions (Signed)
Please go over to Urgent Care at [facility] on [facility] [address] [facility] to have your breast abscess drained. Please tell them to culture the drainage.   After this is done and we get a culture I will send in a prescription for augmentin for you to take for 10 days to start. If the same strange bacteria grows will extend it out longer.

## 2019-01-25 NOTE — Progress Notes (Signed)
Patient: [name] [name]  DOB: 04-23-1991 MRN: [id number] PCP: Patient, No Pcp Per    Patient Active Problem List   Diagnosis Date Noted  . Breast abscess 01/25/2019  . 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 Uncontrolled diabetes mellitus ([place]) 06/04/2013  . 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 PCOS (polycystic ovarian syndrome) 01/08/2013     Subjective:  [name] [name] is a 28 y.o. female.  [name] [name] is here for a new breast abscess on the right breast that started about 3 days ago now.  Is very tender and warm.  One small area started draining spontaneously.  We treated her for an actinomyces related breast infection of the left breast between January and May 2020.  I saw her back in the office in follow-up about a month ago for boils on her groin.  She completed 7-day course of Augmentin at that time with good result.  She denies any fevers or chills.  She tells me that she has been doing better with her diabetes control.  Review of Systems  Constitutional: Negative for chills and fever.  Respiratory: Negative for cough.   Cardiovascular: Negative for chest pain.  Gastrointestinal: Negative for abdominal pain and diarrhea.  Genitourinary: Negative for dysuria.  Skin: Positive for rash.    Past Medical History:  Diagnosis Date  . Cushing syndrome ([place])   . Diabetes mellitus    Type II  . Gonorrhea   . Hirsutism   . PCOS (polycystic ovarian syndrome)     Outpatient Medications Prior to Visit  Medication Sig Dispense Refill  . amoxicillin-clavulanate (AUGMENTIN) 875-125 MG tablet Take 1 tablet by mouth 2 (two) times daily. 60 tablet 2  . Blood Glucose Monitoring Suppl (TRUE METRIX METER) w/Device KIT Use as directed 1 kit 0  . fluconazole (DIFLUCAN) 150 MG tablet Take 1 tablet (150 mg total) by mouth daily. 1 tablet 3  . glipiZIDE (GLUCOTROL) 5 MG tablet Take 1 tablet (5 mg total) by mouth daily before breakfast. 30 tablet 2   . glucose blood (TRUE METRIX BLOOD GLUCOSE TEST) test strip Use as instructed 100 each 12  . insulin glargine (LANTUS) 100 UNIT/ML injection Inject 0.15 mLs (15 Units total) into the skin at bedtime. 10 mL 1  . medroxyPROGESTERone (PROVERA) 10 MG tablet Take 1 tablet (10 mg total) by mouth daily. 30 tablet 0  . metFORMIN (GLUCOPHAGE) 500 MG tablet Take 1 tablet (500 mg total) by mouth 2 (two) times daily with a meal. 60 tablet 1  . oseltamivir (TAMIFLU) 75 MG capsule Take 1 capsule (75 mg total) by mouth every 12 (twelve) hours. 10 capsule 0  . QUEtiapine (SEROQUEL) 300 MG tablet Take 300 mg by mouth at bedtime.     No facility-administered medications prior to visit.      Allergies  Allergen Reactions  . Lisinopril Other (See Comments) and Shortness Of Breath    Chest pains Chest pains and tightness    Social History   Tobacco Use  . Smoking status: Current Every Day Smoker    Packs/day: 0.25    Types: Cigarettes  . Smokeless tobacco: Never Used  Substance Use Topics  . Alcohol use: Not Currently  . Drug use: No    Family History  Problem Relation Age of Onset  . Epilepsy Father   . Cancer Father        lung cancer  . COPD Maternal Grandmother   . Hypertension Maternal Grandmother   .  Heart disease Maternal Grandmother   . Cancer Paternal Grandfather     Objective:   Vitals:   01/25/19 1557  BP: 118/77  Pulse: (!) 104  Temp: 98.9 F (37.2 C)   There is no height or weight on file to calculate BMI.  Physical Exam Vitals signs reviewed.  Constitutional:      Appearance: She is obese. She is not ill-appearing.  Skin:    General: Skin is warm and dry.     Comments: She has a about a 2 cm indurated area on the right inner breast.  It is quite tender.  Full thickness skin tear with only some bloody drainage.  I am unable to express any purulence today due to her pain.  Neurological:     Mental Status: She is alert and oriented to person, place, and time.      Lab Results: Lab Results  Component Value Date   WBC 7.9 07/06/2018   HGB 12.6 07/06/2018   HCT 37.0 07/06/2018   MCV 94.5 07/06/2018   PLT 185 07/06/2018    Lab Results  Component Value Date   CREATININE 0.50 07/06/2018   BUN 8 07/06/2018   NA 139 07/06/2018   K 4.2 07/06/2018   CL 102 07/06/2018   CO2 27 03/17/2018    Lab Results  Component Value Date   ALT 15 03/17/2018   AST 13 (L) 03/17/2018   ALKPHOS 74 03/17/2018   BILITOT 0.5 03/17/2018     Assessment & Plan:   Problem List Items Addressed This Visit      Unprioritized   Breast abscess    I am unable to drain the abscess in the office today.  We will have her seek care for drainage and culturing of material at urgent care.  With her history of actinomyces involving the left breast obtaining culture of drainage will help direct duration of therapy.   I will send in a prescription for Augmentin 875 twice daily for 10 days to start and follow culture results and advise thereafter with regards to how much longer she needs for treatment.   If urgent care is unable to drain may need to get her scheduled with the breast center for needle aspiration or possibly general surgery.         Janene Madeira, MSN, NP-C [facility] for [facility] [place] Pager: [phone] Office: [phone]  01/25/19  4:19 PM

## 2019-02-26 ENCOUNTER — Other Ambulatory Visit: Payer: Self-pay

## 2019-02-26 ENCOUNTER — Encounter (HOSPITAL_COMMUNITY): Payer: Self-pay

## 2019-02-26 ENCOUNTER — Emergency Department (HOSPITAL_COMMUNITY)
Admission: EM | Admit: 2019-02-26 | Discharge: 2019-02-26 | Disposition: A | Discharge status: Home / Self Care | Payer: Commercial Managed Care - PPO | Attending: Emergency Medicine | Admitting: Emergency Medicine

## 2019-02-26 DIAGNOSIS — R109 Unspecified abdominal pain: Secondary | ICD-10-CM | POA: Diagnosis not present

## 2019-02-26 DIAGNOSIS — N643 Galactorrhea not associated with childbirth: Secondary | ICD-10-CM | POA: Diagnosis not present

## 2019-02-26 DIAGNOSIS — E119 Type 2 diabetes mellitus without complications: Secondary | ICD-10-CM | POA: Insufficient documentation

## 2019-02-26 DIAGNOSIS — Z79899 Other long term (current) drug therapy: Secondary | ICD-10-CM | POA: Insufficient documentation

## 2019-02-26 DIAGNOSIS — F17210 Nicotine dependence, cigarettes, uncomplicated: Secondary | ICD-10-CM | POA: Diagnosis not present

## 2019-02-26 DIAGNOSIS — Z794 Long term (current) use of insulin: Secondary | ICD-10-CM | POA: Insufficient documentation

## 2019-02-26 DIAGNOSIS — R102 Pelvic and perineal pain: Secondary | ICD-10-CM | POA: Diagnosis present

## 2019-02-26 LAB — CBC
HCT: 40.1 % (ref 36.0–46.0)
Hemoglobin: 13.4 g/dL (ref 12.0–15.0)
MCH: 30.9 pg (ref 26.0–34.0)
MCHC: 33.4 g/dL (ref 30.0–36.0)
MCV: 92.6 fL (ref 80.0–100.0)
Platelets: 204 10*3/uL (ref 150–400)
RBC: 4.33 MIL/uL (ref 3.87–5.11)
RDW: 11.8 % (ref 11.5–15.5)
WBC: 7.2 10*3/uL (ref 4.0–10.5)
nRBC: 0 % (ref 0.0–0.2)

## 2019-02-26 LAB — URINALYSIS, ROUTINE W REFLEX MICROSCOPIC
Bacteria, UA: NONE SEEN
Bilirubin Urine: NEGATIVE
Glucose, UA: >=500 mg/dL — AB
Hgb urine dipstick: NEGATIVE
Ketones, ur: NEGATIVE mg/dL
Leukocytes,Ua: NEGATIVE
Nitrite: NEGATIVE
Protein, ur: NEGATIVE mg/dL
Specific Gravity, Urine: 1.03 (ref 1.005–1.030)
pH: 7 (ref 5.0–8.0)

## 2019-02-26 LAB — COMPREHENSIVE METABOLIC PANEL
ALT: 12 U/L (ref 0–44)
AST: 9 U/L — ABNORMAL LOW (ref 15–41)
Albumin: 3.6 g/dL (ref 3.5–5.0)
Alkaline Phosphatase: 83 U/L (ref 38–126)
Anion gap: 12 (ref 5–15)
BUN: 8 mg/dL (ref 6–20)
CO2: 23 mmol/L (ref 22–32)
Calcium: 9 mg/dL (ref 8.9–10.3)
Chloride: 102 mmol/L (ref 98–111)
Creatinine, Ser: 0.7 mg/dL (ref 0.44–1.00)
GFR calc Af Amer: >60 mL/min (ref >60)
GFR calc non Af Amer: >60 mL/min (ref >60)
Glucose, Bld: 421 mg/dL — ABNORMAL HIGH (ref 70–99)
Potassium: 3.7 mmol/L (ref 3.5–5.1)
Sodium: 137 mmol/L (ref 135–145)
Total Bilirubin: 0.6 mg/dL (ref 0.3–1.2)
Total Protein: 7.3 g/dL (ref 6.5–8.1)

## 2019-02-26 LAB — I-STAT BETA HCG BLOOD, ED (MC, WL, AP ONLY): I-stat hCG, quantitative: <5 m[IU]/mL (ref <5)

## 2019-02-26 LAB — LIPASE, BLOOD: Lipase: 32 U/L (ref 11–51)

## 2019-02-26 MED ORDER — SODIUM CHLORIDE 0.9% FLUSH: 3.0000 mL | Freq: Once | INTRAVENOUS | Status: DC

## 2019-02-26 NOTE — ED Triage Notes (Signed)
Pt reports abd cramping for 2 days, nipple discharge and intermittent nausea. Pt took home pregnancy test that was negative.

## 2019-02-26 NOTE — Discharge Instructions (Signed)
You were seen in the ER for abdominal cramping and nipple discharge.  Both blood and urine pregnancy test are negative.  Blood work was normal.  You have no other symptoms to suggest GI process, UTI.  The cause of your symptoms is unclear.  A recommend alternating ibuprofen and Tylenol over the next 2 to 3 days.  Follow-up with OB/GYN for further discussion of your symptoms.  Return to the ER if there is fever greater than 100, vomiting, worsening abdominal pain, localized abdominal pain to the right upper or right lower quadrant, burning with urination, abnormal vaginal discharge or bleeding.  The cause of your nipple discharge is unclear.  This could be from many reasons including hormone imbalances, medications.  Further evaluation and discussion needs to be done by OB/GYN.  Call them and schedule an appointment.  Return to the ER for fever, breast swelling redness warmth or pain

## 2019-02-26 NOTE — ED Notes (Signed)
Patient verbalizes understanding of discharge instructions. Opportunity for questioning and answers were provided. Armband removed by staff, pt discharged from ED.  

## 2019-02-26 NOTE — ED Provider Notes (Signed)
[facility] EMERGENCY DEPARTMENT Provider Note   CSN: [id number] Arrival date & time: 02/26/19  1439     History   Chief Complaint Chief Complaint  Patient presents with  . Abdominal Pain    HPI [name] [name] is a 28 y.o. female with history of Cushing syndrome, diabetes, PCOS, depression presents to the ER for evaluation of bilateral nipple discharge and lower abdominal cramping.  She started having constant, suprapubic abdominal cramping 2 days ago that is nonradiating.  Has associated nausea and increased hunger in the mornings, bilateral white milky nipple discharge.  Patient states last time she had nipple discharge she was pregnant with her son.  She is currently on Nexplanon and states her period stopped 3 months ago.  She still sexually active with one female partner without condom use.  She has taken several pregnancy tests in the last week and they have all been negative.  She denies any fevers, chills, vomiting, dysuria, hematuria, abnormal vaginal discharge, diarrhea, constipation.  Her nipple discharge is described as white, mild, bilateral.  She has no associated breast tenderness, breast edema erythema warmth.  She has history of Cushing's syndrome and PCOS but does not take any medicines for this.     HPI  Past Medical History:  Diagnosis Date  . 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 Diabetes mellitus    Type II  . Gonorrhea   . Hirsutism   . PCOS (polycystic ovarian syndrome)     Patient Active Problem List   Diagnosis Date Noted  . Breast abscess 01/25/2019  . 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 Uncontrolled diabetes mellitus ([place]) 06/04/2013  . 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 PCOS (polycystic ovarian syndrome) 01/08/2013    Past Surgical History:  Procedure Laterality Date  . ABSCESS DRAINAGE    . THERAPEUTIC ABORTION    . TOOTH EXTRACTION       OB History    Gravida  3   Para  1   Term  1    Preterm  0   AB  2   Living  1     SAB  1   TAB  1   Ectopic  0   Multiple  0   Live Births  1            Home Medications    Prior to Admission medications   Medication Sig Start Date End Date Taking? Authorizing Provider  amoxicillin-clavulanate (AUGMENTIN) 875-125 MG tablet Take 1 tablet by mouth 2 (two) times daily. 01/25/19   [place] Callas, NP  Blood Glucose Monitoring Suppl (TRUE METRIX METER) w/Device KIT Use as directed 07/28/16   Tresa Garter, MD  fluconazole (DIFLUCAN) 150 MG tablet Take 1 tablet (150 mg total) by mouth daily. 09/25/18   Comer, Okey Regal, MD  glipiZIDE (GLUCOTROL) 5 MG tablet Take 1 tablet (5 mg total) by mouth daily before breakfast. 03/11/17   Alfonse Spruce, FNP  glucose blood (TRUE METRIX BLOOD GLUCOSE TEST) test strip Use as instructed 03/11/17   Alfonse Spruce, FNP  insulin glargine (LANTUS) 100 UNIT/ML injection Inject 0.15 mLs (15 Units total) into the skin at bedtime. 01/16/18   Varney Biles, MD  medroxyPROGESTERone (PROVERA) 10 MG tablet Take 1 tablet (10 mg total) by mouth daily. 10/05/18   Truett Mainland, DO  metFORMIN (GLUCOPHAGE) 500 MG tablet Take 1 tablet (500 mg total) by mouth 2 (two) times daily with a  meal. 01/16/18   Varney Biles, MD  oseltamivir (TAMIFLU) 75 MG capsule Take 1 capsule (75 mg total) by mouth every 12 (twelve) hours. 07/25/18   Henderly, Britni A, PA-C  QUEtiapine (SEROQUEL) 300 MG tablet Take 300 mg by mouth at bedtime.    [provider]    Family History Family History  Problem Relation Age of Onset  . Epilepsy Father   . Cancer Father        lung cancer  . COPD Maternal Grandmother   . Hypertension Maternal Grandmother   . Heart disease Maternal Grandmother   . Cancer Paternal Grandfather     Social History Social History   Tobacco Use  . Smoking status: Current Every Day Smoker    Packs/day: 0.25    Types: Cigarettes  . Smokeless tobacco: Never Used  Substance  Use Topics  . Alcohol use: Not Currently  . Drug use: No     Allergies   Lisinopril   Review of Systems Review of Systems  Constitutional:       Nipple discharge   Gastrointestinal: Positive for nausea.  Genitourinary: Positive for pelvic pain (cramping).  All other systems reviewed and are negative.    Physical Exam Updated Vital Signs BP (!) 156/92   Pulse 68   Temp 98.8 F (37.1 C) (Oral)   Resp 16   SpO2 99%   Physical Exam Vitals signs and nursing note reviewed.  Constitutional:      Appearance: She is well-developed.     Comments: Non toxic in NAD  HENT:     Head: Normocephalic and atraumatic.     Nose: Nose normal.  Eyes:     Conjunctiva/sclera: Conjunctivae normal.  Neck:     Musculoskeletal: Normal range of motion.  Cardiovascular:     Rate and Rhythm: Normal rate and regular rhythm.  Pulmonary:     Effort: Pulmonary effort is normal.     Breath sounds: Normal breath sounds.  Chest:     Breasts:        Left: Nipple discharge present.     Comments: Right breast: Breast exam is normal.  There is no breast tenderness, fluctuance, erythema, warmth, induration.  Skin over the right breast and nipple normal.  No right axillary lymphadenopathy.  No right nipple discharge expressed.  Left breast: No breast tenderness, fluctuance, erythema, warmth or induration.  Skin over the left breast and nipple normal.  No left axillary lymphadenopathy.  There is scant white left nipple discharge with pressure.  No bleeding. Abdominal:     General: Bowel sounds are normal.     Palpations: Abdomen is soft.     Tenderness: There is no abdominal tenderness.     Comments: No G/R/R. No suprapubic or CVA tenderness. Negative Murphy's and McBurney's. Active BS to lower quadrants.   Musculoskeletal: Normal range of motion.  Skin:    General: Skin is warm and dry.     Capillary Refill: Capillary refill takes less than 2 seconds.  Neurological:     Mental Status: She is alert.   Psychiatric:        Behavior: Behavior normal.      ED Treatments / Results  Labs (all labs ordered are listed, but only abnormal results are displayed) Labs Reviewed  COMPREHENSIVE METABOLIC PANEL - Abnormal; Notable for the following components:      Result Value   Glucose, Bld 421 (*)    AST 9 (*)    All other components within normal  limits  URINALYSIS, ROUTINE W REFLEX MICROSCOPIC - Abnormal; Notable for the following components:   Color, Urine STRAW (*)    Glucose, UA >=500 (*)    All other components within normal limits  LIPASE, BLOOD  CBC  I-STAT BETA HCG BLOOD, ED ([facility], [facility], [facility] ONLY)    EKG None  Radiology No results found.  Procedures Procedures (including critical care time)  Medications Ordered in ED Medications  sodium chloride flush (NS) 0.9 % injection 3 mL (3 mLs Intravenous Not Given 02/26/19 1608)     Initial Impression / Assessment and Plan / ED Course  I have reviewed the triage vital signs and the nursing notes.  Pertinent labs & imaging results that were available during my care of the patient were reviewed by me and considered in my medical decision making (see chart for details).  28 year old is here for abdominal cramping and bilateral nipple discharge.  She is concerned about pregnancy because in the past she had nipple discharge during her pregnancy.  Currently on Nexplanon.  She has had several negative pregnancy test at home.  History of Cushing's syndrome, diabetes, PCOS.  No vaginal symptoms. No urinary symptoms. No vomiting, diarrhea, constipation.  Exam is benign.  She has no abdominal tenderness.  Afebrile.  Left nipple milky discharge expressed on exam.  No signs of breast abscess, cellulitis.  ER work-up initiated in triage, reviewed by me is essentially normal.  Hyperglycemia noted without anion gap in setting of known diabetes that is poorly controlled.  Negative urine and hCG pregnancy test.  Given her lack of urinary, vaginal,  GI symptoms and no abdominal tenderness on exam I have low suspicion for acute intra-abdominal/pelvic pathology such as appendicitis, cholecystitis, pancreatitis, SBO, PID, UTI.  She has no vaginal complaints and I do not think a pelvic exam is indicated.  I recommended ibuprofen/acetaminophen and close monitoring of her symptoms.  Return precautions were discussed and patient is comfortable with this.  Regards to the nipple discharge, this appears to be benign.  Clinically there is no signs of abscess, cellulitis.  This could be related to abnormal prolactin levels, she is on some SSRIs which could be contributing.  She has known Cushing's syndrome, diabetes and PCOS.  I do not think emergent ultrasound, imaging or work-up is indicated now.  I explained this to patient and recommended that she follow-up with OB/GYN/endocrinology for further evaluation.  Return precautions given.  Patient is comfortable with this.    Final Clinical Impressions(s) / ED Diagnoses   Final diagnoses:  Abdominal cramping  Galactorrhea    ED Discharge Orders    None       Arlean Hopping 02/26/19 1950    Tegeler, Gwenyth Allegra, MD 02/26/19 2337

## 2019-03-08 ENCOUNTER — Telehealth: Payer: Self-pay | Admitting: Obstetrics and Gynecology

## 2019-03-08 ENCOUNTER — Telehealth: Payer: Self-pay | Admitting: *Deleted

## 2019-03-08 NOTE — Telephone Encounter (Signed)
Opened in error

## 2019-03-08 NOTE — Telephone Encounter (Signed)
Called the patient in regards to request of an IUD removal from the wait list. The patient stated she would like the removal because she does not have a cycle any longer. She stated she would like a different birth control the same day without the hormones. Informed the patient of depending on what she would like we can possible complete a different method the same day.

## 2019-03-27 ENCOUNTER — Ambulatory Visit: Payer: Commercial Managed Care - PPO | Admitting: Student

## 2019-04-01 ENCOUNTER — Emergency Department (HOSPITAL_COMMUNITY): Payer: Commercial Managed Care - PPO

## 2019-04-01 ENCOUNTER — Emergency Department (HOSPITAL_COMMUNITY)
Admission: EM | Admit: 2019-04-01 | Discharge: 2019-04-01 | Discharge status: Against Medical Advice | Payer: Commercial Managed Care - PPO | Attending: Emergency Medicine | Admitting: Emergency Medicine

## 2019-04-01 ENCOUNTER — Other Ambulatory Visit: Payer: Self-pay

## 2019-04-01 ENCOUNTER — Encounter (HOSPITAL_COMMUNITY): Payer: Self-pay | Admitting: Emergency Medicine

## 2019-04-01 DIAGNOSIS — R0789 Other chest pain: Secondary | ICD-10-CM | POA: Diagnosis present

## 2019-04-01 DIAGNOSIS — Z5321 Procedure and treatment not carried out due to patient leaving prior to being seen by health care provider: Secondary | ICD-10-CM | POA: Insufficient documentation

## 2019-04-01 LAB — BASIC METABOLIC PANEL
Anion gap: 10 (ref 5–15)
BUN: 12 mg/dL (ref 6–20)
CO2: 23 mmol/L (ref 22–32)
Calcium: 9.3 mg/dL (ref 8.9–10.3)
Chloride: 103 mmol/L (ref 98–111)
Creatinine, Ser: 0.62 mg/dL (ref 0.44–1.00)
GFR calc Af Amer: >60 mL/min (ref >60)
GFR calc non Af Amer: >60 mL/min (ref >60)
Glucose, Bld: 406 mg/dL — ABNORMAL HIGH (ref 70–99)
Potassium: 4.2 mmol/L (ref 3.5–5.1)
Sodium: 136 mmol/L (ref 135–145)

## 2019-04-01 LAB — CBC
HCT: 41 % (ref 36.0–46.0)
Hemoglobin: 13.7 g/dL (ref 12.0–15.0)
MCH: 31.5 pg (ref 26.0–34.0)
MCHC: 33.4 g/dL (ref 30.0–36.0)
MCV: 94.3 fL (ref 80.0–100.0)
Platelets: 233 10*3/uL (ref 150–400)
RBC: 4.35 MIL/uL (ref 3.87–5.11)
RDW: 11.9 % (ref 11.5–15.5)
WBC: 7.7 10*3/uL (ref 4.0–10.5)
nRBC: 0 % (ref 0.0–0.2)

## 2019-04-01 LAB — I-STAT BETA HCG BLOOD, ED (NOT ORDERABLE): I-stat hCG, quantitative: <5 m[IU]/mL (ref <5)

## 2019-04-01 LAB — CBG MONITORING, ED: Glucose-Capillary: 371 mg/dL — ABNORMAL HIGH (ref 70–99)

## 2019-04-01 LAB — TROPONIN I (HIGH SENSITIVITY): Troponin I (High Sensitivity): 3 ng/L (ref <18)

## 2019-04-01 NOTE — ED Triage Notes (Signed)
Pt c/o chest pains in center of chest that have been intermittent since yesterday. reports blood sugars been in 300s when with taking her medications.

## 2019-04-01 NOTE — ED Notes (Signed)
Pt called from the lobby with no response x3 

## 2019-04-20 ENCOUNTER — Encounter: Payer: Self-pay | Admitting: Family Medicine

## 2019-04-20 ENCOUNTER — Ambulatory Visit (INDEPENDENT_AMBULATORY_CARE_PROVIDER_SITE_OTHER): Payer: Commercial Managed Care - PPO | Admitting: Family Medicine

## 2019-04-20 ENCOUNTER — Other Ambulatory Visit: Payer: Self-pay

## 2019-04-20 VITALS — BP 116/80 | HR 82 | Ht 67.0 in | Wt 267.0 lb

## 2019-04-20 DIAGNOSIS — N898 Other specified noninflammatory disorders of vagina: Secondary | ICD-10-CM

## 2019-04-20 DIAGNOSIS — B373 Candidiasis of vulva and vagina: Secondary | ICD-10-CM | POA: Diagnosis not present

## 2019-04-20 DIAGNOSIS — B3731 Acute candidiasis of vulva and vagina: Secondary | ICD-10-CM

## 2019-04-20 DIAGNOSIS — Z113 Encounter for screening for infections with a predominantly sexual mode of transmission: Secondary | ICD-10-CM | POA: Diagnosis not present

## 2019-04-20 DIAGNOSIS — N764 Abscess of vulva: Secondary | ICD-10-CM | POA: Diagnosis not present

## 2019-04-20 HISTORY — DX: Other specified noninflammatory disorders of vagina: N89.8

## 2019-04-20 HISTORY — DX: Candidiasis of vulva and vagina: B37.3

## 2019-04-20 HISTORY — DX: Acute candidiasis of vulva and vagina: B37.31

## 2019-04-20 MED ORDER — DOXYCYCLINE HYCLATE 50 MG PO CAPS: 100.0000 mg | ORAL_CAPSULE | Freq: Two times a day (BID) | ORAL | 0 refills | Status: DC

## 2019-04-20 MED ORDER — FLUCONAZOLE 150 MG PO TABS: 150.0000 mg | ORAL_TABLET | Freq: Once | ORAL | 0 refills | Status: AC

## 2019-04-20 MED ORDER — SULFAMETHOXAZOLE-TRIMETHOPRIM 800-160 MG PO TABS: 1.0000 | ORAL_TABLET | Freq: Two times a day (BID) | ORAL | 0 refills | Status: AC

## 2019-04-20 NOTE — Assessment & Plan Note (Signed)
Accepts Gon/Chl/HIV/RPR testing today.

## 2019-04-20 NOTE — Progress Notes (Signed)
GYNECOLOGY OFFICE VISIT NOTE  History:   [name] [name] is a 28 y.o. [phone] here today for multiple issues.  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 Was given Augmentin x7d and chlorhexidine soap Has run out of chlorhexidine   #Vaginal dryness Reports dryness with intercourse  Would like to know options are available  Past Medical History:  Diagnosis Date  . Cushing syndrome ([facility])   . Diabetes mellitus    Type II  . Gonorrhea   . Hirsutism   . PCOS (polycystic ovarian syndrome)     Past Surgical History:  Procedure Laterality Date  . ABSCESS DRAINAGE    . THERAPEUTIC ABORTION    . TOOTH EXTRACTION      The following portions of the patient's history were reviewed and updated as appropriate: allergies, current medications, past family history, past medical history, past social history, past surgical history and problem list.   Health Maintenance:  Normal pap 07/13/2018.   Review of Systems:  Pertinent items noted in HPI and remainder of comprehensive ROS otherwise negative.  Physical Exam:  BP 116/80   Pulse 82   Ht 5\' 7"  (1.702 m)   Wt 267 lb (121.1 kg)   BMI 41.82 kg/m  CONSTITUTIONAL: Well-developed, well-nourished female in no acute distress.  HEENT:  Normocephalic, atraumatic. External right and left ear normal. No scleral icterus.  NECK: Normal range of motion, supple, no masses noted on observation SKIN: No  rash noted. Not diaphoretic. No erythema. No pallor. MUSCULOSKELETAL: Normal range of motion. No edema noted. NEUROLOGIC: Alert and oriented to person, place, and time. Normal muscle tone coordination.  PSYCHIATRIC: Normal mood and affect. Normal behavior. Normal judgment and thought content. RESPIRATORY: Effort normal, no problems with respiration noted ABDOMEN: No masses noted. No other overt distention noted.   PELVIC: R mid-labia with ~3cm mass with mild fluctuance and central punctum draining bloody purulent fluid, tender to palpation, no significant erythema or induration  Labs and Imaging No results found for this or any previous visit (from the past 168 hour(s)). Dg Chest 2 View  Result Date: 04/01/2019 CLINICAL DATA:  Intermittent pain in center of chest which began yesterday. EXAM: CHEST - 2 VIEW COMPARISON:  10/14/2014 FINDINGS: The heart size and mediastinal contours are within normal limits. Both lungs are clear. The visualized skeletal structures are unremarkable. IMPRESSION: No active cardiopulmonary disease. Electronically Signed   By: 12/14/2014 M.D.   On: 04/01/2019 18:36      Assessment and Plan:   Problem List Items Addressed This Visit      Genitourinary   Yeast vaginitis    Symptoms c/w yeast vaginitis, rx sent for diflucan. Swab also obtained to screen for other vaginitis.      Relevant Medications   fluconazole (DIFLUCAN) 150 MG tablet   sulfamethoxazole-trimethoprim (BACTRIM DS) 800-160 MG tablet   Labial abscess    R labial abscess on exam already spontaneously draining, likely secondary to uncontrolled DM (  reports sugars regularly in low 200's). Also has unusual hx of actinomyces infection, but low suspicion for this. Offered I&D in office but patient declines, plans to soak and do warm compresses.  Recommended course of antibiotics, accepts course of Bactrim.   Wound culture obtained.  Scheduled for follow up in 3 days to ensure resolving, reviewed  warning signs for ED including fever, n/v, chills, significant enlargement of lesion, other systemic signs and symptoms.       Relevant Medications   sulfamethoxazole-trimethoprim (BACTRIM DS) 800-160 MG tablet   Other Relevant Orders   Wound culture   Vaginal dryness    Discussed conservative measures with patient, foreplay, lubricants. Infection and DM also likely playing a role.         Other   Screening for STD (sexually transmitted disease) - Primary    Accepts Gon/Chl/HIV/RPR testing today.       Relevant Orders   HIV Antibody (routine testing w rflx)   RPR   Cervicovaginal ancillary only( [place])      Routine preventative health maintenance measures emphasized. Please refer to After Visit Summary for other counseling recommendations.   Return in about 3 days (around 04/23/2019), or follow up labial abscess.    Total face-to-face time with patient: 25 minutes.  Over 50% of encounter was spent on counseling and coordination of care.   Augustin Coupe, [place] for [employer], [place]

## 2019-04-20 NOTE — Assessment & Plan Note (Signed)
Discussed conservative measures with patient, foreplay, lubricants. Infection and DM also likely playing a role.

## 2019-04-20 NOTE — Progress Notes (Signed)
Patient presents for Annual Exam   LMP: No cycles with Nexplanon.  Last Pap: 07/13/2018 WNL  Contraception: Nexplanon However, wants to discuss other options.  STD Screening: Full Panel  No Family hx of Breast Cancer   CC: Possible BV infection onset a few ago per pt.

## 2019-04-20 NOTE — Assessment & Plan Note (Signed)
Symptoms c/w yeast vaginitis, rx sent for diflucan. Swab also obtained to screen for other vaginitis.

## 2019-04-20 NOTE — Assessment & Plan Note (Addendum)
R labial abscess on exam already spontaneously draining, likely secondary to uncontrolled DM (reports sugars regularly in low 200's). Also has unusual hx of actinomyces infection, but low suspicion for this. Offered I&D in office but patient declines, plans to soak and do warm compresses.  Recommended course of antibiotics, accepts course of Bactrim.   Wound culture obtained.  Scheduled for follow up in 3 days to ensure resolving, reviewed warning signs for ED including fever, n/v, chills, significant enlargement of lesion, other systemic signs and symptoms.

## 2019-04-23 ENCOUNTER — Ambulatory Visit: Payer: Commercial Managed Care - PPO | Admitting: Obstetrics and Gynecology

## 2019-04-23 DIAGNOSIS — Z09 Encounter for follow-up examination after completed treatment for conditions other than malignant neoplasm: Secondary | ICD-10-CM

## 2019-04-24 LAB — WOUND CULTURE

## 2019-04-25 ENCOUNTER — Other Ambulatory Visit: Payer: Self-pay

## 2019-04-25 ENCOUNTER — Encounter (HOSPITAL_COMMUNITY): Payer: Self-pay | Admitting: Emergency Medicine

## 2019-04-25 ENCOUNTER — Emergency Department (HOSPITAL_COMMUNITY)
Admission: EM | Admit: 2019-04-25 | Discharge: 2019-04-25 | Disposition: A | Discharge status: Home / Self Care | Payer: Commercial Managed Care - PPO | Attending: Emergency Medicine | Admitting: Emergency Medicine

## 2019-04-25 DIAGNOSIS — K5900 Constipation, unspecified: Secondary | ICD-10-CM | POA: Insufficient documentation

## 2019-04-25 DIAGNOSIS — Z7984 Long term (current) use of oral hypoglycemic drugs: Secondary | ICD-10-CM | POA: Diagnosis not present

## 2019-04-25 DIAGNOSIS — Z79899 Other long term (current) drug therapy: Secondary | ICD-10-CM | POA: Insufficient documentation

## 2019-04-25 DIAGNOSIS — F17210 Nicotine dependence, cigarettes, uncomplicated: Secondary | ICD-10-CM | POA: Insufficient documentation

## 2019-04-25 DIAGNOSIS — E119 Type 2 diabetes mellitus without complications: Secondary | ICD-10-CM | POA: Diagnosis not present

## 2019-04-25 LAB — CERVICOVAGINAL ANCILLARY ONLY
Bacterial Vaginitis (gardnerella): POSITIVE — AB
Candida Glabrata: POSITIVE — AB
Candida Vaginitis: POSITIVE — AB
Chlamydia: NEGATIVE
Comment: NEGATIVE
Comment: NEGATIVE
Comment: NEGATIVE
Comment: NEGATIVE
Comment: NEGATIVE
Comment: NORMAL
Neisseria Gonorrhea: NEGATIVE
Trichomonas: NEGATIVE

## 2019-04-25 MED ORDER — ONDANSETRON 4 MG PO TBDP
4.0000 mg | ORAL_TABLET | Freq: Once | ORAL | Status: AC
  Administered 2019-04-25: 4 mg via ORAL
  Filled 2019-04-25: qty 1

## 2019-04-25 MED ORDER — POLYETHYLENE GLYCOL 3350 17 G PO PACK: 17.0000 g | PACK | Freq: Every day | ORAL | 0 refills | Status: DC

## 2019-04-25 MED ORDER — SORBITOL 70 % SOLN
960.0000 mL | TOPICAL_OIL | Freq: Once | ORAL | Status: AC
  Administered 2019-04-25: 960 mL via RECTAL
  Filled 2019-04-25: qty 473

## 2019-04-25 NOTE — ED Notes (Signed)
Two RNs attempted to start IV. Unsuccessful.  IV team requested.

## 2019-04-25 NOTE — ED Notes (Signed)
Pt back in bed. Pt states she feels so much better. Pt denies nausea and vomiting at this time and feels she is ready to go home

## 2019-04-25 NOTE — ED Notes (Signed)
Pt on bedside commode. Pt able to have BM after receiving partial enema. Pt also vomiting at this time.

## 2019-04-25 NOTE — Discharge Instructions (Signed)
Take miralax as prescribe to decrease risk of constipation.  Stay hydrated, eat food high in fiber.  Return if you have any concerns.

## 2019-04-25 NOTE — ED Triage Notes (Signed)
Pt complaining of constipation and had a BM on Saturday, but having lots of abd pain. States she has taken stool softeners and laxative and she is still not having a BM. Having nausea and vomiting.

## 2019-04-25 NOTE — ED Provider Notes (Signed)
[facility] EMERGENCY DEPARTMENT Provider Note   CSN: [id number] Arrival date & time: 04/25/19  0604     History   Chief Complaint No chief complaint on file.   HPI [name] [name] is a 28 y.o. female.     The history is provided by the patient. No language interpreter was used.     28 year old female with history of diabetes, PCOS, presenting with complaints of constipation.  Patient report for the past 24 hours she has been trying to have a bowel movement without any success.  States that she feels constipated, having rectal pain from the constipation and report pain of moderate in severity.  She has tried Vaseline without any relief.  She does not complain of any significant abdominal pain but does endorse 1 episodes of nausea and vomiting prior to arrival.  No associated fever or chills no chest pain shortness of breath no dysuria.  No bowel incontinence, no prior abdominal surgeries.  Normally have bowel movement every other day.  She has been noticing increasing constipation.  Past Medical History:  Diagnosis Date  . 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 Diabetes mellitus    Type II  . Gonorrhea   . Hirsutism   . PCOS (polycystic ovarian syndrome)     Patient Active Problem List   Diagnosis Date Noted  . Screening for STD (sexually transmitted disease) 04/20/2019  . Yeast vaginitis 04/20/2019  . Labial abscess 04/20/2019  . Vaginal dryness 04/20/2019  . Breast abscess 01/25/2019  . 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 Uncontrolled diabetes mellitus ([place]) 06/04/2013  . Cushings syndrome ([place]) 01/08/2013  . PCOS (polycystic ovarian syndrome) 01/08/2013    Past Surgical History:  Procedure Laterality Date  . ABSCESS DRAINAGE    . THERAPEUTIC ABORTION    . TOOTH EXTRACTION       OB History    Gravida  3   Para  1   Term  1   Preterm  0   AB  2   Living  1     SAB  1   TAB  1   Ectopic  0   Multiple  0   Live Births  1            Home Medications    Prior to Admission medications   Medication Sig Start Date End Date Taking? Authorizing Provider  Blood Glucose Monitoring Suppl (TRUE METRIX METER) w/Device KIT Use as directed 07/28/16   Tresa Garter, MD  glipiZIDE (GLUCOTROL) 5 MG tablet Take 1 tablet (5 mg total) by mouth daily before breakfast. Patient not taking: Reported on 04/20/2019 03/11/17   Alfonse Spruce, FNP  glucose blood (TRUE METRIX BLOOD GLUCOSE TEST) test strip Use as instructed 03/11/17   Alfonse Spruce, FNP  insulin glargine (LANTUS) 100 UNIT/ML injection Inject 0.15 mLs (15 Units total) into the skin at bedtime. 01/16/18   Varney Biles, MD  medroxyPROGESTERone (PROVERA) 10 MG tablet Take 1 tablet (10 mg total) by mouth daily. Patient not taking: Reported on 04/20/2019 10/05/18   Truett Mainland, DO  metFORMIN (GLUCOPHAGE) 500 MG tablet Take 1 tablet (500 mg total) by mouth 2 (two) times daily with a meal. Patient not taking: Reported on 04/20/2019 01/16/18   Varney Biles, MD  oseltamivir (TAMIFLU) 75 MG capsule Take 1 capsule (75 mg total) by mouth every 12 (twelve) hours. Patient not taking: Reported on 04/20/2019 07/25/18   Henderly, Britni A,  PA-C  QUEtiapine (SEROQUEL) 300 MG tablet Take 300 mg by mouth at bedtime.    [provider]  sulfamethoxazole-trimethoprim (BACTRIM DS) 800-160 MG tablet Take 1 tablet by mouth 2 (two) times daily for 5 days. 04/20/19 04/25/19  Clarnce Flock, MD    Family History Family History  Problem Relation Age of Onset  . Epilepsy Father   . Cancer Father        lung cancer  . COPD Maternal Grandmother   . Hypertension Maternal Grandmother   . Heart disease Maternal Grandmother   . Cancer Paternal Grandfather     Social History Social History   Tobacco Use  . Smoking status: Current Every Day Smoker    Packs/day: 0.25    Types: Cigarettes  . Smokeless tobacco: Never Used   Substance Use Topics  . Alcohol use: Not Currently  . Drug use: No     Allergies   Lisinopril   Review of Systems Review of Systems  All other systems reviewed and are negative.    Physical Exam Updated Vital Signs BP 126/79 (BP Location: Left Arm)   Pulse 77   Temp 98.4 F (36.9 C) (Oral)   Resp 20   Ht 5' 7"  (1.702 m)   Wt 121.1 kg   SpO2 100%   BMI 41.82 kg/m   Physical Exam Vitals signs and nursing note reviewed.  Constitutional:      General: She is not in acute distress.    Appearance: She is well-developed.  HENT:     Head: Atraumatic.  Eyes:     Conjunctiva/sclera: Conjunctivae normal.  Neck:     Musculoskeletal: Neck supple.  Cardiovascular:     Rate and Rhythm: Normal rate and regular rhythm.     Pulses: Normal pulses.     Heart sounds: Normal heart sounds.  Pulmonary:     Effort: Pulmonary effort is normal.     Breath sounds: Normal breath sounds.  Abdominal:     General: There is no distension.     Palpations: Abdomen is soft.     Tenderness: There is no abdominal tenderness.  Genitourinary:    Comments: Chaperone present during exam.  Large hard stool ball noted at rectum on digital rectal exam, no obvious mass, normal color stool on glove.  Exam is limited due to patient's discomfort. Skin:    Findings: No rash.  Neurological:     Mental Status: She is alert.  Psychiatric:        Mood and Affect: Mood normal.      ED Treatments / Results  Labs (all labs ordered are listed, but only abnormal results are displayed) Labs Reviewed  POC URINE PREG, ED    EKG None  Radiology No results found.  Procedures Procedures (including critical care time)  Medications Ordered in ED Medications  sorbitol, milk of mag, mineral oil, glycerin (SMOG) enema (has no administration in time range)     Initial Impression / Assessment and Plan / ED Course  I have reviewed the triage vital signs and the nursing notes.  Pertinent labs &  imaging results that were available during my care of the patient were reviewed by me and considered in my medical decision making (see chart for details).        BP 107/72 (BP Location: Right Arm)   Pulse 75   Temp 98.4 F (36.9 C) (Oral)   Resp 16   Ht 5' 7"  (1.702 m)   Wt 121.1 kg  SpO2 100%   BMI 41.82 kg/m    Final Clinical Impressions(s) / ED Diagnoses   Final diagnoses:  Constipation, unspecified constipation type    ED Discharge Orders         Ordered    polyethylene glycol (MIRALAX / GLYCOLAX) 17 g packet  Daily     04/25/19 0925         7:17 AM Patient here with constipation.  On exam, patient does have evidence of stool impaction on exam however patient did not tolerate rectal examination and opted for enema for relief.  Will provide smog enema.  8:16 AM Nurse report patient had a large bowel movement after receiving enema and felt much better.  9:24 AM Patient tolerates p.o., request to be discharged.  She is stable for discharge.  Return precaution discussed.   Domenic Moras, PA-C 04/25/19 [place], [place], DO 04/25/19 [phone]

## 2019-04-26 ENCOUNTER — Other Ambulatory Visit: Payer: Self-pay | Admitting: Family Medicine

## 2019-04-26 DIAGNOSIS — N760 Acute vaginitis: Secondary | ICD-10-CM

## 2019-04-26 DIAGNOSIS — B9689 Other specified bacterial agents as the cause of diseases classified elsewhere: Secondary | ICD-10-CM

## 2019-04-26 MED ORDER — METRONIDAZOLE 500 MG PO TABS: 500.0000 mg | ORAL_TABLET | Freq: Two times a day (BID) | ORAL | 0 refills | Status: AC

## 2019-04-26 NOTE — Progress Notes (Signed)
Tx for BV

## 2019-04-27 ENCOUNTER — Telehealth: Payer: Self-pay

## 2019-04-27 NOTE — Telephone Encounter (Signed)
-----   Message from Clarnce Flock, MD sent at 04/26/2019 11:36 PM EDT ----- Regarding: Follow up Please call pt and see how her labial abscess is doing. Her wound culture came back positive for Staph Aureus. The antibiotic we sent her will treat this infection, please make sure she is taking it and if it is not improving schedule her for follow up. Thanks [employer]

## 2019-04-27 NOTE — Telephone Encounter (Signed)
Patient notified of results and RX. Verbalized understanding.  

## 2019-07-08 ENCOUNTER — Emergency Department (HOSPITAL_COMMUNITY)
Admission: EM | Admit: 2019-07-08 | Discharge: 2019-07-08 | Disposition: A | Discharge status: Home / Self Care | Payer: Commercial Managed Care - PPO | Attending: Emergency Medicine | Admitting: Emergency Medicine

## 2019-07-08 ENCOUNTER — Other Ambulatory Visit: Payer: Self-pay

## 2019-07-08 ENCOUNTER — Encounter (HOSPITAL_COMMUNITY): Payer: Self-pay | Admitting: Emergency Medicine

## 2019-07-08 DIAGNOSIS — Z5321 Procedure and treatment not carried out due to patient leaving prior to being seen by health care provider: Secondary | ICD-10-CM | POA: Diagnosis not present

## 2019-07-08 DIAGNOSIS — M7918 Myalgia, other site: Secondary | ICD-10-CM | POA: Insufficient documentation

## 2019-07-08 DIAGNOSIS — R0981 Nasal congestion: Secondary | ICD-10-CM | POA: Insufficient documentation

## 2019-07-08 NOTE — ED Triage Notes (Signed)
Per pt, states nasal congestion, body aches and lethargy since yesterday

## 2019-07-08 NOTE — ED Notes (Signed)
Pt not in lobby upon assessment. Called X#

## 2019-07-08 NOTE — ED Notes (Signed)
Pt called from lobby X3 with no response

## 2019-07-08 NOTE — ED Notes (Signed)
Pt called from the lobby with no response 

## 2019-08-10 ENCOUNTER — Other Ambulatory Visit: Payer: Self-pay

## 2019-08-10 ENCOUNTER — Emergency Department (HOSPITAL_COMMUNITY)
Admission: EM | Admit: 2019-08-10 | Discharge: 2019-08-10 | Disposition: A | Discharge status: Home / Self Care | Payer: Commercial Managed Care - PPO | Attending: Emergency Medicine | Admitting: Emergency Medicine

## 2019-08-10 ENCOUNTER — Encounter (HOSPITAL_COMMUNITY): Payer: Self-pay | Admitting: Emergency Medicine

## 2019-08-10 DIAGNOSIS — H5713 Ocular pain, bilateral: Secondary | ICD-10-CM

## 2019-08-10 DIAGNOSIS — F17210 Nicotine dependence, cigarettes, uncomplicated: Secondary | ICD-10-CM | POA: Diagnosis not present

## 2019-08-10 DIAGNOSIS — Z79899 Other long term (current) drug therapy: Secondary | ICD-10-CM | POA: Diagnosis not present

## 2019-08-10 DIAGNOSIS — J3489 Other specified disorders of nose and nasal sinuses: Secondary | ICD-10-CM

## 2019-08-10 DIAGNOSIS — E119 Type 2 diabetes mellitus without complications: Secondary | ICD-10-CM | POA: Diagnosis not present

## 2019-08-10 DIAGNOSIS — L02415 Cutaneous abscess of right lower limb: Secondary | ICD-10-CM | POA: Diagnosis not present

## 2019-08-10 DIAGNOSIS — Z794 Long term (current) use of insulin: Secondary | ICD-10-CM | POA: Diagnosis not present

## 2019-08-10 LAB — POC URINE PREG, ED: Preg Test, Ur: NEGATIVE

## 2019-08-10 MED ORDER — TETRACAINE HCL 0.5 % OP SOLN
2.0000 [drp] | Freq: Once | OPHTHALMIC | Status: AC
  Administered 2019-08-10: 2 [drp] via OPHTHALMIC
  Filled 2019-08-10: qty 4

## 2019-08-10 MED ORDER — ACETAMINOPHEN 325 MG PO TABS
650.0000 mg | ORAL_TABLET | Freq: Once | ORAL | Status: AC
  Administered 2019-08-10: 650 mg via ORAL
  Filled 2019-08-10: qty 2

## 2019-08-10 MED ORDER — SULFAMETHOXAZOLE-TRIMETHOPRIM 800-160 MG PO TABS: 1.0000 | ORAL_TABLET | Freq: Two times a day (BID) | ORAL | 0 refills | Status: AC

## 2019-08-10 MED ORDER — CEPHALEXIN 500 MG PO CAPS: 500.0000 mg | ORAL_CAPSULE | Freq: Four times a day (QID) | ORAL | 0 refills | Status: AC

## 2019-08-10 MED ORDER — FLUORESCEIN SODIUM 1 MG OP STRP
1.0000 | ORAL_STRIP | Freq: Once | OPHTHALMIC | Status: AC
  Administered 2019-08-10: 1 via OPHTHALMIC
  Filled 2019-08-10: qty 1

## 2019-08-10 NOTE — ED Provider Notes (Addendum)
[facility] DEPT Provider Note   CSN: [id number] Arrival date & time: 08/10/19  1911     History Chief Complaint  Patient presents with  . Eye Pain  . Abscess    right lower leg    [name] [name] is a 29 y.o. female history of obesity, diabetes, PCOS, Cushing syndrome presents today with 2 complaints.  Patient's initial complaint is pressure of her forehead and behind both of her eyes ongoing x3 days.  Patient reports history of similar in the past whenever she "needs a new prescription".  She reports that she has an eye doctor's appointment on Monday but felt that pain was too great to wait for that appointment.  She describes a mild pressure constant nonradiating no clear aggravating or relieving factors.  She has not attempted any medication prior to arrival for her symptoms.  Patient second concern today is an abscess of her right anterior lower leg, she reports that she developed an ingrown hair to the area 1 week ago, the area grew in size and then "busted".  Draining a small amount of purulent fluid that has since crusted over.  She reports history of similar abscesses in the past.  She reports a mild throbbing pain to the area only when touched but no pain when it is left alone.  Pain does not radiate.  She denies any history of fever/chills, eye drainage, headache, vision changes, dizziness, neck stiffness, nausea/vomiting, chest pain/shortness of breath, abdominal pain, numbness/weakness, tingling or any additional concerns.  HPI     Past Medical History:  Diagnosis Date  . 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 Diabetes mellitus    Type II  . Gonorrhea   . Hirsutism   . PCOS (polycystic ovarian syndrome)     Patient Active Problem List   Diagnosis Date Noted  . Screening for STD (sexually transmitted disease) 04/20/2019  . Yeast vaginitis 04/20/2019  . Labial abscess 04/20/2019  . Vaginal dryness 04/20/2019  . Breast abscess 01/25/2019  .  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 Uncontrolled diabetes mellitus ([place]) 06/04/2013  . 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 PCOS (polycystic ovarian syndrome) 01/08/2013    Past Surgical History:  Procedure Laterality Date  . ABSCESS DRAINAGE    . THERAPEUTIC ABORTION    . TOOTH EXTRACTION       OB History    Gravida  3   Para  1   Term  1   Preterm  0   AB  2   Living  1     SAB  1   TAB  1   Ectopic  0   Multiple  0   Live Births  1           Family History  Problem Relation Age of Onset  . Epilepsy Father   . Cancer Father        lung cancer  . COPD Maternal Grandmother   . Hypertension Maternal Grandmother   . Heart disease Maternal Grandmother   . Cancer Paternal Grandfather     Social History   Tobacco Use  . Smoking status: Current Every Day Smoker    Packs/day: 0.25    Types: Cigarettes  . Smokeless tobacco: Never Used  Substance Use Topics  . Alcohol use: Not Currently  . Drug use: No    Home Medications Prior to Admission medications   Medication Sig Start Date End Date Taking? Authorizing Provider  insulin glargine (LANTUS) 100 UNIT/ML injection Inject 0.15 mLs (15 Units total) into the skin at bedtime. 01/16/18  Yes Varney Biles, MD  metFORMIN (GLUCOPHAGE) 500 MG tablet Take 1 tablet (500 mg total) by mouth 2 (two) times daily with a meal. Patient taking differently: Take 1,000 mg by mouth 2 (two) times daily with a meal.  01/16/18  Yes Nanavati, Ankit, MD  Blood Glucose Monitoring Suppl (TRUE METRIX METER) w/Device KIT Use as directed 07/28/16   Tresa Garter, MD  cephALEXin (KEFLEX) 500 MG capsule Take 1 capsule (500 mg total) by mouth 4 (four) times daily for 7 days. 08/10/19 08/17/19  Nuala Alpha A, PA-C  glipiZIDE (GLUCOTROL) 5 MG tablet Take 1 tablet (5 mg total) by mouth daily before breakfast. Patient not taking: Reported on 04/20/2019 03/11/17   Alfonse Spruce, FNP    glucose blood (TRUE METRIX BLOOD GLUCOSE TEST) test strip Use as instructed 03/11/17   Alfonse Spruce, FNP  medroxyPROGESTERone (PROVERA) 10 MG tablet Take 1 tablet (10 mg total) by mouth daily. Patient not taking: Reported on 04/20/2019 10/05/18   Truett Mainland, DO  oseltamivir (TAMIFLU) 75 MG capsule Take 1 capsule (75 mg total) by mouth every 12 (twelve) hours. Patient not taking: Reported on 04/20/2019 07/25/18   Henderly, Britni A, PA-C  polyethylene glycol (MIRALAX / GLYCOLAX) 17 g packet Take 17 g by mouth daily. Patient not taking: Reported on 08/10/2019 04/25/19   Domenic Moras, PA-C  sulfamethoxazole-trimethoprim (BACTRIM DS) 800-160 MG tablet Take 1 tablet by mouth 2 (two) times daily for 7 days. 08/10/19 08/17/19  Nuala Alpha A, PA-C    Allergies    Lisinopril  Review of Systems   Review of Systems Ten systems are reviewed and are negative for acute change except as noted in the HPI  Physical Exam Updated Vital Signs BP 131/85 (BP Location: Left Arm)   Pulse 100   Temp 98.5 F (36.9 C) (Oral)   Resp 16   Ht 5' 7"  (1.702 m)   Wt 120.2 kg   SpO2 100%   BMI 41.50 kg/m   Physical Exam Constitutional:      General: She is not in acute distress.    Appearance: Normal appearance. She is well-developed. She is not ill-appearing or diaphoretic.  HENT:     Head: Normocephalic and atraumatic.     Jaw: There is normal jaw occlusion. No trismus.     Right Ear: External ear normal.     Left Ear: External ear normal.     Nose: Rhinorrhea present. Rhinorrhea is clear.     Right Nostril: No epistaxis.     Left Nostril: No epistaxis.     Mouth/Throat:     Mouth: Mucous membranes are moist.     Pharynx: Oropharynx is clear.  Eyes:     General: Vision grossly intact. Gaze aligned appropriately.     Extraocular Movements: Extraocular movements intact.     Conjunctiva/sclera: Conjunctivae normal.     Pupils: Pupils are equal, round, and reactive to light.     Comments: Both  Eye: Appearance normal. No erythema or scleral icterus. No discharge. Conjunctiva clear. PEERL intact. EOMI without nystagmus. No photophobia or consensual photophobia.   Corneal Abrasion Exam Verbal Consent Obtained. Risks, benefits and alternatives explained. 2 drops of tetracaine (PONTOCAINE) 0.5 % ophthalmic solution were applied to eyes. Fluorescein 1 MG ophthalmic strip applied the the surface of the eyes Wood's lamp used to screen for abrasion. No increased fluorescein uptake.  No corneal ulcer. Negative Seidel sign. No foreign bodies noted. No visible hyphema. Eye flushed with sterile saline. Patient tolerated the procedure well  TONOPEN:16 LEFT, 18 RIGHT  Neck:     Trachea: Trachea and phonation normal. No tracheal deviation.  Pulmonary:     Effort: Pulmonary effort is normal. No respiratory distress.  Abdominal:     General: There is no distension.     Palpations: Abdomen is soft.     Tenderness: There is no abdominal tenderness. There is no guarding or rebound.  Musculoskeletal:        General: Normal range of motion.     Cervical back: Normal range of motion.  Skin:    General: Skin is warm and dry.  Neurological:     Mental Status: She is alert.     GCS: GCS eye subscore is 4. GCS verbal subscore is 5. GCS motor subscore is 6.     Comments: Speech is clear and goal oriented, follows commands Major Cranial nerves without deficit, no facial droop Moves extremities without ataxia, coordination intact  Psychiatric:        Behavior: Behavior normal.     ED Results / Procedures / Treatments   Labs (all labs ordered are listed, but only abnormal results are displayed) Labs Reviewed  POC URINE PREG, ED    EKG None  Radiology No results found.  Procedures Procedures (including critical care time)  Medications Ordered in ED Medications  fluorescein ophthalmic strip 1 strip (1 strip Right Eye Given 08/10/19 2158)  tetracaine (PONTOCAINE) 0.5 % ophthalmic solution  2 drop (2 drops Right Eye Given 08/10/19 2158)  acetaminophen (TYLENOL) tablet 650 mg (650 mg Oral Given 08/10/19 2158)    ED Course  I have reviewed the triage vital signs and the nursing notes.  Pertinent labs & imaging results that were available during my care of the patient were reviewed by me and considered in my medical decision making (see chart for details).    MDM Rules/Calculators/A&P                     29 year old female presents today for 2 complaints, first for pressure behind the eyes and of the forehead ongoing for 3 days.  She reports history of similar in the past whenever she needs a new eyeglasses prescription and reports that she has an eye doctor's appointment in 2 days for new glasses.  She denies any visual changes has no neurologic complaints.  Eye exam is within normal limits, pressures are normal.  No evidence of glaucoma trauma, herpetic lesion, iritis, hyphema/hypopyon, cellulitis or other emergent pathologies.  Additionally she has no drainage or evidence of conjunctivitis at this time.  She does have some rhinorrhea, suspect this may be due to sinus pressure versus eyestrain.  No indication for antibiotics at this point.  Will give patient Tylenol and encouraged follow-up with her eye doctors on Monday for reassessment and for prescription of glasses.  She has no red flags at this time concerning for meningitis, venous sinus thrombosis, life-threatening intracranial process or other emergent pathologies requiring further work-up in the ED at this time. Discussed with Dr. Johnney Killian who agrees with plan.  Additionally patient presents with a small draining abscess of her right anterior lower leg, she reports this started as an ingrown hair which had come to ahead and began draining several days ago.  She has a mild amount of surrounding cellulitis, no signs of systemic infection.  Additionally it is  currently draining, no indication for incision/drainage at this time, will  encourage patient to begin using warm compresses at home.  She reports a history of abscesses requiring antibiotics in the past.  Will give patient prescription for Keflex/Bactrim and have her follow-up with her PCP for recheck in 3 days.  Pregnancy test negative patient denies history of CKD or allergic reaction to antibiotics.  At this time there does not appear to be any evidence of an acute emergency medical condition and the patient appears stable for discharge with appropriate outpatient follow up. Diagnosis was discussed with patient who verbalizes understanding of care plan and is agreeable to discharge. I have discussed return precautions with patient who verbalizes understanding of return precautions. Patient encouraged to follow-up with their PCP and eye doctor. All questions answered.  Note: Portions of this report may have been transcribed using voice recognition software. Every effort was made to ensure accuracy; however, inadvertent computerized transcription errors may still be present. Final Clinical Impression(s) / ED Diagnoses Final diagnoses:  Pain of both eyes  Abscess of right lower leg  Sinus pressure    Rx / DC Orders ED Discharge Orders         Ordered    sulfamethoxazole-trimethoprim (BACTRIM DS) 800-160 MG tablet  2 times daily     08/10/19 2239    cephALEXin (KEFLEX) 500 MG capsule  4 times daily     08/10/19 2239           Gari Crown 08/10/19 2241    Deliah Boston, PA-C 08/10/19 2244    Charlesetta Shanks, MD 08/13/19 1424

## 2019-08-10 NOTE — Discharge Instructions (Addendum)
You have been diagnosed today with Abscess of the right lower leg and bilateral eye pain and sinus pressure.  At this time there does not appear to be the presence of an emergent medical condition, however there is always the potential for conditions to change. Please read and follow the below instructions.  Please return to the Emergency Department immediately for any new or worsening symptoms or if your symptoms do not begin to improve within 2 days. Please be sure to follow up with your Primary Care Provider within one week regarding your visit today; please call their office to schedule an appointment even if you are feeling better for a follow-up visit. You may take the antibiotic medications Keflex/Bactrim for treatment of your leg infection.  Please continue to use warm compresses on the area to facilitate continued drainage.  Please have your primary care provider recheck the area in 2-3 days to ensure improvement. Additionally please follow-up with your eye doctor as scheduled on Monday for repeat eye exam and for new prescription glasses.  Follow-up with your primary care provider even if you are feeling better for reassessment. You may take over-the-counter anti-inflammatories such as Tylenol as directed on the packaging to help with your symptoms.  Get help right away if: You have trouble seeing. You suddenly have very bad pain in your face or head. You start to have quick, sudden movements or shaking that you cannot control (seizure). You are confused. You have a stiff neck. You have very bad (severe) pain. You see red streaks on your skin spreading away from the abscess. You have any new/concerning or worsening of symptoms  Please read the additional information packets attached to your discharge summary.  Do not take your medicine if  develop an itchy rash, swelling in your mouth or lips, or difficulty breathing; call 911 and seek immediate emergency medical attention if this  occurs.  Note: Portions of this text may have been transcribed using voice recognition software. Every effort was made to ensure accuracy; however, inadvertent computerized transcription errors may still be present.

## 2019-08-10 NOTE — ED Triage Notes (Signed)
Patient complaining of pressure behind eyes. Patient also complaining of right leg abscess that has a scab over it. The abscess look like it has been busted.

## 2019-08-13 ENCOUNTER — Emergency Department (HOSPITAL_COMMUNITY)
Admission: EM | Admit: 2019-08-13 | Discharge: 2019-08-13 | Disposition: A | Discharge status: Home / Self Care | Payer: Commercial Managed Care - PPO | Attending: Emergency Medicine | Admitting: Emergency Medicine

## 2019-08-13 ENCOUNTER — Other Ambulatory Visit: Payer: Self-pay

## 2019-08-13 DIAGNOSIS — E119 Type 2 diabetes mellitus without complications: Secondary | ICD-10-CM | POA: Diagnosis not present

## 2019-08-13 DIAGNOSIS — L02415 Cutaneous abscess of right lower limb: Secondary | ICD-10-CM | POA: Insufficient documentation

## 2019-08-13 DIAGNOSIS — Z794 Long term (current) use of insulin: Secondary | ICD-10-CM | POA: Insufficient documentation

## 2019-08-13 DIAGNOSIS — F17210 Nicotine dependence, cigarettes, uncomplicated: Secondary | ICD-10-CM | POA: Insufficient documentation

## 2019-08-13 NOTE — ED Notes (Signed)
Pt given discharge instructions. Ready to leave; denied teaching, declined departure signature.

## 2019-08-13 NOTE — ED Notes (Signed)
Small scabbed sore present on RLE. No draining present. Pt states she was seen in [facility] ED on Friday, picked up 7-day antibiotic RX yesterday. First dose taken around 2PM yesterday, second dose taken this AM. Pain was worse upon waking. Pt states it is painful to walk. Tylenol taken at 11AM with no relief.

## 2019-08-13 NOTE — Discharge Instructions (Addendum)
Please take your antibiotics as prescribed at your [facility] long visit.  You may avoid any tight clothing, you will need to air the wound. Avoid covering if possible.

## 2019-08-13 NOTE — ED Provider Notes (Signed)
[facility] EMERGENCY DEPARTMENT Provider Note   CSN: [id number] Arrival date & time: 08/13/19  1231     History No chief complaint on file.   [name] [name] is a 29 y.o. female.  69 y. Female with a PMH of Cushing's syndrome, DM, presents to the ED with a chief complaint of right leg abscess x last week. Patient was seen at [facility] and placed on bactrim along with keflex but did not fill the prescription until yesterday when she took her first dose of antibiotics.  She reports there has been no improvement in her symptoms, does have pain to the area which is worse with ambulation along with pants pushing against it.  Patient ports she does have to wear pants for work, this has made it somewhat irritated.  She denies any fever, drainage from the wound, or complaints.  The history is provided by the patient and medical records.       Past Medical History:  Diagnosis Date  . Cushing syndrome ([place])   . Diabetes mellitus    Type II  . Gonorrhea   . Hirsutism   . PCOS (polycystic ovarian syndrome)     Patient Active Problem List   Diagnosis Date Noted  . Screening for STD (sexually transmitted disease) 04/20/2019  . Yeast vaginitis 04/20/2019  . Labial abscess 04/20/2019  . Vaginal dryness 04/20/2019  . Breast abscess 01/25/2019  . 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 Uncontrolled diabetes mellitus ([place]) 06/04/2013  . Cushings syndrome ([place]) 01/08/2013  . PCOS (polycystic ovarian syndrome) 01/08/2013    Past Surgical History:  Procedure Laterality Date  . ABSCESS DRAINAGE    . THERAPEUTIC ABORTION    . TOOTH EXTRACTION       OB History    Gravida  3   Para  1   Term  1   Preterm  0   AB  2   Living  1     SAB  1   TAB  1   Ectopic  0   Multiple  0   Live Births  1           Family History  Problem Relation Age of Onset  . Epilepsy Father   . Cancer Father        lung cancer  . COPD  Maternal Grandmother   . Hypertension Maternal Grandmother   . Heart disease Maternal Grandmother   . Cancer Paternal Grandfather     Social History   Tobacco Use  . Smoking status: Current Every Day Smoker    Packs/day: 0.25    Types: Cigarettes  . Smokeless tobacco: Never Used  Substance Use Topics  . Alcohol use: Not Currently  . Drug use: No    Home Medications Prior to Admission medications   Medication Sig Start Date End Date Taking? Authorizing Provider  Blood Glucose Monitoring Suppl (TRUE METRIX METER) w/Device KIT Use as directed 07/28/16   Tresa Garter, MD  cephALEXin (KEFLEX) 500 MG capsule Take 1 capsule (500 mg total) by mouth 4 (four) times daily for 7 days. 08/10/19 08/17/19  Nuala Alpha A, PA-C  glipiZIDE (GLUCOTROL) 5 MG tablet Take 1 tablet (5 mg total) by mouth daily before breakfast. Patient not taking: Reported on 04/20/2019 03/11/17   Alfonse Spruce, FNP  glucose blood (TRUE METRIX BLOOD GLUCOSE TEST) test strip Use as instructed 03/11/17   Alfonse Spruce, FNP  insulin glargine (LANTUS) 100  UNIT/ML injection Inject 0.15 mLs (15 Units total) into the skin at bedtime. 01/16/18   Varney Biles, MD  medroxyPROGESTERone (PROVERA) 10 MG tablet Take 1 tablet (10 mg total) by mouth daily. Patient not taking: Reported on 04/20/2019 10/05/18   Truett Mainland, DO  metFORMIN (GLUCOPHAGE) 500 MG tablet Take 1 tablet (500 mg total) by mouth 2 (two) times daily with a meal. Patient taking differently: Take 1,000 mg by mouth 2 (two) times daily with a meal.  01/16/18   Varney Biles, MD  oseltamivir (TAMIFLU) 75 MG capsule Take 1 capsule (75 mg total) by mouth every 12 (twelve) hours. Patient not taking: Reported on 04/20/2019 07/25/18   Henderly, Britni A, PA-C  polyethylene glycol (MIRALAX / GLYCOLAX) 17 g packet Take 17 g by mouth daily. Patient not taking: Reported on 08/10/2019 04/25/19   Domenic Moras, PA-C  sulfamethoxazole-trimethoprim (BACTRIM DS)  800-160 MG tablet Take 1 tablet by mouth 2 (two) times daily for 7 days. 08/10/19 08/17/19  Nuala Alpha A, PA-C    Allergies    Lisinopril  Review of Systems   Review of Systems  Constitutional: Negative for fever.  Respiratory: Negative for shortness of breath.   Skin: Positive for wound.    Physical Exam Updated Vital Signs BP 133/76 (BP Location: Right Arm)   Pulse 92   Temp 98.5 F (36.9 C) (Oral)   Resp 16   SpO2 100%   Physical Exam Vitals and nursing note reviewed.  Constitutional:      Appearance: Normal appearance. She is obese.  HENT:     Head: Normocephalic and atraumatic.     Nose: Nose normal.     Mouth/Throat:     Mouth: Mucous membranes are moist.  Eyes:     Pupils: Pupils are equal, round, and reactive to light.  Cardiovascular:     Rate and Rhythm: Normal rate.  Pulmonary:     Effort: Pulmonary effort is normal.  Abdominal:     General: Abdomen is flat. There is no distension.     Tenderness: There is no abdominal tenderness. There is no guarding.  Musculoskeletal:     Cervical back: Normal range of motion and neck supple.       Legs:  Skin:    General: Skin is warm and dry.  Neurological:     Mental Status: She is alert and oriented to person, place, and time.     ED Results / Procedures / Treatments   Labs (all labs ordered are listed, but only abnormal results are displayed) Labs Reviewed - No data to display  EKG None  Radiology No results found.  Procedures Procedures (including critical care time)  Medications Ordered in ED Medications - No data to display  ED Course  I have reviewed the triage vital signs and the nursing notes.  Pertinent labs & imaging results that were available during my care of the patient were reviewed by me and considered in my medical decision making (see chart for details).    MDM Rules/Calculators/A&P   Patient with past medical history of diabetes, PCOS presents to the ED with complaints  of right leg abscess, seen at [facility] long ED on Friday prescribed Keflex along with Bactrim but did not fill this prescription until yesterday when she took her first dose of antibiotics.  Reports there has been no improvement in her wound, although she is only taken the antibiotics for 1 day.  She reports there is pain with ambulation along with pain  with her clothing rubbing against the right leg.  He has not been running any fevers, no signs of further infection.  Wound is very well-appearing without any signs of purulent discharge, erythema. Patient was educated on antibiotic use, she is recommended to follow-up with her PCP in the next 4 days.  She understands and agrees with management, return precautions discussed at length.    Portions of this note were generated with [employer]. Dictation errors may occur despite best attempts at proofreading.  Final Clinical Impression(s) / ED Diagnoses Final diagnoses:  Abscess of right leg    Rx / DC Orders ED Discharge Orders    None       Janeece Fitting, PA-C 08/13/19 1337    Valarie Merino, MD 08/17/19 1352

## 2019-08-13 NOTE — ED Triage Notes (Signed)
Pt here for evaluation of swelling and pain to R leg, saw a white pimple on her R shin one week ago, she popped it, and now it is swollen and painful. Seen for same at [facility], on day three of abx and sts it is getting worse.

## 2019-08-14 ENCOUNTER — Encounter: Payer: Self-pay | Admitting: Infectious Diseases

## 2019-08-14 ENCOUNTER — Telehealth (INDEPENDENT_AMBULATORY_CARE_PROVIDER_SITE_OTHER): Payer: Commercial Managed Care - PPO | Admitting: Infectious Diseases

## 2019-08-14 VITALS — Ht 67.0 in

## 2019-08-14 DIAGNOSIS — E1165 Type 2 diabetes mellitus with hyperglycemia: Secondary | ICD-10-CM | POA: Diagnosis not present

## 2019-08-14 DIAGNOSIS — B3731 Acute candidiasis of vulva and vagina: Secondary | ICD-10-CM

## 2019-08-14 DIAGNOSIS — L03115 Cellulitis of right lower limb: Secondary | ICD-10-CM

## 2019-08-14 DIAGNOSIS — B373 Candidiasis of vulva and vagina: Secondary | ICD-10-CM

## 2019-08-14 HISTORY — DX: Cellulitis of right lower limb: L03.115

## 2019-08-14 MED ORDER — FLUCONAZOLE 150 MG PO TABS: 150.0000 mg | ORAL_TABLET | Freq: Every day | ORAL | 1 refills | Status: DC

## 2019-08-14 MED ORDER — AMOXICILLIN-POT CLAVULANATE 875-125 MG PO TABS: 1.0000 | ORAL_TABLET | Freq: Two times a day (BID) | ORAL | 0 refills | Status: AC

## 2019-08-14 NOTE — Progress Notes (Signed)
Patient: [name] [name]  DOB: 1990-12-10 MRN: [id number] PCP: Patient, No Pcp Per   Virtual Visit via Telephone Visit:  I connected with [name] [name] on 08/14/19 at 10:00 AM EST by telephone and verified that I am speaking with the correct person using two identifiers.   I discussed the limitations, risks, security and privacy concerns of performing an evaluation and management service by telephone and the availability of in person appointments. I also discussed with the patient that there may be a patient responsible charge related to this service. The patient expressed understanding and agreed to proceed.  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 Cellulitis of right leg 08/14/2019  . Screening for STD (sexually transmitted disease) 04/20/2019  . Yeast vaginitis 04/20/2019  . Vaginal dryness 04/20/2019  . 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 Uncontrolled diabetes mellitus ([place]) 06/04/2013  . Cushings syndrome ([place]) 01/08/2013  . PCOS (polycystic ovarian syndrome) 01/08/2013     Subjective:  [name] [name] is a 29 y.o. 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 She has since had recurrent skin abscess in various locations. Hx diabetes with last A1C in May 2020 - 10.1%. Recently referred to endocrinology by PCP for help with management.   She developed an abscess over the right lower anterior shin. Went to [facility]  2/05; at the time of her evaluation she gave the history of the abscess developing 1 week ago that started as an ingrown hair. The site grew in size then spontaneously opened and drained purulent fluid and crusted over. She has some mild throbbing pain at that time. 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 She has had a hard time with the cephalexin  QID and has tried to rely phone alarm to remind her to take. She has had some worsening pain to the leg with associated swelling, erythema and warmth. No fevers/chills. Swelling is located to anterior shin below the knee towards the right lateral shin. She is currently working and either sits frequently or on her feet.   Feels that she is getting a yeast infection.    Review of Systems  Constitutional: Negative for chills and fever.  Cardiovascular: Positive for leg swelling.  Gastrointestinal: Negative for diarrhea and vomiting.  Genitourinary: Negative for dysuria and flank pain.  Musculoskeletal: Negative for arthralgias, myalgias and neck pain.  Skin: Positive for color change, rash and wound.     Past Medical History:  Diagnosis Date  . Cushing syndrome ([place])   . Diabetes mellitus    Type II  . Gonorrhea   . Hirsutism   . PCOS (polycystic ovarian syndrome)     Outpatient Medications Prior to Visit  Medication Sig Dispense Refill  . cephALEXin (KEFLEX) 500 MG capsule Take 1 capsule (500 mg total) by mouth 4 (four) times daily for 7 days. 28 capsule 0  . insulin glargine (LANTUS) 100 UNIT/ML injection Inject 0.15 mLs (15 Units total) into the skin at bedtime. 10 mL 1  . metFORMIN (GLUCOPHAGE) 500 MG tablet Take 1 tablet (500 mg total) by mouth 2 (two) times daily with a meal. 60 tablet 1  . sulfamethoxazole-trimethoprim (BACTRIM DS) 800-160 MG tablet Take 1 tablet by mouth 2 (two) times daily for 7 days. 14 tablet 0  .  Blood Glucose Monitoring Suppl (TRUE METRIX METER) w/Device KIT Use as directed (Patient not taking: Reported on 08/14/2019) 1 kit 0  . glipiZIDE (GLUCOTROL) 5 MG tablet Take 1 tablet (5 mg total) by mouth daily before breakfast. (Patient not taking: Reported on 04/20/2019) 30 tablet 2  . glucose blood (TRUE METRIX BLOOD GLUCOSE TEST) test strip Use as instructed (Patient not taking: Reported on 08/14/2019) 100 each 12  . medroxyPROGESTERone (PROVERA) 10 MG tablet Take  1 tablet (10 mg total) by mouth daily. (Patient not taking: Reported on 04/20/2019) 30 tablet 0  . oseltamivir (TAMIFLU) 75 MG capsule Take 1 capsule (75 mg total) by mouth every 12 (twelve) hours. (Patient not taking: Reported on 04/20/2019) 10 capsule 0  . polyethylene glycol (MIRALAX / GLYCOLAX) 17 g packet Take 17 g by mouth daily. (Patient not taking: Reported on 08/10/2019) 14 each 0   No facility-administered medications prior to visit.     Allergies  Allergen Reactions  . Lisinopril Other (See Comments), Shortness Of Breath and Anaphylaxis    Chest pains Chest pains and tightness tachycardia    Social History   Tobacco Use  . Smoking status: Current Every Day Smoker    Packs/day: 0.25    Types: Cigarettes  . Smokeless tobacco: Never Used  Substance Use Topics  . Alcohol use: Not Currently  . Drug use: No    Family History  Problem Relation Age of Onset  . Epilepsy Father   . Cancer Father        lung cancer  . COPD Maternal Grandmother   . Hypertension Maternal Grandmother   . Heart disease Maternal Grandmother   . Cancer Paternal Grandfather     Objective:   Vitals:   08/14/19 1015  Height: 5' 7"  (1.702 m)   Body mass index is 41.5 kg/m.  Physical Exam Pulmonary:     Effort: Pulmonary effort is normal.     Comments: No shortness of breath detected in conversation.  Skin:    Comments: Skin overlying right anterior shin with crusted ulcer < 0.5cm in appearance with hyperpigmentation surounding wound, swelling present with the presence of surrounding shiny appearing skin and blotchy areas of erythema extending down the anterior shin and superior to the wound below the knee.   Neurological:     Mental Status: She is oriented to person, place, and time.  Psychiatric:        Mood and Affect: Mood normal.        Behavior: Behavior normal.        Thought Content: Thought content normal.        Judgment: Judgment normal.    Patient photo of affected leg at  the time of visit.      Lab Results: Lab Results  Component Value Date   WBC 7.7 04/01/2019   HGB 13.7 04/01/2019   HCT 41.0 04/01/2019   MCV 94.3 04/01/2019   PLT 233 04/01/2019    Lab Results  Component Value Date   CREATININE 0.62 04/01/2019   BUN 12 04/01/2019   NA 136 04/01/2019   K 4.2 04/01/2019   CL 103 04/01/2019   CO2 23 04/01/2019    Lab Results  Component Value Date   ALT 12 02/26/2019   AST 9 (L) 02/26/2019   ALKPHOS 83 02/26/2019   BILITOT 0.6 02/26/2019     Assessment & Plan:   Problem List Items Addressed This Visit      Unprioritized   Yeast vaginitis - Primary  Sx c/w candida vaginitis - will give Rx for diflucan 150 mg x 1 dose with instructions to repeat in 5-7d if needed. If not improvement she is at risk alternatively for bacterial vaginosis. F/U with PCP if no improvement.       Relevant Medications   fluconazole (DIFLUCAN) 150 MG tablet   Uncontrolled diabetes mellitus ([place])    Working with PCP and recent referral to endocrinology. Will be very important to reduce future risk for recurrent infections to get this under ideal control with A1C < 8% preferably.       Cellulitis of right leg    She had an abscess that spontaneously ruptured over a week ago that was described to be purulent due to ingrown hair that became infected. She has residual crusted area overlying the shin from central opening of abscess and what appears to be (and is described as) cellulitis to the shin.  Cephalexin is the correct antibiotic however she is having trouble with QID dosing; will switch to Augmentin for BID dosing to target strep species and MSSA. 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  Explained that we can see an initial increase in pain/redness with appropriate treatment and to continue with antibiotics.   She will send me an update including a photo via [facility] Friday of this  week to determine if she needs longer course of ABX.   Recommended rest, elevation of the extremity, cold application for pain and compression therapy. Discussed that compression stockings will be helpful to avoid recurrent infections in the future.   She can follow up with her PCP.          Janene Madeira, MSN, NP-C [facility] for Infectious [place] Pager: [phone] Office: [phone]  08/14/19  9:32 PM

## 2019-08-14 NOTE — Patient Instructions (Addendum)
For your infection:  1. Continue your Bactrim twice a day.  2. I am going to send in Augmentin for you to take in place of the cephalexin - this works just as well against common bacteria that causes cellulitis but only needs to be taken twice a day. Much easier.   Please send me a photo today so I can see how it looks.  Also send me an update Friday with a picture also   Things to do to help with the pain: 1. Cool packs 2. Elevate the legs above the level of the heart if you can 3. Compression stockings!!!! These have been proven to be very helpful to prevent cellulitis and heal/recover from it. 10-20 mmHg of pressure is what I would have you start with.     Cellulitis, Adult  Cellulitis is a skin infection. The infected area is often warm, red, swollen, and sore. It occurs most often in the arms and lower legs. It is very important to get treated for this condition. What are the causes? This condition is caused by bacteria. The bacteria enter through a break in the skin, such as a cut, burn, insect bite, open sore, or crack. What increases the risk? This condition is more likely to occur in people who:  Have a weak body defense system (immune system).  Have open cuts, burns, bites, or scrapes on the skin.  Are older than 30 years of age.  Have a blood sugar problem (diabetes).  Have a long-lasting (chronic) liver disease (cirrhosis) or kidney disease.  Are very overweight (obese).  Have a skin problem, such as: ? Itchy rash (eczema). ? Slow movement of blood in the veins (venous stasis). ? Fluid buildup below the skin (edema).  Have been treated with high-energy rays (radiation).  Use IV drugs. What are the signs or symptoms? Symptoms of this condition include:  Skin that is: ? Red. ? Streaking. ? Spotting. ? Swollen. ? Sore or painful when you touch it. ? Warm.  A fever.  Chills.  Blisters. How is this diagnosed? This condition is diagnosed based  on:  Medical history.  Physical exam.  Blood tests.  Imaging tests. How is this treated? Treatment for this condition may include:  Medicines to treat infections or allergies.  Home care, such as: ? Rest. ? Placing cold or warm cloths (compresses) on the skin.  Hospital care, if the condition is very bad. Follow these instructions at home: Medicines  Take over-the-counter and prescription medicines only as told by your doctor.  If you were prescribed an antibiotic medicine, take it as told by your doctor. Do not stop taking it even if you start to feel better. General instructions   Drink enough fluid to keep your pee (urine) pale yellow.  Do not touch or rub the infected area.  Raise (elevate) the infected area above the level of your heart while you are sitting or lying down.  Place cold or warm cloths on the area as told by your doctor.  Keep all follow-up visits as told by your doctor. This is important. Contact a doctor if:  You have a fever.  You do not start to get better after 1-2 days of treatment.  Your bone or joint under the infected area starts to hurt after the skin has healed.  Your infection comes back. This can happen in the same area or another area.  You have a swollen bump in the area.  You have new symptoms.  You feel ill and have muscle aches and pains. Get help right away if:  Your symptoms get worse.  You feel very sleepy.  You throw up (vomit) or have watery poop (diarrhea) for a long time.  You see red streaks coming from the area.  Your red area gets larger.  Your red area turns dark in color. These symptoms may represent a serious problem that is an emergency. Do not wait to see if the symptoms will go away. Get medical help right away. Call your local emergency services (911 in the [place] Do not drive yourself to the hospital. Summary  Cellulitis is a skin infection. The area is often warm, red, swollen, and  sore.  This condition is treated with medicines, rest, and cold and warm cloths.  Take all medicines only as told by your doctor.  Tell your doctor if symptoms do not start to get better after 1-2 days of treatment. This information is not intended to replace advice given to you by your health care provider. Make sure you discuss any questions you have with your health care provider. Document Revised: 11/10/2017 Document Reviewed: 11/10/2017 Elsevier Patient Education  2020 [employer].

## 2019-08-14 NOTE — Assessment & Plan Note (Signed)
She had an abscess that spontaneously ruptured over a week ago that was described to be purulent due to ingrown hair that became infected. She has residual crusted area overlying the shin from central opening of abscess and what appears to be (and is described as) cellulitis to the shin.  Cephalexin is the correct antibiotic however she is having trouble with QID dosing; will switch to Augmentin for BID dosing to target strep species and MSSA. 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  Explained that we can see an initial increase in pain/redness with appropriate treatment and to continue with antibiotics.   She will send me an update including a photo via MyChart Friday of this week to determine if she needs longer course of ABX.   Recommended rest, elevation of the extremity, cold application for pain and compression therapy. Discussed that compression stockings will be helpful to avoid recurrent infections in the future.   She can follow up with her PCP.

## 2019-08-14 NOTE — Assessment & Plan Note (Signed)
Sx c/w candida vaginitis - will give Rx for diflucan 150 mg x 1 dose with instructions to repeat in 5-7d if needed. If not improvement she is at risk alternatively for bacterial vaginosis. F/U with PCP if no improvement.

## 2019-08-14 NOTE — Assessment & Plan Note (Signed)
Working with PCP and recent referral to endocrinology. Will be very important to reduce future risk for recurrent infections to get this under ideal control with A1C < 8% preferably.

## 2019-09-18 ENCOUNTER — Encounter: Payer: Self-pay | Admitting: Obstetrics and Gynecology

## 2019-09-18 ENCOUNTER — Ambulatory Visit (INDEPENDENT_AMBULATORY_CARE_PROVIDER_SITE_OTHER): Payer: Commercial Managed Care - PPO | Admitting: Obstetrics and Gynecology

## 2019-09-18 ENCOUNTER — Other Ambulatory Visit: Payer: Self-pay

## 2019-09-18 VITALS — BP 123/83 | HR 83 | Wt 277.0 lb

## 2019-09-18 DIAGNOSIS — N760 Acute vaginitis: Secondary | ICD-10-CM

## 2019-09-18 DIAGNOSIS — B9689 Other specified bacterial agents as the cause of diseases classified elsewhere: Secondary | ICD-10-CM

## 2019-09-18 DIAGNOSIS — Z975 Presence of (intrauterine) contraceptive device: Secondary | ICD-10-CM | POA: Diagnosis not present

## 2019-09-18 DIAGNOSIS — N898 Other specified noninflammatory disorders of vagina: Secondary | ICD-10-CM | POA: Diagnosis not present

## 2019-09-18 DIAGNOSIS — Z113 Encounter for screening for infections with a predominantly sexual mode of transmission: Secondary | ICD-10-CM | POA: Diagnosis not present

## 2019-09-18 DIAGNOSIS — Z01419 Encounter for gynecological examination (general) (routine) without abnormal findings: Secondary | ICD-10-CM

## 2019-09-18 NOTE — Progress Notes (Signed)
[name] [name] presents with c/o vaginal discharge for several weeks now and desire for STD testing. Pt tried OTC treatment without relief. Sexual active, same partner Nexplanon for contraception Pap 1/20, normal H/O DM, reports in better control  PE AF VSS Lungs clear Heart RRR Abd soft + BS GU nl EGBUS, tannish discharge, no cervical lesions, uterus small mobile non tender no adnexal masses or tenderness  A/P Vaginal discharge        STD exposure  Vaginal swab collected. Will treat according to results.

## 2019-09-18 NOTE — Patient Instructions (Signed)
Health Maintenance, Female Adopting a healthy lifestyle and getting preventive care are important in promoting health and wellness. Ask your health care provider about:  The right schedule for you to have regular tests and exams.  Things you can do on your own to prevent diseases and keep yourself healthy. What should I know about diet, weight, and exercise? Eat a healthy diet   Eat a diet that includes plenty of vegetables, fruits, low-fat dairy products, and lean protein.  Do not eat a lot of foods that are high in solid fats, added sugars, or sodium. Maintain a healthy weight Body mass index (BMI) is used to identify weight problems. It estimates body fat based on height and weight. Your health care provider can help determine your BMI and help you achieve or maintain a healthy weight. Get regular exercise Get regular exercise. This is one of the most important things you can do for your health. Most adults should:  Exercise for at least 150 minutes each week. The exercise should increase your heart rate and make you sweat (moderate-intensity exercise).  Do strengthening exercises at least twice a week. This is in addition to the moderate-intensity exercise.  Spend less time sitting. Even light physical activity can be beneficial. Watch cholesterol and blood lipids Have your blood tested for lipids and cholesterol at 30 years of age, then have this test every 5 years. Have your cholesterol levels checked more often if:  Your lipid or cholesterol levels are high.  You are older than 30 years of age.  You are at high risk for heart disease. What should I know about cancer screening? Depending on your health history and family history, you may need to have cancer screening at various ages. This may include screening for:  Breast cancer.  Cervical cancer.  Colorectal cancer.  Skin cancer.  Lung cancer. What should I know about heart disease, diabetes, and high blood  pressure? Blood pressure and heart disease  High blood pressure causes heart disease and increases the risk of stroke. This is more likely to develop in people who have high blood pressure readings, are of African descent, or are overweight.  Have your blood pressure checked: ? Every 3-5 years if you are 18-39 years of age. ? Every year if you are 40 years old or older. Diabetes Have regular diabetes screenings. This checks your fasting blood sugar level. Have the screening done:  Once every three years after age 40 if you are at a normal weight and have a low risk for diabetes.  More often and at a younger age if you are overweight or have a high risk for diabetes. What should I know about preventing infection? Hepatitis B If you have a higher risk for hepatitis B, you should be screened for this virus. Talk with your health care provider to find out if you are at risk for hepatitis B infection. Hepatitis C Testing is recommended for:  Everyone born from 1945 through 1965.  Anyone with known risk factors for hepatitis C. Sexually transmitted infections (STIs)  Get screened for STIs, including gonorrhea and chlamydia, if: ? You are sexually active and are younger than 30 years of age. ? You are older than 30 years of age and your health care provider tells you that you are at risk for this type of infection. ? Your sexual activity has changed since you were last screened, and you are at increased risk for chlamydia or gonorrhea. Ask your health care provider if   you are at risk.  Ask your health care provider about whether you are at high risk for HIV. Your health care provider may recommend a prescription medicine to help prevent HIV infection. If you choose to take medicine to prevent HIV, you should first get tested for HIV. You should then be tested every 3 months for as long as you are taking the medicine. Pregnancy  If you are about to stop having your period (premenopausal) and  you may become pregnant, seek counseling before you get pregnant.  Take 400 to 800 micrograms (mcg) of folic acid every day if you become pregnant.  Ask for birth control (contraception) if you want to prevent pregnancy. Osteoporosis and menopause Osteoporosis is a disease in which the bones lose minerals and strength with aging. This can result in bone fractures. If you are 65 years old or older, or if you are at risk for osteoporosis and fractures, ask your health care provider if you should:  Be screened for bone loss.  Take a calcium or vitamin D supplement to lower your risk of fractures.  Be given hormone replacement therapy (HRT) to treat symptoms of menopause. Follow these instructions at home: Lifestyle  Do not use any products that contain nicotine or tobacco, such as cigarettes, e-cigarettes, and chewing tobacco. If you need help quitting, ask your health care provider.  Do not use street drugs.  Do not share needles.  Ask your health care provider for help if you need support or information about quitting drugs. Alcohol use  Do not drink alcohol if: ? Your health care provider tells you not to drink. ? You are pregnant, may be pregnant, or are planning to become pregnant.  If you drink alcohol: ? Limit how much you use to 0-1 drink a day. ? Limit intake if you are breastfeeding.  Be aware of how much alcohol is in your drink. In the [place] one drink equals one 12 oz bottle of beer (355 mL), one 5 oz glass of wine (148 mL), or one 1 oz glass of hard liquor (44 mL). General instructions  Schedule regular health, dental, and eye exams.  Stay current with your vaccines.  Tell your health care provider if: ? You often feel depressed. ? You have ever been abused or do not feel safe at home. Summary  Adopting a healthy lifestyle and getting preventive care are important in promoting health and wellness.  Follow your health care provider's instructions about healthy  diet, exercising, and getting tested or screened for diseases.  Follow your health care provider's instructions on monitoring your cholesterol and blood pressure. This information is not intended to replace advice given to you by your health care provider. Make sure you discuss any questions you have with your health care provider. Document Revised: 06/14/2018 Document Reviewed: 06/14/2018 Elsevier Patient Education  2020 Elsevier Inc.  

## 2019-09-19 LAB — CERVICOVAGINAL ANCILLARY ONLY
Bacterial Vaginitis (gardnerella): POSITIVE — AB
Candida Glabrata: NEGATIVE
Candida Vaginitis: NEGATIVE
Chlamydia: NEGATIVE
Comment: NEGATIVE
Comment: NEGATIVE
Comment: NEGATIVE
Comment: NEGATIVE
Comment: NEGATIVE
Comment: NORMAL
Neisseria Gonorrhea: NEGATIVE
Trichomonas: NEGATIVE

## 2019-09-20 ENCOUNTER — Other Ambulatory Visit: Payer: Self-pay

## 2019-09-20 ENCOUNTER — Telehealth: Payer: Self-pay

## 2019-09-20 DIAGNOSIS — B9689 Other specified bacterial agents as the cause of diseases classified elsewhere: Secondary | ICD-10-CM

## 2019-09-20 DIAGNOSIS — N760 Acute vaginitis: Secondary | ICD-10-CM

## 2019-09-20 MED ORDER — METRONIDAZOLE 500 MG PO TABS: 500.0000 mg | ORAL_TABLET | Freq: Two times a day (BID) | ORAL | 0 refills | Status: DC

## 2019-09-20 NOTE — Telephone Encounter (Signed)
TC to pt to make aware of results  Pt not ava and vm is full Sent pt a Mychart message since I saw pt has reviewed results and provider message that was sent to her.

## 2019-09-20 NOTE — Telephone Encounter (Signed)
-----   Message from Hermina Staggers, MD sent at 09/20/2019  9:22 AM EDT ----- Please sent Rx for Flagyl for BV to pt's pharmacy Pt is aware

## 2020-03-19 ENCOUNTER — Other Ambulatory Visit: Payer: Self-pay

## 2020-03-19 ENCOUNTER — Other Ambulatory Visit: Payer: Commercial Managed Care - PPO

## 2020-03-19 DIAGNOSIS — Z20822 Contact with and (suspected) exposure to covid-19: Secondary | ICD-10-CM

## 2020-03-22 LAB — NOVEL CORONAVIRUS, NAA: SARS-CoV-2, NAA: NOT DETECTED

## 2020-07-01 ENCOUNTER — Other Ambulatory Visit: Payer: Self-pay

## 2020-07-01 ENCOUNTER — Emergency Department (HOSPITAL_COMMUNITY)
Admission: EM | Admit: 2020-07-01 | Discharge: 2020-07-01 | Disposition: A | Discharge status: Home / Self Care | Payer: Commercial Managed Care - PPO | Attending: Emergency Medicine | Admitting: Emergency Medicine

## 2020-07-01 DIAGNOSIS — Z794 Long term (current) use of insulin: Secondary | ICD-10-CM | POA: Insufficient documentation

## 2020-07-01 DIAGNOSIS — E119 Type 2 diabetes mellitus without complications: Secondary | ICD-10-CM | POA: Diagnosis not present

## 2020-07-01 DIAGNOSIS — U071 COVID-19: Secondary | ICD-10-CM | POA: Diagnosis not present

## 2020-07-01 DIAGNOSIS — R519 Headache, unspecified: Secondary | ICD-10-CM | POA: Diagnosis present

## 2020-07-01 DIAGNOSIS — F17210 Nicotine dependence, cigarettes, uncomplicated: Secondary | ICD-10-CM | POA: Insufficient documentation

## 2020-07-01 DIAGNOSIS — Z7984 Long term (current) use of oral hypoglycemic drugs: Secondary | ICD-10-CM | POA: Diagnosis not present

## 2020-07-01 LAB — URINALYSIS, ROUTINE W REFLEX MICROSCOPIC
Bacteria, UA: NONE SEEN
Bilirubin Urine: NEGATIVE
Glucose, UA: 50 mg/dL — AB
Hgb urine dipstick: NEGATIVE
Ketones, ur: NEGATIVE mg/dL
Leukocytes,Ua: NEGATIVE
Nitrite: NEGATIVE
Protein, ur: 100 mg/dL — AB
Specific Gravity, Urine: 1.026 (ref 1.005–1.030)
pH: 6 (ref 5.0–8.0)

## 2020-07-01 LAB — RESP PANEL BY RT-PCR (FLU A&B, COVID) ARPGX2
Influenza A by PCR: NEGATIVE
Influenza B by PCR: NEGATIVE
SARS Coronavirus 2 by RT PCR: POSITIVE — AB

## 2020-07-01 LAB — PREGNANCY, URINE: Preg Test, Ur: NEGATIVE

## 2020-07-01 MED ORDER — IBUPROFEN 800 MG PO TABS
800.0000 mg | ORAL_TABLET | Freq: Once | ORAL | Status: AC
  Administered 2020-07-01: 800 mg via ORAL
  Filled 2020-07-01: qty 1

## 2020-07-01 NOTE — ED Notes (Signed)
RN explained that patient was to be swabbed for COVID based on her symptoms. While RN was swabbing patient, patient grabbed RN's arm with the swab still in the nose. Patient yelled at RN to take it out and RN calmly explained that the swab was almost done.  Patient then became agitated after swab was completed and stated that the swab had been done incorrectly as she had "never had it done that way before" RN explained that the nasopharyngeal swabs were uncomfortable and did go far back and that they had to to be accurate. Improperly done swabs lead to false negative results. Patient not satisfied with this response.

## 2020-07-01 NOTE — ED Provider Notes (Signed)
[facility]-EMERGENCY DEPT Provider Note   CSN: [id number] Arrival date & time: 07/01/20  1245     History Chief Complaint  Patient presents with  . Headache  . Back Pain    [name] [name] is a 29 y.o. female.  Patient is a 29 year old female with past medical history of diabetes, Cushing's syndrome, and polycystic ovaries.  She presents today for evaluation of headache, neck pain, back pain, and cough.  This has been worsening over the past 2 days.  She denies dysuria, but describes dark urine.  Patient denies any known exposures to Covid, but denies having received her vaccine.  The history is provided by the patient.       Past Medical History:  Diagnosis Date  . Cellulitis of right leg 08/14/2019  . Cushing syndrome (HCC)   . Diabetes mellitus    Type II  . Gonorrhea   . Hirsutism   . PCOS (polycystic ovarian syndrome)   . Vaginal dryness 04/20/2019  . Yeast vaginitis 04/20/2019    Patient Active Problem List   Diagnosis Date Noted  . Vaginal discharge 09/18/2019  . Implanon in place 09/18/2019  . Screening for STD (sexually transmitted disease) 04/20/2019  . 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 Uncontrolled diabetes mellitus (HCC) 06/04/2013  . Cushings syndrome (HCC) 01/08/2013  . PCOS (polycystic ovarian syndrome) 01/08/2013    Past Surgical History:  Procedure Laterality Date  . ABSCESS DRAINAGE    . THERAPEUTIC ABORTION    . TOOTH EXTRACTION       OB History    Gravida  3   Para  1   Term  1   Preterm  0   AB  2   Living  1     SAB  1   IAB  1   Ectopic  0   Multiple  0   Live Births  1           Family History  Problem Relation Age of Onset  . Epilepsy Father   . Cancer Father        lung cancer  . COPD Maternal Grandmother   . Hypertension Maternal Grandmother   . Heart disease Maternal Grandmother   . Cancer Paternal Grandfather     Social History   Tobacco Use  . Smoking status:  Current Every Day Smoker    Packs/day: 0.25    Types: Cigarettes  . Smokeless tobacco: Never Used  Vaping Use  . Vaping Use: Never used  Substance Use Topics  . Alcohol use: Not Currently  . Drug use: No    Home Medications Prior to Admission medications   Medication Sig Start Date End Date Taking? Authorizing Provider  insulin glargine (LANTUS) 100 UNIT/ML injection Inject 0.15 mLs (15 Units total) into the skin at bedtime. Patient not taking: Reported on 09/18/2019 01/16/18   Derwood Kaplan, MD  metFORMIN (GLUCOPHAGE) 500 MG tablet Take 1 tablet (500 mg total) by mouth 2 (two) times daily with a meal. 01/16/18   Derwood Kaplan, MD  metroNIDAZOLE (FLAGYL) 500 MG tablet Take 1 tablet (500 mg total) by mouth 2 (two) times daily. 09/20/19   Hermina Staggers, MD  polyethylene glycol (MIRALAX / GLYCOLAX) 17 g packet Take 17 g by mouth daily. Patient not taking: Reported on 08/10/2019 04/25/19   Fayrene Helper, PA-C  TOUJEO MAX SOLOSTAR 300 UNIT/ML Solostar Pen INJECT 0.11 MLS (33 UNITS) SUBCUTANEOUSLY AT BEDTIME CAN RAISE UP TO 60 UNITS DAY 09/07/19  [provider]    Allergies    Lisinopril  Review of Systems   Review of Systems  All other systems reviewed and are negative.   Physical Exam Updated Vital Signs BP (!) 133/92 (BP Location: Right Arm)   Pulse 100   Temp 99.2 F (37.3 C) (Oral)   Resp 17   LMP  (LMP Unknown)   SpO2 98%   Physical Exam Vitals and nursing note reviewed.  Constitutional:      General: She is not in acute distress.    Appearance: She is well-developed and well-nourished. She is not diaphoretic.  HENT:     Head: Normocephalic and atraumatic.  Eyes:     General: No visual field deficit.    Extraocular Movements: Extraocular movements intact.     Pupils: Pupils are equal, round, and reactive to light.  Cardiovascular:     Rate and Rhythm: Normal rate and regular rhythm.     Heart sounds: No murmur heard. No friction rub. No gallop.    Pulmonary:     Effort: Pulmonary effort is normal. No respiratory distress.     Breath sounds: Normal breath sounds. No wheezing.  Abdominal:     General: Bowel sounds are normal. There is no distension.     Palpations: Abdomen is soft.     Tenderness: There is no abdominal tenderness.  Musculoskeletal:        General: Normal range of motion.     Cervical back: Normal range of motion and neck supple.  Skin:    General: Skin is warm and dry.  Neurological:     Mental Status: She is alert and oriented to person, place, and time.     Cranial Nerves: No cranial nerve deficit.     ED Results / Procedures / Treatments   Labs (all labs ordered are listed, but only abnormal results are displayed) Labs Reviewed  RESP PANEL BY RT-PCR (FLU A&B, COVID) ARPGX2 - Abnormal; Notable for the following components:      Result Value   SARS Coronavirus 2 by RT PCR POSITIVE (*)    All other components within normal limits  URINALYSIS, ROUTINE W REFLEX MICROSCOPIC  PREGNANCY, URINE    EKG None  Radiology No results found.  Procedures Procedures (including critical care time)  Medications Ordered in ED Medications - No data to display  ED Course  I have reviewed the triage vital signs and the nursing notes.  Pertinent labs & imaging results that were available during my care of the patient were reviewed by me and considered in my medical decision making (see chart for details).    MDM Rules/Calculators/A&P  Patient presenting with complaints of headache, cough, back pain, and body aches.  Covid test is positive.  I suspect this is the etiology of the symptoms.  Her urinalysis is clear.  Patient to be discharged with rotating Tylenol and Motrin.  A referral has been placed to the [facility].  Final Clinical Impression(s) / ED Diagnoses Final diagnoses:  None    Rx / DC Orders ED Discharge Orders    None       Geoffery Lyons, MD 07/01/20 [phone]

## 2020-07-01 NOTE — ED Notes (Signed)
Patient became angry in lobby stating that she "has been waiting since 59 [address] and people should not be going back before her" RN attempted to explain that the people I am calling are for fast track and not as likely to be admitted. Patient became more agitated and stated "Y'all have some rude ass nurses here. I am sick just like everyone else" RN made Charge RN aware. Patient placed in a fast track room to try and de-escalate situation.

## 2020-07-01 NOTE — ED Notes (Signed)
Patient given an icepack for her headache. Patient also given crackers and cheese.

## 2020-07-01 NOTE — ED Triage Notes (Signed)
Pt presents with c/o headache, neck, and back pain. Pt reports that her symptoms started yesterday, denies any injury.

## 2020-07-01 NOTE — Discharge Instructions (Addendum)
Take Tylenol 1000 mg rotated with ibuprofen 600 mg every 4 hours as needed for pain or fever.  Drink plenty of fluids and get plenty of rest.  Isolate at home for the next week.  Return to the ER if you develop severe chest pain, difficulty breathing, or other new and concerning symptoms.      Person Under Monitoring Name: [name] [name]  Location: [address] [place] [address]   Infection Prevention Recommendations for Individuals Confirmed to have, or Being Evaluated for, 2019 Novel Coronavirus (COVID-19) Infection Who Receive Care at Home  Individuals who are confirmed to have, or are being evaluated for, COVID-19 should follow the prevention steps below until a healthcare provider or local or state health department says they can return to normal activities.  Stay home except to get medical care You should restrict activities outside your home, except for getting medical care. Do not go to work, school, or public areas, and do not use public transportation or taxis.  Call ahead before visiting your doctor Before your medical appointment, call the healthcare provider and tell them that you have, or are being evaluated for, COVID-19 infection. This will help the healthcare provider's office take steps to keep other people from getting infected. Ask your healthcare provider to call the local or state health department.  Monitor your symptoms Seek prompt medical attention if your illness is worsening (e.g., difficulty breathing). Before going to your medical appointment, call the healthcare provider and tell them that you have, or are being evaluated for, COVID-19 infection. Ask your healthcare provider to call the local or state health department.  Wear a facemask You should wear a facemask that covers your nose and mouth when you are in the same room with other people and when you visit a healthcare provider. People who live with or visit you should also wear a  facemask while they are in the same room with you.  Separate yourself from other people in your home As much as possible, you should stay in a different room from other people in your home. Also, you should use a separate bathroom, if available.  Avoid sharing household items You should not share dishes, drinking glasses, cups, eating utensils, towels, bedding, or other items with other people in your home. After using these items, you should wash them thoroughly with soap and water.  Cover your coughs and sneezes Cover your mouth and nose with a tissue when you cough or sneeze, or you can cough or sneeze into your sleeve. Throw used tissues in a lined trash can, and immediately wash your hands with soap and water for at least 20 seconds or use an alcohol-based hand rub.  Wash your [employer] your hands often and thoroughly with soap and water for at least 20 seconds. You can use an alcohol-based hand sanitizer if soap and water are not available and if your hands are not visibly dirty. Avoid touching your eyes, nose, and mouth with unwashed hands.   Prevention Steps for Caregivers and Household Members of Individuals Confirmed to have, or Being Evaluated for, COVID-19 Infection Being Cared for in the Home  If you live with, or provide care at home for, a person confirmed to have, or being evaluated for, COVID-19 infection please follow these guidelines to prevent infection:  Follow healthcare provider's instructions Make sure that you understand and can help the patient follow any healthcare provider instructions for all care.  Provide for the patient's basic needs  You should help the patient with basic needs in the home and provide support for getting groceries, prescriptions, and other personal needs.  Monitor the patient's symptoms If they are getting sicker, call his or her medical provider and tell them that the patient has, or is being evaluated for, COVID-19 infection.  This will help the healthcare provider's office take steps to keep other people from getting infected. Ask the healthcare provider to call the local or state health department.  Limit the number of people who have contact with the patient If possible, have only one caregiver for the patient. Other household members should stay in another home or place of residence. If this is not possible, they should stay in another room, or be separated from the patient as much as possible. Use a separate bathroom, if available. Restrict visitors who do not have an essential need to be in the home.  Keep older adults, very young children, and other sick people away from the patient Keep older adults, very young children, and those who have compromised immune systems or chronic health conditions away from the patient. This includes people with chronic heart, lung, or kidney conditions, diabetes, and cancer.  Ensure good ventilation Make sure that shared spaces in the home have good air flow, such as from an air conditioner or an opened window, weather permitting.  Wash your hands often Wash your hands often and thoroughly with soap and water for at least 20 seconds. You can use an alcohol based hand sanitizer if soap and water are not available and if your hands are not visibly dirty. Avoid touching your eyes, nose, and mouth with unwashed hands. Use disposable paper towels to dry your hands. If not available, use dedicated cloth towels and replace them when they become wet.  Wear a facemask and gloves Wear a disposable facemask at all times in the room and gloves when you touch or have contact with the patient's blood, body fluids, and/or secretions or excretions, such as sweat, saliva, sputum, nasal mucus, vomit, urine, or feces.  Ensure the mask fits over your nose and mouth tightly, and do not touch it during use. Throw out disposable facemasks and gloves after using them. Do not reuse. Wash your hands  immediately after removing your facemask and gloves. If your personal clothing becomes contaminated, carefully remove clothing and launder. Wash your hands after handling contaminated clothing. Place all used disposable facemasks, gloves, and other waste in a lined container before disposing them with other household waste. Remove gloves and wash your hands immediately after handling these items.  Do not share dishes, glasses, or other household items with the patient Avoid sharing household items. You should not share dishes, drinking glasses, cups, eating utensils, towels, bedding, or other items with a patient who is confirmed to have, or being evaluated for, COVID-19 infection. After the person uses these items, you should wash them thoroughly with soap and water.  Wash laundry thoroughly Immediately remove and wash clothes or bedding that have blood, body fluids, and/or secretions or excretions, such as sweat, saliva, sputum, nasal mucus, vomit, urine, or feces, on them. Wear gloves when handling laundry from the patient. Read and follow directions on labels of laundry or clothing items and detergent. In general, wash and dry with the warmest temperatures recommended on the label.  Clean all areas the individual has used often Clean all touchable surfaces, such as counters, tabletops, doorknobs, bathroom fixtures, toilets, phones, keyboards, tablets, and bedside tables, every day. Also,  clean any surfaces that may have blood, body fluids, and/or secretions or excretions on them. Wear gloves when cleaning surfaces the patient has come in contact with. Use a diluted bleach solution (e.g., dilute bleach with 1 part bleach and 10 parts water) or a household disinfectant with a label that says EPA-registered for coronaviruses. To make a bleach solution at home, add 1 tablespoon of bleach to 1 quart (4 cups) of water. For a larger supply, add  cup of bleach to 1 gallon (16 cups) of water. Read  labels of cleaning products and follow recommendations provided on product labels. Labels contain instructions for safe and effective use of the cleaning product including precautions you should take when applying the product, such as wearing gloves or eye protection and making sure you have good ventilation during use of the product. Remove gloves and wash hands immediately after cleaning.  Monitor yourself for signs and symptoms of illness Caregivers and household members are considered close contacts, should monitor their health, and will be asked to limit movement outside of the home to the extent possible. Follow the monitoring steps for close contacts listed on the symptom monitoring form.   ? If you have additional questions, contact your local health department or call the epidemiologist on call at [phone] (available 24/7). ? This guidance is subject to change. For the most up-to-date guidance from [facility] please refer to their website: [employer]hu

## 2020-07-02 ENCOUNTER — Telehealth (HOSPITAL_COMMUNITY): Payer: Self-pay | Admitting: Family

## 2020-07-02 NOTE — Telephone Encounter (Signed)
Called to discuss with Heide Scales about Covid symptoms and potential candidacy for the use of sotrovimab, a combination monoclonal antibody infusion for those with mild to moderate Covid symptoms and at a high risk of hospitalization.     Pt is qualified for this infusion at the infusion center due to co-morbid conditions and/or a member of an at-risk group, however unable to reach patient. VM left.   Sarabella Caprio,NP

## 2020-10-13 ENCOUNTER — Other Ambulatory Visit: Payer: Self-pay

## 2020-10-13 ENCOUNTER — Emergency Department (HOSPITAL_COMMUNITY)
Admission: EM | Admit: 2020-10-13 | Discharge: 2020-10-13 | Disposition: A | Discharge status: Home / Self Care | Payer: Commercial Managed Care - PPO | Attending: Emergency Medicine | Admitting: Emergency Medicine

## 2020-10-13 ENCOUNTER — Emergency Department (HOSPITAL_COMMUNITY): Payer: Commercial Managed Care - PPO

## 2020-10-13 DIAGNOSIS — R0600 Dyspnea, unspecified: Secondary | ICD-10-CM | POA: Insufficient documentation

## 2020-10-13 DIAGNOSIS — R072 Precordial pain: Secondary | ICD-10-CM | POA: Insufficient documentation

## 2020-10-13 DIAGNOSIS — R0789 Other chest pain: Secondary | ICD-10-CM

## 2020-10-13 DIAGNOSIS — Z20822 Contact with and (suspected) exposure to covid-19: Secondary | ICD-10-CM | POA: Diagnosis not present

## 2020-10-13 DIAGNOSIS — Z794 Long term (current) use of insulin: Secondary | ICD-10-CM | POA: Diagnosis not present

## 2020-10-13 DIAGNOSIS — Z7984 Long term (current) use of oral hypoglycemic drugs: Secondary | ICD-10-CM | POA: Diagnosis not present

## 2020-10-13 DIAGNOSIS — F17210 Nicotine dependence, cigarettes, uncomplicated: Secondary | ICD-10-CM | POA: Insufficient documentation

## 2020-10-13 DIAGNOSIS — E119 Type 2 diabetes mellitus without complications: Secondary | ICD-10-CM | POA: Insufficient documentation

## 2020-10-13 DIAGNOSIS — Z28310 Unvaccinated for covid-19: Secondary | ICD-10-CM | POA: Insufficient documentation

## 2020-10-13 LAB — RESP PANEL BY RT-PCR (FLU A&B, COVID) ARPGX2
Influenza A by PCR: NEGATIVE
Influenza B by PCR: NEGATIVE
SARS Coronavirus 2 by RT PCR: NEGATIVE

## 2020-10-13 LAB — CBC WITH DIFFERENTIAL/PLATELET
Abs Immature Granulocytes: 0.02 10*3/uL (ref 0.00–0.07)
Basophils Absolute: 0 10*3/uL (ref 0.0–0.1)
Basophils Relative: 1 %
Eosinophils Absolute: 0.1 10*3/uL (ref 0.0–0.5)
Eosinophils Relative: 1 %
HCT: 37.6 % (ref 36.0–46.0)
Hemoglobin: 12.7 g/dL (ref 12.0–15.0)
Immature Granulocytes: 1 %
Lymphocytes Relative: 34 %
Lymphs Abs: 1.4 10*3/uL (ref 0.7–4.0)
MCH: 31.6 pg (ref 26.0–34.0)
MCHC: 33.8 g/dL (ref 30.0–36.0)
MCV: 93.5 fL (ref 80.0–100.0)
Monocytes Absolute: 0.5 10*3/uL (ref 0.1–1.0)
Monocytes Relative: 11 %
Neutro Abs: 2.2 10*3/uL (ref 1.7–7.7)
Neutrophils Relative %: 52 %
Platelets: 191 10*3/uL (ref 150–400)
RBC: 4.02 MIL/uL (ref 3.87–5.11)
RDW: 11.6 % (ref 11.5–15.5)
WBC: 4.2 10*3/uL (ref 4.0–10.5)
nRBC: 0 % (ref 0.0–0.2)

## 2020-10-13 LAB — COMPREHENSIVE METABOLIC PANEL
ALT: 14 U/L (ref 0–44)
AST: 12 U/L — ABNORMAL LOW (ref 15–41)
Albumin: 3.4 g/dL — ABNORMAL LOW (ref 3.5–5.0)
Alkaline Phosphatase: 82 U/L (ref 38–126)
Anion gap: 10 (ref 5–15)
BUN: 9 mg/dL (ref 6–20)
CO2: 23 mmol/L (ref 22–32)
Calcium: 9 mg/dL (ref 8.9–10.3)
Chloride: 105 mmol/L (ref 98–111)
Creatinine, Ser: 0.58 mg/dL (ref 0.44–1.00)
GFR, Estimated: >60 mL/min (ref >60)
Glucose, Bld: 293 mg/dL — ABNORMAL HIGH (ref 70–99)
Potassium: 3.5 mmol/L (ref 3.5–5.1)
Sodium: 138 mmol/L (ref 135–145)
Total Bilirubin: 1 mg/dL (ref 0.3–1.2)
Total Protein: 6.8 g/dL (ref 6.5–8.1)

## 2020-10-13 LAB — TROPONIN I (HIGH SENSITIVITY)
Troponin I (High Sensitivity): <2 ng/L (ref <18)
Troponin I (High Sensitivity): <2 ng/L (ref <18)

## 2020-10-13 LAB — BRAIN NATRIURETIC PEPTIDE: B Natriuretic Peptide: 9.8 pg/mL (ref 0.0–100.0)

## 2020-10-13 LAB — HCG, QUANTITATIVE, PREGNANCY: hCG, Beta Chain, Quant, S: <1 m[IU]/mL (ref <5)

## 2020-10-13 LAB — D-DIMER, QUANTITATIVE: D-Dimer, Quant: 0.37 ug/mL-FEU (ref 0.00–0.50)

## 2020-10-13 LAB — SEDIMENTATION RATE: Sed Rate: 28 mm/hr — ABNORMAL HIGH (ref 0–22)

## 2020-10-13 MED ORDER — KETOROLAC TROMETHAMINE 30 MG/ML IJ SOLN: 15.0000 mg | Freq: Once | INTRAMUSCULAR | Status: DC

## 2020-10-13 MED ORDER — ALBUTEROL SULFATE (2.5 MG/3ML) 0.083% IN NEBU
5.0000 mg | INHALATION_SOLUTION | Freq: Once | RESPIRATORY_TRACT | Status: AC
  Administered 2020-10-13: 5 mg via RESPIRATORY_TRACT
  Filled 2020-10-13: qty 6

## 2020-10-13 MED ORDER — NAPROXEN 500 MG PO TABS: 500.0000 mg | ORAL_TABLET | Freq: Two times a day (BID) | ORAL | 0 refills | Status: AC

## 2020-10-13 MED ORDER — SODIUM CHLORIDE 0.9 % IV BOLUS
500.0000 mL | Freq: Once | INTRAVENOUS | Status: AC
  Administered 2020-10-13: 500 mL via INTRAVENOUS

## 2020-10-13 MED ORDER — KETOROLAC TROMETHAMINE 15 MG/ML IJ SOLN
15.0000 mg | Freq: Once | INTRAMUSCULAR | Status: AC
  Administered 2020-10-13: 15 mg via INTRAVENOUS
  Filled 2020-10-13: qty 1

## 2020-10-13 NOTE — ED Triage Notes (Signed)
Pt presents for eval of central chest pain, nasal congestion, shortness of breath, and cough x 3 days. Denies sick contacts. Chest pain worse with deep breaths.

## 2020-10-13 NOTE — Discharge Instructions (Addendum)
As discussed, today's evaluation has been largely reassuring. There are some suspicion for inflammatory disorder contributing to your pain and difficulty breathing.  Please take all the as directed, and do not hesitate to return here for concerning changes.  Otherwise follow-up with your physician for appropriate ongoing outpatient follow-up.

## 2020-10-13 NOTE — ED Notes (Signed)
Went in to pt room to draw troponin. Pt refused with me and took of her cuff saying it is to tight. Told RN

## 2020-10-13 NOTE — ED Provider Notes (Signed)
[facility] EMERGENCY DEPARTMENT Provider Note   CSN: [id number] Arrival date & time: 10/13/20  1761     History Chief Complaint  Patient presents with  . Shortness of Breath  . Chest Pain    [name] [name] is a 30 y.o. female.  HPI Patient with a history of PCOS, diabetes, Cushing's syndrome, now presents with dyspnea and chest pain.  Onset was 3 days ago.  No clear precipitant.  Since onset symptoms been progressive.  She has tried Mucinex for relief, without any change in her condition. No fever, no vomiting, no diarrhea, no abdominal pain.  Chest pain is suprasternal, nonradiating, pressure-like. No recent medication change, activity change, diet change. Patient has not received any Covid vaccines. She does smoke cigarettes.    Past Medical History:  Diagnosis Date  . Cellulitis of right leg 08/14/2019  . Cushing syndrome (HCC)   . Diabetes mellitus    Type II  . Gonorrhea   . Hirsutism   . PCOS (polycystic ovarian syndrome)   . Vaginal dryness 04/20/2019  . Yeast vaginitis 04/20/2019    Patient Active Problem List   Diagnosis Date Noted  . Vaginal discharge 09/18/2019  . Implanon in place 09/18/2019  . Screening for STD (sexually transmitted disease) 04/20/2019  . 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 Uncontrolled diabetes mellitus (HCC) 06/04/2013  . Cushings syndrome (HCC) 01/08/2013  . PCOS (polycystic ovarian syndrome) 01/08/2013    Past Surgical History:  Procedure Laterality Date  . ABSCESS DRAINAGE    . THERAPEUTIC ABORTION    . TOOTH EXTRACTION       OB History    Gravida  3   Para  1   Term  1   Preterm  0   AB  2   Living  1     SAB  1   IAB  1   Ectopic  0   Multiple  0   Live Births  1           Family History  Problem Relation Age of Onset  . Epilepsy Father   . Cancer Father        lung cancer  . COPD Maternal Grandmother   . Hypertension Maternal Grandmother   . Heart disease Maternal  Grandmother   . Cancer Paternal Grandfather     Social History   Tobacco Use  . Smoking status: Current Every Day Smoker    Packs/day: 0.25    Types: Cigarettes  . Smokeless tobacco: Never Used  Vaping Use  . Vaping Use: Never used  Substance Use Topics  . Alcohol use: Not Currently  . Drug use: No    Home Medications Prior to Admission medications   Medication Sig Start Date End Date Taking? Authorizing Provider  insulin glargine (LANTUS) 100 UNIT/ML injection Inject 0.15 mLs (15 Units total) into the skin at bedtime. Patient taking differently: Inject 50 Units into the skin at bedtime. 01/16/18   Derwood Kaplan, MD  metFORMIN (GLUCOPHAGE) 500 MG tablet Take 1 tablet (500 mg total) by mouth 2 (two) times daily with a meal. Patient not taking: Reported on 07/01/2020 01/16/18   Derwood Kaplan, MD  metroNIDAZOLE (FLAGYL) 500 MG tablet Take 1 tablet (500 mg total) by mouth 2 (two) times daily. Patient not taking: No sig reported 09/20/19   Hermina Staggers, MD  polyethylene glycol (MIRALAX / GLYCOLAX) 17 g packet Take 17 g by mouth daily. Patient not taking: No sig reported 04/25/19  Fayrene Helper, PA-C  Semaglutide, 1 MG/DOSE, (OZEMPIC, 1 MG/DOSE,) 4 MG/3ML SOPN Inject 1.25 mg into the skin once a week. 01/16/20   [provider]  sertraline (ZOLOFT) 50 MG tablet Take 50 mg by mouth daily.    [provider]  TOUJEO MAX SOLOSTAR 300 UNIT/ML Solostar Pen INJECT 0.11 MLS (33 UNITS) SUBCUTANEOUSLY AT BEDTIME CAN RAISE UP TO 60 UNITS DAY Patient not taking: No sig reported 09/07/19   [provider]    Allergies    Lisinopril  Review of Systems   Review of Systems  Constitutional:       Per HPI, otherwise negative  HENT:       Per HPI, otherwise negative  Respiratory:       Per HPI, otherwise negative  Cardiovascular:       Per HPI, otherwise negative  Gastrointestinal: Negative for vomiting.  Endocrine:       Negative aside from HPI  Genitourinary:        Neg aside from HPI   Musculoskeletal:       Per HPI, otherwise negative  Skin: Negative.   Neurological: Positive for weakness. Negative for syncope.    Physical Exam Updated Vital Signs BP 109/76 (BP Location: Right Arm)   Pulse 83   Temp 98.1 F (36.7 C) (Oral)   Resp 14   Ht 5\' 7"  (1.702 m)   Wt 127 kg   SpO2 98%   BMI 43.85 kg/m   Physical Exam Vitals and nursing note reviewed.  Constitutional:      General: She is not in acute distress.    Appearance: She is well-developed.  HENT:     Head: Normocephalic and atraumatic.  Eyes:     Conjunctiva/sclera: Conjunctivae normal.  Cardiovascular:     Rate and Rhythm: Normal rate and regular rhythm.  Pulmonary:     Effort: Pulmonary effort is normal. Tachypnea present.     Breath sounds: Decreased breath sounds present. No wheezing.  Abdominal:     General: There is no distension.  Skin:    General: Skin is warm and dry.  Neurological:     Mental Status: She is alert and oriented to person, place, and time.     Cranial Nerves: No cranial nerve deficit.      ED Results / Procedures / Treatments   Labs (all labs ordered are listed, but only abnormal results are displayed) Labs Reviewed  COMPREHENSIVE METABOLIC PANEL - Abnormal; Notable for the following components:      Result Value   Glucose, Bld 293 (*)    Albumin 3.4 (*)    AST 12 (*)    All other components within normal limits  RESP PANEL BY RT-PCR (FLU A&B, COVID) ARPGX2  CBC WITH DIFFERENTIAL/PLATELET  D-DIMER, QUANTITATIVE  BRAIN NATRIURETIC PEPTIDE  HCG, QUANTITATIVE, PREGNANCY  SEDIMENTATION RATE  I-STAT BETA HCG BLOOD, ED ([facility], [facility], [facility] ONLY)  TROPONIN I (HIGH SENSITIVITY)  TROPONIN I (HIGH SENSITIVITY)    EKG EKG Interpretation  Date/Time:  Monday October 13 2020 07:29:07 EDT Ventricular Rate:  90 PR Interval:  156 QRS Duration: 93 QT Interval:  363 QTC Calculation: 445 R Axis:   31 Text Interpretation: Sinus rhythm Baseline wander in  lead(s) III V2 Abnormal ECG Confirmed by 12-11-2001 [phone]) on 10/13/2020 8:18:12 AM   Radiology DG Chest Port 1 View  Result Date: 10/13/2020 CLINICAL DATA:  Chest pain, shortness of breath and cough. EXAM: PORTABLE CHEST 1 VIEW COMPARISON:  04/01/2019 FINDINGS:  The heart size and mediastinal contours are within normal limits. There is no evidence of pulmonary edema, consolidation, pneumothorax or pleural fluid. The visualized skeletal structures are unremarkable. IMPRESSION: No active disease. Electronically Signed   By: Irish Lack M.D.   On: 10/13/2020 08:25    Procedures Procedures   Medications Ordered in ED Medications  sodium chloride 0.9 % bolus 500 mL (has no administration in time range)  albuterol (PROVENTIL) (2.5 MG/3ML) 0.083% nebulizer solution 5 mg (has no administration in time range)  ketorolac (TORADOL) 15 MG/ML injection 15 mg (has no administration in time range)    ED Course  I have reviewed the triage vital signs and the nursing notes. Cardiac monitor 90s, sinus, unremarkable Pulse oximetry 99% room air normal  Pertinent labs & imaging results that were available during my care of the patient were reviewed by me and considered in my medical decision making (see chart for details).    10:27 AM Patient improved, pain now 4/10.  D-dimer negative, Covid negative, x-ray without obvious pneumonia  2:00 PM Second troponin normal.  Sed rate not returned, but other findings reassuring, with no evidence for pregnancy, negative D-dimer, normal chest x-ray, unremarkable EKG, low suspicion for ACS, no evidence for PE, pneumonia, pneumothorax.  Patient has improved after receiving initial anti-inflammatories, there is suspicion for anti-inflammatories disorder contributing to her presentation.  Patient amenable to, appropriate for discharge with outpatient follow-up.  MDM Rules/Calculators/A&P MDM Number of Diagnoses or Management Options Atypical chest pain: new,  needed workup   Amount and/or Complexity of Data Reviewed Clinical lab tests: ordered and reviewed Tests in the radiology section of CPT: ordered and reviewed Tests in the medicine section of CPT: reviewed and ordered Decide to obtain previous medical records or to obtain history from someone other than the patient: yes Review and summarize past medical records: yes Independent visualization of images, tracings, or specimens: yes  Risk of Complications, Morbidity, and/or Mortality Presenting problems: high Diagnostic procedures: high Management options: high  Critical Care Total time providing critical care: < 30 minutes  Patient Progress Patient progress: stable  Final Clinical Impression(s) / ED Diagnoses Final diagnoses:  Atypical chest pain    Rx / DC Orders ED Discharge Orders         Ordered    naproxen (NAPROSYN) 500 MG tablet  2 times daily with meals        10/13/20 1400           Gerhard Munch, MD 10/13/20 1401

## 2021-01-24 IMAGING — CR DG CHEST 2V
2 series · 2 of 2 positions shown · non-contrast
Comparison: 10/14/2014

CLINICAL DATA: Intermittent pain in center of chest which began
yesterday.

EXAM:
CHEST - 2 VIEW

[w chest pa]
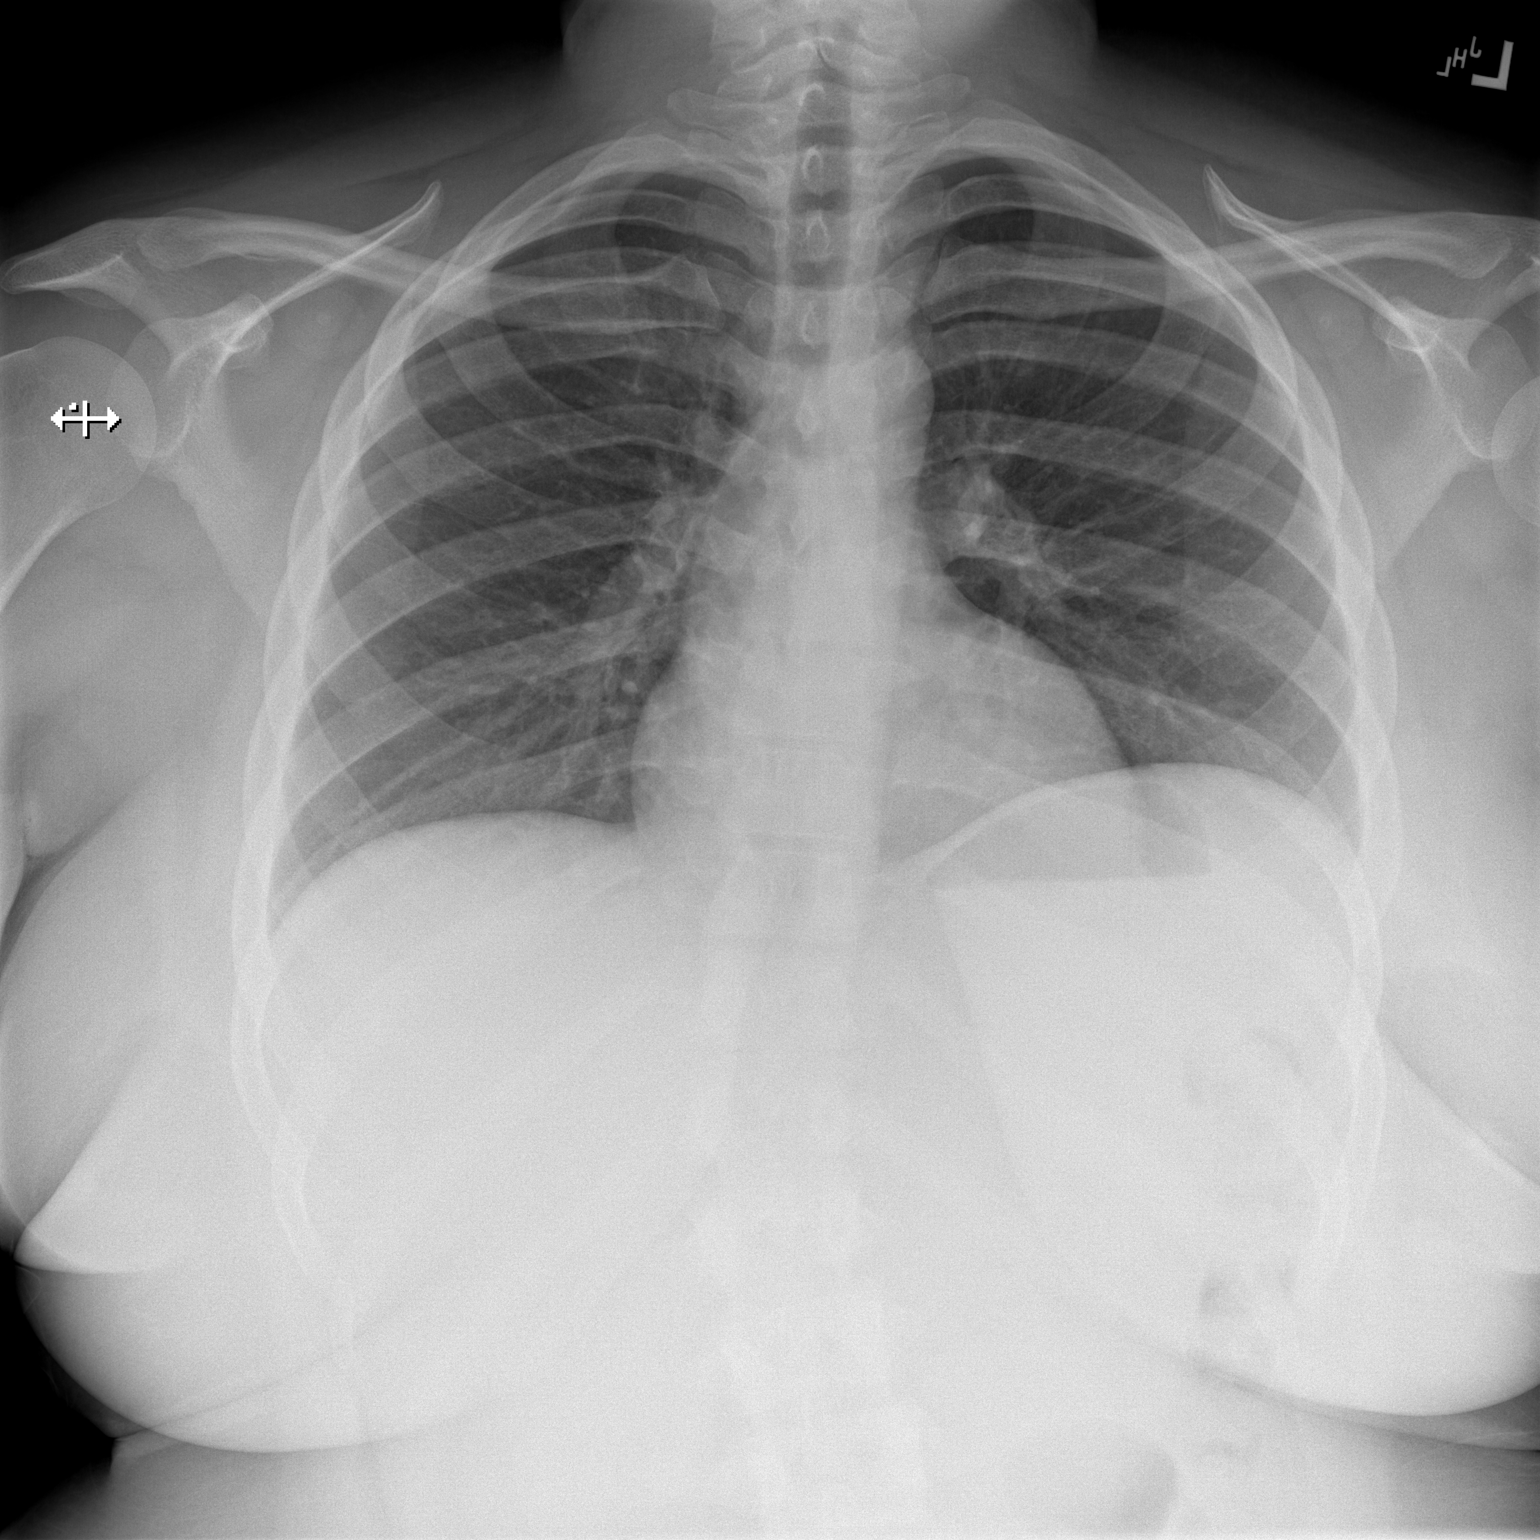

[w chest lat]
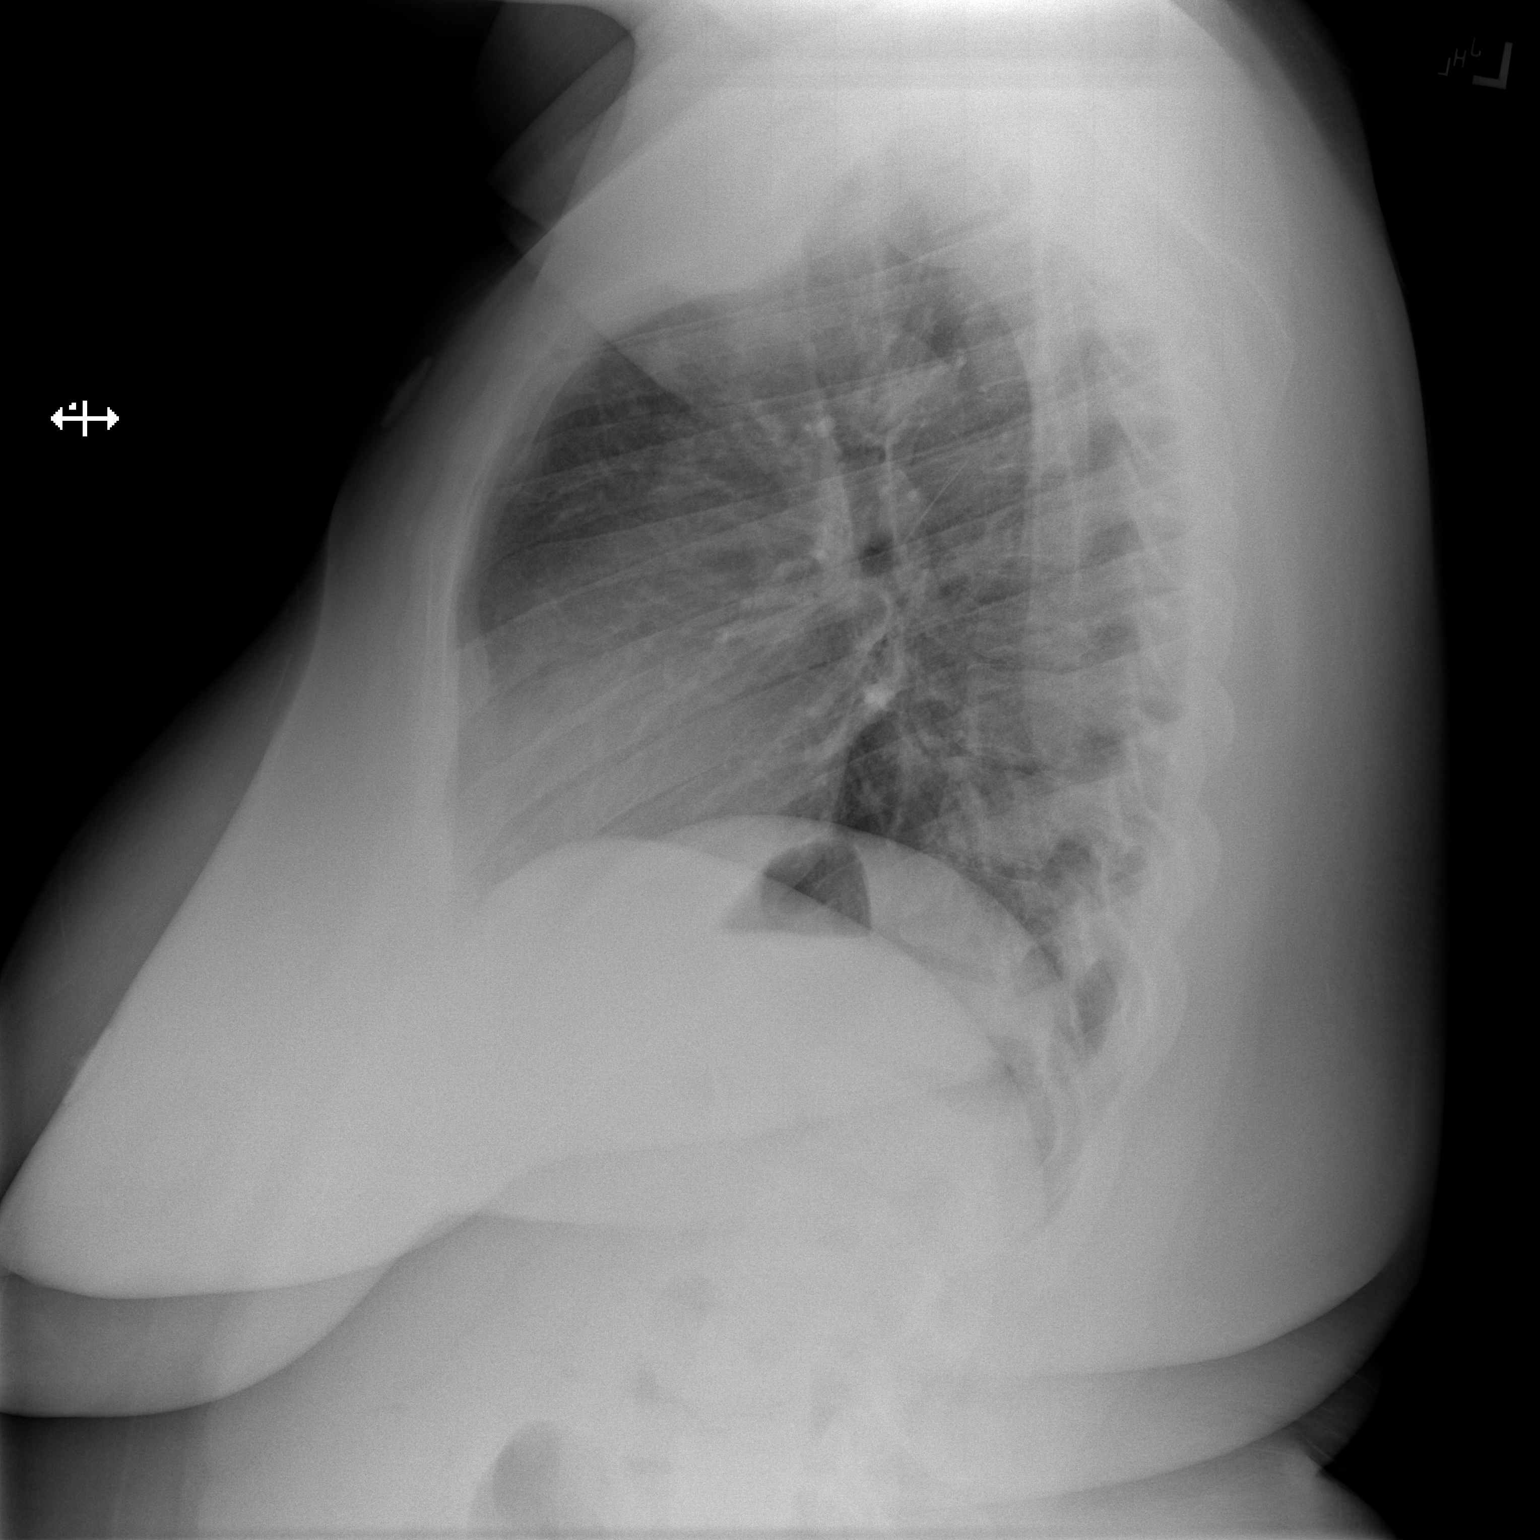

[2 of 2 positions shown; findings below may reference images not displayed]

FINDINGS: The heart size and mediastinal contours are within normal limits.
Both lungs are clear. The visualized skeletal structures are
unremarkable.
IMPRESSION: No active cardiopulmonary disease.

## 2021-02-20 ENCOUNTER — Emergency Department (HOSPITAL_COMMUNITY): Payer: Self-pay

## 2021-02-20 ENCOUNTER — Emergency Department (HOSPITAL_COMMUNITY)
Admission: EM | Admit: 2021-02-20 | Discharge: 2021-02-20 | Disposition: A | Discharge status: Home / Self Care | Payer: Self-pay | Attending: Emergency Medicine | Admitting: Emergency Medicine

## 2021-02-20 ENCOUNTER — Encounter (HOSPITAL_COMMUNITY): Payer: Self-pay

## 2021-02-20 ENCOUNTER — Other Ambulatory Visit: Payer: Self-pay

## 2021-02-20 DIAGNOSIS — N764 Abscess of vulva: Secondary | ICD-10-CM | POA: Insufficient documentation

## 2021-02-20 DIAGNOSIS — Z794 Long term (current) use of insulin: Secondary | ICD-10-CM | POA: Insufficient documentation

## 2021-02-20 DIAGNOSIS — Z7984 Long term (current) use of oral hypoglycemic drugs: Secondary | ICD-10-CM | POA: Insufficient documentation

## 2021-02-20 DIAGNOSIS — E119 Type 2 diabetes mellitus without complications: Secondary | ICD-10-CM | POA: Insufficient documentation

## 2021-02-20 DIAGNOSIS — F17210 Nicotine dependence, cigarettes, uncomplicated: Secondary | ICD-10-CM | POA: Insufficient documentation

## 2021-02-20 LAB — CBC WITH DIFFERENTIAL/PLATELET
Abs Immature Granulocytes: 0.04 10*3/uL (ref 0.00–0.07)
Basophils Absolute: 0 10*3/uL (ref 0.0–0.1)
Basophils Relative: 0 %
Eosinophils Absolute: 0.1 10*3/uL (ref 0.0–0.5)
Eosinophils Relative: 1 %
HCT: 38.8 % (ref 36.0–46.0)
Hemoglobin: 13.2 g/dL (ref 12.0–15.0)
Immature Granulocytes: 1 %
Lymphocytes Relative: 21 %
Lymphs Abs: 1.7 10*3/uL (ref 0.7–4.0)
MCH: 32.1 pg (ref 26.0–34.0)
MCHC: 34 g/dL (ref 30.0–36.0)
MCV: 94.4 fL (ref 80.0–100.0)
Monocytes Absolute: 0.5 10*3/uL (ref 0.1–1.0)
Monocytes Relative: 6 %
Neutro Abs: 5.8 10*3/uL (ref 1.7–7.7)
Neutrophils Relative %: 71 %
Platelets: 240 10*3/uL (ref 150–400)
RBC: 4.11 MIL/uL (ref 3.87–5.11)
RDW: 11.9 % (ref 11.5–15.5)
WBC: 8.2 10*3/uL (ref 4.0–10.5)
nRBC: 0 % (ref 0.0–0.2)

## 2021-02-20 LAB — BASIC METABOLIC PANEL
Anion gap: 8 (ref 5–15)
BUN: 11 mg/dL (ref 6–20)
CO2: 28 mmol/L (ref 22–32)
Calcium: 9.2 mg/dL (ref 8.9–10.3)
Chloride: 105 mmol/L (ref 98–111)
Creatinine, Ser: 0.62 mg/dL (ref 0.44–1.00)
GFR, Estimated: >60 mL/min (ref >60)
Glucose, Bld: 245 mg/dL — ABNORMAL HIGH (ref 70–99)
Potassium: 3.6 mmol/L (ref 3.5–5.1)
Sodium: 141 mmol/L (ref 135–145)

## 2021-02-20 LAB — CBG MONITORING, ED: Glucose-Capillary: 239 mg/dL — ABNORMAL HIGH (ref 70–99)

## 2021-02-20 MED ORDER — SODIUM CHLORIDE 0.9 % IV BOLUS
1000.0000 mL | Freq: Once | INTRAVENOUS | Status: AC
  Administered 2021-02-20: 1000 mL via INTRAVENOUS

## 2021-02-20 MED ORDER — IOHEXOL 350 MG/ML SOLN
80.0000 mL | Freq: Once | INTRAVENOUS | Status: AC | PRN
  Administered 2021-02-20: 80 mL via INTRAVENOUS

## 2021-02-20 MED ORDER — LIDOCAINE-EPINEPHRINE-TETRACAINE (LET) TOPICAL GEL
6.0000 mL | Freq: Once | TOPICAL | Status: AC
  Administered 2021-02-20: 6 mL via TOPICAL
  Filled 2021-02-20: qty 6

## 2021-02-20 MED ORDER — LIDOCAINE-EPINEPHRINE (PF) 2 %-1:200000 IJ SOLN
10.0000 mL | Freq: Once | INTRAMUSCULAR | Status: AC
  Administered 2021-02-20: 10 mL
  Filled 2021-02-20: qty 20

## 2021-02-20 MED ORDER — DOXYCYCLINE HYCLATE 100 MG PO CAPS: 100.0000 mg | ORAL_CAPSULE | Freq: Two times a day (BID) | ORAL | 0 refills | Status: AC

## 2021-02-20 NOTE — ED Provider Notes (Signed)
Emergency Medicine Provider Triage Evaluation Note  [name] [name] , a 30 y.o. female  was evaluated in triage.  Pt complains of right-sided vaginal abscess x3 days.  Patient states that the area is uncomfortable.  She also had an abscess on her chest that had recently broken open.  She is a diabetic.  Review of Systems  Positive: Abscess Negative: Fevers  Physical Exam  BP (!) 130/96 (BP Location: Left Arm)   Pulse 86   Temp 98 F (36.7 C) (Oral)   Resp 16   Ht 5\' 7"  (1.702 m)   Wt 121.7 kg   LMP 01/20/2021 (Approximate)   SpO2 100%   BMI 42.01 kg/m  Gen:   Awake, no distress   Resp:  Normal effort  MSK:   Moves extremities without difficulty  Other:    Medical Decision Making  Medically screening exam initiated at 12:21 PM.  Appropriate orders placed.  [name] [name] was informed that the remainder of the evaluation will be completed by another provider, this initial triage assessment does not replace that evaluation, and the importance of remaining in the ED until their evaluation is complete.     Heide Scales, PA-C 02/20/21 1222    02/22/21, MD 02/20/21 2020

## 2021-02-20 NOTE — ED Triage Notes (Signed)
Patient reports a vaginal abscess to the right labia x 3 days.

## 2021-02-20 NOTE — Discharge Instructions (Signed)
Ibuprofen and Tylenol for pain. Warm soaks may help the pain and drainage.

## 2021-02-20 NOTE — ED Provider Notes (Signed)
[name] Provider Note   CSN: [id number] Arrival date & time: 02/20/21  1119     History Chief Complaint  Patient presents with   vaginal abscess    [name] [name] is a 30 y.o. female with a past medical history of type 2 diabetes and recurrent abscesses presenting today with the complaint of a labial abscess.  She says that this abscess became noticeable on Tuesday however has rapidly grown causing severe pain.  She endorses a feeling of "hot and cold" but never took her temperature.  States that her blood sugar has been under control at home.  Is on insulin.  Denies any abnormal vaginal discharge or concern for STI.  No abdominal pain or associated pelvic pain. Has not noticed any drainage or warmth.  Tried warm compresses and NSAIDs at home with no help.  She reports a history of referral to ID due to previous bacteremia associated with an abscess.      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   Uncontrolled diabetes mellitus (HCC) 06/04/2013   Cushings syndrome (HCC) 01/08/2013   PCOS (polycystic ovarian syndrome) 01/08/2013    Past Surgical History:  Procedure Laterality Date   ABSCESS DRAINAGE     THERAPEUTIC ABORTION     TOOTH EXTRACTION       OB History     Gravida  3   Para  1   Term  1   Preterm  0   AB  2   Living  1      SAB  1   IAB  1   Ectopic  0   Multiple  0   Live Births  1           Family History  Problem Relation Age of Onset   Epilepsy Father    Cancer  Father        lung cancer   COPD Maternal Grandmother    Hypertension Maternal Grandmother    Heart disease Maternal Grandmother    Cancer Paternal Grandfather     Social History   Tobacco Use   Smoking status: Every Day    Packs/day: 0.15    Types: Cigarettes   Smokeless tobacco: Never  Vaping Use   Vaping Use: Never used  Substance Use Topics   Alcohol use: Not Currently   Drug use: Yes    Types: Marijuana    Home Medications Prior to Admission medications   Medication Sig Start Date End Date Taking? Authorizing Provider  insulin glargine (LANTUS) 100 UNIT/ML injection Inject 0.15 mLs (15 Units total) into the skin at bedtime. Patient taking differently: Inject 50 Units into the skin at bedtime. 01/16/18   Derwood Kaplan, MD  metFORMIN (GLUCOPHAGE) 500 MG tablet Take 1 tablet (500 mg total) by mouth 2 (two) times daily with a meal. Patient not taking: Reported on 07/01/2020 01/16/18   Derwood Kaplan, MD  metroNIDAZOLE (FLAGYL) 500 MG tablet Take 1 tablet (500 mg total) by mouth 2 (  two) times daily. Patient not taking: No sig reported 09/20/19   Hermina Staggers, MD  polyethylene glycol (MIRALAX / GLYCOLAX) 17 g packet Take 17 g by mouth daily. Patient not taking: No sig reported 04/25/19   Fayrene Helper, PA-C  Semaglutide, 1 MG/DOSE, (OZEMPIC, 1 MG/DOSE,) 4 MG/3ML SOPN Inject 1.25 mg into the skin once a week. 01/16/20   [provider]  sertraline (ZOLOFT) 50 MG tablet Take 50 mg by mouth daily.    [provider]  TOUJEO MAX SOLOSTAR 300 UNIT/ML Solostar Pen INJECT 0.11 MLS (33 UNITS) SUBCUTANEOUSLY AT BEDTIME CAN RAISE UP TO 60 UNITS DAY Patient not taking: No sig reported 09/07/19   [provider]    Allergies    Lisinopril  Review of Systems   Review of Systems  Constitutional:  Positive for chills and fever.  Respiratory:  Negative for chest tightness and stridor.   Cardiovascular:  Negative for chest pain.  Gastrointestinal:  Negative for  abdominal pain, constipation, diarrhea, nausea and vomiting.  Genitourinary:  Negative for dysuria, flank pain, pelvic pain, urgency and vaginal discharge.       Right labial inflammation and pain  Musculoskeletal:  Negative for back pain.  Neurological:  Negative for dizziness, syncope, weakness, light-headedness, numbness and headaches.  Psychiatric/Behavioral:  Negative for confusion.   All other systems reviewed and are negative.  Physical Exam Updated Vital Signs BP 123/79 (BP Location: Right Arm)   Pulse 81   Temp 98 F (36.7 C) (Oral)   Resp 18   Ht 5\' 7"  (1.702 m)   Wt 121.7 kg   LMP 01/20/2021 (Approximate)   SpO2 95%   BMI 42.01 kg/m   Physical Exam Vitals and nursing note reviewed.  Constitutional:      Appearance: Normal appearance.  HENT:     Head: Normocephalic and atraumatic.  Eyes:     General: No scleral icterus.    Conjunctiva/sclera: Conjunctivae normal.  Cardiovascular:     Rate and Rhythm: Normal rate and regular rhythm.     Heart sounds: No murmur heard. Pulmonary:     Effort: Pulmonary effort is normal. No respiratory distress.  Abdominal:     General: Abdomen is flat.     Palpations: Abdomen is soft.     Tenderness: There is no abdominal tenderness.  Genitourinary:    Vagina: No vaginal discharge.     Comments: Patient with a right labial fluctuant abscess with substantial induration and surrounding inflammation. Not warm to the touch but extremely tender. No associated oozing. No pelvic pain. Skin:    General: Skin is warm and dry.     Findings: No rash.  Neurological:     Mental Status: She is alert.  Psychiatric:        Mood and Affect: Mood normal.    ED Results / Procedures / Treatments   Labs (all labs ordered are listed, but only abnormal results are displayed) Labs Reviewed - No data to display  EKG None  Radiology CT PELVIS W CONTRAST  Result Date: 02/20/2021 CLINICAL DATA:  Right labial abscess EXAM: CT PELVIS WITH  CONTRAST TECHNIQUE: Multidetector CT imaging of the pelvis was performed using the standard protocol following the bolus administration of intravenous contrast. CONTRAST:  34mL OMNIPAQUE IOHEXOL 350 MG/ML SOLN COMPARISON:  None. FINDINGS: Urinary Tract: The distal ureters and bladder are unremarkable. No urinary tract calculi or obstruction. Bowel: No bowel obstruction or ileus. Normal appendix right lower quadrant. Scattered diverticulosis of the descending colon  without diverticulitis. No bowel wall thickening or inflammatory change. Vascular/Lymphatic: No significant vascular findings. There is an enlarged right external iliac chain lymph node reference image 31/2, measuring 17 mm in short axis. Borderline enlarged bilateral inguinal lymph nodes are also noted. Reproductive: The uterus and adnexal structures are grossly unremarkable. There is subcutaneous inflammatory change involving the right labia. There is suggestion of a small subcutaneous fluid collection within the right labia measuring approximately 1.4 x 2.0 cm on image 50. There is no extension of the inflammatory change to the deeper tissues or perineum. Other: No free intraperitoneal fluid or free gas. No abdominal wall hernia. Musculoskeletal: No acute or destructive bony lesions. 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 IMPRESSION: 1. Inflammatory changes within the right labia, with a 2.0 cm subcutaneous abscess or phlegmon as above. No extension of the inflammatory changes to the perineum or deeper soft tissues. 2. Reactive adenopathy within the bilateral inguinal regions and right external iliac chain. Electronically Signed   By: Sharlet Salina M.D.   On: 02/20/2021 17:50    Procedures Procedures   Medications Ordered in ED Medications  lidocaine-EPINEPHrine (XYLOCAINE W/EPI) 2 %-1:200000 (PF) injection 10 mL (10 mLs Infiltration Given 02/20/21 1506)  sodium chloride 0.9 % bolus 1,000 mL  (1,000 mLs Intravenous New Bag/Given 02/20/21 1645)  iohexol (OMNIPAQUE) 350 MG/ML injection 80 mL (80 mLs Intravenous Contrast Given 02/20/21 1717)  lidocaine-EPINEPHrine-tetracaine (LET) topical gel (6 mLs Topical Given 02/20/21 1813)    ED Course  I have reviewed the triage vital signs and the nursing notes.  Pertinent labs & imaging results that were available during my care of the patient were reviewed by me and considered in my medical decision making (see chart for details).  I&D performed by myself and Ala Dach PA after negative work-up.    MDM Rules/Calculators/A&P Patient is a 30 year old female who presented with a complaint of right labial abscess.  She has a history of multiple abscesses on different parts of her body due to poorly controlled diabetes.  Due to her complaints subjective fevers, hx of bacteremia and the intensity of her pain to palpitation I decided to pursue blood work and cultures additionally a CT pelvis was obtained to evaluate the extent of her abscess. CT showed a 2cm abscess with associated swelling. No extension to the perineum or deeper tissue.   Her work-up was assuring for no large scale infection.  I&D was performed and patient tolerated well.  We discussed the importance of finishing her antibiotics and at home care for the incision site.  Patient started on doxycycline 100mg  BID for 7 days for both strep pneumo and MRSA coverage.  Final Clinical Impression(s) / ED Diagnoses Final diagnoses:  Labial abscess    Rx / DC Orders Results and diagnoses were explained to the patient. Return precautions discussed in full. Patient had no additional questions and expressed complete understanding.     02/20/21 1934    02/22/21, MD 02/25/21 [phone]

## 2021-02-26 LAB — CULTURE, BLOOD (SINGLE)
Culture: NO GROWTH
Special Requests: ADEQUATE

## 2021-07-04 DIAGNOSIS — Z5321 Procedure and treatment not carried out due to patient leaving prior to being seen by health care provider: Secondary | ICD-10-CM | POA: Insufficient documentation

## 2021-07-04 DIAGNOSIS — U071 COVID-19: Secondary | ICD-10-CM | POA: Insufficient documentation

## 2021-07-05 ENCOUNTER — Other Ambulatory Visit: Payer: Self-pay

## 2021-07-05 ENCOUNTER — Emergency Department (HOSPITAL_BASED_OUTPATIENT_CLINIC_OR_DEPARTMENT_OTHER)
Admission: EM | Admit: 2021-07-05 | Discharge: 2021-07-05 | Disposition: A | Discharge status: Home / Self Care | Payer: Medicaid Other | Attending: Emergency Medicine | Admitting: Emergency Medicine

## 2021-07-05 ENCOUNTER — Encounter (HOSPITAL_BASED_OUTPATIENT_CLINIC_OR_DEPARTMENT_OTHER): Payer: Self-pay | Admitting: Emergency Medicine

## 2021-07-05 ENCOUNTER — Encounter (HOSPITAL_COMMUNITY): Payer: Self-pay

## 2021-07-05 ENCOUNTER — Emergency Department (HOSPITAL_COMMUNITY)
Admission: EM | Admit: 2021-07-05 | Discharge: 2021-07-05 | Disposition: A | Discharge status: Home / Self Care | Payer: Self-pay | Attending: Emergency Medicine | Admitting: Emergency Medicine

## 2021-07-05 DIAGNOSIS — E119 Type 2 diabetes mellitus without complications: Secondary | ICD-10-CM | POA: Insufficient documentation

## 2021-07-05 DIAGNOSIS — U071 COVID-19: Secondary | ICD-10-CM | POA: Insufficient documentation

## 2021-07-05 DIAGNOSIS — Z794 Long term (current) use of insulin: Secondary | ICD-10-CM | POA: Insufficient documentation

## 2021-07-05 LAB — CBC
HCT: 36.8 % (ref 36.0–46.0)
Hemoglobin: 12.3 g/dL (ref 12.0–15.0)
MCH: 30.7 pg (ref 26.0–34.0)
MCHC: 33.4 g/dL (ref 30.0–36.0)
MCV: 91.8 fL (ref 80.0–100.0)
Platelets: 166 10*3/uL (ref 150–400)
RBC: 4.01 MIL/uL (ref 3.87–5.11)
RDW: 12 % (ref 11.5–15.5)
WBC: 3.1 10*3/uL — ABNORMAL LOW (ref 4.0–10.5)
nRBC: 0 % (ref 0.0–0.2)

## 2021-07-05 LAB — BASIC METABOLIC PANEL
Anion gap: 10 (ref 5–15)
BUN: 10 mg/dL (ref 6–20)
CO2: 25 mmol/L (ref 22–32)
Calcium: 8.7 mg/dL — ABNORMAL LOW (ref 8.9–10.3)
Chloride: 102 mmol/L (ref 98–111)
Creatinine, Ser: 0.66 mg/dL (ref 0.44–1.00)
GFR, Estimated: >60 mL/min (ref >60)
Glucose, Bld: 270 mg/dL — ABNORMAL HIGH (ref 70–99)
Potassium: 3.6 mmol/L (ref 3.5–5.1)
Sodium: 137 mmol/L (ref 135–145)

## 2021-07-05 LAB — RESP PANEL BY RT-PCR (FLU A&B, COVID) ARPGX2
Influenza A by PCR: NEGATIVE
Influenza B by PCR: NEGATIVE
SARS Coronavirus 2 by RT PCR: POSITIVE — AB

## 2021-07-05 LAB — CBG MONITORING, ED: Glucose-Capillary: 284 mg/dL — ABNORMAL HIGH (ref 70–99)

## 2021-07-05 MED ORDER — IBUPROFEN 800 MG PO TABS: 800.0000 mg | ORAL_TABLET | Freq: Once | ORAL | Status: AC

## 2021-07-05 MED ORDER — OXYCODONE-ACETAMINOPHEN 5-325 MG PO TABS
1.0000 | ORAL_TABLET | ORAL | Status: DC | PRN
  Filled 2021-07-05: qty 1

## 2021-07-05 MED ORDER — IBUPROFEN 800 MG PO TABS
ORAL_TABLET | ORAL | Status: AC
  Administered 2021-07-05: 800 mg
  Filled 2021-07-05: qty 1

## 2021-07-05 MED ORDER — NIRMATRELVIR/RITONAVIR (PAXLOVID)TABLET: 3.0000 | ORAL_TABLET | Freq: Two times a day (BID) | ORAL | 0 refills | Status: AC

## 2021-07-05 MED ORDER — HYDROCODONE-ACETAMINOPHEN 5-325 MG PO TABS: 1.0000 | ORAL_TABLET | ORAL | 0 refills | Status: DC | PRN

## 2021-07-05 NOTE — ED Provider Notes (Signed)
[facility] Provider [facility]   CSN: [id number] Arrival date & time: 07/05/21  1012     History  Chief Complaint  Patient presents with   Weakness   URI    [name] [name] is a 31 y.o. female.  HPI Patient reports that yesterday morning she started getting body aches and feeling extremely fatigued.  She reports that she got a very bad sore throat and a headache as well.  She started developing a fever up to 100.2.  Patient reports that [facility] went [facility] yesterday evening but due to extreme long wait she ended up leaving.  Her testing did return positive for COVID patient reports that she still feels really poorly.  Patient reports she is had nasal congestion and that she has been having a dry cough.  No shortness of breath.  No vomiting or diarrhea.  She reports she aches all over her body and is very fatigued.  She has diabetes but did not take her Lantus last night due to being at the emergency department.    Home Medications Prior to Admission medications   Medication Sig Start Date End Date Taking? Authorizing Provider  HYDROcodone-acetaminophen (NORCO/VICODIN) 5-325 MG tablet Take 1 tablet by mouth every 4 (four) hours as needed for moderate pain or severe pain (Cough). 07/05/21  Yes Arby BarrettePfeiffer, Chandelle Harkey, MD  nirmatrelvir/ritonavir EUA (PAXLOVID) 20 x 150 MG & 10 x 100MG  TABS Take 3 tablets by mouth 2 (two) times daily for 5 days. Patient GFR is 60. Take nirmatrelvir (150 mg) two tablets twice daily for 5 days and ritonavir (100 mg) one tablet twice daily for 5 days. 07/05/21 07/10/21 Yes Corben Auzenne, Lebron ConnersMarcy, MD  insulin glargine (LANTUS) 100 UNIT/ML injection Inject 0.15 mLs (15 Units total) into the skin at bedtime. Patient taking differently: Inject 50 Units into the skin at bedtime. 01/16/18   Derwood KaplanNanavati, Ankit, MD  metFORMIN (GLUCOPHAGE) 500 MG tablet Take 1 tablet (500 mg total) by mouth 2 (two) times daily with a meal. Patient not taking: Reported on 07/01/2020  01/16/18   Derwood KaplanNanavati, Ankit, MD  metroNIDAZOLE (FLAGYL) 500 MG tablet Take 1 tablet (500 mg total) by mouth 2 (two) times daily. Patient not taking: Reported on 07/01/2020 09/20/19   Hermina StaggersErvin, Michael L, MD  polyethylene glycol (MIRALAX / GLYCOLAX) 17 g packet Take 17 g by mouth daily. Patient not taking: No sig reported 04/25/19   Fayrene Helperran, [place], PA-C  Semaglutide, 1 MG/DOSE, (OZEMPIC, 1 MG/DOSE,) 4 MG/3ML SOPN Inject 1.25 mg into the skin once a week. 01/16/20   [provider]  sertraline (ZOLOFT) 50 MG tablet Take 50 mg by mouth daily.    [provider]  TOUJEO MAX SOLOSTAR 300 UNIT/ML Solostar Pen INJECT 0.11 MLS (33 UNITS) SUBCUTANEOUSLY AT BEDTIME CAN RAISE UP TO 60 UNITS DAY Patient not taking: No sig reported 09/07/19   [provider]      Allergies    Lisinopril    Review of Systems   Review of Systems 10 systems reviewed and negative except as per HPI Physical Exam Updated Vital Signs BP (!) 151/91 (BP Location: Right Arm)    Pulse 76    Temp 100.2 F (37.9 C) (Oral)    Resp 18    LMP 06/28/2021 (Approximate)    SpO2 98%  Physical Exam Constitutional:      Comments: Patient is alert and nontoxic.  She is talking on the phone when I enter the room.  She does not have respiratory  distress.  HENT:     Head: Normocephalic and atraumatic.     Mouth/Throat:     Mouth: Mucous membranes are moist.     Pharynx: Oropharynx is clear.  Eyes:     Extraocular Movements: Extraocular movements intact.     Conjunctiva/sclera: Conjunctivae normal.  Cardiovascular:     Rate and Rhythm: Normal rate and regular rhythm.  Pulmonary:     Effort: Pulmonary effort is normal.     Breath sounds: Normal breath sounds.  Abdominal:     General: There is no distension.     Palpations: Abdomen is soft.     Tenderness: There is no abdominal tenderness. There is no guarding.  Musculoskeletal:        General: Normal range of motion.     Cervical back: Neck supple.     Right lower  leg: No edema.     Left lower leg: No edema.  Skin:    General: Skin is warm and dry.  Neurological:     General: No focal deficit present.     Mental Status: She is oriented to person, place, and time.     Coordination: Coordination normal.  Psychiatric:        Mood and Affect: Mood normal.    ED Results / Procedures / Treatments   Labs (all labs ordered are listed, but only abnormal results are displayed) Labs Reviewed  BASIC METABOLIC PANEL - Abnormal; Notable for the following components:      Result Value   Glucose, Bld 270 (*)    Calcium 8.7 (*)    All other components within normal limits  CBC - Abnormal; Notable for the following components:   WBC 3.1 (*)    All other components within normal limits  CBG MONITORING, ED - Abnormal; Notable for the following components:   Glucose-Capillary 284 (*)    All other components within normal limits    EKG None  Radiology No results found.  Procedures Procedures    Medications Ordered in ED Medications  oxyCODONE-acetaminophen (PERCOCET/ROXICET) 5-325 MG per tablet 1 tablet (has no administration in time range)  ibuprofen (ADVIL) tablet 800 mg (800 mg Oral Given 07/05/21 1110)    ED Course/ Medical Decision Making/ A&P                           Medical Decision Making  Patient has had generalized constitutional symptoms starting yesterday.  She does not show signs of clinical dehydration or respiratory distress.  Patient is a diabetic on insulin as well as oral agent.  She is obese.  With patient having positive risk factors and onset of COVID within the past 2 days, plan is to treat with Paxlovid.   GFR obtained and normal.  No signs of DKA.  Patient is tolerating oral intake.  At this time based on review of lab work and clinical condition, patient is stable for outpatient management of COVID.  Patient has been counseled on isolating for a total of 5 days from onset of symptoms.  Careful return precautions  reviewed. Final Clinical Impression(s) / ED Diagnoses Final diagnoses:  COVID    Rx / DC Orders ED Discharge Orders          Ordered    nirmatrelvir/ritonavir EUA (PAXLOVID) 20 x 150 MG & 10 x 100MG  TABS  2 times daily        07/05/21 1427    HYDROcodone-acetaminophen (NORCO/VICODIN) 5-325 MG tablet  Every 4 hours PRN        07/05/21 1427              Arby Barrette, MD 07/05/21 1435

## 2021-07-05 NOTE — ED Triage Notes (Signed)
Pt reports daughter has the flu. Pt woke up yesterday and wasn't feeling well, has congestion ,non productive cough, states her right hand is numb since yesterday at about 11pm

## 2021-07-05 NOTE — ED Notes (Signed)
Pt refused vital signs, states she is ready to go.Dc instructions reviewed with patient. Patient voiced understanding. Dc with belongings.  

## 2021-07-05 NOTE — ED Notes (Signed)
Pt is crying, stating she hurts all over. Pt declined percocet,wanted ibuprofen.

## 2021-07-05 NOTE — ED Triage Notes (Signed)
Pt reports generalized body aches beginning today. Pt also sts lower back pain with pain radiating down right leg.

## 2021-07-05 NOTE — ED Triage Notes (Signed)
Pt was tested at [facility] and is positive for covid, pt left before being seen last night.

## 2021-07-05 NOTE — ED Notes (Signed)
Pt requested to speak with the charge RN. She was asking for pain medication. I replied that we could offer her some tylenol. She refused. She states, "Isn't this an emergency department, ya'll obviously aren't working fast enough." I explained the triage process for determining emergencies and that we have checked her vitals and completed triage and she is stable to wait until we have a room. Patient states, "wow." No further requests.

## 2021-07-05 NOTE — Discharge Instructions (Signed)
1.  You have COVID.  Since you have diabetes and other risk factors, you are being prescribed Paxlovid.  You need to start this medication today.  Go and fill it at the pharmacy.  You take 3 tablets twice a day.  Start today's dose as an evening dose and then continue in the morning. 2.  You may take ibuprofen for fever and body aches.  Take per package instructions 400 mg every 6-8 hours.  If you have significant coughing or pain not controlled by ibuprofen, you may take 1 Vicodin tablet every 4 hours in addition to the ibuprofen for cough or severe sore throat. 3.  Continue to take your Lantus and metformin.  Monitor your blood sugars twice a day and keep a journal. 4.  See your doctor for recheck within the next 3 to 5 days.  Return to the emergency department if you are getting worse. 5.  Since your first symptomatic yesterday, you must isolate for 5 days starting yesterday.

## 2021-08-03 ENCOUNTER — Ambulatory Visit (INDEPENDENT_AMBULATORY_CARE_PROVIDER_SITE_OTHER): Payer: Medicaid Other | Admitting: Advanced Practice Midwife

## 2021-08-03 ENCOUNTER — Encounter: Payer: Self-pay | Admitting: Advanced Practice Midwife

## 2021-08-03 ENCOUNTER — Other Ambulatory Visit: Payer: Self-pay

## 2021-08-03 ENCOUNTER — Other Ambulatory Visit (HOSPITAL_COMMUNITY)
Admission: RE | Admit: 2021-08-03 | Discharge: 2021-08-03 | Disposition: A | Discharge status: Home / Self Care | Payer: Medicaid Other | Source: Ambulatory Visit | Attending: Advanced Practice Midwife | Admitting: Advanced Practice Midwife

## 2021-08-03 VITALS — BP 168/108 | HR 90 | Ht 67.0 in | Wt 270.0 lb

## 2021-08-03 DIAGNOSIS — Z113 Encounter for screening for infections with a predominantly sexual mode of transmission: Secondary | ICD-10-CM | POA: Insufficient documentation

## 2021-08-03 DIAGNOSIS — Z124 Encounter for screening for malignant neoplasm of cervix: Secondary | ICD-10-CM

## 2021-08-03 DIAGNOSIS — Z01419 Encounter for gynecological examination (general) (routine) without abnormal findings: Secondary | ICD-10-CM

## 2021-08-03 DIAGNOSIS — R030 Elevated blood-pressure reading, without diagnosis of hypertension: Secondary | ICD-10-CM

## 2021-08-03 DIAGNOSIS — Z3009 Encounter for other general counseling and advice on contraception: Secondary | ICD-10-CM | POA: Diagnosis not present

## 2021-08-03 DIAGNOSIS — Z3046 Encounter for surveillance of implantable subdermal contraceptive: Secondary | ICD-10-CM | POA: Diagnosis not present

## 2021-08-03 NOTE — Progress Notes (Signed)
Subjective:     [name] [name] is a 31 y.o. female here at [facility] for a routine exam and Nexplanon removal.  Current complaints: none. Nexplanon placed 07/2018 so due for removal. Wants to discuss other options today.  Personal health questionnaire reviewed: yes.  Do you have a primary care provider? yes Do you feel safe at home? yes  Flowsheet Row Office Visit from 07/18/2018 in [facility]  PHQ-2 Total Score 1       Health Maintenance Due  Topic Date Due   COVID-19 Vaccine (1) Never done   OPHTHALMOLOGY EXAM  Never done   FOOT EXAM  07/15/2017   URINE MICROALBUMIN  07/15/2017   HEMOGLOBIN A1C  09/08/2017   INFLUENZA VACCINE  02/02/2021   PAP SMEAR-Modifier  07/13/2021     Risk factors for chronic health problems: Smoking: Alchohol/how much: Pt BMI: There is no height or weight on file to calculate BMI.   Gynecologic History No LMP recorded. (Menstrual status: Irregular Periods). Contraception: Nexplanon Last Pap: 2020. Results were: normal Last mammogram: n/a.   Obstetric History OB History  Gravida Para Term Preterm AB Living  3 1 1  0 2 1  SAB IAB Ectopic Multiple Live Births  1 1 0 0 1    # Outcome Date GA Lbr Len/2nd Weight Sex Delivery Anes PTL Lv  3 IAB 03/04/18 [redacted]w[redacted]d         2 Term 07/20/13 [redacted]w[redacted]d / 03:27 9 lb 9.1 oz (4.34 kg) F Vag-Spont EPI  LIV     Birth Comments: 9 lbs. 9 oz. Infant born at [redacted] weeks gestational age to a 32 year old g 2 p 0 0 1 0 female. Gestation was complicated by in a controlled  class B. insulin dependent diabetes mellitus, polycystic ovary syndrome, Cushing syndrome Mother received General anesthesia primary cesarean section due to  loss of heart rate prior to delivery and thick meconium Nursery Course was complicated by severe hypoxic ischemic insult at birth requiring resuscitation,  treatment with therapeutic hypothermia. Growth and Development was recalled as  mild truncal hypotonia  according to physical therapy.  1 SAB 09/2012 [redacted]w[redacted]d            Birth Comments: System Generated. Please review and update pregnancy details.     The following portions of the patient's history were reviewed and updated as appropriate: allergies, current medications, past family history, past medical history, past social history, past surgical history, and problem list.  Review of Systems Pertinent items noted in HPI and remainder of comprehensive ROS otherwise negative.    Objective:   There were no vitals taken for this visit. VS reviewed, nursing note reviewed,  Constitutional: well developed, well nourished, no distress HEENT: normocephalic CV: normal rate Pulm/chest wall: normal effort Breast Exam:  With shared decision making, exam performed: right breast normal without mass, skin or nipple changes or axillary nodes, left breast normal without mass, skin or nipple changes or axillary nodes Abdomen: soft Neuro: alert and oriented x 3 Skin: warm, dry Psych: affect normal Pelvic exam: Performed: Cervix pink, visually closed, without lesion, scant white creamy discharge, vaginal walls and external genitalia normal Bimanual exam: Cervix 0/long/high, firm, anterior, neg CMT, uterus nontender, nonenlarged, adnexa without tenderness, enlargement, or mass    Nexplanon Removal Patient identified, informed consent performed, consent signed.   Appropriate time out taken. Nexplanon site identified.  Area prepped in usual sterile fashon. One ml of 1% lidocaine was used  to anesthetize the area at the distal end of the implant. A small stab incision was made right beside the implant on the distal portion.  The Nexplanon rod was grasped using hemostats and removed without difficulty.  There was minimal blood loss. There were no complications.  3 ml of 1% lidocaine was injected around the incision for post-procedure analgesia.  Steri-strips were applied over the small incision.  A pressure bandage  was applied to reduce any bruising.  The patient tolerated the procedure well and was given post procedure instructions.  Patient is planning to use condoms for contraception.  Sharen Counter, CNM 8:13 AM     Assessment/Plan:   1. Routine screening for STI (sexually transmitted infection)  - Cervicovaginal ancillary only( [place]) - HIV Antibody (routine testing w rflx) - RPR - Hepatitis C antibody  2. Well woman exam with routine gynecological exam   3. Cervical cancer screening  - Cytology - PAP( [place])  4. Encounter for counseling regarding contraception --Discussed pt contraceptive plans and reviewed contraceptive methods based on pt preferences and effectiveness.  Pt interested in Nuvaring or contraceptive patch but is hypertensive today. She denies any dx of HTN and reports she had COVID earlier this month and blood pressure was elevated then.  Pt desires Nexplanon removal now, will use condoms and/or abstinence for contraception, and plans to follow up with PCP. --She will call to make contraceptive appt after PCP follow up   5. Elevated blood pressure reading without diagnosis of hypertension --No hx HTN until this month, COVID in early January with HTN at that time.  --No h/a, no SOB or CP --Pt to f/u with PCP     Follow up in: 1  year  or as needed.   Sharen Counter, CNM 8:12 AM

## 2021-08-03 NOTE — Progress Notes (Signed)
Pt presents for annual and Nexplanon removal.  Unsure which BC she wants Nexplanon was inserted 2020 per pt  PHQ9=0 GAD7=6

## 2021-08-04 ENCOUNTER — Other Ambulatory Visit: Payer: Medicaid Other

## 2021-08-04 LAB — CERVICOVAGINAL ANCILLARY ONLY
Chlamydia: NEGATIVE
Comment: NEGATIVE
Comment: NORMAL
Neisseria Gonorrhea: NEGATIVE

## 2021-08-05 ENCOUNTER — Emergency Department (HOSPITAL_BASED_OUTPATIENT_CLINIC_OR_DEPARTMENT_OTHER): Payer: Self-pay | Admitting: Radiology

## 2021-08-05 ENCOUNTER — Emergency Department (HOSPITAL_BASED_OUTPATIENT_CLINIC_OR_DEPARTMENT_OTHER)
Admission: EM | Admit: 2021-08-05 | Discharge: 2021-08-05 | Disposition: A | Discharge status: Home / Self Care | Payer: Self-pay | Attending: Emergency Medicine | Admitting: Emergency Medicine

## 2021-08-05 ENCOUNTER — Other Ambulatory Visit: Payer: Self-pay

## 2021-08-05 ENCOUNTER — Encounter (HOSPITAL_BASED_OUTPATIENT_CLINIC_OR_DEPARTMENT_OTHER): Payer: Self-pay

## 2021-08-05 DIAGNOSIS — Z794 Long term (current) use of insulin: Secondary | ICD-10-CM | POA: Insufficient documentation

## 2021-08-05 DIAGNOSIS — M25531 Pain in right wrist: Secondary | ICD-10-CM | POA: Insufficient documentation

## 2021-08-05 DIAGNOSIS — Z7984 Long term (current) use of oral hypoglycemic drugs: Secondary | ICD-10-CM | POA: Insufficient documentation

## 2021-08-05 LAB — CYTOLOGY - PAP
Adequacy: ABSENT
Comment: NEGATIVE
Diagnosis: NEGATIVE
High risk HPV: NEGATIVE

## 2021-08-05 MED ORDER — ACETAMINOPHEN 325 MG PO TABS
650.0000 mg | ORAL_TABLET | Freq: Once | ORAL | Status: AC
  Administered 2021-08-05: 650 mg via ORAL
  Filled 2021-08-05: qty 2

## 2021-08-05 NOTE — ED Provider Notes (Signed)
[facility] EMERGENCY DEPT Provider Note   CSN: [id number] Arrival date & time: 08/05/21  0102     History  Chief Complaint  Patient presents with   Wrist Pain    [name] [name] is a 31 y.o. female.  Patient complaining of right wrist pain.  Describes aching pain at the first part of the thumb on her palm side.  Symptoms ongoing for the past 4 days.  Denies injury elsewhere no elbow pain no headache or neck pain no fever no cough no vomiting or diarrhea.      Home Medications Prior to Admission medications   Medication Sig Start Date End Date Taking? Authorizing Provider  HYDROcodone-acetaminophen (NORCO/VICODIN) 5-325 MG tablet Take 1 tablet by mouth every 4 (four) hours as needed for moderate pain or severe pain (Cough). 07/05/21   Arby Barrette, MD  insulin glargine (LANTUS) 100 UNIT/ML injection Inject 0.15 mLs (15 Units total) into the skin at bedtime. Patient taking differently: Inject 50 Units into the skin at bedtime. 01/16/18   Derwood Kaplan, MD  metFORMIN (GLUCOPHAGE) 500 MG tablet Take 1 tablet (500 mg total) by mouth 2 (two) times daily with a meal. 01/16/18   Derwood Kaplan, MD  metroNIDAZOLE (FLAGYL) 500 MG tablet Take 1 tablet (500 mg total) by mouth 2 (two) times daily. Patient not taking: Reported on 07/01/2020 09/20/19   Hermina Staggers, MD  polyethylene glycol (MIRALAX / GLYCOLAX) 17 g packet Take 17 g by mouth daily. Patient not taking: No sig reported 04/25/19   Fayrene Helper, PA-C  Semaglutide, 1 MG/DOSE, (OZEMPIC, 1 MG/DOSE,) 4 MG/3ML SOPN Inject 1.25 mg into the skin once a week. 01/16/20   [provider]  sertraline (ZOLOFT) 50 MG tablet Take 50 mg by mouth daily.    [provider]  TOUJEO MAX SOLOSTAR 300 UNIT/ML Solostar Pen INJECT 0.11 MLS (33 UNITS) SUBCUTANEOUSLY AT BEDTIME CAN RAISE UP TO 60 UNITS DAY Patient not taking: No sig reported 09/07/19   [provider]      Allergies    Lisinopril    Review of  Systems   Review of Systems  Constitutional:  Negative for fever.  HENT:  Negative for ear pain.   Eyes:  Negative for pain.  Respiratory:  Negative for cough.   Cardiovascular:  Negative for chest pain.  Gastrointestinal:  Negative for abdominal pain.  Genitourinary:  Negative for flank pain.  Musculoskeletal:  Negative for back pain.  Skin:  Negative for rash.  Neurological:  Negative for headaches.   Physical Exam Updated Vital Signs BP 135/77 (BP Location: Right Arm)    Pulse 87    Temp 98.2 F (36.8 C) (Oral)    Resp 16    Ht 5\' 7"  (1.702 m)    Wt 122.5 kg    SpO2 100%    BMI 42.29 kg/m  Physical Exam Constitutional:      General: She is not in acute distress.    Appearance: Normal appearance.  HENT:     Head: Normocephalic.     Nose: Nose normal.  Eyes:     Extraocular Movements: Extraocular movements intact.  Cardiovascular:     Rate and Rhythm: Normal rate.  Pulmonary:     Effort: Pulmonary effort is normal.  Musculoskeletal:        General: Normal range of motion.     Cervical back: Normal range of motion.     Comments: Normal range of motion of the right wrist.  Normal range  of motion of all fingers of the right upper extremity.  No snuffbox tenderness.  Tenderness mostly at the fleshy part near the base of the thumb on the palmar aspect.  No dorsal tenderness or snuffbox tenderness noted.  Neurovascular intact otherwise.  Neurological:     General: No focal deficit present.     Mental Status: She is alert. Mental status is at baseline.    ED Results / Procedures / Treatments   Labs (all labs ordered are listed, but only abnormal results are displayed) Labs Reviewed - No data to display  EKG None  Radiology DG Wrist Complete Right  Result Date: 08/05/2021 CLINICAL DATA:  Post fall, now with pain involving the base of the thumb. EXAM: RIGHT WRIST - COMPLETE 3+ VIEW COMPARISON:  None. FINDINGS: No fracture or dislocation. Joint spaces are preserved. No  erosions. No evidence of chondrocalcinosis. Regional soft tissues appear normal. No radiopaque foreign body. IMPRESSION: No fracture with special attention paid to the base of the thumb. If the patient has pain referable to the anatomic snuff box, splinting and a follow-up radiograph in 10 to 14 days is recommended to evaluate for an occult scaphoid fracture. Electronically Signed   By: Simonne Come M.D.   On: 08/05/2021 08:25    Procedures Procedures    Medications Ordered in ED Medications  acetaminophen (TYLENOL) tablet 650 mg (650 mg Oral Given 08/05/21 1448)    ED Course/ Medical Decision Making/ A&P                           Medical Decision Making Amount and/or Complexity of Data Reviewed Radiology: ordered.  Risk OTC drugs.   X-rays unremarkable.  Patient placed in a wrist splint for comfort.  Advised outpatient follow-up with her doctor in 2 to 3 weeks.  Advised to be return for worsening symptoms fevers pain or any additional concerns.        Final Clinical Impression(s) / ED Diagnoses Final diagnoses:  Right wrist pain    Rx / DC Orders ED Discharge Orders     None         Cheryll Cockayne, MD 08/05/21 [phone]

## 2021-08-05 NOTE — ED Notes (Signed)
X-ray at bedside

## 2021-08-05 NOTE — Discharge Instructions (Signed)
Call your primary care doctor or specialist as discussed in the next 2-3 days.   Return immediately back to the ER if:  Your symptoms worsen within the next 12-24 hours. You develop new symptoms such as new fevers, persistent vomiting, new pain, shortness of breath, or new weakness or numbness, or if you have any other concerns.  

## 2021-08-05 NOTE — ED Triage Notes (Signed)
On Sunday tripped and fell forward onto right wrist.  C/O pain and swelling.

## 2021-08-25 ENCOUNTER — Other Ambulatory Visit: Payer: Medicaid Other

## 2021-08-25 ENCOUNTER — Other Ambulatory Visit: Payer: Self-pay

## 2021-08-25 ENCOUNTER — Ambulatory Visit (INDEPENDENT_AMBULATORY_CARE_PROVIDER_SITE_OTHER): Payer: Medicaid Other

## 2021-08-25 DIAGNOSIS — Z3202 Encounter for pregnancy test, result negative: Secondary | ICD-10-CM

## 2021-08-25 LAB — POCT URINE PREGNANCY: Preg Test, Ur: NEGATIVE

## 2021-08-25 NOTE — Progress Notes (Signed)
[name] [name] presents today for UPT. She has no unusual complaints. LMP: Recently removed Nexplanon    OBJECTIVE: Appears well, in no apparent distress.  OB History     Gravida  3   Para  1   Term  1   Preterm  0   AB  2   Living  1      SAB  1   IAB  1   Ectopic  0   Multiple  0   Live Births  1          Home UPT Result:NA In-Office UPT result: NEGATIVE I have reviewed the patient's medical, obstetrical, social, and family histories, and medications.   ASSESSMENT: Negative pregnancy test  PLAN Prenatal care to be completed at: NA

## 2021-08-26 LAB — HIV ANTIBODY (ROUTINE TESTING W REFLEX): HIV Screen 4th Generation wRfx: NONREACTIVE

## 2021-08-26 LAB — RPR: RPR Ser Ql: NONREACTIVE

## 2021-08-26 LAB — HEPATITIS C ANTIBODY: Hep C Virus Ab: NONREACTIVE

## 2021-08-31 ENCOUNTER — Other Ambulatory Visit: Payer: Self-pay

## 2021-08-31 ENCOUNTER — Other Ambulatory Visit (HOSPITAL_COMMUNITY): Payer: Self-pay

## 2021-08-31 ENCOUNTER — Ambulatory Visit (INDEPENDENT_AMBULATORY_CARE_PROVIDER_SITE_OTHER): Payer: Managed Care, Other (non HMO) | Admitting: Family

## 2021-08-31 ENCOUNTER — Encounter: Payer: Self-pay | Admitting: Family

## 2021-08-31 VITALS — BP 130/86 | HR 79 | Temp 98.4°F | Ht 67.0 in | Wt 273.0 lb

## 2021-08-31 DIAGNOSIS — E1165 Type 2 diabetes mellitus with hyperglycemia: Secondary | ICD-10-CM

## 2021-08-31 DIAGNOSIS — L0293 Carbuncle, unspecified: Secondary | ICD-10-CM

## 2021-08-31 DIAGNOSIS — L0232 Furuncle of buttock: Secondary | ICD-10-CM | POA: Diagnosis not present

## 2021-08-31 MED ORDER — FLUCONAZOLE 150 MG PO TABS: ORAL_TABLET | ORAL | 1 refills | Status: DC

## 2021-08-31 MED ORDER — SULFAMETHOXAZOLE-TRIMETHOPRIM 800-160 MG PO TABS: 1.0000 | ORAL_TABLET | Freq: Two times a day (BID) | ORAL | 0 refills | Status: DC

## 2021-08-31 MED ORDER — CEFADROXIL 500 MG PO CAPS: 500.0000 mg | ORAL_CAPSULE | Freq: Two times a day (BID) | ORAL | 0 refills | Status: DC

## 2021-08-31 NOTE — Patient Instructions (Signed)
Nice to see you.  We will treat you with antibiotics for 7 days.   Continue cleansing with soap and water.   Recommend follow up with PCP.   Follow up with ID as needed.   Have a great day!

## 2021-08-31 NOTE — Assessment & Plan Note (Addendum)
[name] [name] has what sounds to be a new boil/abscess of her right buttock that spontaneously drained a couple of days ago. Discussed continuing basic care including cleansing with soap and water. Will give 7 days of Cefadroxil and Bactrim. Discussed importance of controlling blood sugars and following up with PCP as last A1c was last year. Remains at high risk for recurrence. Fluconazole provided for candidal infection with antibiotics. Follow up if symptoms worsen or do not improve.

## 2021-08-31 NOTE — Progress Notes (Signed)
Patient was assessed and managed by nursing staff during this encounter. I have reviewed the chart and agree with the documentation and plan. I have also made any necessary editorial changes. ° °Jazsmin Couse, MD °08/31/2021 11:19 AM  ° °

## 2021-08-31 NOTE — Progress Notes (Signed)
Subjective:    Patient ID: Heide Scales, female    DOB: May 13, 1991, 31 y.o.   MRN: [id number]  Chief Complaint  Patient presents with   Abcess / Boil    HPI:  ADILENE AREOLA is a 31 y.o. female with recurrent boils/skin abscesses last seen through telehealth visit by Rexene Alberts, NP for cellulitis/skin abscess of her right lower shin treated with Bactrim and Keflex. Was unable to complete the Keflex which was changed to Augmentin. Here today for an acute office visit.  Ms. Kukla has a new boil located on her right buttocks that has been going on for several day. Has been using warm compresses and the abscess spontaneous rupture and has had drainage for the last couple of days. Continues to cleanse with soap and water and has concern for infection due to the location. No systemic symptoms of fevers, chills or sweats.    Allergies  Allergen Reactions   Lisinopril Other (See Comments), Shortness Of Breath and Anaphylaxis    Chest pains Chest pains and tightness tachycardia      Outpatient Medications Prior to Visit  Medication Sig Dispense Refill   insulin glargine (LANTUS) 100 UNIT/ML injection Inject 0.15 mLs (15 Units total) into the skin at bedtime. (Patient taking differently: Inject 50 Units into the skin at bedtime.) 10 mL 1   metFORMIN (GLUCOPHAGE) 500 MG tablet Take 1 tablet (500 mg total) by mouth 2 (two) times daily with a meal. 60 tablet 1   HYDROcodone-acetaminophen (NORCO/VICODIN) 5-325 MG tablet Take 1 tablet by mouth every 4 (four) hours as needed for moderate pain or severe pain (Cough). 20 tablet 0   metroNIDAZOLE (FLAGYL) 500 MG tablet Take 1 tablet (500 mg total) by mouth 2 (two) times daily. (Patient not taking: Reported on 07/01/2020) 14 tablet 0   polyethylene glycol (MIRALAX / GLYCOLAX) 17 g packet Take 17 g by mouth daily. (Patient not taking: No sig reported) 14 each 0   Semaglutide, 1 MG/DOSE, (OZEMPIC, 1 MG/DOSE,) 4 MG/3ML SOPN Inject 1.25 mg into  the skin once a week.     sertraline (ZOLOFT) 50 MG tablet Take 50 mg by mouth daily.     TOUJEO MAX SOLOSTAR 300 UNIT/ML Solostar Pen INJECT 0.11 MLS (33 UNITS) SUBCUTANEOUSLY AT BEDTIME CAN RAISE UP TO 60 UNITS DAY (Patient not taking: No sig reported)     No facility-administered medications prior to visit.     Past Medical History:  Diagnosis Date   Cellulitis of right leg 08/14/2019   Cushing syndrome (HCC)    Diabetes mellitus    Type II   Gonorrhea    Hirsutism    PCOS (polycystic ovarian syndrome)    Vaginal dryness 04/20/2019   Yeast vaginitis 04/20/2019     Past Surgical History:  Procedure Laterality Date   ABSCESS DRAINAGE     THERAPEUTIC ABORTION     TOOTH EXTRACTION         Review of Systems  Constitutional:  Negative for chills, diaphoresis, fatigue and fever.  Respiratory:  Negative for cough, chest tightness, shortness of breath and wheezing.   Cardiovascular:  Negative for chest pain.  Gastrointestinal:  Negative for abdominal pain, diarrhea, nausea and vomiting.  Skin:        Positive for skin abscess/boil.      Objective:    BP 130/86    Pulse 79    Temp 98.4 F (36.9 C) (Oral)    Ht 5\' 7"  (1.702 m)  Wt 273 lb (123.8 kg)    SpO2 99%    BMI 42.76 kg/m  Nursing note and vital signs reviewed.  Physical Exam Constitutional:      General: She is not in acute distress.    Appearance: She is well-developed.  Cardiovascular:     Rate and Rhythm: Normal rate and regular rhythm.     Heart sounds: Normal heart sounds.  Pulmonary:     Effort: Pulmonary effort is normal.     Breath sounds: Normal breath sounds.  Genitourinary:    Comments: Exam deferred as it was refused.  Skin:    General: Skin is warm and dry.  Neurological:     Mental Status: She is alert and oriented to person, place, and time.  Psychiatric:        Mood and Affect: Mood normal.     Depression screen [facility] 2/9 08/31/2021 08/03/2021 07/18/2018 07/13/2018 06/30/2018  Decreased  Interest 1 0 0 2 3  Down, Depressed, Hopeless 3 0 1 3 3   PHQ - 2 Score 4 0 1 5 6   Altered sleeping 0 0 - 3 3  Tired, decreased energy 3 0 - 3 3  Change in appetite 3 0 - 3 3  Feeling bad or failure about yourself  3 0 - 3 3  Trouble concentrating 0 0 - 3 3  Moving slowly or fidgety/restless 0 0 - 3 0  Suicidal thoughts 0 0 - 0 0  PHQ-9 Score 13 0 - 23 21  Difficult doing work/chores Somewhat difficult Not difficult at all - - -       Assessment & Plan:    Patient Active Problem List   Diagnosis Date Noted   Vaginal discharge 09/18/2019   Implanon in place 09/18/2019   Screening for STD (sexually transmitted disease) 04/20/2019   Recurrent boils 12/28/2018   Poorly controlled diabetes mellitus (HCC) 06/04/2013   Cushings syndrome (HCC) 01/08/2013   PCOS (polycystic ovarian syndrome) 01/08/2013     Problem List Items Addressed This Visit       Endocrine   Poorly controlled diabetes mellitus (HCC)    Ms. Kaelin likely has poorly controlled diabetes. Does not currently check blood sugars at home. No recent A1c. Encouraged to follow up with PCP for diabetes management.         Other   Recurrent boils    Ms. Espinola has what sounds to be a new boil/abscess of her right buttock that spontaneously drained a couple of days ago. Discussed continuing basic care including cleansing with soap and water. Will give 7 days of Cefadroxil and Bactrim. Discussed importance of controlling blood sugars and following up with PCP as last A1c was last year. Remains at high risk for recurrence. Fluconazole provided for candidal infection with antibiotics. Follow up if symptoms worsen or do not improve.       Relevant Medications   cefadroxil (DURICEF) 500 MG capsule   sulfamethoxazole-trimethoprim (BACTRIM DS) 800-160 MG tablet   fluconazole (DIFLUCAN) 150 MG tablet   Other Visit Diagnoses     Boil of buttock    -  Primary   Relevant Medications   cefadroxil (DURICEF) 500 MG capsule    sulfamethoxazole-trimethoprim (BACTRIM DS) 800-160 MG tablet   fluconazole (DIFLUCAN) 150 MG tablet        I am having Ladona Ridgel R. Bargo start on cefadroxil, sulfamethoxazole-trimethoprim, and fluconazole. I am also having her maintain her metFORMIN, insulin glargine, polyethylene glycol, Toujeo Max SoloStar, metroNIDAZOLE, Ozempic (1 MG/DOSE),  sertraline, and HYDROcodone-acetaminophen.   Meds ordered this encounter  Medications   cefadroxil (DURICEF) 500 MG capsule    Sig: Take 1 capsule (500 mg total) by mouth 2 (two) times daily.    Dispense:  14 capsule    Refill:  0    Order Specific Question:   Supervising Provider    Answer:   Drue Second, CYNTHIA [[id number]]   sulfamethoxazole-trimethoprim (BACTRIM DS) 800-160 MG tablet    Sig: Take 1 tablet by mouth 2 (two) times daily.    Dispense:  14 tablet    Refill:  0    Order Specific Question:   Supervising Provider    Answer:   Drue Second, CYNTHIA [[id number]]   fluconazole (DIFLUCAN) 150 MG tablet    Sig: Take 1 tablet by mouth once and repeat in 72 hours if needed.    Dispense:  2 tablet    Refill:  1    Order Specific Question:   Supervising Provider    Answer:   Judyann Munson [[id number]]     Follow-up: Follow up if symptoms worsen or do not improve.    Marcos Eke, MSN, FNP-C Nurse Practitioner [facility]ious Disease [facility] Main number: [phone]

## 2021-08-31 NOTE — Assessment & Plan Note (Signed)
[name] [name] likely has poorly controlled diabetes. Does not currently check blood sugars at home. No recent A1c. Encouraged to follow up with PCP for diabetes management.

## 2021-09-03 ENCOUNTER — Encounter (HOSPITAL_BASED_OUTPATIENT_CLINIC_OR_DEPARTMENT_OTHER): Payer: Self-pay | Admitting: Emergency Medicine

## 2021-09-03 ENCOUNTER — Other Ambulatory Visit: Payer: Self-pay

## 2021-09-03 ENCOUNTER — Emergency Department (HOSPITAL_BASED_OUTPATIENT_CLINIC_OR_DEPARTMENT_OTHER)
Admission: EM | Admit: 2021-09-03 | Discharge: 2021-09-03 | Disposition: A | Discharge status: Home / Self Care | Payer: Managed Care, Other (non HMO) | Attending: Emergency Medicine | Admitting: Emergency Medicine

## 2021-09-03 DIAGNOSIS — E1165 Type 2 diabetes mellitus with hyperglycemia: Secondary | ICD-10-CM | POA: Insufficient documentation

## 2021-09-03 DIAGNOSIS — R739 Hyperglycemia, unspecified: Secondary | ICD-10-CM

## 2021-09-03 LAB — URINALYSIS, ROUTINE W REFLEX MICROSCOPIC
Bilirubin Urine: NEGATIVE
Glucose, UA: >1000 mg/dL — AB
Hgb urine dipstick: NEGATIVE
Ketones, ur: NEGATIVE mg/dL
Leukocytes,Ua: NEGATIVE
Nitrite: NEGATIVE
Protein, ur: 30 mg/dL — AB
Specific Gravity, Urine: 1.028 (ref 1.005–1.030)
pH: 6 (ref 5.0–8.0)

## 2021-09-03 LAB — BASIC METABOLIC PANEL
Anion gap: 8 (ref 5–15)
BUN: 11 mg/dL (ref 6–20)
CO2: 26 mmol/L (ref 22–32)
Calcium: 9 mg/dL (ref 8.9–10.3)
Chloride: 102 mmol/L (ref 98–111)
Creatinine, Ser: 0.65 mg/dL (ref 0.44–1.00)
GFR, Estimated: >60 mL/min (ref >60)
Glucose, Bld: 325 mg/dL — ABNORMAL HIGH (ref 70–99)
Potassium: 4 mmol/L (ref 3.5–5.1)
Sodium: 136 mmol/L (ref 135–145)

## 2021-09-03 LAB — CBC WITH DIFFERENTIAL/PLATELET
Abs Immature Granulocytes: 0.02 10*3/uL (ref 0.00–0.07)
Basophils Absolute: 0 10*3/uL (ref 0.0–0.1)
Basophils Relative: 0 %
Eosinophils Absolute: 0.1 10*3/uL (ref 0.0–0.5)
Eosinophils Relative: 1 %
HCT: 38 % (ref 36.0–46.0)
Hemoglobin: 12.8 g/dL (ref 12.0–15.0)
Immature Granulocytes: 0 %
Lymphocytes Relative: 36 %
Lymphs Abs: 2.1 10*3/uL (ref 0.7–4.0)
MCH: 31.2 pg (ref 26.0–34.0)
MCHC: 33.7 g/dL (ref 30.0–36.0)
MCV: 92.7 fL (ref 80.0–100.0)
Monocytes Absolute: 0.3 10*3/uL (ref 0.1–1.0)
Monocytes Relative: 6 %
Neutro Abs: 3.3 10*3/uL (ref 1.7–7.7)
Neutrophils Relative %: 57 %
Platelets: 215 10*3/uL (ref 150–400)
RBC: 4.1 MIL/uL (ref 3.87–5.11)
RDW: 11.9 % (ref 11.5–15.5)
WBC: 5.8 10*3/uL (ref 4.0–10.5)
nRBC: 0 % (ref 0.0–0.2)

## 2021-09-03 LAB — CBG MONITORING, ED
Glucose-Capillary: 330 mg/dL — ABNORMAL HIGH (ref 70–99)
Glucose-Capillary: 247 mg/dL — ABNORMAL HIGH (ref 70–99)

## 2021-09-03 MED ORDER — INSULIN GLARGINE 100 UNIT/ML ~~LOC~~ SOLN: 50.0000 [IU] | Freq: Every day | SUBCUTANEOUS | 0 refills | Status: DC

## 2021-09-03 MED ORDER — METFORMIN HCL 500 MG PO TABS
500.0000 mg | ORAL_TABLET | Freq: Two times a day (BID) | ORAL | 1 refills | Status: DC
Start: 2021-09-03 — End: 2021-09-26

## 2021-09-03 MED ORDER — METFORMIN HCL 500 MG PO TABS
500.0000 mg | ORAL_TABLET | ORAL | Status: AC
  Administered 2021-09-03: 500 mg via ORAL
  Filled 2021-09-03: qty 1

## 2021-09-03 MED ORDER — INSULIN ASPART 100 UNIT/ML IJ SOLN
8.0000 [IU] | Freq: Once | INTRAMUSCULAR | Status: AC
  Administered 2021-09-03: 8 [IU] via SUBCUTANEOUS

## 2021-09-03 NOTE — ED Notes (Signed)
Dc instructions reviewed with patient. Patient voiced understanding. Dc with belongings.  °

## 2021-09-03 NOTE — ED Notes (Signed)
Cbg 247 ?

## 2021-09-03 NOTE — ED Triage Notes (Signed)
Pt arrives to ED with c/o hyperglycemia. Pt reports sugars at home in 400's. She reports she has not taken her Lantus or Metformin in a month.  ?

## 2021-09-03 NOTE — ED Provider Notes (Signed)
?[facility] EM[facility] DEPT ?Provider Note ? ? ?CSN: [id number] ?Arrival date & time: 09/03/21  0909 ? ?  ? ?History ? ?Chief Complaint  ?Patient presents with  ? Hyperglycemia  ? ? ?[name] [name] is a 31 y.o. female. ? ?31 year old female with history of diabetes presents with high blood sugars at home.  States that she has been out of her medications about a month.  Does not follow a strict diabetic diet.  States that she has not had any severe polyuria or polydipsia.  Denies any emesis at this time.  Recent change in insurance has occurred and she is scheduled to see a new provider next week ? ? ?  ? ?Home Medications ?Prior to Admission medications   ?Medication Sig Start Date End Date Taking? Authorizing Provider  ?cefadroxil (DURICEF) 500 MG capsule Take 1 capsule (500 mg total) by mouth 2 (two) times daily. 08/31/21   Veryl Speak, FNP  ?fluconazole (DIFLUCAN) 150 MG tablet Take 1 tablet by mouth once and repeat in 72 hours if needed. 08/31/21   Veryl Speak, FNP  ?HYDROcodone-acetaminophen (NORCO/VICODIN) 5-325 MG tablet Take 1 tablet by mouth every 4 (four) hours as needed for moderate pain or severe pain (Cough). 07/05/21   Arby Barrette, MD  ?insulin glargine (LANTUS) 100 UNIT/ML injection Inject 0.15 mLs (15 Units total) into the skin at bedtime. ?Patient taking differently: Inject 50 Units into the skin at bedtime. 01/16/18   Derwood Kaplan, MD  ?metFORMIN (GLUCOPHAGE) 500 MG tablet Take 1 tablet (500 mg total) by mouth 2 (two) times daily with a meal. 01/16/18   Derwood Kaplan, MD  ?metroNIDAZOLE (FLAGYL) 500 MG tablet Take 1 tablet (500 mg total) by mouth 2 (two) times daily. ?Patient not taking: Reported on 07/01/2020 09/20/19   Hermina Staggers, MD  ?polyethylene glycol (MIRALAX / GLYCOLAX) 17 g packet Take 17 g by mouth daily. ?Patient not taking: No sig reported 04/25/19   Fayrene Helper, PA-C  ?Semaglutide, 1 MG/DOSE, (OZEMPIC, 1 MG/DOSE,) 4 MG/3ML SOPN Inject 1.25 mg into the  skin once a week. 01/16/20   [provider]  ?sertraline (ZOLOFT) 50 MG tablet Take 50 mg by mouth daily.    [provider]  ?sulfamethoxazole-trimethoprim (BACTRIM DS) 800-160 MG tablet Take 1 tablet by mouth 2 (two) times daily. 08/31/21   Veryl Speak, FNP  ?TOUJEO MAX SOLOSTAR 300 UNIT/ML Solostar Pen INJECT 0.11 MLS (33 UNITS) SUBCUTANEOUSLY AT BEDTIME CAN RAISE UP TO 60 UNITS DAY ?Patient not taking: No sig reported 09/07/19   [provider]  ?   ? ?Allergies    ?Lisinopril   ? ?Review of Systems   ?Review of Systems  ?All other systems reviewed and are negative. ? ?Physical Exam ?Updated Vital Signs ?BP (!) 126/93 (BP Location: Right Arm)   Pulse 76   Temp 98.6 ?F (37 ?C)   Resp 16   SpO2 98%  ?Physical Exam ?Vitals and nursing note reviewed.  ?Constitutional:   ?   General: She is not in acute distress. ?   Appearance: Normal appearance. She is well-developed. She is not toxic-appearing.  ?HENT:  ?   Head: Normocephalic and atraumatic.  ?Eyes:  ?   General: Lids are normal.  ?   Conjunctiva/sclera: Conjunctivae normal.  ?   Pupils: Pupils are equal, round, and reactive to light.  ?Neck:  ?   Thyroid: No thyroid mass.  ?   Trachea: No tracheal deviation.  ?Cardiovascular:  ?  Rate and Rhythm: Normal rate and regular rhythm.  ?   Heart sounds: Normal heart sounds. No murmur heard. ?  No gallop.  ?Pulmonary:  ?   Effort: Pulmonary effort is normal. No respiratory distress.  ?   Breath sounds: Normal breath sounds. No stridor. No decreased breath sounds, wheezing, rhonchi or rales.  ?Abdominal:  ?   General: There is no distension.  ?   Palpations: Abdomen is soft.  ?   Tenderness: There is no abdominal tenderness. There is no rebound.  ?Musculoskeletal:     ?   General: No tenderness. Normal range of motion.  ?   Cervical back: Normal range of motion and neck supple.  ?Skin: ?   General: Skin is warm and dry.  ?   Findings: No abrasion or rash.  ?Neurological:  ?   Mental  Status: She is alert and oriented to person, place, and time. Mental status is at baseline.  ?   GCS: GCS eye subscore is 4. GCS verbal subscore is 5. GCS motor subscore is 6.  ?   Cranial Nerves: No cranial nerve deficit.  ?   Sensory: No sensory deficit.  ?   Motor: Motor function is intact.  ?Psychiatric:     ?   Attention and Perception: Attention normal.     ?   Speech: Speech normal.     ?   Behavior: Behavior normal.  ? ? ?ED Results / Procedures / Treatments   ?Labs ?(all labs ordered are listed, but only abnormal results are displayed) ?Labs Reviewed  ?CBG MONITORING, ED - Abnormal; Notable for the following components:  ?    Result Value  ? Glucose-Capillary 330 (*)   ? All other components within normal limits  ?CBC WITH DIFFERENTIAL/PLATELET  ?BASIC METABOLIC PANEL  ?URINALYSIS, ROUTINE W REFLEX MICROSCOPIC  ? ? ?EKG ?None ? ?Radiology ?No results found. ? ?Procedures ?Procedures  ? ? ?Medications Ordered in ED ?Medications  ?metFORMIN (GLUCOPHAGE) tablet 500 mg (has no administration in time range)  ?insulin aspart (novoLOG) injection 8 Units (has no administration in time range)  ? ? ?ED Course/ Medical Decision Making/ A&P ?  ?                        ?Medical Decision Making ?Amount and/or Complexity of Data Reviewed ?Labs: ordered. ? ?Risk ?Prescription drug management. ? ? ?Patient here with increased blood sugars.  Concern for possible DKA.  Labs show no increased anion gap here.  Was treated with insulin as well oral medication.  Blood sugar initially was elevated in the 300s.  Repeat blood sugar is 247.  She is nontoxic-appearing ?Plan will be made for patient to be started on her hyperglycemic medication and follow-up with her doctor ? ? ? ? ? ? ? ?Final Clinical Impression(s) / ED Diagnoses ?Final diagnoses:  ?None  ? ? ?Rx / DC Orders ?ED Discharge Orders   ? ? None  ? ?  ? ? ?  ?Lorre Nick, MD ?09/03/21 1200 ? ?

## 2021-09-25 ENCOUNTER — Emergency Department
Admission: EM | Admit: 2021-09-25 | Discharge: 2021-09-26 | Disposition: A | Discharge status: Home / Self Care | Payer: Managed Care, Other (non HMO) | Attending: Emergency Medicine | Admitting: Emergency Medicine

## 2021-09-25 ENCOUNTER — Other Ambulatory Visit: Payer: Self-pay

## 2021-09-25 DIAGNOSIS — R112 Nausea with vomiting, unspecified: Secondary | ICD-10-CM | POA: Diagnosis present

## 2021-09-25 DIAGNOSIS — R531 Weakness: Secondary | ICD-10-CM | POA: Insufficient documentation

## 2021-09-25 DIAGNOSIS — E119 Type 2 diabetes mellitus without complications: Secondary | ICD-10-CM | POA: Diagnosis not present

## 2021-09-25 DIAGNOSIS — D72829 Elevated white blood cell count, unspecified: Secondary | ICD-10-CM | POA: Diagnosis not present

## 2021-09-25 LAB — CBG MONITORING, ED: Glucose-Capillary: 226 mg/dL — ABNORMAL HIGH (ref 70–99)

## 2021-09-25 MED ORDER — LACTATED RINGERS IV BOLUS: 1000.0000 mL | Freq: Once | INTRAVENOUS | Status: DC

## 2021-09-25 MED ORDER — ONDANSETRON HCL 4 MG/2ML IJ SOLN
4.0000 mg | INTRAMUSCULAR | Status: DC
Start: 2021-09-25 — End: 2021-09-26

## 2021-09-25 NOTE — ED Triage Notes (Addendum)
Pt states is diabetic and has been vomiting today. Pt is not very forthcoming with answers to triage questions. Pt states "ask my mom". Pt denies diarrhea, headache. Pt is alert but slow to respond to commands.  ?

## 2021-09-25 NOTE — ED Notes (Signed)
Pt started vomiting about an hr ago, pt is diabetic type 2 takes insulin has has several episodes of emesis  ?

## 2021-09-25 NOTE — ED Provider Notes (Signed)
? ?[facility] ?Provider Note ? ? ? Event Date/Time  ? First MD Initiated Contact with Patient 09/25/21 2328   ?  (approximate) ? ? ?History  ? ?Vomiting ? ? ?HPI ? ?[name] [name] is a 31 y.o. 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  She presents tonight for evaluation of a cute onset and severe vomiting as well as a degree of generalized weakness.  She is providing minimal history, answering questions briefly and telling me to ask her mom for more details.  Her mother and her daughter are at bedside.  They state that she has had a generally normal day although she told her mother earlier that she was " sick", seeming to imply nausea and/or vomiting, but then she started vomiting extensively about an hour ago and was not able to stop.  She is not currently vomiting but reports persistent nausea. ? ?She declines any other symptoms including stating that she has no pain and no difficulty breathing.  She is also specifically denying abdominal pain.  No recent dysuria.  No recent fever. ?  ? ? ?Physical Exam  ? ?Triage Vital Signs: ?ED Triage Vitals  ?Enc Vitals Group  ?   BP 09/25/21 2306 127/68  ?   Pulse Rate 09/25/21 2306 82  ?   Resp 09/25/21 2306 18  ?   Temp 09/25/21 2306 97.8 ?F (36.6 ?C)  ?   Temp Source 09/25/21 2306 Oral  ?   SpO2 09/25/21 2306 96 %  ?   Weight 09/25/21 2307 130 kg (286 lb 9.6 oz)  ?   Height 09/25/21 2307 1.727 m (5\' 8" )  ?   Head Circumference --   ?   Peak Flow --   ?   Pain Score --   ?   Pain Loc --   ?   Pain Edu? --   ?   Excl. in GC? --   ? ? ?Most recent vital signs: ?Vitals:  ? 09/25/21 2306  ?BP: 127/68  ?Pulse: 82  ?Resp: 18  ?Temp: 97.8 ?F (36.6 ?C)  ?SpO2: 96%  ? ? ? ?General: Awake, no distress, but also providing minimal history. ?CV:  Good  peripheral perfusion.  ?Resp:  Normal effort.  Lungs are clear to auscultation.  No indication of Kussmal breathing. ?Abd:  No distention.  No tenderness to palpation. ? ? ?ED Results / Procedures / Treatments  ? ?Labs ?(all labs ordered are listed, but only abnormal results are displayed) ?Labs Reviewed  ?COMPREHENSIVE METABOLIC PANEL - Abnormal; Notable for the following components:  ?    Result Value  ? Glucose, Bld 240 (*)   ? Total Protein 8.5 (*)   ? All other components within normal limits  ?CBC - Abnormal; Notable for the following components:  ? WBC 11.7 (*)   ? All other components within normal limits  ?BLOOD GAS, VENOUS - Abnormal; Notable for the following components:  ? pCO2, Ven 64 (*)   ? Bicarbonate 30.1 (*)   ? All other components within normal limits  ?CBG MONITORING, ED - Abnormal; Notable for the following components:  ? Glucose-Capillary 226 (*)   ? All other components within normal limits  ?URINALYSIS, ROUTINE W REFLEX MICROSCOPIC  ?POC URINE PREG, ED  ? ? ? ? ?PROCEDURES: ? ?  Critical Care performed: No ? ?Procedures ? ? ?MEDICATIONS ORDERED IN ED: ?Medications  ?ondansetron (ZOFRAN-ODT) disintegrating tablet 4 mg (4 mg Oral Given 09/26/21 0112)  ? ? ? ?IMPRESSION / MDM / ASSESSMENT AND PLAN / ED COURSE  ?I reviewed the triage vital signs and the nursing notes. ?             ?               ? ?Differential diagnosis includes, but is not limited to, nonspecific acute gastritis, foodborne pathogen or viral GI illness, DKA, medication side effect (recently started Ozempic). ? ?Vital signs are stable and within normal limits including no tachycardia nor tachypnea.  No respiratory symptoms.  Patient is denying having any pain. ? ?I suspect the patient's symptoms may be the result of the Ozempic.  A fingerstick blood sugar was obtained in triage and it is 226, still possible for her to be in DKA but perhaps less likely.  I ordered peripheral IV, comprehensive metabolic panel, urinalysis, CBC, and  urine pregnancy test.  I ordered 1 L LR IV bolus and Zofran 4 mg IV. ? ?No indication for emergent imaging.  Plan is to check her metabolic panel and determine if she has a normal anion gap, will consider adding on beta hydroxybutyric acid as needed.  I will also check a VBG with the initial blood draw.  Patient and her mother are in agreement with plan. ? ? ?Clinical Course as of 09/26/21 0348  ?Sat Sep 26, 2021  ?0035 CBC(!) ?Reviewed CBC, results are generally reassuring with a very mild leukocytosis of 11.7, otherwise unremarkable [CF]  ?2119 The patient has been sleeping.  She woke up easily when I went to talk to her.  She has had no more nausea nor vomiting and says she feels better and spite of not being able to have an IV placed for fluids.  I suggested that she can stay or go home with a prescription for Zofran and she would like to go home and sleep there.  There is no indication she needs to stay in the hospital.  There is no indication for admission.  I gave my usual and customary return precautions. [CF]  ?  ?Clinical Course User Index ?[CF] Loleta Rose, MD  ? ? ? ?FINAL CLINICAL IMPRESSION(S) / ED DIAGNOSES  ? ?Final diagnoses:  ?Nausea and vomiting, unspecified vomiting type  ? ? ? ?Rx / DC Orders  ? ?ED Discharge Orders   ? ?      Ordered  ?  ondansetron (ZOFRAN-ODT) 4 MG disintegrating tablet       ? 09/26/21 0348  ? ?  ?  ? ?  ? ? ? ?Note:  This document was prepared using Dragon voice recognition software and may include unintentional dictation errors. ?  ?Loleta Rose, MD ?09/26/21 [phone] ? ?

## 2021-09-26 LAB — BLOOD GAS, VENOUS
Acid-Base Excess: 1.7 mmol/L (ref 0.0–2.0)
Bicarbonate: 30.1 mmol/L — ABNORMAL HIGH (ref 20.0–28.0)
O2 Saturation: 53.8 %
Patient temperature: 37
pCO2, Ven: 64 mmHg — ABNORMAL HIGH (ref 44–60)
pH, Ven: 7.28 (ref 7.25–7.43)
pO2, Ven: 35 mmHg (ref 32–45)

## 2021-09-26 LAB — COMPREHENSIVE METABOLIC PANEL
ALT: 23 U/L (ref 0–44)
AST: 23 U/L (ref 15–41)
Albumin: 4.1 g/dL (ref 3.5–5.0)
Alkaline Phosphatase: 72 U/L (ref 38–126)
Anion gap: 9 (ref 5–15)
BUN: 12 mg/dL (ref 6–20)
CO2: 25 mmol/L (ref 22–32)
Calcium: 9.2 mg/dL (ref 8.9–10.3)
Chloride: 106 mmol/L (ref 98–111)
Creatinine, Ser: 0.69 mg/dL (ref 0.44–1.00)
GFR, Estimated: >60 mL/min (ref >60)
Glucose, Bld: 240 mg/dL — ABNORMAL HIGH (ref 70–99)
Potassium: 3.7 mmol/L (ref 3.5–5.1)
Sodium: 140 mmol/L (ref 135–145)
Total Bilirubin: 0.9 mg/dL (ref 0.3–1.2)
Total Protein: 8.5 g/dL — ABNORMAL HIGH (ref 6.5–8.1)

## 2021-09-26 LAB — CBC
HCT: 39.5 % (ref 36.0–46.0)
Hemoglobin: 12.8 g/dL (ref 12.0–15.0)
MCH: 30.8 pg (ref 26.0–34.0)
MCHC: 32.4 g/dL (ref 30.0–36.0)
MCV: 95 fL (ref 80.0–100.0)
Platelets: 168 10*3/uL (ref 150–400)
RBC: 4.16 MIL/uL (ref 3.87–5.11)
RDW: 12.1 % (ref 11.5–15.5)
WBC: 11.7 10*3/uL — ABNORMAL HIGH (ref 4.0–10.5)
nRBC: 0 % (ref 0.0–0.2)

## 2021-09-26 MED ORDER — ONDANSETRON 4 MG PO TBDP: ORAL_TABLET | ORAL | 0 refills | Status: DC

## 2021-09-26 MED ORDER — ONDANSETRON 4 MG PO TBDP
4.0000 mg | ORAL_TABLET | Freq: Once | ORAL | Status: AC
  Administered 2021-09-26: 4 mg via ORAL
  Filled 2021-09-26: qty 1

## 2021-09-26 NOTE — ED Notes (Signed)
After 4-5 INT placement attempts by 3 staff members, charge nurse states to place order for IV Team Consult.  ?

## 2021-09-26 NOTE — ED Notes (Signed)
Pt given a warm blanket 

## 2021-09-26 NOTE — ED Notes (Signed)
IV Team at bedside attempting to place INT. ?

## 2021-09-26 NOTE — ED Notes (Signed)
This RN attempted multiple times for IV access. Unable to obtain IV access, pt repeatedly pulling away when stuck, able to obtain blood work at this time, sent to lab, RT notified of VBG in lab. Primary RN notified of need for IV team.  ?

## 2021-09-26 NOTE — ED Notes (Signed)
Patient refuses to keep monitoring equipment on and taken an active roll in her care. She will open her eyes, look at staff, acknowledge them and then go back to sleep. ?

## 2021-09-26 NOTE — ED Notes (Addendum)
Encouraged patient to wake up and drink some fluids. Patient opened her eyes momentarily and she refused. Will try again shortly. Also asked patient to get up and provide urine specimen but she refused to respond to this request. ?

## 2021-09-26 NOTE — ED Notes (Signed)
Patient continues to refuse to drink the fluids provided even after asking me what I had provided. Patient has had no nausea or vomiting since her arrival. This nurse has notified the provided that I have tried several times to get the patient to drink her fluids but she refuses. ?

## 2021-09-26 NOTE — ED Notes (Signed)
Patient provided with discharge instructions, info regarding script and follow-up. Patient got to her feet immediately and began looking for her phone, for which she found in her bra. Patient then ambulated to the waiting room with a steady gait and without assistance. ?

## 2021-09-26 NOTE — Discharge Instructions (Signed)
Your workup in the Emergency Department today was reassuring.  We did not find any specific abnormalities.  We recommend you drink plenty of fluids, take your regular medications and/or any new ones prescribed today, and follow up with the doctor(s) listed in these documents as recommended.  Return to the Emergency Department if you develop new or worsening symptoms that concern you.  

## 2021-10-14 ENCOUNTER — Encounter (HOSPITAL_BASED_OUTPATIENT_CLINIC_OR_DEPARTMENT_OTHER): Payer: Self-pay | Admitting: Urology

## 2021-10-14 ENCOUNTER — Other Ambulatory Visit: Payer: Self-pay

## 2021-10-14 ENCOUNTER — Emergency Department (HOSPITAL_BASED_OUTPATIENT_CLINIC_OR_DEPARTMENT_OTHER)
Admission: EM | Admit: 2021-10-14 | Discharge: 2021-10-14 | Disposition: A | Discharge status: Home / Self Care | Payer: Managed Care, Other (non HMO) | Attending: Emergency Medicine | Admitting: Emergency Medicine

## 2021-10-14 DIAGNOSIS — R112 Nausea with vomiting, unspecified: Secondary | ICD-10-CM | POA: Insufficient documentation

## 2021-10-14 DIAGNOSIS — Z7984 Long term (current) use of oral hypoglycemic drugs: Secondary | ICD-10-CM | POA: Insufficient documentation

## 2021-10-14 DIAGNOSIS — Z794 Long term (current) use of insulin: Secondary | ICD-10-CM | POA: Diagnosis not present

## 2021-10-14 DIAGNOSIS — R519 Headache, unspecified: Secondary | ICD-10-CM | POA: Diagnosis present

## 2021-10-14 DIAGNOSIS — E119 Type 2 diabetes mellitus without complications: Secondary | ICD-10-CM | POA: Diagnosis not present

## 2021-10-14 LAB — COMPREHENSIVE METABOLIC PANEL
ALT: 11 U/L (ref 0–44)
AST: 15 U/L (ref 15–41)
Albumin: 4.1 g/dL (ref 3.5–5.0)
Alkaline Phosphatase: 78 U/L (ref 38–126)
Anion gap: 11 (ref 5–15)
BUN: 12 mg/dL (ref 6–20)
CO2: 24 mmol/L (ref 22–32)
Calcium: 9.1 mg/dL (ref 8.9–10.3)
Chloride: 106 mmol/L (ref 98–111)
Creatinine, Ser: 0.63 mg/dL (ref 0.44–1.00)
GFR, Estimated: >60 mL/min (ref >60)
Glucose, Bld: 184 mg/dL — ABNORMAL HIGH (ref 70–99)
Potassium: 4.2 mmol/L (ref 3.5–5.1)
Sodium: 141 mmol/L (ref 135–145)
Total Bilirubin: 0.6 mg/dL (ref 0.3–1.2)
Total Protein: 7.7 g/dL (ref 6.5–8.1)

## 2021-10-14 LAB — HCG, SERUM, QUALITATIVE: Preg, Serum: NEGATIVE

## 2021-10-14 LAB — CBC
HCT: 35.8 % — ABNORMAL LOW (ref 36.0–46.0)
Hemoglobin: 12 g/dL (ref 12.0–15.0)
MCH: 31.3 pg (ref 26.0–34.0)
MCHC: 33.5 g/dL (ref 30.0–36.0)
MCV: 93.2 fL (ref 80.0–100.0)
Platelets: 237 10*3/uL (ref 150–400)
RBC: 3.84 MIL/uL — ABNORMAL LOW (ref 3.87–5.11)
RDW: 12.3 % (ref 11.5–15.5)
WBC: 7.1 10*3/uL (ref 4.0–10.5)
nRBC: 0 % (ref 0.0–0.2)

## 2021-10-14 MED ORDER — ONDANSETRON 4 MG PO TBDP: 4.0000 mg | ORAL_TABLET | Freq: Three times a day (TID) | ORAL | 0 refills | Status: DC | PRN

## 2021-10-14 MED ORDER — SODIUM CHLORIDE 0.9 % IV BOLUS
500.0000 mL | Freq: Once | INTRAVENOUS | Status: AC
Start: 2021-10-14 — End: 2021-10-14
  Administered 2021-10-14: 500 mL via INTRAVENOUS

## 2021-10-14 MED ORDER — ONDANSETRON 4 MG PO TBDP
4.0000 mg | ORAL_TABLET | Freq: Once | ORAL | Status: AC | PRN
  Administered 2021-10-14: 4 mg via ORAL
  Filled 2021-10-14: qty 1

## 2021-10-14 MED ORDER — ONDANSETRON HCL 4 MG/2ML IJ SOLN
4.0000 mg | Freq: Once | INTRAMUSCULAR | Status: AC
Start: 2021-10-14 — End: 2021-10-14
  Administered 2021-10-14: 4 mg via INTRAVENOUS
  Filled 2021-10-14: qty 2

## 2021-10-14 MED ORDER — ACETAMINOPHEN 325 MG PO TABS
650.0000 mg | ORAL_TABLET | Freq: Once | ORAL | Status: AC
  Administered 2021-10-14: 650 mg via ORAL
  Filled 2021-10-14: qty 2

## 2021-10-14 NOTE — ED Notes (Signed)
RN provided AVS using Teachback Method. Patient verbalizes understanding of Discharge Instructions. Opportunity for Questioning and Answers were provided by RN. Patient Discharged from ED ambulatory to Home via Self.  

## 2021-10-14 NOTE — ED Triage Notes (Signed)
Pt states woke up this am with a bad headache, took tylenol and not helping  ?States nausea/vomiting and chills  ? ? ?

## 2021-10-14 NOTE — Discharge Instructions (Addendum)
You may continue to use Tylenol and/or ibuprofen for pain management of headaches at home.  If your headaches become more frequent, suggest following up with primary care for further medical management and evaluation. ? ?A prescription for an antinausea medication by the name of Zofran has been sent to your pharmacy.  You may utilize this with headaches that cause nausea.  Take as prescribed. ? ?Return to the ED for new or worsening symptoms as discussed. ?

## 2021-10-14 NOTE — ED Provider Notes (Signed)
?[facility] EM[facility] DEPT ?Provider Note ? ? ?CSN: [id number] ?Arrival date & time: 10/14/21  1115 ? ?  ? ?History ? ?Chief Complaint  ?Patient presents with  ? Headache  ? ? ?[name] [name] is a 31 y.o. female with complaint of headache, nausea and vomiting.  Hx of PCOS, Cushings syndrome, diabetes mellitus.  No prior history of migraines.  States the headache woke her up out of bed this morning and has progressively worsened since then.  She has tried taking 4 tablets of 200 mg Tylenol with no help around 8:00 this morning.  Pain essentially on her forehead and radiates towards the right side.  No history of autoimmune disease.  Denies diplopia, double vision, but endorses photophobia.  Denies stiff neck, neck pain, fever, or recent illness.  Denies abdominal pain, constipation, diarrhea.  Denies dizziness, facial asymmetry, numbness or tingling of the extremities.  Vomiting described as bilious.  Denies urinary changes.  Was brought to the ED by family member. ? ?The history is provided by the patient and medical records.  ?Headache ?Associated symptoms: nausea   ? ?  ? ?Home Medications ?Prior to Admission medications   ?Medication Sig Start Date End Date Taking? Authorizing Provider  ?ondansetron (ZOFRAN-ODT) 4 MG disintegrating tablet Take 1 tablet (4 mg total) by mouth every 8 (eight) hours as needed for nausea or vomiting. 10/14/21  Yes Cecil Cobbs, PA-C  ?HYDROcodone-acetaminophen (NORCO/VICODIN) 5-325 MG tablet Take 1 tablet by mouth every 4 (four) hours as needed for moderate pain or severe pain (Cough). 07/05/21   Arby Barrette, MD  ?insulin glargine (LANTUS) 100 UNIT/ML injection Inject 0.5 mLs (50 Units total) into the skin at bedtime. 09/03/21   Lorre Nick, MD  ?losartan (COZAAR) 25 MG tablet Take 25 mg by mouth daily. 09/09/21   [provider]  ?metFORMIN (GLUCOPHAGE-XR) 500 MG 24 hr tablet Take 1,000 mg by mouth 2 (two) times daily. 09/09/21   [provider]   ?metroNIDAZOLE (FLAGYL) 500 MG tablet Take 1 tablet (500 mg total) by mouth 2 (two) times daily. ?Patient not taking: Reported on 07/01/2020 09/20/19   Hermina Staggers, MD  ?OZEMPIC, 0.25 OR 0.5 MG/DOSE, 2 MG/1.5ML SOPN Inject 0.25 mg into the skin once a week. 09/09/21   [provider]  ?polyethylene glycol (MIRALAX / GLYCOLAX) 17 g packet Take 17 g by mouth daily. ?Patient not taking: Reported on 08/10/2019 04/25/19   Fayrene Helper, PA-C  ?   ? ?Allergies    ?Lisinopril   ? ?Review of Systems   ?Review of Systems  ?Constitutional:  Positive for chills.  ?Gastrointestinal:  Positive for nausea.  ?Neurological:  Positive for headaches.  ? ?Physical Exam ?Updated Vital Signs ?BP 127/74   Pulse 62   Temp 98 ?F (36.7 ?C)   Resp 16   Ht 5\' 8"  (1.727 m)   Wt 130 kg   LMP 10/10/2021 (Approximate)   SpO2 100%   BMI 43.58 kg/m?  ?Physical Exam ?Vitals and nursing note reviewed.  ?Constitutional:   ?   General: She is not in acute distress. ?   Appearance: She is well-developed. She is not ill-appearing or diaphoretic.  ?HENT:  ?   Head: Normocephalic and atraumatic.  ?   Mouth/Throat:  ?   Pharynx: Oropharynx is clear.  ?Eyes:  ?   General: No visual field deficit or scleral icterus. ?   Extraocular Movements: Extraocular movements intact.  ?   Right eye: Normal extraocular motion and no  nystagmus.  ?   Left eye: Normal extraocular motion and no nystagmus.  ?   Conjunctiva/sclera: Conjunctivae normal.  ?   Pupils: Pupils are equal, round, and reactive to light.  ?Cardiovascular:  ?   Rate and Rhythm: Normal rate and regular rhythm.  ?   Pulses:     ?     Radial pulses are 2+ on the right side and 2+ on the left side.  ?     Dorsalis pedis pulses are 2+ on the right side and 2+ on the left side.  ?   Heart sounds: Normal heart sounds. No murmur heard. ?Pulmonary:  ?   Effort: Pulmonary effort is normal. No respiratory distress.  ?   Breath sounds: Normal breath sounds. No wheezing.  ?Abdominal:  ?   General:  Bowel sounds are normal.  ?   Palpations: Abdomen is soft.  ?   Tenderness: There is no abdominal tenderness.  ?Musculoskeletal:     ?   General: No swelling.  ?   Cervical back: Normal range of motion and neck supple. No rigidity.  ?   Right lower leg: No edema.  ?   Left lower leg: No edema.  ?Skin: ?   General: Skin is warm and dry.  ?   Capillary Refill: Capillary refill takes less than 2 seconds.  ?Neurological:  ?   Mental Status: She is alert and oriented to person, place, and time.  ?   GCS: GCS eye subscore is 4. GCS verbal subscore is 5. GCS motor subscore is 6.  ?   Cranial Nerves: No dysarthria or facial asymmetry.  ?   Sensory: No sensory deficit.  ?   Motor: No weakness.  ?   Coordination: Coordination normal.  ?   Comments: No facial asymmetry or pronator drift, extraocular movements intact, no slurred speech  ?Psychiatric:     ?   Mood and Affect: Mood normal.  ? ? ?ED Results / Procedures / Treatments   ?Labs ?(all labs ordered are listed, but only abnormal results are displayed) ?Labs Reviewed  ?COMPREHENSIVE METABOLIC PANEL - Abnormal; Notable for the following components:  ?    Result Value  ? Glucose, Bld 184 (*)   ? All other components within normal limits  ?CBC - Abnormal; Notable for the following components:  ? RBC 3.84 (*)   ? HCT 35.8 (*)   ? All other components within normal limits  ?HCG, SERUM, QUALITATIVE  ? ? ?EKG ?None ? ?Radiology ?No results found. ? ?Procedures ?Procedures  ? ? ?Medications Ordered in ED ?Medications  ?ondansetron (ZOFRAN-ODT) disintegrating tablet 4 mg (4 mg Oral Given 10/14/21 1127)  ?acetaminophen (TYLENOL) tablet 650 mg (650 mg Oral Given 10/14/21 1259)  ?sodium chloride 0.9 % bolus 500 mL (0 mLs Intravenous Stopped 10/14/21 1438)  ?ondansetron (ZOFRAN) injection 4 mg (4 mg Intravenous Given 10/14/21 1300)  ? ? ?ED Course/ Medical Decision Making/ A&P ?  ?                        ?Medical Decision Making ?Amount and/or Complexity of Data Reviewed ?External Data  Reviewed: notes. ?Labs: ordered. Decision-making details documented in ED Course. ?Radiology: ordered and independent interpretation performed. Decision-making details documented in ED Course. ?ECG/medicine tests: ordered and independent interpretation performed. Decision-making details documented in ED Course. ? ?Risk ?OTC drugs. ?Prescription drug management. ? ? ?31 y.o. female presents to the ED for concern of Headache ? Marland Kitchen  This involves an extensive number of treatment options, and is a complaint that carries with it a high risk of complications and morbidity.  The differential diagnosis includes bad headache, migraine, cluster headache, tension headache, electrolyte imbalance, hyperglycemia ? ?Comorbidities that complicate the patient evaluation include PCOS, Cushing syndrome, diabetes mellitus, tobacco use ? ?Additional history obtained from internal/external records available via epic ? ?Interpretation: ?I ordered, and personally interpreted labs.  The pertinent results include:   ?BhCG: negative ?CMP: glucose 184, incidental finding ?CBC: unremarkable ? ?I considered ordering imaging of the head due to the lack of significant headaches in the pt's history.  Pt requested to attempt tylenol before further testing and treatments.  Her neurological exam is grossly unremarkable, without significant risk factors, labs are unremarkable, young age, and appears clinically stable, therefore I believe imaging may be deferred at this time. ? ?Intervention: ?I ordered medication including zofran for antiemesis and Tylenol for pain management per pt request.  Reevaluation of the patient after these medicines and rest showed that the patient had complete resolution of symptoms.  I have reviewed the patients home medicines and have made adjustments as needed ? ?ED Course: ?Pt presented with head pain and photophobia.  No recent trauma.  No hx of coagulation disorders.  Without significant hx of migraines or headaches.   Neuro exam unremarkable as described above.  Without vision loss/changes, or aural prodrome.  No pmhx of seizures.  Not suspicious of CVA.  Presentation not suggestive of cluster headache or giant cell arteritis.  Dennie BiblePat

## 2022-01-31 ENCOUNTER — Emergency Department (HOSPITAL_BASED_OUTPATIENT_CLINIC_OR_DEPARTMENT_OTHER)
Admission: EM | Admit: 2022-01-31 | Discharge: 2022-01-31 | Discharge status: Against Medical Advice | Payer: Managed Care, Other (non HMO) | Attending: Emergency Medicine | Admitting: Emergency Medicine

## 2022-01-31 ENCOUNTER — Other Ambulatory Visit: Payer: Self-pay

## 2022-01-31 DIAGNOSIS — Z5321 Procedure and treatment not carried out due to patient leaving prior to being seen by health care provider: Secondary | ICD-10-CM | POA: Insufficient documentation

## 2022-01-31 DIAGNOSIS — K5641 Fecal impaction: Secondary | ICD-10-CM | POA: Insufficient documentation

## 2022-01-31 NOTE — ED Triage Notes (Signed)
Patient arrives with complaints of constipation and fecal impaction. Last BM was 4 days ago. She tried OTC remedies with no relief.   Rates abdominal discomfort 8/10.

## 2022-01-31 NOTE — ED Notes (Signed)
Patient called 3x in the lobby. No answer

## 2022-03-26 ENCOUNTER — Emergency Department (HOSPITAL_BASED_OUTPATIENT_CLINIC_OR_DEPARTMENT_OTHER)
Admission: EM | Admit: 2022-03-26 | Discharge: 2022-03-26 | Disposition: A | Discharge status: Home / Self Care | Payer: Medicaid Other | Attending: Emergency Medicine | Admitting: Emergency Medicine

## 2022-03-26 ENCOUNTER — Other Ambulatory Visit: Payer: Self-pay

## 2022-03-26 ENCOUNTER — Encounter (HOSPITAL_BASED_OUTPATIENT_CLINIC_OR_DEPARTMENT_OTHER): Payer: Self-pay | Admitting: Emergency Medicine

## 2022-03-26 DIAGNOSIS — Z794 Long term (current) use of insulin: Secondary | ICD-10-CM | POA: Insufficient documentation

## 2022-03-26 DIAGNOSIS — N898 Other specified noninflammatory disorders of vagina: Secondary | ICD-10-CM | POA: Insufficient documentation

## 2022-03-26 LAB — WET PREP, GENITAL
Sperm: NONE SEEN
Trich, Wet Prep: NONE SEEN
WBC, Wet Prep HPF POC: <10 (ref <10)
Yeast Wet Prep HPF POC: NONE SEEN

## 2022-03-26 LAB — PREGNANCY, URINE: Preg Test, Ur: NEGATIVE

## 2022-03-26 LAB — HIV ANTIBODY (ROUTINE TESTING W REFLEX): HIV Screen 4th Generation wRfx: NONREACTIVE

## 2022-03-26 MED ORDER — METRONIDAZOLE 500 MG PO TABS: 500.0000 mg | ORAL_TABLET | Freq: Two times a day (BID) | ORAL | 0 refills | Status: DC

## 2022-03-26 NOTE — ED Provider Notes (Signed)
[facility]PT Provider Note   CSN: [id number] Arrival date & time: 03/26/22  0932     History  Chief Complaint  Patient presents with   Vaginal Discharge    [name] [name] is a 31 y.o. female.   Vaginal Discharge    Patient presents ED for evaluation of vaginal discharge.  Patient states she had a new partner about a week to week and a half prior.  3 to 4 days ago she started developing a thick white vaginal discharge.  She is not having any pain.  She is not having any dysuria.  She is not having any fevers.  Patient does not have a primary doctor or insurance so she came to the ED for evaluation  Home Medications Prior to Admission medications   Medication Sig Start Date End Date Taking? Authorizing Provider  metroNIDAZOLE (FLAGYL) 500 MG tablet Take 1 tablet (500 mg total) by mouth 2 (two) times daily. 03/26/22  Yes Linwood Dibbles, MD  HYDROcodone-acetaminophen (NORCO/VICODIN) 5-325 MG tablet Take 1 tablet by mouth every 4 (four) hours as needed for moderate pain or severe pain (Cough). 07/05/21   Arby Barrette, MD  insulin glargine (LANTUS) 100 UNIT/ML injection Inject 0.5 mLs (50 Units total) into the skin at bedtime. 09/03/21   Lorre Nick, MD  losartan (COZAAR) 25 MG tablet Take 25 mg by mouth daily. 09/09/21   [provider]  metFORMIN (GLUCOPHAGE-XR) 500 MG 24 hr tablet Take 1,000 mg by mouth 2 (two) times daily. 09/09/21   [provider]  ondansetron (ZOFRAN-ODT) 4 MG disintegrating tablet Take 1 tablet (4 mg total) by mouth every 8 (eight) hours as needed for nausea or vomiting. 10/14/21   Cecil Cobbs, PA-C  OZEMPIC, 0.25 OR 0.5 MG/DOSE, 2 MG/1.5ML SOPN Inject 0.25 mg into the skin once a week. 09/09/21   [provider]  polyethylene glycol (MIRALAX / GLYCOLAX) 17 g packet Take 17 g by mouth daily. Patient not taking: Reported on 08/10/2019 04/25/19   Fayrene Helper, PA-C      Allergies    Lisinopril    Review of  Systems   Review of Systems  Genitourinary:  Positive for vaginal discharge.    Physical Exam Updated Vital Signs BP 107/87 (BP Location: Right Arm)   Pulse 77   Temp 99.2 F (37.3 C) (Oral)   Resp 16   Ht 1.702 m (5\' 7" )   Wt 122.5 kg   SpO2 97%   BMI 42.30 kg/m  Physical Exam Vitals and nursing note reviewed.  Constitutional:      General: She is not in acute distress.    Appearance: She is well-developed.  HENT:     Head: Normocephalic and atraumatic.     Right Ear: External ear normal.     Left Ear: External ear normal.  Eyes:     General: No scleral icterus.       Right eye: No discharge.        Left eye: No discharge.     Conjunctiva/sclera: Conjunctivae normal.  Neck:     Trachea: No tracheal deviation.  Cardiovascular:     Rate and Rhythm: Normal rate.  Pulmonary:     Effort: Pulmonary effort is normal. No respiratory distress.     Breath sounds: No stridor.  Abdominal:     General: There is no distension.  Genitourinary:    Labia:        Right: No lesion.  Left: No lesion.      Vagina: Vaginal discharge present.     Cervix: No friability, lesion or erythema.     Uterus: Not enlarged and not tender.      Adnexa:        Right: No tenderness.         Left: No tenderness.       Comments: Thick white discharge noted on pelvic exam Musculoskeletal:        General: No swelling or deformity.     Cervical back: Neck supple.  Skin:    General: Skin is warm and dry.     Findings: No rash.  Neurological:     Mental Status: She is alert.     Cranial Nerves: Cranial nerve deficit: no gross deficits.     ED Results / Procedures / Treatments   Labs (all labs ordered are listed, but only abnormal results are displayed) Labs Reviewed  WET PREP, GENITAL - Abnormal; Notable for the following components:      Result Value   Clue Cells Wet Prep HPF POC PRESENT (*)    All other components within normal limits  PREGNANCY, URINE  RPR  HIV ANTIBODY  (ROUTINE TESTING W REFLEX)  GC/CHLAMYDIA PROBE AMP ([place]) NOT AT [facility]    EKG None  Radiology No results found.  Procedures Procedures    Medications Ordered in ED Medications - No data to display  ED Course/ Medical Decision Making/ A&P Clinical Course as of 03/26/22 1041  Fri Mar 26, 2022  1021 Pregnancy test is negative.  Wet prep does show clue cells [JK]    Clinical Course User Index [JK] Dorie Rank, MD                           Medical Decision Making Problems Addressed: Vaginal discharge: acute illness or injury  Amount and/or Complexity of Data Reviewed Labs: ordered.  Risk Prescription drug management.   Patient presented to the ED with complaints of vaginal discharge.  Recent intercourse with a new partner.  She not having any pelvic pain or discomfort.  I doubt PID.  No cells noted on the wet prep.  Will treat for possible bacterial vaginosis.  The exam suggests possible yeast infection as well although no yeast noted on the wet prep.  We will have her take a course of Flagyl but also suggested over-the-counter yeast medications if the symptoms are not improving.  Otherwise outpatient follow-up with a GYN doctor        Final Clinical Impression(s) / ED Diagnoses Final diagnoses:  Vaginal discharge    Rx / DC Orders ED Discharge Orders          Ordered    metroNIDAZOLE (FLAGYL) 500 MG tablet  2 times daily        03/26/22 1040              Dorie Rank, MD 03/26/22 1045

## 2022-03-26 NOTE — Discharge Instructions (Signed)
Take the antibiotic as prescribed.  Consider using an over-the-counter yeast infection medication if you have persistent discharge or itching.  Follow-up with an OB/GYN doctor for further evaluation if the symptoms persist.

## 2022-03-26 NOTE — ED Triage Notes (Signed)
Pt arrives to ED with c/o vaginal discharge. She reports white-thick discharge x3-4 days. Reports new partner. Denies pelvic/vaginal pain. Does have new partner this week.

## 2022-03-27 LAB — GC/CHLAMYDIA PROBE AMP (~~LOC~~) NOT AT ARMC
Chlamydia: NEGATIVE
Comment: NEGATIVE
Comment: NORMAL
Neisseria Gonorrhea: NEGATIVE

## 2022-03-27 LAB — RPR: RPR Ser Ql: NONREACTIVE

## 2022-04-07 ENCOUNTER — Ambulatory Visit: Payer: Medicaid Other | Admitting: Obstetrics and Gynecology

## 2022-05-12 ENCOUNTER — Encounter (HOSPITAL_BASED_OUTPATIENT_CLINIC_OR_DEPARTMENT_OTHER): Payer: Self-pay

## 2022-05-12 ENCOUNTER — Other Ambulatory Visit: Payer: Self-pay

## 2022-05-12 DIAGNOSIS — R110 Nausea: Secondary | ICD-10-CM | POA: Insufficient documentation

## 2022-05-12 DIAGNOSIS — R109 Unspecified abdominal pain: Secondary | ICD-10-CM | POA: Insufficient documentation

## 2022-05-12 DIAGNOSIS — Z5321 Procedure and treatment not carried out due to patient leaving prior to being seen by health care provider: Secondary | ICD-10-CM | POA: Insufficient documentation

## 2022-05-12 NOTE — ED Notes (Signed)
Bladder scan showed 

## 2022-05-12 NOTE — ED Triage Notes (Signed)
Pt presents to the ED with [facility]. Has not been able to urinate since this morning or have a BM in three days. Pt reports abdominal pain. Has tried stool softeners without relief. Pt reports some nausea.

## 2022-05-13 ENCOUNTER — Emergency Department (HOSPITAL_BASED_OUTPATIENT_CLINIC_OR_DEPARTMENT_OTHER)
Admission: EM | Admit: 2022-05-13 | Discharge: 2022-05-13 | Discharge status: Against Medical Advice | Payer: Medicaid Other | Attending: Emergency Medicine | Admitting: Emergency Medicine

## 2022-05-13 NOTE — ED Notes (Signed)
Call to take pt to room, no answerx2

## 2022-05-13 NOTE — ED Notes (Signed)
Pt called for room, no answer.

## 2022-08-17 ENCOUNTER — Ambulatory Visit (INDEPENDENT_AMBULATORY_CARE_PROVIDER_SITE_OTHER): Payer: Medicaid Other | Admitting: Obstetrics and Gynecology

## 2022-08-17 ENCOUNTER — Encounter: Payer: Self-pay | Admitting: Obstetrics and Gynecology

## 2022-08-17 ENCOUNTER — Other Ambulatory Visit (HOSPITAL_COMMUNITY)
Admission: RE | Admit: 2022-08-17 | Discharge: 2022-08-17 | Disposition: A | Discharge status: Home / Self Care | Payer: Medicaid Other | Source: Ambulatory Visit | Attending: Advanced Practice Midwife | Admitting: Advanced Practice Midwife

## 2022-08-17 VITALS — BP 123/80 | HR 67 | Ht 67.0 in | Wt 280.6 lb

## 2022-08-17 DIAGNOSIS — Z30017 Encounter for initial prescription of implantable subdermal contraceptive: Secondary | ICD-10-CM

## 2022-08-17 DIAGNOSIS — Z01419 Encounter for gynecological examination (general) (routine) without abnormal findings: Secondary | ICD-10-CM | POA: Insufficient documentation

## 2022-08-17 MED ORDER — ETONOGESTREL 68 MG ~~LOC~~ IMPL
68.0000 mg | DRUG_IMPLANT | Freq: Once | SUBCUTANEOUS | Status: AC
  Administered 2022-08-17: 68 mg via SUBCUTANEOUS

## 2022-08-17 NOTE — Addendum Note (Signed)
Addended by: Flonnie Hailstone on: 08/17/2022 02:13 PM   Modules accepted: Orders

## 2022-08-17 NOTE — Progress Notes (Signed)
Subjective:     [name] [name] is a 32 y.o. female P1 with LMP 07/16/22 and BMI 43 who is here for a comprehensive physical exam. The patient reports no problems. She is sexually active without contraception and desires Nexplanon today. She reports irregular cycles secondary to PCOS. She denies any pelvic pain or abnormal discharge.  Past Medical History:  Diagnosis Date   Cellulitis of right leg 08/14/2019   Cushing syndrome (HCC)    Diabetes mellitus    Type II   Gonorrhea    Hirsutism    PCOS (polycystic ovarian syndrome)    Vaginal dryness 04/20/2019   Yeast vaginitis 04/20/2019   Past Surgical History:  Procedure Laterality Date   ABSCESS DRAINAGE     THERAPEUTIC ABORTION     TOOTH EXTRACTION     Family History  Problem Relation Age of Onset   Epilepsy Father    Cancer Father        lung cancer   COPD Maternal Grandmother    Hypertension Maternal Grandmother    Heart disease Maternal Grandmother    Cancer Paternal Grandfather     Social History   Socioeconomic History   Marital status: Single    Spouse name: Not on file   Number of children: Not on file   Years of education: Not on file   Highest education level: Not on file  Occupational History   Not on file  Tobacco Use   Smoking status: Some Days    Packs/day: 0.20    Types: Cigarettes   Smokeless tobacco: Never   Tobacco comments:    States she goes through about 1 pack every 5 days, working on cutting back  Scientific laboratory technician Use: Never used  Substance and Sexual Activity   Alcohol use: Not Currently   Drug use: Yes    Types: Marijuana    Comment: daily marijuana use   Sexual activity: Yes    Partners: Female    Birth control/protection: None  Other Topics Concern   Not on file  Social History Narrative   Not on file   Social Determinants of Health   Financial Resource Strain: Not on file  Food Insecurity: Food Insecurity Present (07/13/2018)   Hunger Vital Sign    Worried About Running  Out of Food in the Last Year: Often true    Ran Out of Food in the Last Year: Often true  Transportation Needs: Unmet Transportation Needs (07/13/2018)   PRAPARE - Hydrologist (Medical): Yes    Lack of Transportation (Non-Medical): Yes  Physical Activity: Not on file  Stress: Not on file  Social Connections: Not on file  Intimate Partner Violence: Not on file   Health Maintenance  Topic Date Due   COVID-19 Vaccine (1) Never done   OPHTHALMOLOGY EXAM  Never done   Diabetic kidney evaluation - Urine ACR  07/15/2017   FOOT EXAM  07/15/2017   HEMOGLOBIN A1C  09/08/2017   INFLUENZA VACCINE  02/02/2022   Diabetic kidney evaluation - eGFR measurement  10/15/2022   DTaP/Tdap/Td (2 - Td or Tdap) 06/05/2023   PAP SMEAR-Modifier  08/03/2024   Hepatitis C Screening  Completed   HIV Screening  Completed   HPV VACCINES  Aged Out       Review of Systems Pertinent items noted in HPI and remainder of comprehensive ROS otherwise negative.   Objective:  Blood pressure 123/80, pulse 67, height 5' 7"$  (1.702 m), weight  280 lb 9.6 oz (127.3 kg), last menstrual period 07/16/2022, unknown if currently breastfeeding.   GENERAL: Well-developed, well-nourished female in no acute distress.  HEENT: Normocephalic, atraumatic. Sclerae anicteric.  NECK: Supple. Normal thyroid.  LUNGS: Clear to auscultation bilaterally.  HEART: Regular rate and rhythm. BREASTS: Symmetric in size. No palpable masses or lymphadenopathy, skin changes, or nipple drainage. ABDOMEN: Soft, nontender, nondistended. No organomegaly. PELVIC: Normal external female genitalia. Vagina is pink and rugated.  Normal discharge. Normal appearing cervix. Bimanual exam limited secondary to body habitus. No adnexal mass or tenderness. Chaperone present during the pelvic exam EXTREMITIES: No cyanosis, clubbing, or edema, 2+ distal pulses.     Assessment:    Healthy female exam.      Plan:    Pap smear was  normal 07/2021. Patient desires to defer STI screening per patient request Patient will be contacted with abnormal results Patient desires Nexplanon for contraception Patient given informed consent, signed copy in the chart, time out was performed. Pregnancy test was negative. Appropriate time out taken.  Patient's left arm was prepped and draped in the usual sterile fashion.. The ruler used to measure and mark insertion area.  Patient was prepped with alcohol swab and then injected with 4 cc of 1% lidocaine with epinephrine.  Patient was prepped with betadine, Nexplanon removed form packaging.  Device confirmed in needle, then inserted full length of needle and withdrawn per handbook instructions.  Patient insertion site covered with a bandaid and [place].   Minimal blood loss.  Patient tolerated the procedure well.   Patient referred to nutritionist to assist with weight loss management and diabetes optimization  See After Visit Summary for Counseling Recommendations

## 2022-08-17 NOTE — Progress Notes (Signed)
Pt presents for AEX and STD testing. Pt interested in [place]. Last unprotected sex last month.

## 2022-08-18 LAB — CERVICOVAGINAL ANCILLARY ONLY
Chlamydia: NEGATIVE
Comment: NEGATIVE
Comment: NORMAL
Neisseria Gonorrhea: NEGATIVE

## 2022-09-17 ENCOUNTER — Encounter: Payer: Self-pay | Admitting: Infectious Diseases

## 2022-09-17 ENCOUNTER — Other Ambulatory Visit: Payer: Self-pay

## 2022-09-17 ENCOUNTER — Ambulatory Visit (INDEPENDENT_AMBULATORY_CARE_PROVIDER_SITE_OTHER): Payer: Self-pay | Admitting: Infectious Diseases

## 2022-09-17 VITALS — BP 127/84 | HR 79 | Temp 98.9°F | Ht 67.0 in | Wt 281.0 lb

## 2022-09-17 DIAGNOSIS — Z7985 Long-term (current) use of injectable non-insulin antidiabetic drugs: Secondary | ICD-10-CM

## 2022-09-17 DIAGNOSIS — E1165 Type 2 diabetes mellitus with hyperglycemia: Secondary | ICD-10-CM

## 2022-09-17 DIAGNOSIS — Z7984 Long term (current) use of oral hypoglycemic drugs: Secondary | ICD-10-CM

## 2022-09-17 DIAGNOSIS — L0292 Furuncle, unspecified: Secondary | ICD-10-CM

## 2022-09-17 MED ORDER — SULFAMETHOXAZOLE-TRIMETHOPRIM 800-160 MG PO TABS: 1.0000 | ORAL_TABLET | Freq: Two times a day (BID) | ORAL | 0 refills | Status: AC

## 2022-09-17 MED ORDER — FLUCONAZOLE 150 MG PO TABS: 150.0000 mg | ORAL_TABLET | ORAL | 0 refills | Status: DC

## 2022-09-17 MED ORDER — MUPIROCIN 2 % EX OINT: 1.0000 | TOPICAL_OINTMENT | Freq: Three times a day (TID) | CUTANEOUS | 2 refills | Status: DC

## 2022-09-17 NOTE — Assessment & Plan Note (Addendum)
Currently uninsured and unable to schedule a visit with PCP d/t cost.  New large boil in right inframammary fold. Quite tense. She probably needs this drainage and we don't have the ability to do that here. Will try bactrim 1 DS tab BID x 10 days to see if that helps get it down with warm compresses. Precautions provided that if no better in 2-3 days or continues to worsen she should go to Urgent Care for drainage of this.

## 2022-09-17 NOTE — Patient Instructions (Addendum)
For primary care services, please call one of the following clinics for a new patient appointment:   Each clinic have different programs to support your health care needs when you don't have access to health insurance.   [place] floor of [facility]. [phone] They should have a program that helps people afford their diabetes care, including insulin.   Warm compresses to your boil multiple times a day to help it open up. If it is not any better in 3 days you probably need it drained so it can be treated. Urgent Care should be able to help you with that if needed.   If you have qualified for [facility] before, you may qualify for Medicaid since they have expanded their criteria in December.    [place] Medicaid Expansion Flyer      How To Apply for Medicaid?

## 2022-09-17 NOTE — Assessment & Plan Note (Signed)
In speaking with [name] [name] more about barriers to glycemic control she cannot afford any of her medications. She was given a sample vial of lantus from prescriber but it is due to run out soon. She does not have health insurance currently.  She has in the past had family planning medicaid - will get her information for application review to see if she qualifies for this now. If not, manufacturer assistance for Lantus was provided to her today (will need to be signed by managing prescriber).   I will also see if we can get her to the [facility] Internal Medicine team as I believe they have better resources availible for diabetes supplies.

## 2022-09-17 NOTE — Progress Notes (Signed)
Patient: [name] [name]  DOB: [birth year] MRN: [id number] PCP: Willeen Niece, PA     Subjective:  [name] [name] is a 32 y.o. female here for evaluation of new boil to her breast.   She has one bump that was there a few weeks back on the left inner breast that has since ruptured and has not completely healed. Right breast has been present and growing for 3 days now. It is very tender and has not opened up.   She frequently gets vaginal yeast infections when she needs oral antibiotics.     Review of Systems  All other systems reviewed and are negative.     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     metFORMIN (GLUCOPHAGE-XR) 500 MG 24 hr tablet Take 1,000 mg by mouth 2 (two) times daily.     OZEMPIC, 0.25 OR 0.5 MG/DOSE, 2 MG/1.5ML SOPN Inject 0.25 mg into the skin once a week.     HYDROcodone-acetaminophen (NORCO/VICODIN) 5-325 MG tablet Take 1 tablet by mouth every 4 (four) hours as needed for moderate pain or severe pain (Cough). 20 tablet 0   insulin glargine (LANTUS) 100 UNIT/ML injection Inject 0.5 mLs (50 Units total) into the skin at bedtime. (Patient not taking: Reported on 09/17/2022) 100 mL 0   metroNIDAZOLE (FLAGYL) 500 MG tablet Take 1 tablet (500 mg total) by mouth 2 (two) times daily. (Patient not taking: Reported on 09/17/2022) 14 tablet 0   ondansetron (ZOFRAN-ODT) 4 MG disintegrating tablet Take 1 tablet (4 mg total) by mouth every 8 (eight) hours as needed for nausea or vomiting. (Patient not taking: Reported on 09/17/2022) 20 tablet 0   polyethylene glycol (MIRALAX / GLYCOLAX) 17 g packet Take 17 g by mouth  daily. (Patient not taking: Reported on 08/10/2019) 14 each 0   No facility-administered medications prior to visit.     Allergies  Allergen Reactions   Lisinopril Other (See Comments), Shortness Of Breath and Anaphylaxis    Chest pains Chest pains and tightness tachycardia    Social History   Tobacco Use   Smoking status: Some Days    Packs/day: .2    Types: Cigarettes   Smokeless tobacco: Never   Tobacco comments:    States she goes through about 1 pack every 5 days, working on cutting back  Scientific laboratory technician Use: Never used  Substance Use Topics   Alcohol use: Not Currently   Drug use: Yes    Types: Marijuana    Comment: daily marijuana use    Family History  Problem Relation Age of Onset   Epilepsy Father    Cancer Father        lung cancer   COPD Maternal Grandmother    Hypertension Maternal Grandmother    Heart disease Maternal Grandmother    Cancer Paternal Grandfather     Objective:   Vitals:   09/17/22 1047  BP: 127/84  Pulse: 79  Temp: 98.9 F (37.2 C)  TempSrc: Temporal  Weight: 281 lb (127.5 kg)  Height: 5\' 7"  (1.702  m)   Body mass index is 44.01 kg/m.  Physical Exam Constitutional:      Appearance: Normal appearance.  Chest:       Comments: Right inframammary fold with large tender, indurated erythematous furuncle > 3 cm in diameter.  Left inframammary fold with opened non-healing area, white drainage.  Neurological:     Mental Status: She is alert.     Lab Results: Lab Results  Component Value Date   WBC 7.1 10/14/2021   HGB 12.0 10/14/2021   HCT 35.8 (L) 10/14/2021   MCV 93.2 10/14/2021   PLT 237 10/14/2021    Lab Results  Component Value Date   CREATININE 0.63 10/14/2021   BUN 12 10/14/2021   NA 141 10/14/2021   K 4.2 10/14/2021   CL 106 10/14/2021   CO2 24 10/14/2021    Lab Results  Component Value Date   ALT 11 10/14/2021   AST 15 10/14/2021   ALKPHOS 78 10/14/2021   BILITOT 0.6 10/14/2021     Assessment  & Plan:   Problem List Items Addressed This Visit       Unprioritized   Poorly controlled diabetes mellitus ([place]) - Primary    In speaking with Lovena Le more about barriers to glycemic control she cannot afford any of her medications. She was given a sample vial of lantus from prescriber but it is due to run out soon. She does not have health insurance currently.  She has in the past had family planning medicaid - will get her information for application review to see if she qualifies for this now. If not, manufacturer assistance for Lantus was provided to her today (will need to be signed by managing prescriber).   I will also see if we can get her to the [facility] team as I believe they have better resources availible for diabetes supplies.       Recurrent boils    Currently uninsured and unable to schedule a visit with PCP d/t cost.  New large boil in right inframammary fold. Quite tense. She probably needs this drainage and we don't have the ability to do that here. Will try bactrim 1 DS tab BID x 10 days to see if that helps get it down with warm compresses. Precautions provided that if no better in 2-3 days or continues to worsen she should go to Urgent Care for drainage of this.       Relevant Medications   sulfamethoxazole-trimethoprim (BACTRIM DS) 800-160 MG tablet   fluconazole (DIFLUCAN) 150 MG tablet   mupirocin ointment (BACTROBAN) 2 %    Janene Madeira, MSN, NP-C [facility] Pager: [phone] Office: [phone]  09/17/22  11:40 AM

## 2022-09-21 ENCOUNTER — Ambulatory Visit: Payer: Medicaid Other | Admitting: Obstetrics and Gynecology

## 2022-09-22 ENCOUNTER — Ambulatory Visit: Payer: Medicaid Other | Admitting: Obstetrics and Gynecology

## 2022-09-29 ENCOUNTER — Telehealth: Payer: Self-pay

## 2022-09-29 ENCOUNTER — Encounter: Payer: Self-pay | Admitting: Infectious Diseases

## 2022-09-29 ENCOUNTER — Other Ambulatory Visit: Payer: Self-pay

## 2022-09-29 ENCOUNTER — Ambulatory Visit: Payer: Medicaid Other | Admitting: Physician Assistant

## 2022-09-29 MED ORDER — AMOXICILLIN-POT CLAVULANATE 875-125 MG PO TABS: 1.0000 | ORAL_TABLET | Freq: Two times a day (BID) | ORAL | 0 refills | Status: DC

## 2022-09-29 MED ORDER — FLUCONAZOLE 150 MG PO TABS: 150.0000 mg | ORAL_TABLET | ORAL | 0 refills | Status: DC

## 2022-09-29 NOTE — Telephone Encounter (Signed)
Patient reports both abscess are now draining quite a bit and that is her main concern. She states she is not having much pain from the areas. Per Colletta Maryland she would like the patient to  try the Augmentin and  continue to use warm compresses to express the drainage. Keep it clean with soap and water and cover it with gauze to catch the drainage.  Patient advised of all instructions. Patient will call our office back if symptoms worsen and let us know how she is doing after completing the Augmentin. Patient also requested refill on fluconazole and I have sent a refill for her. Kaelin Holford T Brooks Sailors

## 2022-09-29 NOTE — Telephone Encounter (Signed)
Patient called complaining of the boils she had at her last visit are worsening and the medication prescribed is not working.Patient reports discharge from the boils.  Please advise if you would like me to have her come in and see Marya Amsler or send something different in for the patient. Steven Basso T Brooks Sailors

## 2022-09-29 NOTE — Telephone Encounter (Signed)
Ok she stated she will wait until you return and see you on 10/12/22

## 2022-09-29 NOTE — Telephone Encounter (Signed)
I have patient scheduled with [name] [name]. Patient could not come in this week. [name] [name] and [name] [name] not in office next weel

## 2022-10-12 ENCOUNTER — Ambulatory Visit: Payer: Self-pay | Admitting: Infectious Diseases

## 2022-11-17 ENCOUNTER — Other Ambulatory Visit: Payer: Self-pay

## 2022-11-17 DIAGNOSIS — E1165 Type 2 diabetes mellitus with hyperglycemia: Secondary | ICD-10-CM | POA: Insufficient documentation

## 2022-11-17 DIAGNOSIS — Z20822 Contact with and (suspected) exposure to covid-19: Secondary | ICD-10-CM | POA: Insufficient documentation

## 2022-11-17 DIAGNOSIS — M545 Low back pain, unspecified: Secondary | ICD-10-CM | POA: Insufficient documentation

## 2022-11-17 DIAGNOSIS — R519 Headache, unspecified: Secondary | ICD-10-CM | POA: Insufficient documentation

## 2022-11-17 NOTE — ED Triage Notes (Signed)
Patient ambulatory to triage with steady gait, without difficulty or distress noted; pt reports yesterday she was at work unloading a truck and pulled something in her back; declines desire to file workers comp at this time

## 2022-11-18 ENCOUNTER — Emergency Department
Admission: EM | Admit: 2022-11-18 | Discharge: 2022-11-18 | Disposition: A | Discharge status: Home / Self Care | Payer: Self-pay | Attending: Emergency Medicine | Admitting: Emergency Medicine

## 2022-11-18 ENCOUNTER — Emergency Department: Payer: Self-pay

## 2022-11-18 ENCOUNTER — Encounter: Payer: Self-pay | Admitting: Emergency Medicine

## 2022-11-18 DIAGNOSIS — M545 Low back pain, unspecified: Secondary | ICD-10-CM

## 2022-11-18 LAB — URINALYSIS, ROUTINE W REFLEX MICROSCOPIC
Bacteria, UA: NONE SEEN
Bilirubin Urine: NEGATIVE
Glucose, UA: >=500 mg/dL — AB
Ketones, ur: 20 mg/dL — AB
Nitrite: NEGATIVE
Protein, ur: >=300 mg/dL — AB
Specific Gravity, Urine: 1.045 — ABNORMAL HIGH (ref 1.005–1.030)
pH: 6 (ref 5.0–8.0)

## 2022-11-18 LAB — CBC WITH DIFFERENTIAL/PLATELET
Abs Immature Granulocytes: 0.01 10*3/uL (ref 0.00–0.07)
Basophils Absolute: 0 10*3/uL (ref 0.0–0.1)
Basophils Relative: 0 %
Eosinophils Absolute: 0 10*3/uL (ref 0.0–0.5)
Eosinophils Relative: 1 %
HCT: 37 % (ref 36.0–46.0)
Hemoglobin: 12.4 g/dL (ref 12.0–15.0)
Immature Granulocytes: 0 %
Lymphocytes Relative: 10 %
Lymphs Abs: 0.5 10*3/uL — ABNORMAL LOW (ref 0.7–4.0)
MCH: 31.1 pg (ref 26.0–34.0)
MCHC: 33.5 g/dL (ref 30.0–36.0)
MCV: 92.7 fL (ref 80.0–100.0)
Monocytes Absolute: 0.6 10*3/uL (ref 0.1–1.0)
Monocytes Relative: 13 %
Neutro Abs: 3.8 10*3/uL (ref 1.7–7.7)
Neutrophils Relative %: 76 %
Platelets: 175 10*3/uL (ref 150–400)
RBC: 3.99 MIL/uL (ref 3.87–5.11)
RDW: 11.8 % (ref 11.5–15.5)
WBC: 5 10*3/uL (ref 4.0–10.5)
nRBC: 0 % (ref 0.0–0.2)

## 2022-11-18 LAB — COMPREHENSIVE METABOLIC PANEL
ALT: 13 U/L (ref 0–44)
AST: 11 U/L — ABNORMAL LOW (ref 15–41)
Albumin: 3.4 g/dL — ABNORMAL LOW (ref 3.5–5.0)
Alkaline Phosphatase: 70 U/L (ref 38–126)
Anion gap: 8 (ref 5–15)
BUN: 13 mg/dL (ref 6–20)
CO2: 23 mmol/L (ref 22–32)
Calcium: 8.5 mg/dL — ABNORMAL LOW (ref 8.9–10.3)
Chloride: 101 mmol/L (ref 98–111)
Creatinine, Ser: 0.66 mg/dL (ref 0.44–1.00)
GFR, Estimated: >60 mL/min (ref >60)
Glucose, Bld: 316 mg/dL — ABNORMAL HIGH (ref 70–99)
Potassium: 3.7 mmol/L (ref 3.5–5.1)
Sodium: 132 mmol/L — ABNORMAL LOW (ref 135–145)
Total Bilirubin: 0.9 mg/dL (ref 0.3–1.2)
Total Protein: 7.3 g/dL (ref 6.5–8.1)

## 2022-11-18 LAB — LACTIC ACID, PLASMA
Lactic Acid, Venous: 1 mmol/L (ref 0.5–1.9)
Lactic Acid, Venous: 0.9 mmol/L (ref 0.5–1.9)

## 2022-11-18 LAB — POC URINE PREG, ED: Preg Test, Ur: NEGATIVE

## 2022-11-18 LAB — SARS CORONAVIRUS 2 BY RT PCR: SARS Coronavirus 2 by RT PCR: NEGATIVE

## 2022-11-18 LAB — PROCALCITONIN: Procalcitonin: <0.1 ng/mL

## 2022-11-18 LAB — HCG, QUANTITATIVE, PREGNANCY: hCG, Beta Chain, Quant, S: <1 m[IU]/mL (ref <5)

## 2022-11-18 MED ORDER — IOHEXOL 350 MG/ML SOLN
100.0000 mL | Freq: Once | INTRAVENOUS | Status: AC | PRN
  Administered 2022-11-18: 100 mL via INTRAVENOUS

## 2022-11-18 MED ORDER — HYDROCODONE-ACETAMINOPHEN 5-325 MG PO TABS: 1.0000 | ORAL_TABLET | ORAL | 0 refills | Status: DC | PRN

## 2022-11-18 MED ORDER — GADOBUTROL 1 MMOL/ML IV SOLN
9.0000 mL | Freq: Once | INTRAVENOUS | Status: AC | PRN
  Administered 2022-11-18: 9 mL via INTRAVENOUS

## 2022-11-18 MED ORDER — LIDOCAINE 5 % EX PTCH
1.0000 | MEDICATED_PATCH | CUTANEOUS | Status: DC
  Administered 2022-11-18: 1 via TRANSDERMAL
  Filled 2022-11-18: qty 1

## 2022-11-18 MED ORDER — ACETAMINOPHEN 500 MG PO TABS
1000.0000 mg | ORAL_TABLET | Freq: Once | ORAL | Status: AC
  Administered 2022-11-18: 1000 mg via ORAL
  Filled 2022-11-18: qty 2

## 2022-11-18 NOTE — ED Provider Notes (Signed)
[facility] Provider Note    Event Date/Time   First MD Initiated Contact with Patient 11/18/22 [phone]     (approximate)   History   Back Pain   HPI  [name] [name] is a 32 y.o. female with diabetes who comes in with concern for back pain.  Patient reports that she does have diabetes but she has not been taking her medications which appears to be glargine 50 units at bedtime as well as metformin thousand twice daily on review of records on 09/17/2022.  She reports having lower back pain.  She states that it is worse with certain movements.  She states that she does do some heavy lifting with work but she denies feeling an episode where she could have pulled something.  She just noticed that today her back was hurting more with movement.  She denies any known fevers, cough, shortness of breath or other symptoms.  She reports may be a mild headache but states that she does intermittently get these sometimes.  She denies any other infectious symptoms.     Physical Exam   Triage Vital Signs: ED Triage Vitals [11/17/22 2359]  Enc Vitals Group     BP (!) 143/102     Pulse Rate 94     Resp 17     Temp (!) 100.5 F (38.1 C)     Temp Source Oral     SpO2 96 %     Weight 273 lb (123.8 kg)     Height 5\' 7"  (1.702 m)     Head Circumference      Peak Flow      Pain Score 8     Pain Loc      Pain Edu?      Excl. in GC?     Most recent vital signs: Vitals:   11/18/22 0648 11/18/22 0716  BP: (!) 146/95 139/85  Pulse:  86  Resp: 17 18  Temp: 98.5 F (36.9 C) 98.9 F (37.2 C)  SpO2:  95%     General: Awake, no distress.  CV:  Good peripheral perfusion.  Resp:  Normal effort.  Abd:  No distention.  Soft nontender Other:  Equal strength in both legs.  Sensation intact.  No redness or rash noted to her back.  She has got pain on the whole lower back.  No CT spine tenderness.   ED Results / Procedures / Treatments   Labs (all labs ordered are  listed, but only abnormal results are displayed) Labs Reviewed  URINALYSIS, ROUTINE W REFLEX MICROSCOPIC - Abnormal; Notable for the following components:      Result Value   Color, Urine YELLOW (*)    APPearance CLEAR (*)    Specific Gravity, Urine 1.045 (*)    Glucose, UA >=500 (*)    Hgb urine dipstick SMALL (*)    Ketones, ur 20 (*)    Protein, ur >=300 (*)    Leukocytes,Ua TRACE (*)    All other components within normal limits  CBC WITH DIFFERENTIAL/PLATELET - Abnormal; Notable for the following components:   Lymphs Abs 0.5 (*)    All other components within normal limits  COMPREHENSIVE METABOLIC PANEL - Abnormal; Notable for the following components:   Sodium 132 (*)    Glucose, Bld 316 (*)    Calcium 8.5 (*)    Albumin 3.4 (*)    AST 11 (*)    All other components within normal limits  SARS CORONAVIRUS  2 BY RT PCR  PROCALCITONIN  LACTIC ACID, PLASMA  LACTIC ACID, PLASMA  HCG, QUANTITATIVE, PREGNANCY  POC URINE PREG, ED      RADIOLOGY I have reviewed the xray personally and interpreted and no evidence of any pneumonia PROCEDURES:  Critical Care performed: No  Procedures   MEDICATIONS ORDERED IN ED: Medications  lidocaine (LIDODERM) 5 % 1 patch (1 patch Transdermal Patch Applied 11/18/22 0313)  iohexol (OMNIPAQUE) 350 MG/ML injection 100 mL (100 mLs Intravenous Contrast Given 11/18/22 0507)  acetaminophen (TYLENOL) tablet 1,000 mg (1,000 mg Oral Given 11/18/22 0716)     IMPRESSION / MDM / ASSESSMENT AND PLAN / ED COURSE  I reviewed the triage vital signs and the nursing notes.   Patient's presentation is most consistent with acute presentation with potential threat to life or bodily function.   Patient comes in with concerns for low back pain but was noted to be febrile on examination.  CT imaging ordered to evaluate for any signs for acute abdominal pathology such as pyelonephritis given she reports bilateral flank tenderness as well as midline tenderness.   She denies any IV drug use or high risk for abscess.  Patient was given a lidocaine patch.  Patient did report some mild headache but states that her back pain is definitely what hurts the most.  Patient has full range of motion of her neck no confusion no neck stiffness.  We discussed the potential of meningitis but this seems very unlikely given negative procalcitonin and other reassuring exam signs.  We discussed lumbar puncture and patient would like to avoid this but we did discuss return precautions in regards to this.  At this time my suspicion for bacterial meningitis is very low.  Will proceed with CT imaging just to ensure no other acute pathology.  Pregnancy test was negative.  CBC was reassuring.  CMP shows elevated glucose but she has not been on her medications but no signs of DKA.  Procalcitonin was negative lactate normal hCG negative COVID-negative urine without evidence of UTI.  I repeat evaluation patient still having discomfort in her low back.  Given the original temperature we discussed MRI.  Initially patient declined but then later stated that she would want to do it.  I did place social work consult to see if you get help with assisting patient on medications out patient for her diabetes  Patient recheck of temperature without any fever reducers was afebrile which is reassuring.  Was then given Tylenol afterwards to help with her pain.  Patient is driving so did not want anything stronger.  Patient handed off to oncoming team   FINAL CLINICAL IMPRESSION(S) / ED DIAGNOSES   Final diagnoses:  Midline low back pain without sciatica, unspecified chronicity     Rx / DC Orders   ED Discharge Orders     None        Note:  This document was prepared using Dragon voice recognition software and may include unintentional dictation errors.   Concha Se, MD 11/18/22 [phone]

## 2022-11-18 NOTE — ED Notes (Signed)
Patient given gown to change per request of MRI. Patient attempting to take out nose rings.

## 2022-11-18 NOTE — ED Provider Notes (Signed)
-----------------------------------------   8:37 AM on 11/18/2022 ----------------------------------------- Patient care assumed from Dr. Fuller Plan.  Reassuring workup, reassuring lab work, MRI has now resulted normal as well.  Patient denies any other infectious symptoms.  Denies any cough congestion sore throat, urinary symptoms or abdominal pain.  Unclear where the patient's low-grade temperature originated from.  Discussed with the patient following up with her doctor.  Will prescribe a short course of pain medication for the patient.  Discussed return precautions.   Minna Antis, MD 11/18/22 [phone]

## 2022-11-18 NOTE — ED Notes (Signed)
Patient to MRI at this time.

## 2023-02-08 ENCOUNTER — Ambulatory Visit (INDEPENDENT_AMBULATORY_CARE_PROVIDER_SITE_OTHER): Payer: Self-pay

## 2023-02-08 ENCOUNTER — Other Ambulatory Visit (HOSPITAL_COMMUNITY)
Admission: RE | Admit: 2023-02-08 | Discharge: 2023-02-08 | Disposition: A | Discharge status: Home / Self Care | Payer: MEDICAID | Source: Ambulatory Visit | Attending: Obstetrics and Gynecology | Admitting: Obstetrics and Gynecology

## 2023-02-08 VITALS — Ht 67.0 in | Wt 287.0 lb

## 2023-02-08 DIAGNOSIS — Z113 Encounter for screening for infections with a predominantly sexual mode of transmission: Secondary | ICD-10-CM | POA: Insufficient documentation

## 2023-02-08 DIAGNOSIS — N898 Other specified noninflammatory disorders of vagina: Secondary | ICD-10-CM | POA: Insufficient documentation

## 2023-02-08 NOTE — Progress Notes (Signed)
SUBJECTIVE:  32 y.o. female complains of vaginal discharge for 3 days. Denies abnormal vaginal bleeding or significant pelvic pain or fever. No UTI symptoms. Denies history of known exposure to STD.  No LMP recorded. (Menstrual status: Oral contraceptives).  OBJECTIVE:  She appears well, afebrile. Urine dipstick: not done.  ASSESSMENT:  Vaginal Discharge  Screening for STI's   PLAN:  GC, chlamydia, trichomonas, BVAG, CVAG probe sent to lab. Treatment: To be determined once lab results are received ROV prn if symptoms persist or worsen.

## 2023-02-11 ENCOUNTER — Other Ambulatory Visit: Payer: Self-pay

## 2023-02-11 MED ORDER — FLUCONAZOLE 150 MG PO TABS: 150.0000 mg | ORAL_TABLET | Freq: Once | ORAL | 0 refills | Status: AC

## 2023-02-11 MED ORDER — METRONIDAZOLE 500 MG PO TABS: 500.0000 mg | ORAL_TABLET | Freq: Two times a day (BID) | ORAL | 0 refills | Status: DC

## 2023-02-11 NOTE — Progress Notes (Unsigned)
Pt called after seeing results of +BV and is requesting treatment for that and yeast. Sent per protocol.

## 2023-02-15 ENCOUNTER — Emergency Department
Admission: EM | Admit: 2023-02-15 | Discharge: 2023-02-15 | Disposition: A | Discharge status: Home / Self Care | Payer: Medicaid Other | Attending: Emergency Medicine | Admitting: Emergency Medicine

## 2023-02-15 ENCOUNTER — Other Ambulatory Visit: Payer: Self-pay

## 2023-02-15 DIAGNOSIS — E119 Type 2 diabetes mellitus without complications: Secondary | ICD-10-CM | POA: Insufficient documentation

## 2023-02-15 DIAGNOSIS — R519 Headache, unspecified: Secondary | ICD-10-CM | POA: Insufficient documentation

## 2023-02-15 DIAGNOSIS — Z20822 Contact with and (suspected) exposure to covid-19: Secondary | ICD-10-CM | POA: Insufficient documentation

## 2023-02-15 DIAGNOSIS — R112 Nausea with vomiting, unspecified: Secondary | ICD-10-CM | POA: Insufficient documentation

## 2023-02-15 LAB — COMPREHENSIVE METABOLIC PANEL
ALT: 14 U/L (ref 0–44)
AST: 11 U/L — ABNORMAL LOW (ref 15–41)
Albumin: 2.9 g/dL — ABNORMAL LOW (ref 3.5–5.0)
Alkaline Phosphatase: 72 U/L (ref 38–126)
Anion gap: 7 (ref 5–15)
BUN: 17 mg/dL (ref 6–20)
CO2: 24 mmol/L (ref 22–32)
Calcium: 8.6 mg/dL — ABNORMAL LOW (ref 8.9–10.3)
Chloride: 108 mmol/L (ref 98–111)
Creatinine, Ser: 0.62 mg/dL (ref 0.44–1.00)
GFR, Estimated: >60 mL/min (ref >60)
Glucose, Bld: 179 mg/dL — ABNORMAL HIGH (ref 70–99)
Potassium: 3.5 mmol/L (ref 3.5–5.1)
Sodium: 139 mmol/L (ref 135–145)
Total Bilirubin: 0.6 mg/dL (ref 0.3–1.2)
Total Protein: 6.9 g/dL (ref 6.5–8.1)

## 2023-02-15 LAB — CBC
HCT: 32.9 % — ABNORMAL LOW (ref 36.0–46.0)
Hemoglobin: 11.2 g/dL — ABNORMAL LOW (ref 12.0–15.0)
MCH: 31.3 pg (ref 26.0–34.0)
MCHC: 34 g/dL (ref 30.0–36.0)
MCV: 91.9 fL (ref 80.0–100.0)
Platelets: 214 10*3/uL (ref 150–400)
RBC: 3.58 MIL/uL — ABNORMAL LOW (ref 3.87–5.11)
RDW: 11.9 % (ref 11.5–15.5)
WBC: 8.9 10*3/uL (ref 4.0–10.5)
nRBC: 0 % (ref 0.0–0.2)

## 2023-02-15 LAB — RESP PANEL BY RT-PCR (RSV, FLU A&B, COVID)  RVPGX2
Influenza A by PCR: NEGATIVE
Influenza B by PCR: NEGATIVE
Resp Syncytial Virus by PCR: NEGATIVE
SARS Coronavirus 2 by RT PCR: NEGATIVE

## 2023-02-15 MED ORDER — BUTALBITAL-APAP-CAFFEINE 50-325-40 MG PO TABS
2.0000 | ORAL_TABLET | Freq: Once | ORAL | Status: AC
  Administered 2023-02-15: 2 via ORAL
  Filled 2023-02-15: qty 2

## 2023-02-15 MED ORDER — ONDANSETRON 4 MG PO TBDP
4.0000 mg | ORAL_TABLET | Freq: Once | ORAL | Status: AC
  Administered 2023-02-15: 4 mg via ORAL
  Filled 2023-02-15: qty 1

## 2023-02-15 NOTE — Discharge Instructions (Signed)
Please take Tylenol and ibuprofen/Advil for your pain.  It is safe to take them together, or to alternate them every few hours.  Take up to 1000mg of Tylenol at a time, up to 4 times per day.  Do not take more than 4000 mg of Tylenol in 24 hours.  For ibuprofen, take 400-600 mg, 3 - 4 times per day.  

## 2023-02-15 NOTE — ED Triage Notes (Signed)
Pt to ED via POV c/o headache. Pt reports it started last night. Pt later was vomiting and had chills. Denies any abd pain, CP, sob, dizziness, fevers.

## 2023-02-15 NOTE — ED Provider Notes (Signed)
[facility] Provider Note    Event Date/Time   First MD Initiated Contact with Patient 02/15/23 0229     (approximate)   History   Headache   HPI  QUINLYN DELAO is a 32 y.o. female who presents to the ED for evaluation of Headache   Morbid obese patient with history of DM presents to the ED with headache for the past 1-2 days.  Reports she feels nauseous with it and did have 1 episode of emesis, but no abdominal pain.  No syncope, vision changes or dizziness.  Reports it feels like a tight band around her head.  Took some ibuprofen 1 time with improvement, but the pain has not resolved.  No sick contacts, fevers, neck stiffness   Physical Exam   Triage Vital Signs: ED Triage Vitals  Encounter Vitals Group     BP 02/15/23 0215 (!) 145/107     Systolic BP Percentile --      Diastolic BP Percentile --      Pulse Rate 02/15/23 0215 87     Resp 02/15/23 0215 18     Temp 02/15/23 0215 98.4 F (36.9 C)     Temp Source 02/15/23 0215 Oral     SpO2 02/15/23 0215 98 %     Weight 02/15/23 0208 283 lb (128.4 kg)     Height 02/15/23 0208 5\' 7"  (1.702 m)     Head Circumference --      Peak Flow --      Pain Score 02/15/23 0208 9     Pain Loc --      Pain Education --      Exclude from Growth Chart --     Most recent vital signs: Vitals:   02/15/23 0215  BP: (!) 145/107  Pulse: 87  Resp: 18  Temp: 98.4 F (36.9 C)  SpO2: 98%    General: Awake, no distress.  CV:  Good peripheral perfusion.  Resp:  Normal effort.  Abd:  No distention.  Soft and benign throughout MSK:  No deformity noted.  Neuro:  No focal deficits appreciated. Other:     ED Results / Procedures / Treatments   Labs (all labs ordered are listed, but only abnormal results are displayed) Labs Reviewed  CBC - Abnormal; Notable for the following components:      Result Value   RBC 3.58 (*)    Hemoglobin 11.2 (*)    HCT 32.9 (*)    All other components within normal limits   COMPREHENSIVE METABOLIC PANEL - Abnormal; Notable for the following components:   Glucose, Bld 179 (*)    Calcium 8.6 (*)    Albumin 2.9 (*)    AST 11 (*)    All other components within normal limits  RESP PANEL BY RT-PCR (RSV, FLU A&B, COVID)  [id number]    EKG   RADIOLOGY   Official radiology report(s): No results found.  PROCEDURES and INTERVENTIONS:  Procedures  Medications  butalbital-acetaminophen-caffeine (FIORICET) 50-325-40 MG per tablet 2 tablet (2 tablets Oral Given 02/15/23 0248)  ondansetron (ZOFRAN-ODT) disintegrating tablet 4 mg (4 mg Oral Given 02/15/23 0249)     IMPRESSION / MDM / ASSESSMENT AND PLAN / ED COURSE  I reviewed the triage vital signs and the nursing notes.  Differential diagnosis includes, but is not limited to, poor glasses prescription, migraine, tension headache, ICH, meningitis or encephalitis, viral syndrome, biliary colic  {Patient presents with symptoms of an acute illness or injury that  is potentially life-threatening.  Patient presents with a headache, resolving with Fioricet and suitable for outpatient management.  She did have 1 episode of emesis last night, but no abdominal pain.  Benign abdominal exam and reassuring blood work with normal white count.  Minimal hyperglycemia without signs of acidosis or other metabolic derangements.  Negative viral swabbing.  Resolution of symptoms after Fioricet.  Suitable for outpatient management.  Clinical Course as of 02/15/23 0509  Tue Feb 15, 2023  0414 Reassessed. Feeling better. Asking for some time to rest [DS]  0502 Reassessed. Feeling well. Mom at the bedside. Ready to go [DS]    Clinical Course User Index [DS] Delton Prairie, MD     FINAL CLINICAL IMPRESSION(S) / ED DIAGNOSES   Final diagnoses:  Bad headache     Rx / DC Orders   ED Discharge Orders     None        Note:  This document was prepared using Dragon voice recognition software and may include unintentional  dictation errors.   Delton Prairie, MD 02/15/23 [phone]

## 2023-03-23 ENCOUNTER — Ambulatory Visit: Payer: Medicaid Other | Admitting: Critical Care Medicine

## 2023-04-01 ENCOUNTER — Other Ambulatory Visit: Payer: Self-pay

## 2023-04-01 ENCOUNTER — Emergency Department
Admission: EM | Admit: 2023-04-01 | Discharge: 2023-04-02 | Disposition: A | Discharge status: Home / Self Care | Payer: Self-pay | Attending: Emergency Medicine | Admitting: Emergency Medicine

## 2023-04-01 ENCOUNTER — Emergency Department: Payer: Medicaid Other

## 2023-04-01 DIAGNOSIS — K5900 Constipation, unspecified: Secondary | ICD-10-CM | POA: Insufficient documentation

## 2023-04-01 DIAGNOSIS — R7309 Other abnormal glucose: Secondary | ICD-10-CM | POA: Insufficient documentation

## 2023-04-01 LAB — CBG MONITORING, ED: Glucose-Capillary: 217 mg/dL — ABNORMAL HIGH (ref 70–99)

## 2023-04-01 NOTE — ED Triage Notes (Signed)
Pt to ed from home via POV for constipation that started today. Pt last BM was Sunday. Pt has taken mirilax, and prune juice with no relief at home. Pt states "I feel like I can feel it but it wont come out". Pt is caox4, in no acute distress and ambulatory in triage.

## 2023-04-01 NOTE — ED Notes (Addendum)
Pt to desk to ask [place] to check her BGL as she is type 2 diabetic. BGL 217

## 2023-04-02 ENCOUNTER — Emergency Department (HOSPITAL_BASED_OUTPATIENT_CLINIC_OR_DEPARTMENT_OTHER): Payer: Medicaid Other

## 2023-04-02 ENCOUNTER — Other Ambulatory Visit: Payer: Self-pay

## 2023-04-02 ENCOUNTER — Encounter (HOSPITAL_BASED_OUTPATIENT_CLINIC_OR_DEPARTMENT_OTHER): Payer: Self-pay | Admitting: Emergency Medicine

## 2023-04-02 ENCOUNTER — Emergency Department (HOSPITAL_BASED_OUTPATIENT_CLINIC_OR_DEPARTMENT_OTHER)
Admission: EM | Admit: 2023-04-02 | Discharge: 2023-04-02 | Disposition: A | Discharge status: Home / Self Care | Payer: Self-pay | Attending: Emergency Medicine | Admitting: Emergency Medicine

## 2023-04-02 DIAGNOSIS — K5289 Other specified noninfective gastroenteritis and colitis: Secondary | ICD-10-CM | POA: Insufficient documentation

## 2023-04-02 DIAGNOSIS — Z7984 Long term (current) use of oral hypoglycemic drugs: Secondary | ICD-10-CM | POA: Insufficient documentation

## 2023-04-02 DIAGNOSIS — K5641 Fecal impaction: Secondary | ICD-10-CM | POA: Insufficient documentation

## 2023-04-02 DIAGNOSIS — Z794 Long term (current) use of insulin: Secondary | ICD-10-CM | POA: Insufficient documentation

## 2023-04-02 DIAGNOSIS — E119 Type 2 diabetes mellitus without complications: Secondary | ICD-10-CM | POA: Insufficient documentation

## 2023-04-02 LAB — COMPREHENSIVE METABOLIC PANEL
ALT: 11 U/L (ref 0–44)
AST: 9 U/L — ABNORMAL LOW (ref 15–41)
Albumin: 3.6 g/dL (ref 3.5–5.0)
Alkaline Phosphatase: 73 U/L (ref 38–126)
Anion gap: 9 (ref 5–15)
BUN: 9 mg/dL (ref 6–20)
CO2: 27 mmol/L (ref 22–32)
Calcium: 8.9 mg/dL (ref 8.9–10.3)
Chloride: 104 mmol/L (ref 98–111)
Creatinine, Ser: 0.58 mg/dL (ref 0.44–1.00)
GFR, Estimated: >60 mL/min (ref >60)
Glucose, Bld: 174 mg/dL — ABNORMAL HIGH (ref 70–99)
Potassium: 3.5 mmol/L (ref 3.5–5.1)
Sodium: 140 mmol/L (ref 135–145)
Total Bilirubin: 0.9 mg/dL (ref 0.3–1.2)
Total Protein: 6.9 g/dL (ref 6.5–8.1)

## 2023-04-02 LAB — CBC WITH DIFFERENTIAL/PLATELET
Abs Immature Granulocytes: 0.03 10*3/uL (ref 0.00–0.07)
Basophils Absolute: 0 10*3/uL (ref 0.0–0.1)
Basophils Relative: 0 %
Eosinophils Absolute: 0.1 10*3/uL (ref 0.0–0.5)
Eosinophils Relative: 1 %
HCT: 35.4 % — ABNORMAL LOW (ref 36.0–46.0)
Hemoglobin: 12.1 g/dL (ref 12.0–15.0)
Immature Granulocytes: 0 %
Lymphocytes Relative: 23 %
Lymphs Abs: 1.8 10*3/uL (ref 0.7–4.0)
MCH: 31.1 pg (ref 26.0–34.0)
MCHC: 34.2 g/dL (ref 30.0–36.0)
MCV: 91 fL (ref 80.0–100.0)
Monocytes Absolute: 0.5 10*3/uL (ref 0.1–1.0)
Monocytes Relative: 7 %
Neutro Abs: 5.2 10*3/uL (ref 1.7–7.7)
Neutrophils Relative %: 69 %
Platelets: 247 10*3/uL (ref 150–400)
RBC: 3.89 MIL/uL (ref 3.87–5.11)
RDW: 12.3 % (ref 11.5–15.5)
WBC: 7.6 10*3/uL (ref 4.0–10.5)
nRBC: 0 % (ref 0.0–0.2)

## 2023-04-02 LAB — CBG MONITORING, ED: Glucose-Capillary: 162 mg/dL — ABNORMAL HIGH (ref 70–99)

## 2023-04-02 MED ORDER — DOCUSATE SODIUM 100 MG PO CAPS
100.0000 mg | ORAL_CAPSULE | Freq: Once | ORAL | Status: AC
  Administered 2023-04-02: 100 mg via ORAL
  Filled 2023-04-02: qty 1

## 2023-04-02 MED ORDER — LORAZEPAM 2 MG/ML IJ SOLN
1.0000 mg | Freq: Once | INTRAMUSCULAR | Status: AC
  Administered 2023-04-02: 1 mg via INTRAVENOUS
  Filled 2023-04-02: qty 1

## 2023-04-02 MED ORDER — ACETAMINOPHEN 500 MG PO TABS
1000.0000 mg | ORAL_TABLET | Freq: Once | ORAL | Status: AC
  Administered 2023-04-02: 1000 mg via ORAL
  Filled 2023-04-02: qty 2

## 2023-04-02 MED ORDER — AMOXICILLIN-POT CLAVULANATE 875-125 MG PO TABS: 1.0000 | ORAL_TABLET | Freq: Two times a day (BID) | ORAL | 0 refills | Status: DC

## 2023-04-02 MED ORDER — IOHEXOL 300 MG/ML  SOLN
100.0000 mL | Freq: Once | INTRAMUSCULAR | Status: AC | PRN
  Administered 2023-04-02: 100 mL via INTRAVENOUS

## 2023-04-02 MED ORDER — GLYCERIN (LAXATIVE) 2 G RE SUPP
1.0000 | RECTAL | Status: AC
  Administered 2023-04-02: 1 via RECTAL
  Filled 2023-04-02: qty 1

## 2023-04-02 MED ORDER — LACTULOSE 10 GM/15ML PO SOLN
20.0000 g | Freq: Once | ORAL | Status: AC
  Administered 2023-04-02: 20 g via ORAL
  Filled 2023-04-02: qty 30

## 2023-04-02 NOTE — ED Provider Notes (Signed)
[facility] Provider Note    Event Date/Time   First MD Initiated Contact with Patient 04/01/23 2347     (approximate)   History   Constipation (X 6 days)   HPI [name] [name] is a 32 y.o. female who presents for evaluation of constipation that started today.  She says that her last bowel movement was close to a week ago but it only started being a problem for her today.  She said that she tried taking some MiraLAX and then some prune juice and it did not seem to help.  She said that she feels like it just will not come out.  She has not had any surgeries in the past and does not regularly have issues with constipation although she said that as an adult she has this problem every 6 to 8 months.  She does not believe she is supposed to take MiraLAX for an extended period of time so she will take it until she gets better but then it happens again months later.  She denies nausea and vomiting.  No recent fever.     Physical Exam   Triage Vital Signs: ED Triage Vitals  Encounter Vitals Group     BP 04/01/23 2145 104/80     Systolic BP Percentile --      Diastolic BP Percentile --      Pulse Rate 04/01/23 2145 99     Resp 04/01/23 2145 16     Temp 04/01/23 2145 98 F (36.7 C)     Temp Source 04/01/23 2145 Oral     SpO2 04/01/23 2145 98 %     Weight 04/01/23 2146 129.3 kg (285 lb)     Height 04/01/23 2146 1.702 m (5\' 7" )     Head Circumference --      Peak Flow --      Pain Score 04/01/23 2146 10     Pain Loc --      Pain Education --      Exclude from Growth Chart --     Most recent vital signs: Vitals:   04/01/23 2145 04/02/23 0123  BP: 104/80   Pulse: 99   Resp: 16 16  Temp: 98 F (36.7 C)   SpO2: 98%     General: Awake, alert, ambulatory without difficulty, appears well in general but see hospital course for details. CV:  Good peripheral perfusion.  Borderline tachycardia, regular rhythm. Resp:  Normal effort. Speaking easily and  comfortably, no accessory muscle usage nor intercostal retractions.   Abd:  No distention.  Other:  Patient is quite anxious and upset.  See hospital course for details.   ED Results / Procedures / Treatments   Labs (all labs ordered are listed, but only abnormal results are displayed) Labs Reviewed  CBG MONITORING, ED - Abnormal; Notable for the following components:      Result Value   Glucose-Capillary 217 (*)    All other components within normal limits      RADIOLOGY I viewed and interpreted the patient's abdominal x-ray and concur with the radiologist that there is a moderate stool burden but no evidence of an emergent condition.   PROCEDURES:  Critical Care performed: No  Procedures    IMPRESSION / MDM / ASSESSMENT AND PLAN / ED COURSE  I reviewed the triage vital signs and the nursing notes.  Differential diagnosis includes, but is not limited to, constipation, medication side effect, electrolyte or metabolic abnormality, much less likely SBO or ileus.  Patient's presentation is most consistent with exacerbation of chronic illness.  Labs/studies ordered: CBG, abdominal x-ray  Interventions/Medications given:  Medications  lactulose (CHRONULAC) 10 GM/15ML solution 20 g (20 g Oral Given 04/02/23 0100)  Glycerin (Adult) 2 g suppository 1 suppository (1 suppository Rectal Given 04/02/23 0100)  docusate sodium (COLACE) capsule 100 mg (100 mg Oral Given 04/02/23 0100)    (Note:  hospital course my include additional interventions and/or labs/studies not listed above.)   Patient was triaged appropriately as a level 4 with stable vital signs.  She waited for a couple of hours and then was brought to a room.  At the time, I had 9 active patients including several critically ill patients.  I was updated by nursing staff that the patient was agitated and had been to the door of her room at least once or twice during the time that she was  waiting to see me, inquiring about why it is taking so long and why no one is checking on her.  When I went to evaluate her she was engaged in an impactor and conversation with the nurse, Edwena Felty, who is covering that particular assignment.  I tried to explain to the patient that I was here to see her but she was very agitated and upset and did not like the way I was speaking and states "I do not like your attitude".  I tried to explain that I had several critically ill patients but I needed to take care of which was why she had been waiting in her room for 45 minutes, but she stated that she was very upset that no one had come in to check on her, she was not given a call bell "so how can I call for help if I needed something", and states that she does not feel safe.  It was unclear to me at the time exactly why she was so upset given that she was alert, ambulatory, able to go to and from the door without difficulty and inquire about her weight and asked for anything that she might need.  Regardless, I attempted to apologize and de-escalate the situation with minimal success.  I had my usual constipation discussion and offered a digital rectal exam with manual disimpaction but she declined.  I suggested either an aggressive oral regimen or an enema or both and she would prefer the oral regimen.  I also suggested the use of a glycerin suppository to help soften any stool that might be in her rectal vault while the lactulose and docusate are working and she agreed with this plan.  I will reevaluate her after the medication has had a chance to work.  As documented above, the x-ray obtained in triage shows a moderate stool burden but no concerning findings suggestive of an emergent condition.     Clinical Course as of 04/02/23 6578  Sat Apr 02, 2023  0109 I was informed by one of the nurses that the patient is ready to go home.  I checked on her and she is crying and says she just wants to leave.  I asked her  if she wants to stay to see if the medications will work, if nothing else so that she does not have the lactulose and other medications start working while she is on her way home, but she said that she wants  to go and is calling for a ride.  She again declines a rectal exam to look for fecal impaction.  I am discharging her with my usual constipation management recommendations and outpatient follow-up recommendations.  I gave my usual return precautions.  No evidence of an acute or emergent medical condition at this time requiring inpatient treatment or additional imaging/assessment. [CF]    Clinical Course User Index [CF] Loleta Rose, MD     FINAL CLINICAL IMPRESSION(S) / ED DIAGNOSES   Final diagnoses:  Constipation, unspecified constipation type     Rx / DC Orders   ED Discharge Orders          Ordered    Ambulatory Referral to Primary Care (Establish Care)        04/02/23 0111             Note:  This document was prepared using Dragon voice recognition software and may include unintentional dictation errors.   Loleta Rose, MD 04/02/23 [phone]

## 2023-04-02 NOTE — Discharge Instructions (Addendum)
You were seen in the emergency room today for evaluation of your constipation.  It is over that you have a fecal impaction as well as some stercoral colitis.  For this, we will need to treat you with some Augmentin.  Take this twice daily for the next 7 days.  Additionally, recommend taking MiraLAX daily to help you with your constipation.  I have also given information for a GI specialist to follow-up with.  Please make sure you call them to schedule an appointment.  If you have any black or bloody stools, fever, chills, abdominal pain, nausea, vomiting, please return to your nearest emergency department for reevaluation.  If you have any concerns, new or worsening symptoms, please return to your nearest emerged department for evaluation.  Contact a health care provider if you: Have ongoing pain in your rectum. Need to use an enema or a suppository more than 2 times a week. Have rectal bleeding. Continue to have problems. The problems may include not being able to go to the bathroom and having long-term constipation. Have pain in your abdomen. Have thin, pencil-like stools. Get help right away if: You have black or tarry stools.

## 2023-04-02 NOTE — ED Triage Notes (Signed)
Pt c/o constipation, last BM x 1 week pta. Endorses rectal pressure. Recent tx yesterday for same with no improvement

## 2023-04-02 NOTE — ED Notes (Signed)
Pt was in doorway saying no one has been in her room "for 40 minutes." I explained the M.D.'s had just changed shifts and there are a lot of new patients and she would be seen as soon as possible. I asked her what I could do for her and she shut the door.

## 2023-04-02 NOTE — ED Notes (Signed)
Nurse to bedside with MD at this time

## 2023-04-02 NOTE — ED Provider Notes (Signed)
[place] EMERGENCY DEPARTMENT AT [facility] Provider Note   CSN: [id number] Arrival date & time: 04/02/23  1535     History Chief Complaint  Patient presents with   Constipation    [name] [name] is a 32 y.o. female with history of type 2 diabetes presents to the emergency room today for evaluation of constipation.  Patient reports her last bowel movement was last Saturday.  She has been experiencing some rectal pain over the past few days.  She denies any urinary symptoms such as dysuria or hematuria.  She denies any problems with urination.  She denies any vaginal discharge or vaginal bleeding.  She does report some nausea today but no vomiting.  She reports that for the past few years she has been getting these constipation episodes off and on.  She was seen at the ER last night and was discharged home with MiraLAX, laxative, as well as a suppository.  She reports that she still has not had a bowel movement.  She did not try any medications before or after this.  She denies any opiate use.  She is allergic to lisinopril.  Reports daily marijuana.   Constipation Associated symptoms: no abdominal pain, no diarrhea, no dysuria, no fever, no nausea and no vomiting        Home Medications Prior to Admission medications   Medication Sig Start Date End Date Taking? Authorizing Provider  amoxicillin-clavulanate (AUGMENTIN) 875-125 MG tablet Take 1 tablet by mouth 2 (two) times daily. Patient not taking: Reported on 02/08/2023 09/29/22   Blanchard Kelch, NP  fluconazole (DIFLUCAN) 150 MG tablet Take 1 tablet (150 mg total) by mouth every 3 (three) days. Patient not taking: Reported on 02/08/2023 09/29/22   Blanchard Kelch, NP  HYDROcodone-acetaminophen (NORCO/VICODIN) 5-325 MG tablet Take 1 tablet by mouth every 4 (four) hours as needed. Patient not taking: Reported on 02/08/2023 11/18/22   Minna Antis, MD  insulin glargine (LANTUS SOLOSTAR) 100 UNIT/ML Solostar Pen Inject  into the skin. 01/18/23   [provider]  liraglutide (VICTOZA) 18 MG/3ML SOPN Inject into the skin. 01/18/23   [provider]  losartan (COZAAR) 25 MG tablet Take 25 mg by mouth daily. 09/09/21   [provider]  metFORMIN (GLUCOPHAGE-XR) 500 MG 24 hr tablet Take 1,000 mg by mouth 2 (two) times daily. 09/09/21   [provider]  metroNIDAZOLE (FLAGYL) 500 MG tablet Take 1 tablet (500 mg total) by mouth 2 (two) times daily. 02/11/23   Lennart Pall, MD  mupirocin ointment (BACTROBAN) 2 % Apply 1 Application topically 3 (three) times daily. Patient not taking: Reported on 02/08/2023 09/17/22   Blanchard Kelch, NP  OZEMPIC, 0.25 OR 0.5 MG/DOSE, 2 MG/1.5ML SOPN Inject 0.25 mg into the skin once a week. Patient not taking: Reported on 02/08/2023 09/09/21   [provider]      Allergies    Lisinopril    Review of Systems   Review of Systems  Constitutional:  Negative for chills and fever.  Respiratory:  Negative for shortness of breath.   Cardiovascular:  Negative for chest pain.  Gastrointestinal:  Positive for constipation and rectal pain. Negative for abdominal pain, diarrhea, nausea and vomiting.  Genitourinary:  Negative for dysuria, hematuria, vaginal bleeding and vaginal discharge.    Physical Exam Updated Vital Signs BP (!) 137/95   Pulse 81   Temp 99.2 F (37.3 C) (Oral)   Resp 19   Ht 5\' 7"  (1.702 m)  Wt 127 kg   LMP 08/05/2022   SpO2 100%   BMI 43.85 kg/m  Physical Exam Vitals and nursing note reviewed. Exam conducted with a chaperone present Zella Ball, EMT-P).  Constitutional:      General: She is not in acute distress.    Appearance: Normal appearance. She is not toxic-appearing.     Comments: On phone in no acute distress  Eyes:     General: No scleral icterus. Cardiovascular:     Rate and Rhythm: Normal rate.  Pulmonary:     Effort: Pulmonary effort is normal. No respiratory distress.  Abdominal:     General: There is  no distension.     Palpations: Abdomen is soft.     Tenderness: There is no abdominal tenderness. There is no guarding or rebound.     Comments: She denies any tenderness to her abdomen with palpation of her belly however reports that she feels the need to have a bowel movement.  Genitourinary:    Comments: The patient does have some liquid fecal matter present on the buttocks and anus. No hemorrhoids visible. Large volume of stool in rectal vault.  Skin:    General: Skin is warm and dry.  Neurological:     General: No focal deficit present.     Mental Status: She is alert. Mental status is at baseline.  Psychiatric:        Mood and Affect: Mood normal.     ED Results / Procedures / Treatments   Labs (all labs ordered are listed, but only abnormal results are displayed) Labs Reviewed  CBC WITH DIFFERENTIAL/PLATELET - Abnormal; Notable for the following components:      Result Value   HCT 35.4 (*)    All other components within normal limits  COMPREHENSIVE METABOLIC PANEL - Abnormal; Notable for the following components:   Glucose, Bld 174 (*)    AST 9 (*)    All other components within normal limits  CBG MONITORING, ED - Abnormal; Notable for the following components:   Glucose-Capillary 162 (*)    All other components within normal limits  URINALYSIS, ROUTINE W REFLEX MICROSCOPIC  PREGNANCY, URINE    EKG None  Radiology CT ABDOMEN PELVIS W CONTRAST  Result Date: 04/02/2023 CLINICAL DATA:  Constipation.  Bowel obstruction suspected. EXAM: CT ABDOMEN AND PELVIS WITH CONTRAST TECHNIQUE: Multidetector CT imaging of the abdomen and pelvis was performed using the standard protocol following bolus administration of intravenous contrast. RADIATION DOSE REDUCTION: This exam was performed according to the departmental dose-optimization program which includes automated exposure control, adjustment of the mA and/or kV according to patient size and/or use of iterative reconstruction  technique. CONTRAST:  OMNIPAQUE IOHEXOL 300 MG/ML  SOLN COMPARISON:  11/18/2022 FINDINGS: Lower chest: Unremarkable Hepatobiliary: No suspicious focal abnormality within the liver parenchyma. There is no evidence for gallstones, gallbladder wall thickening, or pericholecystic fluid. No intrahepatic or extrahepatic biliary dilation. Pancreas: No focal mass lesion. No dilatation of the main duct. No intraparenchymal cyst. No peripancreatic edema. Spleen: No splenomegaly. No suspicious focal mass lesion. Adrenals/Urinary Tract: No adrenal nodule or mass. Kidneys unremarkable. No evidence for hydroureter. The urinary bladder appears normal for the degree of distention. Stomach/Bowel: Stomach is unremarkable. No gastric wall thickening. No evidence of outlet obstruction. Duodenum is normally positioned as is the ligament of Treitz. No small bowel wall thickening. No small bowel dilatation. The terminal ileum is normal. The appendix is normal. No gross colonic mass. No colonic wall thickening. There is mild  stool volume in the right and transverse colon. While the descending and sigmoid colon is largely free of stool, prominent stool is noted in the rectum with rectal diameter at 8.0 x 7.5 cm on transaxial imaging. There is mild perirectal edema/inflammation. Vascular/Lymphatic: No abdominal aortic aneurysm. No abdominal aortic atherosclerotic calcification. There is no gastrohepatic or hepatoduodenal ligament lymphadenopathy. No retroperitoneal or mesenteric lymphadenopathy. No pelvic sidewall lymphadenopathy. The upper normal groin lymph nodes seen previously are similar suggesting reactive etiology. Reproductive: Unremarkable. Other: No intraperitoneal free fluid. Musculoskeletal: No worrisome lytic or sclerotic osseous abnormality. Left-sided L5 pars interarticularis defect noted. IMPRESSION: 1. Prominent stool in the rectum with rectal diameter at 8.0 x 7.5 cm on transaxial imaging. There is mild perirectal  edema/inflammation. Imaging suggest impaction stercoral colitis not excluded. 2. No other acute findings in the abdomen or pelvis. Specifically, no findings to explain the patient's history of constipation. Electronically Signed   By: Kennith Center M.D.   On: 04/02/2023 18:50   DG Abdomen 1 View  Result Date: 04/01/2023 CLINICAL DATA:  Constipation EXAM: ABDOMEN - 1 VIEW COMPARISON:  CT 11/18/2022 FINDINGS: Moderate stool burden throughout the colon. Nonobstructive bowel gas pattern. No organomegaly, free air or suspicious calcification. IMPRESSION: Moderate stool burden. No acute findings. Electronically Signed   By: Charlett Nose M.D.   On: 04/01/2023 22:25    Procedures Fecal disimpaction  Date/Time: 04/02/2023 7:44 PM  Performed by: Achille Rich, PA-C Authorized by: Achille Rich, PA-C  Consent: Verbal consent obtained. Risks and benefits: risks, benefits and alternatives were discussed Consent given by: patient Patient understanding: patient states understanding of the procedure being performed Imaging studies: imaging studies available Patient identity confirmed: verbally with patient Comments: Patient was not able to tolerate fecal disimpaction.  I was able to break up some of the large stool burden present in the rectal vault.  She did have some fecal leakage before doing so.  No bleeding.  Will give her enema to see if she produces a bowel movement.    Medications Ordered in ED Medications  acetaminophen (TYLENOL) tablet 1,000 mg (1,000 mg Oral Given 04/02/23 1815)  iohexol (OMNIPAQUE) 300 MG/ML solution 100 mL (100 mLs Intravenous Contrast Given 04/02/23 1801)  LORazepam (ATIVAN) injection 1 mg (1 mg Intravenous Given 04/02/23 1859)    ED Course/ Medical Decision Making/ A&P Clinical Course as of 04/02/23 1934  Sat Apr 02, 2023  1757 The patient was sleeping on presentation to the room. Easily awakened. Discussed with patient about providing a urine sample. The patient refuses.  She reports that she signed a waiver last night to get the XR. She reports that she has a Nexplanon and isn't concerned. I discussed with her that it is not 100%. She would still like to proceed with the waiver. I called CT to make them aware.  [RR]    Clinical Course User Index [RR] Achille Rich, PA-C   Medical Decision Making Amount and/or Complexity of Data Reviewed Labs: ordered. Radiology: ordered.  Risk OTC drugs. Prescription drug management.   32 y.o. female presents to the ER for evaluation of constipation. Differential diagnosis includes but is not limited to stercoral colitis, small bowel obstruction, fecal impaction, constipation. Vital signs slightly elevated blood pressure otherwise unremarkable. Physical exam as noted above.   I independently reviewed and interpreted the patient's labs.  CMP shows mildly open glucose at 174 however patient does have type 2 diabetes.  Mildly decreased AST at 9 otherwise no electrolyte or LFT abnormality.  CBC  shows mild decrease in hematocrit 35.4 otherwise no leukocytosis.  Patient refused to provide urine sample.  I am unsure why the patient refuses to provide a urine sample.  She reports that she does not have any issues with urination.  CT imaging shows:  1. Prominent stool in the rectum with rectal diameter at 8.0 x 7.5  cm on transaxial imaging. There is mild perirectal  edema/inflammation. Imaging suggest impaction stercoral colitis not  excluded.  2. No other acute findings in the abdomen or pelvis. Specifically,  no findings to explain the patient's history of constipation.   I discussed with the patient about fecal impaction and options for treatment including digital disimpaction. The patient has already tried enemas, suppositories, miralax, and laxative without success. She is agreeable with the disimpaction.   The patient was requesting narcotics for fecal disimpaction. I discussed with her that narcotics cause constipation  and that would be counterintuitive. Settled on Ativan. She reports that she will be getting a ride home from her boyfriend, Ron.    Unfortunately, patient was not able to tolerate the entirety of the fecal disimpaction.  I was able to break up some of the large stool burden.  Will proceed with soapsuds enema and evaluate to see if she has a bowel movement.  On reevaluation, patient has had multiple bowel movements and reports that she is feeling much better.  Provided her follow-up with GI.  Recommended MiraLAX as well as adding fiber.  Given that it does show some stercoral colitis, I did order some Augmentin.  She is not febrile or ill-appearing.  I do not think she needs any admission and can treat this outpatient.  We discussed the results of the labs/imaging. The plan is a bowel regimen, follow-up with GI. We discussed strict return precautions and red flag symptoms. The patient verbalized their understanding and agrees to the plan. The patient is stable and being discharged home in good condition.  Portions of this report may have been transcribed using voice recognition software. Every effort was made to ensure accuracy; however, inadvertent computerized transcription errors may be present.   Final Clinical Impression(s) / ED Diagnoses Final diagnoses:  Fecal impaction [facility])    Rx / DC Orders ED Discharge Orders     None         Achille Rich, Cordelia Poche 04/03/23 2033    Vanetta Mulders, MD 04/05/23 [phone]

## 2023-04-02 NOTE — ED Notes (Signed)
Patient appears stable and ambulatory on discharge. Resp even and unlabored. Skin pwd. MAE.

## 2023-04-02 NOTE — Discharge Instructions (Signed)
You were seen in the emergency department today for constipation.  We recommend that you use one or more of the following over-the-counter medications in the order described:   1)  Miralax (powder):  This medication works by drawing additional fluid into your intestines and helps to flush out your stool.  Mix the powder with water or juice according to label instructions.  Be sure to use the recommended amount of water or juice when you mix up the powder.  Plenty of fluids will help to prevent constipation. 2)  Colace (or Dulcolax) 100 mg:  This is a stool softener, and you may take it once or twice a day as needed. 3)  Senna tablets:  This is a bowel stimulant that will help "push" out your stool. It is the next step to add after you have tried a stool softener. 4)  Magnesium citrate: This is typically sold in a clear glass bottle also purchased without the need for prescription.  You can drink the bottle and it typically stimulates your bowels within a short period of time.  You may also want to consider using glycerin suppositories, which you insert into your rectum.  You hold it in place and is dissolves and softens your stool and stimulates your bowels.  You could also consider using an enema, which is also available over the counter.  Remember that narcotic pain medications are constipating, so avoid them or minimize their use.  Drink plenty of fluids.  Please return to the Emergency Department immediately if you develop new or worsening symptoms that concern you, such as (but not limited to) fever > 101 degrees, severe abdominal pain, or persistent vomiting.

## 2023-04-02 NOTE — ED Notes (Addendum)
Patient came to door at this time requesting "water" and "call light."  RN to bedside immediately to assist patient and provide education. As RN was attempting to education on safety; however, patient interrupted and spoke over RN. RN attempted to deescalate patient. MD to bedside. RN ensure call light within reach. Patient became tearful and agitated towards MD. As MD greeted patient. Patient interrupted MD several times while trying to assess patient and patient needs. RN continuing to deescalate patient and provide emotional support.

## 2023-04-02 NOTE — ED Notes (Signed)
ED Provider at bedside. 

## 2023-08-23 ENCOUNTER — Emergency Department: Payer: Self-pay

## 2023-08-23 ENCOUNTER — Other Ambulatory Visit: Payer: Self-pay

## 2023-08-23 DIAGNOSIS — R0789 Other chest pain: Secondary | ICD-10-CM | POA: Insufficient documentation

## 2023-08-23 DIAGNOSIS — E119 Type 2 diabetes mellitus without complications: Secondary | ICD-10-CM | POA: Insufficient documentation

## 2023-08-23 LAB — CBC
HCT: 35.8 % — ABNORMAL LOW (ref 36.0–46.0)
Hemoglobin: 12.1 g/dL (ref 12.0–15.0)
MCH: 31.9 pg (ref 26.0–34.0)
MCHC: 33.8 g/dL (ref 30.0–36.0)
MCV: 94.5 fL (ref 80.0–100.0)
Platelets: 208 10*3/uL (ref 150–400)
RBC: 3.79 MIL/uL — ABNORMAL LOW (ref 3.87–5.11)
RDW: 11.9 % (ref 11.5–15.5)
WBC: 7.4 10*3/uL (ref 4.0–10.5)
nRBC: 0 % (ref 0.0–0.2)

## 2023-08-23 LAB — BASIC METABOLIC PANEL
Anion gap: 10 (ref 5–15)
BUN: 16 mg/dL (ref 6–20)
CO2: 23 mmol/L (ref 22–32)
Calcium: 8.7 mg/dL — ABNORMAL LOW (ref 8.9–10.3)
Chloride: 106 mmol/L (ref 98–111)
Creatinine, Ser: 0.67 mg/dL (ref 0.44–1.00)
GFR, Estimated: >60 mL/min (ref >60)
Glucose, Bld: 228 mg/dL — ABNORMAL HIGH (ref 70–99)
Potassium: 3.6 mmol/L (ref 3.5–5.1)
Sodium: 139 mmol/L (ref 135–145)

## 2023-08-23 LAB — TROPONIN I (HIGH SENSITIVITY): Troponin I (High Sensitivity): 2 ng/L (ref <18)

## 2023-08-23 NOTE — ED Triage Notes (Signed)
Pt reports chest pain x few days, pt states today she developed left arm pain. Pt denies cardiac hx.

## 2023-08-24 ENCOUNTER — Emergency Department
Admission: EM | Admit: 2023-08-24 | Discharge: 2023-08-24 | Disposition: A | Discharge status: Home / Self Care | Payer: Self-pay | Attending: Emergency Medicine | Admitting: Emergency Medicine

## 2023-08-24 DIAGNOSIS — R0789 Other chest pain: Secondary | ICD-10-CM

## 2023-08-24 LAB — HCG, QUANTITATIVE, PREGNANCY: hCG, Beta Chain, Quant, S: <1 m[IU]/mL (ref <5)

## 2023-08-24 LAB — TROPONIN I (HIGH SENSITIVITY): Troponin I (High Sensitivity): 3 ng/L (ref <18)

## 2023-08-24 NOTE — ED Notes (Signed)
Pt informed a urine sample is needed. She reports she does not need to urinate at this time.

## 2023-08-24 NOTE — ED Provider Notes (Signed)
[facility] Provider Note    Event Date/Time   First MD Initiated Contact with Patient 08/24/23 0158     (approximate)   History   Chief Complaint Chest Pain   HPI  [name] [name] is a 33 y.o. female with past medical history of diabetes, PCOS, and Cushing's syndrome who presents to the ED complaining of chest pain.  Patient reports that she has had intermittent dull aching pain in the center of her chest for the past 3 days.  Pain does not seem to be exacerbated or alleviated by anything in particular.  She became more concerned today when pain started to spread towards both of her arms.  She denies any fevers, cough, or difficulty breathing.  She has not noticed any pain or swelling in her legs.  She does work as a Editor, commissioning heavy patients frequently.     Physical Exam   Triage Vital Signs: ED Triage Vitals  Encounter Vitals Group     BP 08/23/23 2241 (!) 151/98     Systolic BP Percentile --      Diastolic BP Percentile --      Pulse Rate 08/23/23 2241 87     Resp 08/23/23 2241 18     Temp 08/23/23 2241 98.3 F (36.8 C)     Temp Source 08/23/23 2241 Oral     SpO2 08/23/23 2241 98 %     Weight 08/23/23 2240 270 lb (122.5 kg)     Height 08/23/23 2240 5\' 7"  (1.702 m)     Head Circumference --      Peak Flow --      Pain Score 08/23/23 2240 6     Pain Loc --      Pain Education --      Exclude from Growth Chart --     Most recent vital signs: Vitals:   08/23/23 2241  BP: (!) 151/98  Pulse: 87  Resp: 18  Temp: 98.3 F (36.8 C)  SpO2: 98%    Constitutional: Alert and oriented. Eyes: Conjunctivae are normal. Head: Atraumatic. Nose: No congestion/rhinnorhea. Mouth/Throat: Mucous membranes are moist.  Cardiovascular: Normal rate, regular rhythm. Grossly normal heart sounds.  2+ radial pulses bilaterally. Respiratory: Normal respiratory effort.  No retractions. Lungs CTAB.  No chest wall tenderness to palpation  noted. Gastrointestinal: Soft and nontender. No distention. Musculoskeletal: No lower extremity tenderness nor edema.  Neurologic:  Normal speech and language. No gross focal neurologic deficits are appreciated.    ED Results / Procedures / Treatments   Labs (all labs ordered are listed, but only abnormal results are displayed) Labs Reviewed  BASIC METABOLIC PANEL - Abnormal; Notable for the following components:      Result Value   Glucose, Bld 228 (*)    Calcium 8.7 (*)    All other components within normal limits  CBC - Abnormal; Notable for the following components:   RBC 3.79 (*)    HCT 35.8 (*)    All other components within normal limits  HCG, QUANTITATIVE, PREGNANCY  POC URINE PREG, ED  TROPONIN I (HIGH SENSITIVITY)  TROPONIN I (HIGH SENSITIVITY)     EKG  ED ECG REPORT I, Chesley Noon, the attending physician, personally viewed and interpreted this ECG.   Date: 08/24/2023  EKG Time: 22:43  Rate: 88  Rhythm: normal sinus rhythm  Axis: Normal  Intervals:none  ST&T Change: None  RADIOLOGY Chest x-ray reviewed and interpreted by me with no infiltrate, edema, or  effusion.  PROCEDURES:  Critical Care performed: No  Procedures   MEDICATIONS ORDERED IN ED: Medications - No data to display   IMPRESSION / MDM / ASSESSMENT AND PLAN / ED COURSE  I reviewed the triage vital signs and the nursing notes.                              33 y.o. female with past medical history of diabetes, PCOS, and Cushing syndrome who presents to the ED complaining of intermittent chest discomfort for the past 3 days now radiating to both arms.  Patient's presentation is most consistent with acute presentation with potential threat to life or bodily function.  Differential diagnosis includes, but is not limited to, ACS, PE, pneumonia, pneumothorax, musculoskeletal pain, GERD, anxiety.  Patient nontoxic-appearing and in no acute distress, vital signs are unremarkable.  EKG  shows no evidence of arrhythmia or ischemia and 2 sets of troponin are within normal limits.  I doubt PE given atypical symptoms with reassuring vital signs, chest x-ray is unremarkable.  Labs without significant anemia, leukocytosis, electrolyte abnormality, or AKI.  Pregnancy testing is negative and patient is appropriate for discharge home with outpatient follow-up, suspect musculoskeletal etiology.  She was counseled to return to the ED for new or worsening symptoms, patient agrees with plan.      FINAL CLINICAL IMPRESSION(S) / ED DIAGNOSES   Final diagnoses:  Atypical chest pain     Rx / DC Orders   ED Discharge Orders     None        Note:  This document was prepared using Dragon voice recognition software and may include unintentional dictation errors.   Chesley Noon, MD 08/24/23 [phone]

## 2023-09-23 ENCOUNTER — Encounter (HOSPITAL_BASED_OUTPATIENT_CLINIC_OR_DEPARTMENT_OTHER): Payer: Self-pay

## 2023-09-23 ENCOUNTER — Emergency Department (HOSPITAL_BASED_OUTPATIENT_CLINIC_OR_DEPARTMENT_OTHER)

## 2023-09-23 ENCOUNTER — Emergency Department (HOSPITAL_BASED_OUTPATIENT_CLINIC_OR_DEPARTMENT_OTHER)
Admission: EM | Admit: 2023-09-23 | Discharge: 2023-09-23 | Disposition: A | Discharge status: Home / Self Care | Attending: Emergency Medicine | Admitting: Emergency Medicine

## 2023-09-23 ENCOUNTER — Other Ambulatory Visit: Payer: Self-pay

## 2023-09-23 DIAGNOSIS — I10 Essential (primary) hypertension: Secondary | ICD-10-CM | POA: Diagnosis not present

## 2023-09-23 DIAGNOSIS — Z79899 Other long term (current) drug therapy: Secondary | ICD-10-CM | POA: Insufficient documentation

## 2023-09-23 DIAGNOSIS — E119 Type 2 diabetes mellitus without complications: Secondary | ICD-10-CM | POA: Insufficient documentation

## 2023-09-23 DIAGNOSIS — R519 Headache, unspecified: Secondary | ICD-10-CM | POA: Diagnosis present

## 2023-09-23 DIAGNOSIS — Z794 Long term (current) use of insulin: Secondary | ICD-10-CM | POA: Diagnosis not present

## 2023-09-23 DIAGNOSIS — Z7984 Long term (current) use of oral hypoglycemic drugs: Secondary | ICD-10-CM | POA: Diagnosis not present

## 2023-09-23 LAB — CBC WITH DIFFERENTIAL/PLATELET
Abs Immature Granulocytes: 0.02 10*3/uL (ref 0.00–0.07)
Basophils Absolute: 0 10*3/uL (ref 0.0–0.1)
Basophils Relative: 0 %
Eosinophils Absolute: 0 10*3/uL (ref 0.0–0.5)
Eosinophils Relative: 1 %
HCT: 34 % — ABNORMAL LOW (ref 36.0–46.0)
Hemoglobin: 11.8 g/dL — ABNORMAL LOW (ref 12.0–15.0)
Immature Granulocytes: 0 %
Lymphocytes Relative: 11 %
Lymphs Abs: 0.6 10*3/uL — ABNORMAL LOW (ref 0.7–4.0)
MCH: 31.6 pg (ref 26.0–34.0)
MCHC: 34.7 g/dL (ref 30.0–36.0)
MCV: 91.2 fL (ref 80.0–100.0)
Monocytes Absolute: 0.2 10*3/uL (ref 0.1–1.0)
Monocytes Relative: 5 %
Neutro Abs: 4.2 10*3/uL (ref 1.7–7.7)
Neutrophils Relative %: 83 %
Platelets: 179 10*3/uL (ref 150–400)
RBC: 3.73 MIL/uL — ABNORMAL LOW (ref 3.87–5.11)
RDW: 11.8 % (ref 11.5–15.5)
WBC: 5.1 10*3/uL (ref 4.0–10.5)
nRBC: 0 % (ref 0.0–0.2)

## 2023-09-23 LAB — BASIC METABOLIC PANEL
Anion gap: 8 (ref 5–15)
BUN: 16 mg/dL (ref 6–20)
CO2: 24 mmol/L (ref 22–32)
Calcium: 8.7 mg/dL — ABNORMAL LOW (ref 8.9–10.3)
Chloride: 104 mmol/L (ref 98–111)
Creatinine, Ser: 0.62 mg/dL (ref 0.44–1.00)
GFR, Estimated: >60 mL/min (ref >60)
Glucose, Bld: 174 mg/dL — ABNORMAL HIGH (ref 70–99)
Potassium: 3.5 mmol/L (ref 3.5–5.1)
Sodium: 136 mmol/L (ref 135–145)

## 2023-09-23 LAB — HCG, SERUM, QUALITATIVE: Preg, Serum: NEGATIVE

## 2023-09-23 MED ORDER — ACETAMINOPHEN 500 MG PO TABS
1000.0000 mg | ORAL_TABLET | Freq: Once | ORAL | Status: AC
  Administered 2023-09-23: 1000 mg via ORAL
  Filled 2023-09-23: qty 2

## 2023-09-23 MED ORDER — METOCLOPRAMIDE HCL 5 MG/ML IJ SOLN
10.0000 mg | Freq: Once | INTRAMUSCULAR | Status: AC
  Administered 2023-09-23: 10 mg via INTRAVENOUS
  Filled 2023-09-23: qty 2

## 2023-09-23 MED ORDER — KETOROLAC TROMETHAMINE 30 MG/ML IJ SOLN
30.0000 mg | Freq: Once | INTRAMUSCULAR | Status: DC
  Filled 2023-09-23: qty 1

## 2023-09-23 MED ORDER — SODIUM CHLORIDE 0.9 % IV BOLUS
1000.0000 mL | Freq: Once | INTRAVENOUS | Status: AC
  Administered 2023-09-23: 1000 mL via INTRAVENOUS

## 2023-09-23 NOTE — Discharge Instructions (Signed)
 Take ibuprofen 600 mg rotated with Tylenol 1000 mg every 4 hours as needed for pain.  Continue taking losartan as previously prescribed.  Keep a record of your blood pressures at home and follow-up with your primary doctor in the next 1 to 2 weeks.

## 2023-09-23 NOTE — ED Provider Notes (Signed)
 [name] [name] EMERGENCY DEPARTMENT AT [facility] Provider Note   CSN: [id number] Arrival date & time: 09/23/23  9562     History  Chief Complaint  Patient presents with   Headache    [name] [name] is a 33 y.o. female.  Patient is a 33 year old female with past medical history of diabetes on insulin, Cushing's disease, PCOS.  Patient presenting today with complaints of elevated blood pressure and headache.  She has been checking her blood pressures at work recently and getting readings up to 190s systolic.  She was seen by her primary doctor who prescribed losartan which she began taking today.  She has had a headache throughout the day and is concerned this may be related to her blood pressure.  She denies any injury or trauma.  She denies any weakness, numbness, or visual disturbances.  No fevers, chills, or stiff neck.  She tells me she has taken Tylenol this evening with no improvement.  The history is provided by the patient.       Home Medications Prior to Admission medications   Medication Sig Start Date End Date Taking? Authorizing Provider  amoxicillin-clavulanate (AUGMENTIN) 875-125 MG tablet Take 1 tablet by mouth 2 (two) times daily. 04/02/23   Achille Rich, PA-C  fluconazole (DIFLUCAN) 150 MG tablet Take 1 tablet (150 mg total) by mouth every 3 (three) days. Patient not taking: Reported on 02/08/2023 09/29/22   Blanchard Kelch, NP  HYDROcodone-acetaminophen (NORCO/VICODIN) 5-325 MG tablet Take 1 tablet by mouth every 4 (four) hours as needed. Patient not taking: Reported on 02/08/2023 11/18/22   Minna Antis, MD  insulin glargine (LANTUS SOLOSTAR) 100 UNIT/ML Solostar Pen Inject into the skin. 01/18/23   [provider]  liraglutide (VICTOZA) 18 MG/3ML SOPN Inject into the skin. 01/18/23   [provider]  losartan (COZAAR) 25 MG tablet Take 25 mg by mouth daily. 09/09/21   [provider]  metFORMIN (GLUCOPHAGE-XR) 500 MG 24 hr tablet  Take 1,000 mg by mouth 2 (two) times daily. 09/09/21   [provider]  mupirocin ointment (BACTROBAN) 2 % Apply 1 Application topically 3 (three) times daily. Patient not taking: Reported on 02/08/2023 09/17/22   Blanchard Kelch, NP  OZEMPIC, 0.25 OR 0.5 MG/DOSE, 2 MG/1.5ML SOPN Inject 0.25 mg into the skin once a week. Patient not taking: Reported on 02/08/2023 09/09/21   [provider]      Allergies    Lisinopril    Review of Systems   Review of Systems  All other systems reviewed and are negative.   Physical Exam Updated Vital Signs BP 134/87   Pulse (!) 105   Temp 98.4 F (36.9 C) (Oral)   Resp 18   Ht 5\' 7"  (1.702 m)   Wt 117.9 kg   SpO2 98%   BMI 40.72 kg/m  Physical Exam Vitals and nursing note reviewed.  Constitutional:      General: She is not in acute distress.    Appearance: She is well-developed. She is not diaphoretic.  HENT:     Head: Normocephalic and atraumatic.  Eyes:     Extraocular Movements: Extraocular movements intact.     Pupils: Pupils are equal, round, and reactive to light.  Cardiovascular:     Rate and Rhythm: Normal rate and regular rhythm.     Heart sounds: No murmur heard.    No friction rub. No gallop.  Pulmonary:     Effort: Pulmonary effort is normal. No respiratory distress.  Breath sounds: Normal breath sounds. No wheezing.  Abdominal:     General: Bowel sounds are normal. There is no distension.     Palpations: Abdomen is soft.     Tenderness: There is no abdominal tenderness.  Musculoskeletal:        General: Normal range of motion.     Cervical back: Normal range of motion and neck supple.  Skin:    General: Skin is warm and dry.  Neurological:     General: No focal deficit present.     Mental Status: She is alert and oriented to person, place, and time. Mental status is at baseline.     Cranial Nerves: No cranial nerve deficit or facial asymmetry.     ED Results / Procedures / Treatments   Labs (all  labs ordered are listed, but only abnormal results are displayed) Labs Reviewed - No data to display  EKG None  Radiology No results found.  Procedures Procedures    Medications Ordered in ED Medications  sodium chloride 0.9 % bolus 1,000 mL (has no administration in time range)  ketorolac (TORADOL) 30 MG/ML injection 30 mg (has no administration in time range)  metoCLOPramide (REGLAN) injection 10 mg (has no administration in time range)    ED Course/ Medical Decision Making/ A&P  Patient is a 33 year old female with past medical history as per HPI presenting with headache and elevated blood pressure.  Patient arrives here with stable vital signs and is afebrile.  Initial blood pressures are in the 130s systolic and physical examination unremarkable.  Laboratory studies obtained including CBC, metabolic panel, and serum pregnancy test.  All studies basically unremarkable.  Her sugar is mildly elevated at 174, but no other electrolyte abnormality.  CT scan of the head obtained showing no acute intracranial process.  Patient has been given Toradol and Reglan for her headache and seems to be feeling better.  Nothing emergent has shown up in her workup and I feel as though she can safely be discharged.  Cause of her headache unclear, but nothing appears emergent.  I doubt related to her blood pressure as it is relatively normal here.  Final Clinical Impression(s) / ED Diagnoses Final diagnoses:  None    Rx / DC Orders ED Discharge Orders     None         Geoffery Lyons, MD 09/23/23 [phone]

## 2023-09-23 NOTE — ED Triage Notes (Addendum)
 Says she has had a headache all day and thinks its related to her recent diagnosis of HTN.   Took tylenol with minimal improvement.

## 2023-11-19 ENCOUNTER — Other Ambulatory Visit: Payer: Self-pay

## 2023-11-19 ENCOUNTER — Emergency Department (HOSPITAL_BASED_OUTPATIENT_CLINIC_OR_DEPARTMENT_OTHER)
Admission: EM | Admit: 2023-11-19 | Discharge: 2023-11-19 | Disposition: A | Discharge status: Home / Self Care | Attending: Emergency Medicine | Admitting: Emergency Medicine

## 2023-11-19 ENCOUNTER — Encounter (HOSPITAL_BASED_OUTPATIENT_CLINIC_OR_DEPARTMENT_OTHER): Payer: Self-pay

## 2023-11-19 DIAGNOSIS — J029 Acute pharyngitis, unspecified: Secondary | ICD-10-CM | POA: Insufficient documentation

## 2023-11-19 DIAGNOSIS — Z87891 Personal history of nicotine dependence: Secondary | ICD-10-CM | POA: Diagnosis not present

## 2023-11-19 DIAGNOSIS — H9201 Otalgia, right ear: Secondary | ICD-10-CM | POA: Insufficient documentation

## 2023-11-19 DIAGNOSIS — Z7984 Long term (current) use of oral hypoglycemic drugs: Secondary | ICD-10-CM | POA: Insufficient documentation

## 2023-11-19 DIAGNOSIS — Z794 Long term (current) use of insulin: Secondary | ICD-10-CM | POA: Insufficient documentation

## 2023-11-19 DIAGNOSIS — E119 Type 2 diabetes mellitus without complications: Secondary | ICD-10-CM | POA: Diagnosis not present

## 2023-11-19 LAB — RESP PANEL BY RT-PCR (RSV, FLU A&B, COVID)  RVPGX2
Influenza A by PCR: NEGATIVE
Influenza B by PCR: NEGATIVE
Resp Syncytial Virus by PCR: NEGATIVE
SARS Coronavirus 2 by RT PCR: NEGATIVE

## 2023-11-19 MED ORDER — FLUCONAZOLE 150 MG PO TABS: 150.0000 mg | ORAL_TABLET | Freq: Once | ORAL | 0 refills | Status: AC

## 2023-11-19 MED ORDER — AMOXICILLIN 500 MG PO CAPS: 500.0000 mg | ORAL_CAPSULE | Freq: Two times a day (BID) | ORAL | 0 refills | Status: DC

## 2023-11-19 NOTE — Discharge Instructions (Signed)
 As discussed, it does appear like you have a right-sided ear infection.  Will put you on antibiotics for this.  Recommend use of Tylenol /Motrin  for pain.  Follow MyChart for the results of your viral testing.  Please do not hesitate to return if the worrisome signs and symptoms we discussed become apparent.

## 2023-11-19 NOTE — ED Provider Notes (Signed)
 [name] EMERGENCY DEPARTMENT AT [facility] Provider Note   CSN: [id number] Arrival date & time: 11/19/23  1443     History  Chief Complaint  Patient presents with   Otalgia    [name] [name] is a 33 y.o. female.   Otalgia   33 year old female presents emergency department plaints of right-sided ear pain, sore throat.  States that she began with symptoms yesterday.  Denies any fevers, chills, difficulty breathing/swallowing, feelings of throat closing on her, cough, nasal congestion.  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  Past medical history significant for Cushing syndrome, PCOS, diabetes mellitus  Home Medications Prior to Admission medications   Medication Sig Start Date End Date Taking? Authorizing Provider  amoxicillin  (AMOXIL ) 500 MG capsule Take 1 capsule (500 mg total) by mouth 2 (two) times daily. 11/19/23  Yes Neil Balls A, PA  fluconazole  (DIFLUCAN ) 150 MG tablet Take 1 tablet (150 mg total) by mouth once for 1 dose. You may take second dose 72 hours after initial dose if you are still symptomatic. 11/19/23 11/19/23 Yes Custer Butter, PA  insulin  glargine (LANTUS  SOLOSTAR) 100 UNIT/ML Solostar Pen Inject into the skin. 01/18/23  Yes [provider]  liraglutide (VICTOZA) 18 MG/3ML SOPN Inject into the skin. 01/18/23   [provider]  losartan (COZAAR) 25 MG tablet Take 25 mg by mouth daily. 09/09/21  Yes [provider]  metFORMIN  (GLUCOPHAGE -XR) 500 MG 24 hr tablet Take 1,000 mg by mouth 2 (two) times daily. 09/09/21  Yes [provider]      Allergies    Lisinopril     Review of Systems   Review of Systems  HENT:  Positive for ear pain.   All other systems reviewed and are negative.   Physical Exam Updated Vital Signs BP (!) 160/90 (BP Location: Right Arm)   Pulse 77   Temp 98.8 F (37.1 C) (Oral)   Resp 16   Ht 5\' 7"  (1.702 m)   Wt 122.5 kg   SpO2  98%   BMI 42.29 kg/m  Physical Exam Vitals and nursing note reviewed.  Constitutional:      General: She is not in acute distress.    Appearance: She is well-developed.  HENT:     Head: Normocephalic and atraumatic.     Right Ear: Ear canal and external ear normal.     Left Ear: Tympanic membrane, ear canal and external ear normal.     Ears:     Comments: Patient slightly erythematous appearing right-sided TM.  Purulent middle ear effusion lower third deep to TM.  TM intact.    Mouth/Throat:     Comments: Mild posterior pharyngeal erythema.  Uvula midline symmetrical phonation.  No sublingual extremity or swelling.  No trismus.  Tonsils 1+ bilaterally without exudate Eyes:     Conjunctiva/sclera: Conjunctivae normal.  Cardiovascular:     Rate and Rhythm: Normal rate and regular rhythm.     Heart sounds: No murmur heard. Pulmonary:     Effort: Pulmonary effort is normal. No respiratory distress.     Breath sounds: Normal breath sounds.  Abdominal:     Palpations: Abdomen is soft.     Tenderness: There is no abdominal tenderness.  Musculoskeletal:        General: No swelling.     Cervical back: Neck supple.  Skin:    General: Skin is warm and dry.     Capillary Refill: Capillary  refill takes less than 2 seconds.  Neurological:     Mental Status: She is alert.  Psychiatric:        Mood and Affect: Mood normal.     ED Results / Procedures / Treatments   Labs (all labs ordered are listed, but only abnormal results are displayed) Labs Reviewed  RESP PANEL BY RT-PCR (RSV, FLU A&B, COVID)  [id number]    EKG None  Radiology No results found.  Procedures Procedures    Medications Ordered in ED Medications - No data to display  ED Course/ Medical Decision Making/ A&P                                 Medical Decision Making Risk Prescription drug management.   This patient presents to the ED for concern of ear pain, this involves an extensive number of treatment  options, and is a complaint that carries with it a high risk of complications and morbidity.  The differential diagnosis includes otitis media, otitis externa, viral URI, other  Co morbidities that complicate the patient evaluation  See HPI   Additional history obtained:  Additional history obtained from EMR External records from outside source obtained and reviewed including hospital records   Lab Tests:  I Ordered, and personally interpreted labs.  The pertinent results include: Viral testing pending.   Imaging Studies ordered:  N/a   Cardiac Monitoring: / EKG:  N/a   Consultations Obtained:  N/a   Problem List / ED Course / Critical interventions / Medication management  Otalgia Reevaluation of the patient showed that the patient stayed the same I have reviewed the patients home medicines and have made adjustments as needed   Social Determinants of Health:  Former cigarette use.  Denies illicit drug use.   Test / Admission - Considered:  Otalgia Vitals signs significant for hypertension blood pressure 160/90. Otherwise within normal range and stable throughout visit. Laboratory studies significant for: See above 33 year old female presents emergency department plaints of right-sided ear pain, sore throat.  States that she began with symptoms yesterday.  Denies any fevers, chills, difficulty breathing/swallowing, feelings of throat closing on her, cough, nasal congestion.  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 On exam, purulent middle ear effusion with slightly erythematous right-sided TM present.  Suspect the patient's symptoms likely secondary to right-sided otitis media.  Will treat with antibiotics and recommended additional supportive therapy as described in AVS.  Recommend follow-up with PCP in the outpatient setting for reassessment.  Treatment plan discussed with patient and she acknowledged  understanding was agreeable to said plan.  Patient well-appearing, afebrile in no acute distress. Worrisome signs and symptoms were discussed with the patient, and the patient acknowledged understanding to return to the ED if noticed. Patient was stable upon discharge.          Final Clinical Impression(s) / ED Diagnoses Final diagnoses:  None    Rx / DC Orders ED Discharge Orders          Ordered    amoxicillin  (AMOXIL ) 500 MG capsule  2 times daily        11/19/23 1514    fluconazole  (DIFLUCAN ) 150 MG tablet   Once        11/19/23 1514              McKenney Butter, [place] 11/19/23 1534    Martina Sledge,  Frederica Jakob, DO 11/21/23 [phone]

## 2023-11-19 NOTE — ED Triage Notes (Signed)
 In for right ear pain that started yesterday and sore throat that started last night. Denies fever, chills, or cough.

## 2024-07-08 ENCOUNTER — Ambulatory Visit (HOSPITAL_COMMUNITY): Admission: EM | Admit: 2024-07-08 | Discharge: 2024-07-08 | Disposition: A | Discharge status: Home / Self Care

## 2024-07-08 ENCOUNTER — Encounter (HOSPITAL_COMMUNITY): Payer: Self-pay

## 2024-07-08 DIAGNOSIS — S39012A Strain of muscle, fascia and tendon of lower back, initial encounter: Secondary | ICD-10-CM | POA: Diagnosis not present

## 2024-07-08 MED ORDER — DICLOFENAC SODIUM 50 MG PO TBEC: 50.0000 mg | DELAYED_RELEASE_TABLET | Freq: Two times a day (BID) | ORAL | 1 refills | Status: AC

## 2024-07-08 MED ORDER — CYCLOBENZAPRINE HCL 10 MG PO TABS: 10.0000 mg | ORAL_TABLET | Freq: Two times a day (BID) | ORAL | 0 refills | Status: AC | PRN

## 2024-07-08 NOTE — Discharge Instructions (Addendum)
" °  1. Strain of lumbar region, initial encounter (Primary) - diclofenac  (VOLTAREN ) 50 MG EC tablet; Take 1 tablet (50 mg total) by mouth 2 (two) times daily.  Dispense: 30 tablet; Refill: 1 - cyclobenzaprine  (FLEXERIL ) 10 MG tablet; Take 1 tablet (10 mg total) by mouth 2 (two) times daily as needed for muscle spasms.  Dispense: 20 tablet; Refill: 0 - No imaging completed during visit due to most likely cause of symptoms to be soft tissue in nature, no significant trauma enough to cause fracture or disc injury.  -Continue to monitor symptoms for any change in severity if there is any escalation of current symptoms or development of new symptoms follow-up in ER for further evaluation and management. "

## 2024-07-08 NOTE — ED Triage Notes (Signed)
 Patient presents with lower back pain that started yesterday after bending over picking up a bag.

## 2024-07-08 NOTE — ED Provider Notes (Signed)
 " [facility]  Note:  This document was prepared using Dragon voice recognition software and may include unintentional dictation errors.  MRN: [id number] DOB: June 22, 1991  Subjective:   [name] [name] is a 34 y.o. female presenting for lower back pain since yesterday.  Patient reports she bent over to pick up a bag and when she tried to stand back up she felt a pop in her lower back and has had persistent pain since injury occurred.  Patient has been using topical pain relieving gel with minimal improvement to symptoms.  Patient also states that she took a muscle relaxant that she had at home which also did not seem to resolve symptoms.  Patient denies any past history of back pain or trauma.  Current Medications[1]   Allergies[2]  Past Medical History:  Diagnosis Date   Cellulitis of right leg 08/14/2019   Cushing syndrome    Diabetes mellitus    Type II   Gonorrhea    Hirsutism    PCOS (polycystic ovarian syndrome)    Vaginal dryness 04/20/2019   Yeast vaginitis 04/20/2019     Past Surgical History:  Procedure Laterality Date   ABSCESS DRAINAGE     THERAPEUTIC ABORTION     TOOTH EXTRACTION      Family History  Problem Relation Age of Onset   Epilepsy Father    Cancer Father        lung cancer   COPD Maternal Grandmother    Hypertension Maternal Grandmother    Heart disease Maternal Grandmother    Cancer Paternal Grandfather     Social History[3]  ROS Refer to HPI for ROS details.  Objective:    Vitals: BP (!) 135/94 (BP Location: Left Arm)   Pulse 79   Temp 98.9 F (37.2 C) (Oral)   Resp 16   SpO2 95%   Physical Exam Vitals and nursing note reviewed.  Constitutional:      General: She is not in acute distress.    Appearance: She is well-developed. She is not ill-appearing or toxic-appearing.  HENT:     Head: Normocephalic and atraumatic.  Cardiovascular:     Rate and Rhythm: Normal rate.  Pulmonary:     Effort: Pulmonary effort is  normal. No respiratory distress.     Breath sounds: No stridor. No wheezing.  Musculoskeletal:     Lumbar back: Spasms and tenderness present. No swelling, deformity or bony tenderness. Decreased range of motion. Negative right straight leg raise test and negative left straight leg raise test.  Skin:    General: Skin is warm and dry.  Neurological:     General: No focal deficit present.     Mental Status: She is alert and oriented to person, place, and time.  Psychiatric:        Mood and Affect: Mood normal.        Behavior: Behavior normal.     Procedures  No results found for this or any previous visit (from the past 24 hours).  Assessment and Plan :     Discharge Instructions       1. Strain of lumbar region, initial encounter (Primary) - diclofenac  (VOLTAREN ) 50 MG EC tablet; Take 1 tablet (50 mg total) by mouth 2 (two) times daily.  Dispense: 30 tablet; Refill: 1 - cyclobenzaprine  (FLEXERIL ) 10 MG tablet; Take 1 tablet (10 mg total) by mouth 2 (two) times daily as needed for muscle spasms.  Dispense: 20 tablet; Refill: 0 - No imaging completed during  visit due to most likely cause of symptoms to be soft tissue in nature, no significant trauma enough to cause fracture or disc injury.  -Continue to monitor symptoms for any change in severity if there is any escalation of current symptoms or development of new symptoms follow-up in ER for further evaluation and management.      Marylen Zuk [address] K[address]    [1] No current facility-administered medications for this encounter.  Current Outpatient Medications:    cyclobenzaprine  (FLEXERIL ) 10 MG tablet, Take 1 tablet (10 mg total) by mouth 2 (two) times daily as needed for muscle spasms., Disp: 20 tablet, Rfl: 0   diclofenac  (VOLTAREN ) 50 MG EC tablet, Take 1 tablet (50 mg total) by mouth 2 (two) times daily., Disp: 30 tablet, Rfl: 1   insulin  glargine (LANTUS  SOLOSTAR) 100 UNIT/ML Solostar Pen, Inject into the skin., Disp: ,  Rfl:    liraglutide (VICTOZA) 18 MG/3ML SOPN, Inject into the skin., Disp: , Rfl:    losartan (COZAAR) 25 MG tablet, Take 25 mg by mouth daily., Disp: , Rfl:    metFORMIN  (GLUCOPHAGE -XR) 500 MG 24 hr tablet, Take 1,000 mg by mouth 2 (two) times daily., Disp: , Rfl:  [2]  Allergies Allergen Reactions   Lisinopril  Other (See Comments), Shortness Of Breath and Anaphylaxis    Chest pains Chest pains and tightness tachycardia  [3]  Social History Tobacco Use   Smoking status: Former    Current packs/day: 0.20    Types: Cigarettes   Smokeless tobacco: Never   Tobacco comments:    States she goes through about 1 pack every 5 days, working on cutting back  Vaping Use   Vaping status: Never Used  Substance Use Topics   Alcohol  use: Yes    Comment: occ   Drug use: Yes    Types: Marijuana    Comment: daily marijuana use     Aurea Goodell B, NP 07/08/24 [phone]  "

## 2024-07-26 ENCOUNTER — Ambulatory Visit (HOSPITAL_COMMUNITY)
Admission: EM | Admit: 2024-07-26 | Discharge: 2024-07-26 | Disposition: A | Discharge status: Home / Self Care | Attending: Emergency Medicine | Admitting: Emergency Medicine

## 2024-07-26 ENCOUNTER — Encounter (HOSPITAL_COMMUNITY): Payer: Self-pay

## 2024-07-26 DIAGNOSIS — R52 Pain, unspecified: Secondary | ICD-10-CM | POA: Diagnosis not present

## 2024-07-26 DIAGNOSIS — J069 Acute upper respiratory infection, unspecified: Secondary | ICD-10-CM

## 2024-07-26 LAB — POCT INFLUENZA A/B
Influenza A, POC: NEGATIVE
Influenza B, POC: NEGATIVE

## 2024-07-26 LAB — POC SOFIA SARS ANTIGEN FIA: SARS Coronavirus 2 Ag: NEGATIVE

## 2024-07-26 MED ORDER — PROMETHAZINE-DM 6.25-15 MG/5ML PO SYRP: 5.0000 mL | ORAL_SOLUTION | Freq: Four times a day (QID) | ORAL | 0 refills | Status: AC | PRN

## 2024-07-26 NOTE — ED Provider Notes (Signed)
 " [facility]    CSN: [id number] Arrival date & time: 07/26/24  1604      History   Chief Complaint Chief Complaint  Patient presents with   Cough    HPI MOZETTA MURFIN is a 34 y.o. female.   Patient presents to clinic over concern of a productive cough with yellow sputum, sore throat, voice loss, body aches as well as hot and cold chills.  Symptoms started yesterday.  She took NyQuil last night without any improvement in her cough, reports she was up all night coughing.  Unsure of recent sick contacts, she does work at a nursing home.  Has not had nausea, vomiting or diarrhea.  Has not had documented fevers.  Denies wheezing or shortness of breath.    The history is provided by the patient and medical records.  Cough   Past Medical History:  Diagnosis Date   Cellulitis of right leg 08/14/2019   Cushing syndrome    Diabetes mellitus    Type II   Gonorrhea    Hirsutism    PCOS (polycystic ovarian syndrome)    Vaginal dryness 04/20/2019   Yeast vaginitis 04/20/2019    Patient Active Problem List   Diagnosis Date Noted   Vaginal discharge 09/18/2019   Implanon  in place 09/18/2019   Screening for STD (sexually transmitted disease) 04/20/2019   Recurrent boils 12/28/2018   Poorly controlled diabetes mellitus (HCC) 06/04/2013   Cushings syndrome 01/08/2013   PCOS (polycystic ovarian syndrome) 01/08/2013    Past Surgical History:  Procedure Laterality Date   ABSCESS DRAINAGE     THERAPEUTIC ABORTION     TOOTH EXTRACTION      OB History     Gravida  3   Para  1   Term  1   Preterm  0   AB  2   Living  1      SAB  1   IAB  1   Ectopic  0   Multiple  0   Live Births  1            Home Medications    Prior to Admission medications  Medication Sig Start Date End Date Taking? Authorizing Provider  promethazine -dextromethorphan (PROMETHAZINE -DM) 6.25-15 MG/5ML syrup Take 5 mLs by mouth 4 (four) times daily as needed for cough.  07/26/24  Yes Ball, Daielle Melcher  G, FNP  cyclobenzaprine  (FLEXERIL ) 10 MG tablet Take 1 tablet (10 mg total) by mouth 2 (two) times daily as needed for muscle spasms. 07/08/24   Reddick, Johnathan B, NP  diclofenac  (VOLTAREN ) 50 MG EC tablet Take 1 tablet (50 mg total) by mouth 2 (two) times daily. 07/08/24   Reddick, Johnathan B, NP  insulin  glargine (LANTUS  SOLOSTAR) 100 UNIT/ML Solostar Pen Inject into the skin. 01/18/23   [provider]  liraglutide (VICTOZA) 18 MG/3ML SOPN Inject into the skin. 01/18/23   [provider]  losartan (COZAAR) 25 MG tablet Take 25 mg by mouth daily. 09/09/21   [provider]  metFORMIN  (GLUCOPHAGE -XR) 500 MG 24 hr tablet Take 1,000 mg by mouth 2 (two) times daily. 09/09/21   [provider]    Family History Family History  Problem Relation Age of Onset   Epilepsy Father    Cancer Father        lung cancer   COPD Maternal Grandmother    Hypertension Maternal Grandmother    Heart disease Maternal Grandmother    Cancer Paternal Grandfather  Social History Social History[1]   Allergies   Lisinopril    Review of Systems Review of Systems  Per HPI  Physical Exam Triage Vital Signs ED Triage Vitals  Encounter Vitals Group     BP 07/26/24 1713 133/88     Girls Systolic BP Percentile --      Girls Diastolic BP Percentile --      Boys Systolic BP Percentile --      Boys Diastolic BP Percentile --      Pulse Rate 07/26/24 1713 100     Resp 07/26/24 1713 18     Temp 07/26/24 1713 99.9 F (37.7 C)     Temp Source 07/26/24 1713 Oral     SpO2 07/26/24 1713 97 %     Weight --      Height --      Head Circumference --      Peak Flow --      Pain Score 07/26/24 1712 7     Pain Loc --      Pain Education --      Exclude from Growth Chart --    No data found.  Updated Vital Signs BP 133/88 (BP Location: Right Arm)   Pulse 100   Temp 99.9 F (37.7 C) (Oral)   Resp 18   SpO2 97%   Visual Acuity Right Eye  Distance:   Left Eye Distance:   Bilateral Distance:    Right Eye Near:   Left Eye Near:    Bilateral Near:     Physical Exam Vitals and nursing note reviewed.  Constitutional:      Appearance: Normal appearance.  HENT:     Head: Normocephalic and atraumatic.     Right Ear: External ear normal.     Left Ear: External ear normal.     Nose: Nose normal.     Mouth/Throat:     Mouth: Mucous membranes are moist.     Pharynx: Posterior oropharyngeal erythema present.  Eyes:     Conjunctiva/sclera: Conjunctivae normal.  Cardiovascular:     Rate and Rhythm: Normal rate and regular rhythm.     Heart sounds: Normal heart sounds. No murmur heard. Pulmonary:     Effort: Pulmonary effort is normal. No respiratory distress.     Breath sounds: Normal breath sounds. No wheezing.  Skin:    General: Skin is warm and dry.  Neurological:     General: No focal deficit present.     Mental Status: She is alert.  Psychiatric:        Mood and Affect: Mood normal.      UC Treatments / Results  Labs (all labs ordered are listed, but only abnormal results are displayed) Labs Reviewed  POC SOFIA SARS ANTIGEN FIA  POCT INFLUENZA A/B    EKG   Radiology No results found.  Procedures Procedures (including critical care time)  Medications Ordered in UC Medications - No data to display  Initial Impression / Assessment and Plan / UC Course  I have reviewed the triage vital signs and the nursing notes.  Pertinent labs & imaging results that were available during my care of the patient were reviewed by me and considered in my medical decision making (see chart for details).  Notes and triage reviewed, patient is hemodynamically stable.  Lungs vesicular, heart with regular rate and rhythm.  Posterior pharynx erythema present.  COVID and flu testing negative, suspect other viral URI.  Symptomatic management discussed.  Plan of care, follow-up  care, and return precautions given, no questions  at this time.     Final Clinical Impressions(s) / UC Diagnoses   Final diagnoses:  Generalized body aches  Viral URI with cough     Discharge Instructions      COVID and flu testing were negative, you most likely have a different viral illness.  Alternate between 800 milligrams of ibuprofen  and 500 mg of Tylenol  every 4-6 hours to help with fever, body aches and chills.  Warm saline gargles, tea with honey and voice rest can help with voice loss and sore throat.  Use the cough syrup as needed, this may cause drowsiness.  Ensure you are staying well-hydrated and getting plenty of rest.  Viral illnesses typically last 5 to 7 days.  For any new concerning symptoms or prolonged symptoms seek follow-up care.     ED Prescriptions     Medication Sig Dispense Auth. Provider   promethazine -dextromethorphan (PROMETHAZINE -DM) 6.25-15 MG/5ML syrup Take 5 mLs by mouth 4 (four) times daily as needed for cough. 118 mL Ball, Sidnee Gambrill  G, FNP      PDMP not reviewed this encounter.     [1]  Social History Tobacco Use   Smoking status: Former    Current packs/day: 0.20    Types: Cigarettes   Smokeless tobacco: Never   Tobacco comments:    States she goes through about 1 pack every 5 days, working on cutting back  Vaping Use   Vaping status: Never Used  Substance Use Topics   Alcohol  use: Yes    Comment: occ   Drug use: Yes    Types: Marijuana    Comment: daily marijuana use     Mercer, Bertha Earwood  G, FNP 07/26/24 1814  "

## 2024-07-26 NOTE — Discharge Instructions (Signed)
 COVID and flu testing were negative, you most likely have a different viral illness.  Alternate between 800 milligrams of ibuprofen  and 500 mg of Tylenol  every 4-6 hours to help with fever, body aches and chills.  Warm saline gargles, tea with honey and voice rest can help with voice loss and sore throat.  Use the cough syrup as needed, this may cause drowsiness.  Ensure you are staying well-hydrated and getting plenty of rest.  Viral illnesses typically last 5 to 7 days.  For any new concerning symptoms or prolonged symptoms seek follow-up care.

## 2024-07-26 NOTE — ED Triage Notes (Signed)
 Pt c/o productive cough with yellow sputum, scratchy throat, loss of voice, and body aches since yesterday. States took nyquil last night with no relief.
